# Patient Record
Sex: Male | Born: 1960
Health system: Southern US, Community
[De-identification: ages and names within clinical notes are randomized; demographics above are authoritative.]

## PROBLEM LIST (undated history)

## (undated) ENCOUNTER — Ambulatory Visit: Admission: EM | Source: Ambulatory Visit

## (undated) DIAGNOSIS — F419 Anxiety disorder, unspecified: Secondary | ICD-10-CM

## (undated) DIAGNOSIS — I10 Essential (primary) hypertension: Secondary | ICD-10-CM

## (undated) DIAGNOSIS — U071 COVID-19: Secondary | ICD-10-CM

## (undated) DIAGNOSIS — R51 Headache: Secondary | ICD-10-CM

## (undated) DIAGNOSIS — E669 Obesity, unspecified: Secondary | ICD-10-CM

## (undated) DIAGNOSIS — M1990 Unspecified osteoarthritis, unspecified site: Secondary | ICD-10-CM

## (undated) DIAGNOSIS — N189 Chronic kidney disease, unspecified: Secondary | ICD-10-CM

## (undated) DIAGNOSIS — K219 Gastro-esophageal reflux disease without esophagitis: Secondary | ICD-10-CM

## (undated) DIAGNOSIS — F5104 Psychophysiologic insomnia: Secondary | ICD-10-CM

## (undated) DIAGNOSIS — J45909 Unspecified asthma, uncomplicated: Secondary | ICD-10-CM

## (undated) DIAGNOSIS — E7800 Pure hypercholesterolemia, unspecified: Secondary | ICD-10-CM

## (undated) HISTORY — DX: Anxiety disorder, unspecified: F41.9

## (undated) HISTORY — DX: Unspecified asthma, uncomplicated: J45.909

## (undated) HISTORY — DX: Psychophysiologic insomnia: F51.04

## (undated) HISTORY — DX: Pure hypercholesterolemia, unspecified: E78.00

## (undated) HISTORY — DX: Chronic kidney disease, unspecified: N18.9

## (undated) HISTORY — DX: Headache: R51

## (undated) HISTORY — DX: Unspecified osteoarthritis, unspecified site: M19.90

## (undated) HISTORY — DX: Gastro-esophageal reflux disease without esophagitis: K21.9

## (undated) HISTORY — DX: Essential (primary) hypertension: I10

## (undated) HISTORY — DX: Obesity, unspecified: E66.9

## (undated) HISTORY — PX: SHOULDER SURGERY: SHX246

---

## 1998-06-15 ENCOUNTER — Ambulatory Visit (HOSPITAL_COMMUNITY): Admission: RE | Admit: 1998-06-15 | Discharge: 1998-06-15 | Payer: Self-pay | Admitting: Specialist

## 1998-07-03 ENCOUNTER — Ambulatory Visit (HOSPITAL_COMMUNITY): Admission: RE | Admit: 1998-07-03 | Discharge: 1998-07-03 | Payer: Self-pay | Admitting: Specialist

## 1998-07-03 ENCOUNTER — Encounter: Payer: Self-pay | Admitting: Specialist

## 2003-10-17 ENCOUNTER — Encounter: Admission: RE | Admit: 2003-10-17 | Discharge: 2003-10-18 | Payer: Self-pay | Admitting: Pulmonary Disease

## 2004-10-26 ENCOUNTER — Ambulatory Visit: Payer: Self-pay | Admitting: Pulmonary Disease

## 2004-11-19 ENCOUNTER — Ambulatory Visit: Payer: Self-pay | Admitting: Pulmonary Disease

## 2004-12-24 ENCOUNTER — Ambulatory Visit: Payer: Self-pay | Admitting: Pulmonary Disease

## 2005-12-18 ENCOUNTER — Ambulatory Visit: Payer: Self-pay | Admitting: Pulmonary Disease

## 2007-01-29 ENCOUNTER — Ambulatory Visit: Payer: Self-pay | Admitting: Pulmonary Disease

## 2007-01-29 LAB — CONVERTED CEMR LAB
ALT: 30 units/L (ref 0–53)
AST: 26 units/L (ref 0–37)
Albumin: 4.2 g/dL (ref 3.5–5.2)
Alkaline Phosphatase: 48 units/L (ref 39–117)
BUN: 12 mg/dL (ref 6–23)
Basophils Absolute: 0.1 10*3/uL (ref 0.0–0.1)
Basophils Relative: 0.8 % (ref 0.0–1.0)
Bilirubin Urine: NEGATIVE
Bilirubin, Direct: 0.1 mg/dL (ref 0.0–0.3)
CO2: 31 meq/L (ref 19–32)
Calcium: 9.4 mg/dL (ref 8.4–10.5)
Chloride: 104 meq/L (ref 96–112)
Cholesterol: 176 mg/dL (ref 0–200)
Creatinine, Ser: 1 mg/dL (ref 0.4–1.5)
Eosinophils Absolute: 0.1 10*3/uL (ref 0.0–0.6)
Eosinophils Relative: 1.7 % (ref 0.0–5.0)
GFR calc Af Amer: 104 mL/min
GFR calc non Af Amer: 86 mL/min
Glucose, Bld: 118 mg/dL — ABNORMAL HIGH (ref 70–99)
HCT: 49.3 % (ref 39.0–52.0)
HDL: 45 mg/dL (ref >39.0)
Hemoglobin, Urine: NEGATIVE
Hemoglobin: 16.9 g/dL (ref 13.0–17.0)
Ketones, ur: NEGATIVE mg/dL
LDL Cholesterol: 118 mg/dL — ABNORMAL HIGH (ref 0–99)
Leukocytes, UA: NEGATIVE
Lymphocytes Relative: 24.5 % (ref 12.0–46.0)
MCHC: 34.2 g/dL (ref 30.0–36.0)
MCV: 94.3 fL (ref 78.0–100.0)
Monocytes Absolute: 0.6 10*3/uL (ref 0.2–0.7)
Monocytes Relative: 7.3 % (ref 3.0–11.0)
Neutro Abs: 5.2 10*3/uL (ref 1.4–7.7)
Neutrophils Relative %: 65.7 % (ref 43.0–77.0)
Nitrite: NEGATIVE
Platelets: 210 10*3/uL (ref 150–400)
Potassium: 4.5 meq/L (ref 3.5–5.1)
RBC: 5.23 M/uL (ref 4.22–5.81)
RDW: 11.8 % (ref 11.5–14.6)
Sodium: 141 meq/L (ref 135–145)
Specific Gravity, Urine: 1.015 (ref 1.000–1.03)
TSH: 0.78 microintl units/mL (ref 0.35–5.50)
Total Bilirubin: 0.9 mg/dL (ref 0.3–1.2)
Total CHOL/HDL Ratio: 3.9
Total Protein, Urine: NEGATIVE mg/dL
Total Protein: 7.1 g/dL (ref 6.0–8.3)
Triglycerides: 67 mg/dL (ref 0–149)
Urine Glucose: NEGATIVE mg/dL
Urobilinogen, UA: 0.2 (ref 0.0–1.0)
VLDL: 13 mg/dL (ref 0–40)
WBC: 7.9 10*3/uL (ref 4.5–10.5)
pH: 6 (ref 5.0–8.0)

## 2007-01-31 DIAGNOSIS — E669 Obesity, unspecified: Secondary | ICD-10-CM | POA: Insufficient documentation

## 2007-01-31 DIAGNOSIS — E785 Hyperlipidemia, unspecified: Secondary | ICD-10-CM | POA: Insufficient documentation

## 2007-01-31 DIAGNOSIS — J309 Allergic rhinitis, unspecified: Secondary | ICD-10-CM | POA: Insufficient documentation

## 2007-01-31 DIAGNOSIS — K219 Gastro-esophageal reflux disease without esophagitis: Secondary | ICD-10-CM | POA: Insufficient documentation

## 2007-01-31 DIAGNOSIS — R51 Headache: Secondary | ICD-10-CM | POA: Insufficient documentation

## 2007-01-31 DIAGNOSIS — R519 Headache, unspecified: Secondary | ICD-10-CM | POA: Insufficient documentation

## 2007-01-31 DIAGNOSIS — I10 Essential (primary) hypertension: Secondary | ICD-10-CM | POA: Insufficient documentation

## 2008-04-18 ENCOUNTER — Telehealth: Payer: Self-pay | Admitting: Pulmonary Disease

## 2008-05-16 ENCOUNTER — Telehealth: Payer: Self-pay | Admitting: Pulmonary Disease

## 2008-05-18 ENCOUNTER — Telehealth: Payer: Self-pay | Admitting: Pulmonary Disease

## 2008-05-19 ENCOUNTER — Ambulatory Visit: Payer: Self-pay | Admitting: Pulmonary Disease

## 2008-05-27 ENCOUNTER — Ambulatory Visit: Payer: Self-pay | Admitting: Pulmonary Disease

## 2008-05-27 DIAGNOSIS — J209 Acute bronchitis, unspecified: Secondary | ICD-10-CM | POA: Insufficient documentation

## 2008-05-28 DIAGNOSIS — G4700 Insomnia, unspecified: Secondary | ICD-10-CM | POA: Insufficient documentation

## 2008-05-28 LAB — CONVERTED CEMR LAB
ALT: 30 units/L (ref 0–53)
AST: 17 units/L (ref 0–37)
Albumin: 3.6 g/dL (ref 3.5–5.2)
Alkaline Phosphatase: 53 units/L (ref 39–117)
BUN: 15 mg/dL (ref 6–23)
BUN: 13 mg/dL (ref 6–23)
Bacteria, UA: NEGATIVE
Basophils Absolute: 0 10*3/uL (ref 0.0–0.1)
Basophils Relative: 0 % (ref 0.0–3.0)
Bilirubin Urine: NEGATIVE
Bilirubin, Direct: 0.1 mg/dL (ref 0.0–0.3)
CO2: 34 meq/L — ABNORMAL HIGH (ref 19–32)
CO2: 33 meq/L — ABNORMAL HIGH (ref 19–32)
Calcium: 8.7 mg/dL (ref 8.4–10.5)
Calcium: 9.6 mg/dL (ref 8.4–10.5)
Chloride: 100 meq/L (ref 96–112)
Chloride: 101 meq/L (ref 96–112)
Cholesterol: 171 mg/dL (ref 0–200)
Creatinine, Ser: 1.1 mg/dL (ref 0.4–1.5)
Creatinine, Ser: 1.2 mg/dL (ref 0.4–1.5)
Crystals: NEGATIVE
Direct LDL: 97.5 mg/dL
Eosinophils Absolute: 0.2 10*3/uL (ref 0.0–0.7)
Eosinophils Relative: 1.6 % (ref 0.0–5.0)
GFR calc Af Amer: 93 mL/min
GFR calc Af Amer: 84 mL/min
GFR calc non Af Amer: 77 mL/min
GFR calc non Af Amer: 69 mL/min
Glucose, Bld: 225 mg/dL — ABNORMAL HIGH (ref 70–99)
Glucose, Bld: 122 mg/dL — ABNORMAL HIGH (ref 70–99)
HCT: 48 % (ref 39.0–52.0)
HDL: 47.8 mg/dL (ref >39.0)
Hemoglobin, Urine: NEGATIVE
Hemoglobin: 16.7 g/dL (ref 13.0–17.0)
Hgb A1c MFr Bld: 6.6 % — ABNORMAL HIGH (ref 4.6–6.0)
Ketones, ur: NEGATIVE mg/dL
Leukocytes, UA: NEGATIVE
Lymphocytes Relative: 27.9 % (ref 12.0–46.0)
MCHC: 34.8 g/dL (ref 30.0–36.0)
MCV: 94.7 fL (ref 78.0–100.0)
Monocytes Absolute: 0.7 10*3/uL (ref 0.1–1.0)
Monocytes Relative: 6 % (ref 3.0–12.0)
Neutro Abs: 8 10*3/uL — ABNORMAL HIGH (ref 1.4–7.7)
Neutrophils Relative %: 64.5 % (ref 43.0–77.0)
Nitrite: NEGATIVE
PSA: 0.51 ng/mL (ref 0.10–4.00)
Platelets: 207 10*3/uL (ref 150–400)
Potassium: 3.1 meq/L — ABNORMAL LOW (ref 3.5–5.1)
Potassium: 4.3 meq/L (ref 3.5–5.1)
RBC: 5.07 M/uL (ref 4.22–5.81)
RDW: 11.8 % (ref 11.5–14.6)
Sodium: 141 meq/L (ref 135–145)
Sodium: 141 meq/L (ref 135–145)
Specific Gravity, Urine: >=1.03 (ref 1.000–1.035)
Squamous Epithelial / HPF: NEGATIVE /lpf
TSH: 2.2 microintl units/mL (ref 0.35–5.50)
Total Bilirubin: 0.7 mg/dL (ref 0.3–1.2)
Total CHOL/HDL Ratio: 3.6
Total Protein, Urine: 30 mg/dL — AB
Total Protein: 6.3 g/dL (ref 6.0–8.3)
Triglycerides: 231 mg/dL (ref 0–149)
Urine Glucose: 250 mg/dL — AB
Urobilinogen, UA: 1 (ref 0.0–1.0)
VLDL: 46 mg/dL — ABNORMAL HIGH (ref 0–40)
WBC: 12.4 10*3/uL — ABNORMAL HIGH (ref 4.5–10.5)
pH: 6 (ref 5.0–8.0)

## 2008-06-21 ENCOUNTER — Ambulatory Visit: Payer: Self-pay | Admitting: Pulmonary Disease

## 2008-10-28 ENCOUNTER — Telehealth: Payer: Self-pay | Admitting: Pulmonary Disease

## 2009-05-10 ENCOUNTER — Telehealth (INDEPENDENT_AMBULATORY_CARE_PROVIDER_SITE_OTHER): Payer: Self-pay | Admitting: *Deleted

## 2009-05-12 ENCOUNTER — Ambulatory Visit: Payer: Self-pay | Admitting: Pulmonary Disease

## 2009-05-13 DIAGNOSIS — M1990 Unspecified osteoarthritis, unspecified site: Secondary | ICD-10-CM | POA: Insufficient documentation

## 2009-05-13 LAB — CONVERTED CEMR LAB
ALT: 29 units/L (ref 0–53)
AST: 27 units/L (ref 0–37)
Albumin: 4.1 g/dL (ref 3.5–5.2)
Alkaline Phosphatase: 50 units/L (ref 39–117)
BUN: 11 mg/dL (ref 6–23)
Basophils Absolute: 0 10*3/uL (ref 0.0–0.1)
Basophils Relative: 0.5 % (ref 0.0–3.0)
Bilirubin Urine: NEGATIVE
Bilirubin, Direct: 0.3 mg/dL (ref 0.0–0.3)
CO2: 29 meq/L (ref 19–32)
Calcium: 9.3 mg/dL (ref 8.4–10.5)
Chloride: 103 meq/L (ref 96–112)
Cholesterol: 156 mg/dL (ref 0–200)
Creatinine, Ser: 1.1 mg/dL (ref 0.4–1.5)
Eosinophils Absolute: 0.1 10*3/uL (ref 0.0–0.7)
Eosinophils Relative: 2 % (ref 0.0–5.0)
GFR calc non Af Amer: 76.2 mL/min (ref >60)
Glucose, Bld: 122 mg/dL — ABNORMAL HIGH (ref 70–99)
HCT: 46.4 % (ref 39.0–52.0)
HDL: 47.5 mg/dL (ref >39.00)
Hemoglobin, Urine: NEGATIVE
Hemoglobin: 16.1 g/dL (ref 13.0–17.0)
Hgb A1c MFr Bld: 6 % (ref 4.6–6.5)
Ketones, ur: NEGATIVE mg/dL
LDL Cholesterol: 100 mg/dL — ABNORMAL HIGH (ref 0–99)
Leukocytes, UA: NEGATIVE
Lymphocytes Relative: 31.5 % (ref 12.0–46.0)
Lymphs Abs: 1.7 10*3/uL (ref 0.7–4.0)
MCHC: 34.6 g/dL (ref 30.0–36.0)
MCV: 95.9 fL (ref 78.0–100.0)
Monocytes Absolute: 0.5 10*3/uL (ref 0.1–1.0)
Monocytes Relative: 9.5 % (ref 3.0–12.0)
Neutro Abs: 3.2 10*3/uL (ref 1.4–7.7)
Neutrophils Relative %: 56.5 % (ref 43.0–77.0)
Nitrite: NEGATIVE
PSA: 0.8 ng/mL (ref 0.10–4.00)
Platelets: 185 10*3/uL (ref 150.0–400.0)
Potassium: 4.3 meq/L (ref 3.5–5.1)
RBC: 4.84 M/uL (ref 4.22–5.81)
RDW: 11.9 % (ref 11.5–14.6)
Sodium: 140 meq/L (ref 135–145)
Specific Gravity, Urine: 1.025 (ref 1.000–1.030)
TSH: 0.74 microintl units/mL (ref 0.35–5.50)
Total Bilirubin: 0.9 mg/dL (ref 0.3–1.2)
Total CHOL/HDL Ratio: 3
Total Protein, Urine: NEGATIVE mg/dL
Total Protein: 7.1 g/dL (ref 6.0–8.3)
Triglycerides: 42 mg/dL (ref 0.0–149.0)
Urine Glucose: NEGATIVE mg/dL
Urobilinogen, UA: 1 (ref 0.0–1.0)
VLDL: 8.4 mg/dL (ref 0.0–40.0)
WBC: 5.5 10*3/uL (ref 4.5–10.5)
pH: 6 (ref 5.0–8.0)

## 2009-05-25 ENCOUNTER — Telehealth (INDEPENDENT_AMBULATORY_CARE_PROVIDER_SITE_OTHER): Payer: Self-pay | Admitting: *Deleted

## 2009-07-05 ENCOUNTER — Telehealth: Payer: Self-pay | Admitting: Pulmonary Disease

## 2009-07-07 ENCOUNTER — Telehealth: Payer: Self-pay | Admitting: Pulmonary Disease

## 2009-12-22 ENCOUNTER — Telehealth: Payer: Self-pay | Admitting: Pulmonary Disease

## 2009-12-27 ENCOUNTER — Ambulatory Visit: Payer: Self-pay | Admitting: Pulmonary Disease

## 2010-01-02 ENCOUNTER — Ambulatory Visit: Payer: Self-pay | Admitting: Pulmonary Disease

## 2010-01-02 LAB — CONVERTED CEMR LAB
ALT: 28 units/L (ref 0–53)
AST: 25 units/L (ref 0–37)
Albumin: 4.2 g/dL (ref 3.5–5.2)
Alkaline Phosphatase: 50 units/L (ref 39–117)
BUN: 19 mg/dL (ref 6–23)
Basophils Absolute: 0 10*3/uL (ref 0.0–0.1)
Basophils Relative: 0.6 % (ref 0.0–3.0)
Bilirubin, Direct: 0.1 mg/dL (ref 0.0–0.3)
CO2: 29 meq/L (ref 19–32)
Calcium: 9.3 mg/dL (ref 8.4–10.5)
Chloride: 106 meq/L (ref 96–112)
Cholesterol: 164 mg/dL (ref 0–200)
Creatinine, Ser: 1.2 mg/dL (ref 0.4–1.5)
Eosinophils Absolute: 0.1 10*3/uL (ref 0.0–0.7)
Eosinophils Relative: 1.4 % (ref 0.0–5.0)
GFR calc non Af Amer: 70.08 mL/min (ref >60)
Glucose, Bld: 97 mg/dL (ref 70–99)
HCT: 46.4 % (ref 39.0–52.0)
HDL: 40.9 mg/dL (ref >39.00)
Hemoglobin: 15.9 g/dL (ref 13.0–17.0)
LDL Cholesterol: 101 mg/dL — ABNORMAL HIGH (ref 0–99)
Lymphocytes Relative: 33.2 % (ref 12.0–46.0)
Lymphs Abs: 2.4 10*3/uL (ref 0.7–4.0)
MCHC: 34.4 g/dL (ref 30.0–36.0)
MCV: 95.6 fL (ref 78.0–100.0)
Monocytes Absolute: 0.7 10*3/uL (ref 0.1–1.0)
Monocytes Relative: 9.8 % (ref 3.0–12.0)
Neutro Abs: 3.9 10*3/uL (ref 1.4–7.7)
Neutrophils Relative %: 55 % (ref 43.0–77.0)
PSA: 0.63 ng/mL (ref 0.10–4.00)
Platelets: 204 10*3/uL (ref 150.0–400.0)
Potassium: 5.1 meq/L (ref 3.5–5.1)
RBC: 4.85 M/uL (ref 4.22–5.81)
RDW: 12.8 % (ref 11.5–14.6)
Sodium: 142 meq/L (ref 135–145)
TSH: 1.08 microintl units/mL (ref 0.35–5.50)
Total Bilirubin: 0.7 mg/dL (ref 0.3–1.2)
Total CHOL/HDL Ratio: 4
Total Protein: 6.8 g/dL (ref 6.0–8.3)
Triglycerides: 113 mg/dL (ref 0.0–149.0)
VLDL: 22.6 mg/dL (ref 0.0–40.0)
WBC: 7.1 10*3/uL (ref 4.5–10.5)

## 2010-04-24 NOTE — Progress Notes (Signed)
Summary: set up labs  Phone Note Call from Patient   Caller: Spouse-rene Tye Call For: nadel Summary of Call: wants labs set up so pt can have these done next week. [phone] Initial call taken by: Tivis Ringer, CNA,  December 22, 2009 10:46 AM  Follow-up for Phone Call        called and spoke with pt.  pt is scheduled to see SN for a f/u appt on 01/02/2010.  Pt would like to come in next Wed.  12/27/2009 to have bloodwork drawn.  Please advise if ok or not.  Pt stated it is ok to leave message on his voicemail.  Aundra Millet Reynolds LPN  December 22, 2009 10:59 AM   Additional Follow-up for Phone Call Additional follow up Details #1::        ok per SN---for pt to have labs---these are in the computer for pt for next wed   12-27-09.  pt is aware Randell Loop [facility]  December 22, 2009 12:14 PM

## 2010-04-24 NOTE — Assessment & Plan Note (Signed)
Summary: cpx/fasting/apc   CC:  Yearly CPX & f/u medical problems....  History of Present Illness: 50 y/o WM, husb of Devin Roberts, here for a follow up visit & CPX...    ~  BJY78:  seen w/ HBP and Lisinopril/Hct was increased to 20-12.5 for BP of 160/100, LDL was 118 on Zocor80/d, weight= 255#/ BMI= 38 & diet + exercise stressed to the pt...   ~  May 27, 2008:  he states that he's been treated by an [facility] for bronchitis x 81month w/ cough (dry- no sputum), coughed so hard he had streaky hemoptysis x1, denies congestion/ drainage/ URI symptoms, denies f/c/s, notes chest sore from coughing & incr SOB... given Omnicef & Pred but no better he says... he is still taking Lisinopril/Hct for his BP- we stopped the Lisinopril (ACE cough), stopped the Pred (BS was 225) & started Exforge 5-160 for the BP...  ~  June 21, 2008:  he reports much improved off the ACE & on the Exforge 5-160 daily... cough is gone and BP= 142/82... weight 253# is down 15# in 3 weeks!!! [name]!!!... fingerstick BS= 110, prev A1c= 6.6.Marland Kitchen.    ~  May 12, 2009:  Yearly CPX feeling well- no new complaints or concerns... brings note from wife- wants him to have ZPak & Tussionex on hand, wants CXR & EKG & lab work faxed to her at [phone]... his BP is borderline here but he thinks "white coat" & reports BP OK at home by wife... Chol & BS look good on current meds.    Current Problems:   ALLERGIC RHINITIS (ICD-477.9) - he uses ALLEGRA 180mg /d as needed...  ASTHMATIC BRONCHITIS, ACUTE (ICD-466.0) - he has had exercise-induced asthma in the past... no regular meds required... he denies cough, phlegm, dyspnea, CP, etc...  ~  2/10:  treated at [facility] for "asthmatic bronchits" w/ Omnicef & Pred... BS incr to 225, & cough did not resolve... Lisinopril was stopped w/ resolution of cough.  HYPERTENSION (ICD-401.9) - on AMLODIPINE 5mg /d, & DIOVAN 160mg /d... prev Lisinopril stopped in 2010 due to ACE cough & given Exforge w/ control of BP  but insurance wouldn't cover it- swatched to Diovan+Amlodipine... BP= 160/90 today but he thinks "white coat" w/ better BP checks at home by wife... tolerates meds well- denies HA, fatigue, visual changes, CP, palipit, dizziness, syncope, dyspnea, edema, etc...   ~  2/11: rec> continue current doses, monitor BP at home, call if BP >150/90 so we can adjust doses.  HYPERCHOLESTEROLEMIA (ICD-272.0) - on PRAVASTATIN 40mg /d since 2009 (prev on Simva80 but intol w/ leg cramps).  ~  FLP 8/06 on diet showed TChol 232, TG 117, HDL28, LDL 168... Simva80 started.  ~  FLP 9/07 on Simva80 showed TChol 149, TG 41, HDL 43, LDL 98  ~  FLP 11/08 on Simva80 showed TChol 176, TG 67, HDL 45, LDL 118  ~  FLP 2/10 on Prav40 showed TChol 171, TG 231, HDL 48, LDL 98... rec- same med, better low fat diet.  ~  FLP 2/11 on Prav40showed TChol 156, TG 42, HDL 48, LDL 100  DIABETES MELLITUS (ICD-250.00) - new dx 2/10 w/ labs showing FBS= 225 (on Pred from [facility])... recheck off Pred 3/10 showed BS= 122, HgA1c= 6.6.Marland KitchenMarland Kitchen rec- diet + exercise, get weight down!  ~  NOTE:  prev FBS's from 2000-2008 were 95 - 131...  ~  labs 2/11 showed BS= 122, A1c= 6.0.Marland KitchenMarland Kitchen needs weight reduction.  OBESITY (ICD-278.00) - he was referred to [facility]  in 2005 for diet counselling, but he never went...  ~  11/08 weight = 255#,  5\' 8"  tall,  BMI= 38...  ~  3/10 weight = 268# & lost to 253# in 3 weeks!  ~  2/11 weight = 253#... he needs to do better!  GERD (ICD-530.81) - hx reflux symptoms in the past... prev Rx w/ OTC antacids, H2 blockers, etc... he saw DrByers in 2002 for hoarseness- believed related to reflux... no previous work up and no active symptoms at present...  DEGENERATIVE JOINT DISEASE (ICD-715.90) - he's had pain in shoulders & takes IBUPROFEN 600mg  Tid...  HEADACHE (ICD-784.0) - he saw DrAdelman in 1990 for muscle contraction HA's...  INSOMNIA, CHRONIC (ICD-307.42) - he requires AMBIEN CR 12.5mg  for  rest...   Allergies: 1)  ! Zocor (Simvastatin) 2)  ! Lisinopril (Lisinopril)  Comments:  Nurse/Medical Assistant: The patient's medications and allergies were reviewed with the patient and were updated in the Medication and Allergy Lists.  Past History:  Past Medical History:  ALLERGIC RHINITIS (ICD-477.9) ASTHMATIC BRONCHITIS, ACUTE (ICD-466.0) HYPERTENSION (ICD-401.9) HYPERCHOLESTEROLEMIA (ICD-272.0) DIABETES MELLITUS (ICD-250.00) OBESITY (ICD-278.00) GERD (ICD-530.81) DEGENERATIVE JOINT DISEASE (ICD-715.90) HEADACHE (ICD-784.0) INSOMNIA, CHRONIC (ICD-307.42)  Family History: Reviewed history from 05/27/2008 and no changes required. Father died age 3 w/ lung cancer, hx COPD... Mother alive age 61 w/ hyperchol 1 Sibling- sister w/ allergies & HA's  Social History: Reviewed history from 05/27/2008 and no changes required. Married- wife Devin Roberts, 49yrs no children never smoked no alcohol works for [employer]  Review of Systems  The patient denies fever, chills, sweats, anorexia, fatigue, weakness, malaise, weight loss, sleep disorder, blurring, diplopia, eye irritation, eye discharge, vision loss, eye pain, photophobia, earache, ear discharge, tinnitus, decreased hearing, nasal congestion, nosebleeds, sore throat, hoarseness, chest pain, palpitations, syncope, dyspnea on exertion, orthopnea, PND, peripheral edema, cough, dyspnea at rest, excessive sputum, hemoptysis, wheezing, pleurisy, nausea, vomiting, diarrhea, constipation, change in bowel habits, abdominal pain, melena, hematochezia, jaundice, gas/bloating, indigestion/heartburn, dysphagia, odynophagia, dysuria, hematuria, urinary frequency, urinary hesitancy, nocturia, incontinence, back pain, joint pain, joint swelling, muscle cramps, muscle weakness, stiffness, arthritis, sciatica, restless legs, leg pain at night, leg pain with exertion, rash, itching, dryness, suspicious lesions, paralysis, paresthesias, seizures,  tremors, vertigo, transient blindness, frequent falls, frequent headaches, difficulty walking, depression, anxiety, memory loss, confusion, cold intolerance, heat intolerance, polydipsia, polyphagia, polyuria, unusual weight change, abnormal bruising, bleeding, enlarged lymph nodes, urticaria, allergic rash, hay fever, and recurrent infections.    Vital Signs:  Patient profile:   50 year old male Height:      68 inches Weight:      253 pounds BMI:     38.61 O2 Sat:      97 % on Room air Temp:     97.8 degrees F oral Pulse rate:   70 / minute BP sitting:   160 / 90  (left arm) Cuff size:   regular  Vitals Entered By: Randell Loop CMA (May 12, 2009 10:35 AM)  O2 Sat at Rest %:  97 O2 Flow:  Room air CC: Yearly CPX & f/u medical problems... Is Patient Diabetic? No Pain Assessment Patient in pain? no      Comments MEDS UPDATED TODAY   Physical Exam  Additional Exam:  WD, Obese, 50 y/o WM in NAD... GENERAL:  Alert & oriented; pleasant & cooperative... HEENT:  [place]/AT, EOM-wnl, PERRLA, EACs-clear, TMs-wnl, NOSE-clear, THROAT-clear & wnl. NECK:  Supple w/ full ROM; no JVD; normal carotid impulses w/o bruits; no thyromegaly or nodules palpated;  no lymphadenopathy. CHEST:  Clear to P & A; without wheezes/ rales/ or rhonchi. HEART:  Regular Rhythm; without murmurs/ rubs/ or gallops. ABDOMEN:  Soft & nontender; normal bowel sounds; no organomegaly or masses detected. EXT: without deformities or arthritic changes; no varicose veins/ venous insuffic/ or edema. NEURO:  CN's intact;  no focal neuro deficits... DERM:  No lesions noted; no rash etc...    CXR  Procedure date:  05/12/2009  Findings:      CHEST - 2 VIEW Comparison: 05/27/2008   Findings: The cardiac silhouette, mediastinal and hilar contours are within normal limits and stable. The lungs are clear.  No pleural effusions. The bony thorax is intact.   IMPRESSION: Normal chest x-ray.  No change since prior  study.   Read By:  Cyndie Chime,  M.D.   EKG  Procedure date:  05/12/2009  Findings:      Normal sinus rhythm with rate of:  64/min... Tracing is WNL, NAD...  SN   MISC. Report  Procedure date:  05/12/2009  Findings:      Lipid Panel (LIPID)   Cholesterol               156 mg/dL                   1-610   Triglycerides             42.0 mg/dL                  9.6-045.4   HDL                       09.81 mg/dL                 >19.14   LDL Cholesterol      [H]  782 mg/dL                   9-56  Hepatic/Liver Function Panel (HEPATIC)   Total Bilirubin           0.9 mg/dL                   2.1-3.0   Direct Bilirubin          0.3 mg/dL                   8.6-5.7   Alkaline Phosphatase      50 U/L                      39-117   AST                       27 U/L                      0-37   ALT                       29 U/L                      0-53   Total Protein             7.1 g/dL                    8.4-6.9   Albumin  4.1 g/dL                    1.6-1.0  BMP (METABOL)   Sodium                    140 mEq/L                   135-145   Potassium                 4.3 mEq/L                   3.5-5.1   Chloride                  103 mEq/L                   96-112   Carbon Dioxide            29 mEq/L                    19-32   Glucose              [H]  122 mg/dL                   96-04   BUN                       11 mg/dL                    5-40   Creatinine                1.1 mg/dL                   9.8-1.1   Calcium                   9.3 mg/dL                   9.1-47.8   GFR                       76.20 mL/min                >60  Tests: (3) Hemoglobin A1C (A1C)   Hemoglobin A1C            6.0 %                       4.6-6.5  Comments:      TSH (TSH)   FastTSH                   0.74 uIU/mL                 0.35-5.50  CBC Platelet w/Diff (CBCD)   White Cell Count          5.5 K/uL                    4.5-10.5   Red Cell Count            4.84 Mil/uL                  4.22-5.81   Hemoglobin                16.1 g/dL  13.0-17.0   Hematocrit                46.4 %                      39.0-52.0   MCV                       95.9 fl                     78.0-100.0   Platelet Count            185.0 K/uL                  150.0-400.0   Neutrophil %              56.5 %                      43.0-77.0   Lymphocyte %              31.5 %                      12.0-46.0   Monocyte %                9.5 %                       3.0-12.0   Eosinophils%              2.0 %                       0.0-5.0   Basophils %               0.5 %                       0.0-3.0  UDip Only (UDIP)   Color                     YELLOW   Clarity                   CLEAR                       Clear   Specific Gravity          1.025                       1.000 - 1.030   Urine Ph                  6.0                         5.0-8.0   Protein                   NEGATIVE                    Negative   Urine Glucose             NEGATIVE                    Negative   Ketones                   NEGATIVE  Negative   Urine Bilirubin           NEGATIVE                    Negative   Blood                     NEGATIVE                    Negative   Urobilinogen              1.0                         0.0 - 1.0   Leukocyte Esterace        NEGATIVE                    Negative   Nitrite                   NEGATIVE                    Negative      Impression & Recommendations:  Problem # 1:  PHYSICAL EXAMINATION (ICD-V70.0)  Orders: EKG w/ Interpretation (93000) T-2 View CXR (71020TC) Full labs done FASTING prior to OV...  Problem # 2:  ASTHMATIC BRONCHITIS, ACUTE (ICD-466.0) No prob-  breathing normally... wife wants ZPak & Tussionex just in case... His updated medication list for this problem includes:    Zithromax Z-pak 250 Mg Tabs (Azithromycin) .Marland Kitchen... Take as directed...    Tussionex Pennkinetic Er 8-10 Mg/47ml Lqcr (Chlorpheniramine-hydrocodone) .Marland Kitchen... 1 tsp every  12 h as needed for cough  Problem # 3:  HYPERTENSION (ICD-401.9) Borderline control-  her understands that he must lose weight... continue current Rx, monitor BP at home, call for incr doses if BP >150/90. His updated medication list for this problem includes:    Amlodipine Besylate 5 Mg Tabs (Amlodipine besylate) .Marland Kitchen... Take one tablet by mouth once daily    Diovan 160 Mg Tabs (Valsartan) .Marland Kitchen... Take 1 tablet by mouth once a day  Problem # 4:  HYPERCHOLESTEROLEMIA (ICD-272.0) Improved & satis on the Prav40 & tol well... His updated medication list for this problem includes:    Pravastatin Sodium 40 Mg Tabs (Pravastatin sodium) .Marland Kitchen... Take one tablet by mouth at bedtime  Problem # 5:  DIABETES MELLITUS (ICD-250.00) Improved & OK on diet alone... needs to get weight down!!! His updated medication list for this problem includes:    Diovan 160 Mg Tabs (Valsartan) .Marland Kitchen... Take 1 tablet by mouth once a day  Problem # 6:  OBESITY (ICD-278.00) Weoght reduction is key!!!  Problem # 7:  OTHER MEDICAL PROBLEMS AS NOTED>>> Meds refilled per request...  Complete Medication List: 1)  Allegra 180 Mg Tabs (Fexofenadine hcl) .... Take 1 tab by mouth once daily as needed for allergies.Marland KitchenMarland Kitchen 2)  Amlodipine Besylate 5 Mg Tabs (Amlodipine besylate) .... Take one tablet by mouth once daily 3)  Diovan 160 Mg Tabs (Valsartan) .... Take 1 tablet by mouth once a day 4)  Pravastatin Sodium 40 Mg Tabs (Pravastatin sodium) .... Take one tablet by mouth at bedtime 5)  Ibuprofen 600 Mg Tabs (Ibuprofen) .... Take 1 tab by mouth up to three times daily w/ food as needed for shoulder pain.Marland KitchenMarland Kitchen 6)  Ambien Cr 12.5 Mg Cr-tabs (Zolpidem tartrate) .... Take 1 tab by mouth at bedtime as needed for insomnia.Marland KitchenMarland Kitchen  7)  Zithromax Z-pak 250 Mg Tabs (Azithromycin) .... Take as directed... 8)  Tussionex Pennkinetic Er 8-10 Mg/71ml Lqcr (Chlorpheniramine-hydrocodone) .Marland Kitchen.. 1 tsp every 12 h as needed for cough  Other Orders: Prescription  Created Electronically [id number])  Patient Instructions: 1)  Today we updated your med list- see below.... 2)  We refilled your meds per request & included ZPak & Tussionex for URI & Cough... 3)  Today we did your follow up CXR, EKG, & fasting blood work... we will fax copies to you per your request... 4)  Call for any questions.Marland KitchenMarland Kitchen 5)  [place], you need to get on track w/ your diet & exercise program... the goal is to lose 15-20 lbs... 6)  Please schedule a follow-up appointment in 1 year, sooner as needed. Prescriptions: TUSSIONEX PENNKINETIC ER 8-10 MG/5ML LQCR (CHLORPHENIRAMINE-HYDROCODONE) 1 tsp every 12 H as needed for cough  #4 oz x 2   Entered and Authorized by:   Michele Mcalpine MD   Signed by:   Michele Mcalpine MD on 05/12/2009   Method used:   Print then Give to Patient   RxID:   [id number] ZITHROMAX Z-PAK 250 MG TABS (AZITHROMYCIN) take as directed...  #1 pack x 2   Entered and Authorized by:   Michele Mcalpine MD   Signed by:   Michele Mcalpine MD on 05/12/2009   Method used:   Print then Give to Patient   RxID:   [id number] AMLODIPINE BESYLATE 5 MG TABS (AMLODIPINE BESYLATE) take one tablet by mouth once daily  #30 x prn   Entered and Authorized by:   Michele Mcalpine MD   Signed by:   Michele Mcalpine MD on 05/12/2009   Method used:   Print then Give to Patient   RxID:   [id number] AMBIEN CR 12.5 MG CR-TABS (ZOLPIDEM TARTRATE) take 1 tab by mouth at bedtime as needed for insomnia...  #30 x prn   Entered and Authorized by:   Michele Mcalpine MD   Signed by:   Michele Mcalpine MD on 05/12/2009   Method used:   Print then Give to Patient   RxID:   [id number] IBUPROFEN 600 MG TABS (IBUPROFEN) take 1 tab by mouth up to three times daily w/ food as needed for shoulder pain...  #100 x prn   Entered and Authorized by:   Michele Mcalpine MD   Signed by:   Michele Mcalpine MD on 05/12/2009   Method used:   Print then Give to Patient   RxID:   [id number] PRAVASTATIN SODIUM 40 MG  TABS (PRAVASTATIN SODIUM) take one tablet by mouth at bedtime  #30 x prn   Entered and Authorized by:   Michele Mcalpine MD   Signed by:   Michele Mcalpine MD on 05/12/2009   Method used:   Print then Give to Patient   RxID:   [id number] DIOVAN 160 MG TABS (VALSARTAN) Take 1 tablet by mouth once a day  #30 x prn   Entered and Authorized by:   Michele Mcalpine MD   Signed by:   Michele Mcalpine MD on 05/12/2009   Method used:   Print then Give to Patient   RxID:   [id number] ALLEGRA 180 MG  TABS (FEXOFENADINE HCL) take 1 tab by mouth once daily as needed for allergies...  #30 x prn   Entered and Authorized by:   Michele Mcalpine MD   Signed by:   Lonzo Cloud  Kriste Basque MD on 05/12/2009   Method used:   Print then Give to Patient   RxID:   [id number]    CardioPerfect ECG  ID: [id number] Patient: KUPONO, MARLING DOB: 08/04/1960 Age: 50 Years Old Sex: Male Race: White Physician: scott nadel Technician: Randell Loop CMA Height: 68 Weight: 253 Status: Unconfirmed Past Medical History:  ALLERGIC RHINITIS (ICD-477.9) ASTHMATIC BRONCHITIS, ACUTE (ICD-466.0) HYPERTENSION (ICD-401.9) HYPERCHOLESTEROLEMIA (ICD-272.0) DIABETES MELLITUS (ICD-250.00) OBESITY (ICD-278.00) GERD (ICD-530.81) HEADACHE (ICD-784.0) INSOMNIA, CHRONIC (ICD-307.42)   Recorded: 05/12/2009 10:54 AM P/PR: 110 ms / 178 ms - Heart rate (maximum exercise) QRS: 79 QT/QTc/QTd: 385 ms / 390 ms / 30 ms - Heart rate (maximum exercise)  P/QRS/T axis: 30 deg / 45 deg / 56 deg - Heart rate (maximum exercise)  Heartrate: 63 bpm  Interpretation:  Normal sinus rhythm with rate of:  64/min... Tracing is WNL, NAD...  SN

## 2010-04-24 NOTE — Progress Notes (Signed)
Summary: set up labs  Phone Note Call from Patient Call back at Home Phone [phone]   Caller: Patient Call For: nadel Summary of Call: pt wants to make sure labs are set up so he can come in fri at 7:30.  Initial call taken by: Tivis Ringer, CNA,  May 10, 2009 8:48 AM  Follow-up for Phone Call        Pt want to get labs drawn on friday morning for upcoming CPX. Please advise what labs to place. Thanks. Carron Curie CMA  May 10, 2009 8:56 AM  labs placed in IDX for friday morning. Boone Master CNA  May 10, 2009 9:05 AM   Additional Follow-up for Phone Call Additional follow up Details #1::        [facility] letting patient know labs have been ordered for friday morning and to come fasting.Michel Bickers [facility]  May 10, 2009 9:19 AM

## 2010-04-24 NOTE — Progress Notes (Signed)
Summary: sick  Phone Note Call from Patient Call back at [facility] Phone [phone]   Caller: Patient Call For: nadel Reason for Call: Talk to Nurse Summary of Call: pt temp 100.2, diarrhea.  Has taken imodium and tylenol.Marland KitchenMarland KitchenDoes he need to take anything else?  Still has Diarrhea. Initial call taken by: Eugene Gavia,  July 05, 2009 8:20 AM  Follow-up for Phone Call        The pt c/o temp yesterday and diarrhea that has improved this morning with Immodium. Pt having 3-4 loose BM's about every 3 hours or so. No nausea vomiting or severe stomach cramping. Please advise.Michel Bickers [facility]  July 05, 2009 10:06 AM  Additional Follow-up for Phone Call Additional follow up Details #1::        per SN---rec use the align once daily and activa yogurt daily----called and spoke with pt and he is aware of SN recs.  will try this and if any other problems to call back. Randell Loop CMA  July 05, 2009 11:15 AM

## 2010-04-24 NOTE — Assessment & Plan Note (Signed)
Summary: f/u ///kp   CC:  8 month ROV & review of mult medical problems....  History of Present Illness: 50 y/o WM, husb of Krayton Wortley, here for a follow up visit...   ~  ZOX09:  seen w/ HBP and his Lisinopril/Hct was increased to 20-12.5 for BP of 160/100, LDL was 118 on Zocor80/d, weight= 255#/ BMI= 38 & diet + exercise stressed to the pt...   ~  Mar10:  he states that he's been treated by an [facility] for bronchitis x 34month w/ cough (dry- no sputum), coughed so hard he had streaky hemoptysis x1, denies congestion/ drainage/ URI symptoms, denies f/c/s, notes chest sore from coughing & incr SOB... given Omnicef & Pred but no better he says... he is still taking Lisinopril/Hct for his BP- we stopped the Lisinopril (ACE cough), stopped the Pred (BS was 225) & started Exforge 5-160 for the BP...  ~  Mar10:  he reports much improved off the ACE & on the Exforge 5-160 daily... cough is gone and BP= 142/82... weight 253# is down 15# in 3 weeks!!! [name]!!!... fingerstick BS= 110, prev A1c= 6.6.Marland Kitchen.    ~  May 12, 2009:  Yearly CPX feeling well- no new complaints or concerns... brings note from wife- wants him to have ZPak & Tussionex on hand, wants CXR & EKG & lab work faxed to her at [phone]... his BP is borderline here but he thinks "white coat" & reports BP OK at home by wife... Chol & BS look good on current meds.   ~  January 02, 2010:  59mo ROV &recent labs reviewed- looks good on diet, exercise, Prav40, + his BP meds (see below)... he requests copy of labs for wife, and Flu shot today...    Current Problems:   ALLERGIC RHINITIS (ICD-477.9) - he uses ALLEGRA 180mg /d as needed...  ASTHMATIC BRONCHITIS, ACUTE (ICD-466.0) - he has had exercise-induced asthma in the past... no regular meds required... he denies cough, phlegm, dyspnea, CP, etc...  ~  2/10:  treated at [facility] for "asthmatic bronchits" w/ Omnicef & Pred... BS incr to 225, & cough did not resolve... Lisinopril was stopped w/  resolution of cough.  HYPERTENSION (ICD-401.9) - on AMLODIPINE 5mg /d, & DIOVAN 160mg /d... prev Lisinopril stopped in 2010 due to ACE cough & given Exforge w/ control of BP but insurance wouldn't cover it- swatched to Diovan+Amlodipine... BP= 140/90 today but he thinks "white coat" w/ better BP checks at home in the 120-130/ 80 range... tolerates meds well- denies HA, fatigue, visual changes, CP, palipit, dizziness, syncope, dyspnea, edema, etc...   HYPERCHOLESTEROLEMIA (ICD-272.0) - on PRAVASTATIN 40mg /d since 2009 (prev on Simva80 but intol w/ leg cramps).  ~  FLP 8/06 on diet showed TChol 232, TG 117, HDL28, LDL 168... Simva80 started.  ~  FLP 9/07 on Simva80 showed TChol 149, TG 41, HDL 43, LDL 98  ~  FLP 11/08 on Simva80 showed TChol 176, TG 67, HDL 45, LDL 118  ~  FLP 2/10 on Prav40 showed TChol 171, TG 231, HDL 48, LDL 98... rec- same med, better low fat diet.  ~  FLP 2/11 on Prav40showed TChol 156, TG 42, HDL 48, LDL 100  ~  FLP 10/11 showed TChol 164, TG 113, HDL 41, LDL 101  DIABETES MELLITUS (ICD-250.00) - new dx 2/10 w/ labs showing FBS= 225 (on Pred from [facility])... recheck off Pred 3/10 showed BS= 122, HgA1c= 6.6.Marland KitchenMarland Kitchen rec- diet + exercise, get weight down!  ~  NOTE:  prev FBS's  from 2000-2008 were 95 - 131  ~  labs 2/11 showed BS= 122, A1c= 6.0.Marland KitchenMarland Kitchen needs weight reduction.  ~  labs 10/11 on diet alone showed BS= 97  OBESITY (ICD-278.00) - he was referred to [facility] in 2005 for diet counselling, but he never went... finally got serious about wt reduction in 2010 & steadily improving ever since.  ~  11/08 weight = 255#,  5\' 8"  tall,  BMI= 38...  ~  3/10 weight = 268# & lost to 253# in 3 weeks!  ~  2/11 weight = 253#... he needs to do better!  ~  10/11 weight = 243#  GERD (ICD-530.81) - hx reflux symptoms in the past... prev Rx w/ OTC antacids, H2 blockers, etc... he saw DrByers in 2002 for hoarseness- believed related to reflux... no previous work up and no active symptoms at  present...  DEGENERATIVE JOINT DISEASE (ICD-715.90) - he's had pain in shoulders & takes IBUPROFEN 600mg  Tid...  HEADACHE (ICD-784.0) - he saw DrAdelman in 1990 for muscle contraction HA's...  INSOMNIA, CHRONIC (ICD-307.42) - he requires AMBIEN 10mg  for rest...   Preventive Screening-Counseling & Management  Alcohol-Tobacco     Smoking Status: never  Allergies: 1)  ! Zocor (Simvastatin) 2)  ! Lisinopril (Lisinopril)  Comments:  Nurse/Medical Assistant: The patient's medications and allergies were reviewed with the patient and were updated in the Medication and Allergy Lists.  Past History:  Past Medical History: ALLERGIC RHINITIS (ICD-477.9) ASTHMATIC BRONCHITIS, ACUTE (ICD-466.0) HYPERTENSION (ICD-401.9) HYPERCHOLESTEROLEMIA (ICD-272.0) DIABETES MELLITUS (ICD-250.00) OBESITY (ICD-278.00) GERD (ICD-530.81) DEGENERATIVE JOINT DISEASE (ICD-715.90) HEADACHE (ICD-784.0) INSOMNIA, CHRONIC (ICD-307.42)  Family History: Reviewed history from 05/27/2008 and no changes required. Father died age 70 w/ lung cancer, hx COPD... Mother alive age 44 w/ hyperchol 1 Sibling- sister w/ allergies & HA's  Social History: Reviewed history from 05/27/2008 and no changes required. Married- wife Luster Landsberg, 63yrs no children never smoked no alcohol works for [employer]  Review of Systems      See HPI  The patient denies anorexia, fever, weight loss, weight gain, vision loss, decreased hearing, hoarseness, chest pain, syncope, dyspnea on exertion, peripheral edema, prolonged cough, headaches, hemoptysis, abdominal pain, melena, hematochezia, severe indigestion/heartburn, hematuria, incontinence, muscle weakness, suspicious skin lesions, transient blindness, difficulty walking, depression, unusual weight change, abnormal bleeding, enlarged lymph nodes, and angioedema.    Vital Signs:  Patient profile:   50 year old male Height:      68 inches Weight:      243 pounds BMI:      37.08 O2 Sat:      97 % on Room air Temp:     976 degrees F oral Pulse rate:   64 / minute BP sitting:   140 / 90  (right arm) Cuff size:   regular  Vitals Entered By: Randell Loop CMA (January 02, 2010 12:10 PM)  O2 Sat at Rest %:  97 O2 Flow:  Room air CC: 8 month ROV & review of mult medical problems... Is Patient Diabetic? No Pain Assessment Patient in pain? no      Comments meds updated today with pt   Physical Exam  Additional Exam:  WD, Obese, 50 y/o WM in NAD... GENERAL:  Alert & oriented; pleasant & cooperative... HEENT:  [place]/AT, EOM-wnl, PERRLA, EACs-clear, TMs-wnl, NOSE-clear, THROAT-clear & wnl. NECK:  Supple w/ full ROM; no JVD; normal carotid impulses w/o bruits; no thyromegaly or nodules palpated; no lymphadenopathy. CHEST:  Clear to P & A; without wheezes/ rales/ or  rhonchi. HEART:  Regular Rhythm; without murmurs/ rubs/ or gallops. ABDOMEN:  Soft & nontender; normal bowel sounds; no organomegaly or masses detected. EXT: without deformities or arthritic changes; no varicose veins/ venous insuffic/ or edema. NEURO:  CN's intact;  no focal neuro deficits... DERM:  No lesions noted; no rash etc...    MISC. Report  Procedure date:  12/27/2009  Findings:      BMP (METABOL)   Sodium                    142 mEq/L                   135-145   Potassium                 5.1 mEq/L                   3.5-5.1   Chloride                  106 mEq/L                   96-112   Carbon Dioxide            29 mEq/L                    19-32   Glucose                   97 mg/dL                    16-10   BUN                       19 mg/dL                    9-60   Creatinine                1.2 mg/dL                   4.5-4.0   Calcium                   9.3 mg/dL                   9.8-11.9   GFR                       70.08 mL/min                >60  Lipid Panel (LIPID)   Cholesterol               164 mg/dL                   1-478   Triglycerides             113.0 mg/dL                  2.9-562.1   HDL                       30.86 mg/dL                 >57.84   LDL Cholesterol      [H]  696 mg/dL                   2-95  CBC Platelet w/Diff (CBCD)   White Cell Count          7.1 K/uL                    4.5-10.5   Red Cell Count            4.85 Mil/uL                 4.22-5.81   Hemoglobin                15.9 g/dL                   03.4-74.2   Hematocrit                46.4 %                      39.0-52.0   MCV                       95.6 fl                     78.0-100.0  Platelet Count            204.0 K/uL                  150.0-400.0   Neutrophil %              55.0 %                      43.0-77.0   Lymphocyte %              33.2 %                      12.0-46.0   Monocyte %                9.8 %                       3.0-12.0   Eosinophils%              1.4 %                       0.0-5.0   Basophils %               0.6 %                       0.0-3.0  Comments:      Hepatic/Liver Function Panel (HEPATIC)   Total Bilirubin           0.7 mg/dL                   5.9-5.6   Direct Bilirubin          0.1 mg/dL                   3.8-7.5   Alkaline Phosphatase      50 U/L                      39-117   AST                       25 U/L  0-37   ALT                       28 U/L                      0-53   Total Protein             6.8 g/dL                    1.6-1.0   Albumin                   4.2 g/dL                    9.6-0.4  Tests: (5) TSH (TSH)   FastTSH                   1.08 uIU/mL                 0.35-5.50  Tests: (6) Prostate Specific Antigen (PSA)   PSA-Hyb                   0.63 ng/mL                  0.10-4.00   Impression & Recommendations:  Problem # 1:  ASTHMATIC BRONCHITIS, ACUTE (ICD-466.0) No recurrent problem>  stable. The following medications were removed from the medication list:    Zithromax Z-pak 250 Mg Tabs (Azithromycin) .Marland Kitchen... Take as directed...    Tussionex Pennkinetic Er 8-10 Mg/47ml Lqcr  (Chlorpheniramine-hydrocodone) .Marland Kitchen... 1 tsp every 12 h as needed for cough  Problem # 2:  HYPERTENSION (ICD-401.9) Controlled on meds>  improved control w/ weight reduction. His updated medication list for this problem includes:    Amlodipine Besylate 5 Mg Tabs (Amlodipine besylate) .Marland Kitchen... Take one tablet by mouth once daily    Diovan 160 Mg Tabs (Valsartan) .Marland Kitchen... Take 1 tablet by mouth once a day  Problem # 3:  HYPERCHOLESTEROLEMIA (ICD-272.0) FLP stable on the Prav40... His updated medication list for this problem includes:    Pravastatin Sodium 40 Mg Tabs (Pravastatin sodium) .Marland Kitchen... Take one tablet by mouth at bedtime  Problem # 4:  DIABETES MELLITUS (ICD-250.00) BS normal now w/ diet + exercise... His updated medication list for this problem includes:    Diovan 160 Mg Tabs (Valsartan) .Marland Kitchen... Take 1 tablet by mouth once a day  Problem # 5:  OBESITY (ICD-278.00) Nice job w/ weight reduction!  Problem # 6:  OTHER MEDICAL PROBLEMS AS NOTED>>> OK Flu shot... refill Ambien Rx.  Complete Medication List: 1)  Allegra 180 Mg Tabs (Fexofenadine hcl) .... Take 1 tab by mouth once daily as needed for allergies.Marland KitchenMarland Kitchen 2)  Amlodipine Besylate 5 Mg Tabs (Amlodipine besylate) .... Take one tablet by mouth once daily 3)  Diovan 160 Mg Tabs (Valsartan) .... Take 1 tablet by mouth once a day 4)  Pravastatin Sodium 40 Mg Tabs (Pravastatin sodium) .... Take one tablet by mouth at bedtime 5)  Ibuprofen 600 Mg Tabs (Ibuprofen) .... Take 1 tab by mouth up to three times daily w/ food as needed for shoulder pain.Marland KitchenMarland Kitchen 6)  Ambien Cr 12.5 Mg Cr-tabs (Zolpidem tartrate) .... Take 1 tab by mouth at bedtime as needed for insomnia.Marland KitchenMarland Kitchen 7)  Zolpidem Tartrate 10 Mg Tabs (Zolpidem tartrate) .Marland Kitchen.. 1 by mouth at bedtime  Other Orders: Admin 1st Vaccine (54098) Flu Vaccine 46yrs + (11914)  Patient  Instructions: 1)  Today we updated your med list- see below.... 2)  We refilled your Ambien today... 3)  We gave you a copy of  your recent labs...  4)  Keep up the great job w/ weight reduction!!! U DA MAN!!! 5)  Call for any problems... Prescriptions: ZOLPIDEM TARTRATE 10 MG TABS (ZOLPIDEM TARTRATE) 1 by mouth at bedtime  #30 x 6   Entered and Authorized by:   Michele Mcalpine MD   Signed by:   Michele Mcalpine MD on 01/02/2010   Method used:   Print then Give to Patient   RxID:   [id number]    Immunization History:  Influenza Immunization History:    Influenza:  historical (01/13/2008)  Flu Vaccine Consent Questions     Do you have a history of severe allergic reactions to this vaccine? no    Any prior history of allergic reactions to egg and/or gelatin? no    Do you have a sensitivity to the preservative Thimersol? no    Do you have a past history of Guillan-Barre Syndrome? no    Do you currently have an acute febrile illness? no    Have you ever had a severe reaction to latex? no    Vaccine information given and explained to patient? yes    Are you currently pregnant? no    Lot Number:[id number]   Exp Date:09/22/2010   Site Given  Left Deltoid IMlbflu   Randell Loop [facility]  January 02, 2010 12:44 PM

## 2010-04-24 NOTE — Progress Notes (Signed)
Summary: still sick  Phone Note Call from Patient Call back at [phone]   Caller: Myrtha Mantis Reason for Call: Talk to Nurse Summary of Call: not running temp now, still has diarrhea, stomach cramps gas pain.  Please advise Initial call taken by: Eugene Gavia,  July 07, 2009 9:21 AM  Follow-up for Phone Call        The pt did try Align per wife and did take Immodium yesterday for diarrhea. Last night the pt had severe cramping and excessive gas until 2am this morning. I called and also spoke with the pt this morning and he says he feels a lot better no but has not tried eating this morning. I told him to call if the diarrhea, cramping or excessive gas returns. I told him that I would forward this msg to SN to see if there were any additional recs. Follow-up by: Michel Bickers CMA,  July 07, 2009 9:36 AM  Additional Follow-up for Phone Call Additional follow up Details #1::        per SN---use the gas x or mylicon four times daily and phyazyme four times daily.  for the cramping rx for bentyl  20 mg   #50  1 by mouth four times daily as needed for abd cramping,  called and spoke with pts wife and she is aware of SN recs Randell Loop CMA  July 07, 2009 5:08 PM     New/Updated Medications: BENTYL 20 MG TABS (DICYCLOMINE HCL) take one tablet by mouth four times daily as needed for abd cramping Prescriptions: BENTYL 20 MG TABS (DICYCLOMINE HCL) take one tablet by mouth four times daily as needed for abd cramping  #50 x 1   Entered by:   Randell Loop CMA   Authorized by:   Michele Mcalpine MD   Signed by:   Randell Loop CMA on 07/07/2009   Method used:   Electronically to        [facility]  [address] [phone]* (retail)       [address]       [place], [place]  [address]       Ph: [phone] or [phone]       Fax: [phone]   RxID:   [id number]

## 2010-04-24 NOTE — Progress Notes (Signed)
Summary: Ambien  Phone Note Call from Patient Call back at [phone] or [phone] after 3   Caller: Renee - Spouse Call For: Kriste Basque Reason for Call: Talk to Nurse Summary of Call: Sleep meds rotating Ambien and Ambien CR - pt still wants this option - BP is elevated in the morning, before he take shis med.  Please advise Initial call taken by: Eugene Gavia,  May 25, 2009 10:04 AM  Follow-up for Phone Call        Spoke with pt's spouse Luster Landsberg.  She states that pt's BP is still high in the am before he takes his diovan and amlodipine.  She states that the diastolic is always in the lower to mid 90's.  After he takes meds BP is normal.  Also she states that pt needs both ambien cr 12.5 as well as the ambien 10 mg.  At last ov he was given the ambien cr 12.5 but still has some of the 10 mg and some nights he has to take the 10 mg to fall asleep, where some nights the 12.5 works better.  Pt's spouse aware SN out of the office until 05/29/09 and is fine with this. Follow-up by: Vernie Murders,  May 25, 2009 10:54 AM  Additional Follow-up for Phone Call Additional follow up Details #1::        per SN-----we can not give him both of these meds---SN can write one for 1 month then the other the next month but they will have to call each month for these---does he want a stronger sleep med?  thanks Randell Loop CMA  May 29, 2009 2:06 PM   LMTCB. Carron Curie CMA  May 29, 2009 2:21 PM     Additional Follow-up for Phone Call Additional follow up Details #2::    The patients spouse says he does not take take both Ambien and Ambien CR at the same time. The pt alternates these medications and is needing the Ambien 10mg  called to [facility]. She understands that she will have to call or have the pt call every month for refills if alternating the two medications. Follow-up by: Michel Bickers CMA,  May 29, 2009 3:45 PM  New/Updated Medications: ZOLPIDEM TARTRATE 10 MG TABS (ZOLPIDEM  TARTRATE) 1 by mouth at bedtime Prescriptions: ZOLPIDEM TARTRATE 10 MG TABS (ZOLPIDEM TARTRATE) 1 by mouth at bedtime  #30 x 0   Entered by:   Michel Bickers CMA   Authorized by:   Michele Mcalpine MD   Signed by:   Michel Bickers CMA on 05/29/2009   Method used:   Telephoned to ...       [facility]* (retail)       [address]       [place] , [place]  [id number]       Ph: [phone]       Fax: [phone]   RxID:   [id number]

## 2010-07-23 ENCOUNTER — Other Ambulatory Visit: Payer: Self-pay | Admitting: Pulmonary Disease

## 2010-08-06 ENCOUNTER — Other Ambulatory Visit: Payer: Self-pay | Admitting: Pulmonary Disease

## 2010-08-08 ENCOUNTER — Other Ambulatory Visit: Payer: Self-pay | Admitting: *Deleted

## 2010-08-08 MED ORDER — ZOLPIDEM TARTRATE ER 12.5 MG PO TBCR: 12.5000 mg | EXTENDED_RELEASE_TABLET | Freq: Every evening | ORAL | Status: DC | PRN

## 2010-12-07 ENCOUNTER — Ambulatory Visit: Payer: Self-pay | Admitting: Pulmonary Disease

## 2010-12-07 ENCOUNTER — Telehealth: Payer: Self-pay | Admitting: Pulmonary Disease

## 2010-12-07 DIAGNOSIS — Z0000 Encounter for general adult medical examination without abnormal findings: Secondary | ICD-10-CM | POA: Insufficient documentation

## 2010-12-07 NOTE — Telephone Encounter (Signed)
Labs are in the computer for the pt---called and spoke with pt and he is aware of labs in computer prior to ov.

## 2010-12-07 NOTE — Telephone Encounter (Signed)
Dr. Kriste Basque please advise what labs pt will need for physical. Thanks  Carver Fila, CMA

## 2010-12-14 ENCOUNTER — Other Ambulatory Visit: Payer: Self-pay | Admitting: Pulmonary Disease

## 2011-01-04 ENCOUNTER — Ambulatory Visit: Payer: Self-pay | Admitting: Pulmonary Disease

## 2011-01-09 ENCOUNTER — Other Ambulatory Visit (INDEPENDENT_AMBULATORY_CARE_PROVIDER_SITE_OTHER): Payer: Self-pay

## 2011-01-09 DIAGNOSIS — Z0000 Encounter for general adult medical examination without abnormal findings: Secondary | ICD-10-CM

## 2011-01-09 LAB — URINALYSIS
Bilirubin Urine: NEGATIVE
Hgb urine dipstick: NEGATIVE
Total Protein, Urine: NEGATIVE
Urine Glucose: NEGATIVE
pH: 5.5 (ref 5.0–8.0)

## 2011-01-09 LAB — HEPATIC FUNCTION PANEL
ALT: 34 U/L (ref 0–53)
AST: 26 U/L (ref 0–37)
Alkaline Phosphatase: 62 U/L (ref 39–117)
Bilirubin, Direct: 0.1 mg/dL (ref 0.0–0.3)
Total Protein: 7.1 g/dL (ref 6.0–8.3)

## 2011-01-09 LAB — LIPID PANEL
Total CHOL/HDL Ratio: 3
Triglycerides: 53 mg/dL (ref 0.0–149.0)

## 2011-01-09 LAB — BASIC METABOLIC PANEL
CO2: 27 mEq/L (ref 19–32)
Chloride: 105 mEq/L (ref 96–112)
Potassium: 4 mEq/L (ref 3.5–5.1)
Sodium: 139 mEq/L (ref 135–145)

## 2011-01-09 LAB — CBC WITH DIFFERENTIAL/PLATELET
Basophils Absolute: 0 10*3/uL (ref 0.0–0.1)
Basophils Relative: 0.3 % (ref 0.0–3.0)
Eosinophils Absolute: 0.1 10*3/uL (ref 0.0–0.7)
HCT: 46.8 % (ref 39.0–52.0)
Hemoglobin: 15.9 g/dL (ref 13.0–17.0)
Lymphs Abs: 2 10*3/uL (ref 0.7–4.0)
MCHC: 34 g/dL (ref 30.0–36.0)
Monocytes Relative: 8.6 % (ref 3.0–12.0)
Neutro Abs: 4.4 10*3/uL (ref 1.4–7.7)
RBC: 4.85 Mil/uL (ref 4.22–5.81)
RDW: 13 % (ref 11.5–14.6)

## 2011-01-14 ENCOUNTER — Encounter: Payer: Self-pay | Admitting: Pulmonary Disease

## 2011-01-15 ENCOUNTER — Ambulatory Visit (INDEPENDENT_AMBULATORY_CARE_PROVIDER_SITE_OTHER)
Admission: RE | Admit: 2011-01-15 | Discharge: 2011-01-15 | Disposition: A | Discharge status: Home / Self Care | Payer: BC Managed Care – PPO | Source: Ambulatory Visit | Attending: Pulmonary Disease | Admitting: Pulmonary Disease

## 2011-01-15 ENCOUNTER — Ambulatory Visit (INDEPENDENT_AMBULATORY_CARE_PROVIDER_SITE_OTHER): Payer: BC Managed Care – PPO | Admitting: Pulmonary Disease

## 2011-01-15 ENCOUNTER — Encounter: Payer: Self-pay | Admitting: Pulmonary Disease

## 2011-01-15 VITALS — BP 134/78 | HR 64 | Temp 97.5°F | Ht 68.0 in | Wt 240.2 lb

## 2011-01-15 DIAGNOSIS — Z0000 Encounter for general adult medical examination without abnormal findings: Secondary | ICD-10-CM

## 2011-01-15 DIAGNOSIS — M1990 Unspecified osteoarthritis, unspecified site: Secondary | ICD-10-CM

## 2011-01-15 DIAGNOSIS — E119 Type 2 diabetes mellitus without complications: Secondary | ICD-10-CM

## 2011-01-15 DIAGNOSIS — J309 Allergic rhinitis, unspecified: Secondary | ICD-10-CM

## 2011-01-15 DIAGNOSIS — I10 Essential (primary) hypertension: Secondary | ICD-10-CM

## 2011-01-15 DIAGNOSIS — G4700 Insomnia, unspecified: Secondary | ICD-10-CM

## 2011-01-15 DIAGNOSIS — E7800 Pure hypercholesterolemia, unspecified: Secondary | ICD-10-CM

## 2011-01-15 MED ORDER — IBUPROFEN 600 MG PO TABS: 600.0000 mg | ORAL_TABLET | Freq: Four times a day (QID) | ORAL | Status: DC | PRN

## 2011-01-15 MED ORDER — AZITHROMYCIN 250 MG PO TABS: ORAL_TABLET | ORAL | Status: AC

## 2011-01-15 MED ORDER — VALSARTAN 160 MG PO TABS: 160.0000 mg | ORAL_TABLET | Freq: Every day | ORAL | Status: DC

## 2011-01-15 MED ORDER — ZOLPIDEM TARTRATE ER 12.5 MG PO TBCR: 12.5000 mg | EXTENDED_RELEASE_TABLET | Freq: Every evening | ORAL | Status: DC | PRN

## 2011-01-15 MED ORDER — HYDROCOD POLST-CHLORPHEN POLST 10-8 MG/5ML PO LQCR: 5.0000 mL | Freq: Two times a day (BID) | ORAL | Status: DC

## 2011-01-15 MED ORDER — ZOLPIDEM TARTRATE 10 MG PO TABS: 10.0000 mg | ORAL_TABLET | Freq: Every evening | ORAL | Status: DC | PRN

## 2011-01-15 MED ORDER — PRAVASTATIN SODIUM 40 MG PO TABS: 40.0000 mg | ORAL_TABLET | Freq: Every day | ORAL | Status: DC

## 2011-01-15 MED ORDER — AMLODIPINE BESYLATE 5 MG PO TABS: 5.0000 mg | ORAL_TABLET | Freq: Every day | ORAL | Status: DC

## 2011-01-15 NOTE — Patient Instructions (Signed)
Today we updated your med list in EPIC...    We refilled your meds per request...  We reviewed your recent lab work & gave you a copy for your records...    Please collect the "stool cards" at your convenience & mail back to [place]...  Today we did your follow up CXR...    Please call the PHONE TREE in a few days for your results...    Dial [id number] & when prompted enter your patient number followed by the # symbol...    Your patient number is:  [id number]#  Let's get on track w/ our diet & exercise program...    The goal is to lose 15-20 lbs...  Call for any questions.Marland KitchenMarland Kitchen

## 2011-01-27 ENCOUNTER — Encounter: Payer: Self-pay | Admitting: Pulmonary Disease

## 2011-01-27 NOTE — Progress Notes (Signed)
Subjective:    Patient ID: Devin Roberts, male    DOB: 05/25/1960, 50 y.o.   MRN: [id number]  HPI 50 y/o WM, husb of Devin Roberts, here for a follow up visit & CPX...  ~  Mar10:  he states that he's been treated by an [facility] for bronchitis x 19month w/ cough (dry- no sputum), coughed so hard he had streaky hemoptysis x1, denies congestion/ drainage/ URI symptoms, denies f/c/s, notes chest sore from coughing & incr SOB... given Omnicef & Pred but no better he says... he is still taking Lisinopril/Hct for his BP- we stopped the Lisinopril (ACE cough), stopped the Pred (BS was 225) & started Exforge 5-160 for the BP... ~  Mar10:  he reports much improved off the ACE & on the Exforge 5-160 daily... cough is gone and BP= 142/82... weight 253# is down 15# in 3 weeks!!! [name]!!!... fingerstick BS= 110, prev A1c= 6.6.Marland Kitchen.   ~  May 12, 2009:  Yearly CPX feeling well- no new complaints or concerns... brings note from wife- wants him to have ZPak & Tussionex on hand, wants CXR & EKG & lab work faxed to her at [phone]... his BP is borderline here but he thinks "white coat" & reports BP OK at home by wife... Chol & BS look good on current meds.  ~  January 02, 2010:  58mo ROV &recent labs reviewed- looks good on diet, exercise, Prav40, + his BP meds (see below)... he requests copy of labs for wife, and Flu shot today...  ~  January 15, 2011:  Yearly ROV & CPX> Devin Roberts has had a good year, no new complaints or concerns, doing well overall on current regimen; unfortunately he has not lost any weight but he admits to not being on much of a diet;  He indicates that his wife wants copy of his lab work, Rx for [employer] & Tussionex to have on hand, and CXR/ PSA/ stool cards today; he had the Flu shot at work...    AR/ AB> on Allegra180 as needed for the pollen allergy; breathing has been good, no resp exac, no regular meds required, he uses Mucinex as needed...    HBP> on Diovan160 & Amlodipine5; BP= 134/78 & similar at  home he says; denies CP, palpit, syncope, SOB, edema, etc...    CHOL> on Pravastatin40 but not much of a diet; FLP looks good but needs to lose weight...    DM> on diet alone & BS=98, A1c not done; needs to restrict Carbs 7 get wt down thru combo of diet & exercise...    Overweight> weight = 240# down 3# over the last yr...    DJD> on OTC analgesics (& Motrin600) as needed; we again reviewed exercise program etc...    Insomnia> he uses BOTH Ambien10 & AmbienCR at diff times based on his sleep/wake cycle w/ shift workers sleep disorder (73yrs rotating swing shifts).          Problem List:   ALLERGIC RHINITIS (ICD-477.9) - he uses ALLEGRA 180mg /d as needed...  ASTHMATIC BRONCHITIS, ACUTE (ICD-466.0) - he has had exercise-induced asthma in the past> no regular meds required; he denies cough, phlegm, dyspnea, CP... ~  2/10:  treated at [facility] for "asthmatic bronchits" w/ Omnicef & Pred... BS incr to 225, & cough did not resolve... Lisinopril was stopped w/ resolution of cough. ~  Wife likes to have ZPak & Tussionex on hand for Prn use...  HYPERTENSION (ICD-401.9) - on AMLODIPINE 5mg /d, & DIOVAN 160mg /d... prev  Lisinopril stopped in 2010 due to ACE cough & given Exforge w/ control of BP but insurance wouldn't cover it- switched to Diovan+Amlodipine...  ~  BP well controlled, tolerates meds well- denies HA, fatigue, visual changes, CP, palipit, dizziness, syncope, dyspnea, edema, etc...   HYPERCHOLESTEROLEMIA (ICD-272.0) - on PRAVASTATIN 40mg /d since 2009 (prev on Simva80 but intol w/ leg cramps). ~  FLP 8/06 on diet showed TChol 232, TG 117, HDL28, LDL 168... Simva80 started. ~  FLP 9/07 on Simva80 showed TChol 149, TG 41, HDL 43, LDL 98 ~  FLP 11/08 on Simva80 showed TChol 176, TG 67, HDL 45, LDL 118 ~  FLP 2/10 on Prav40 showed TChol 171, TG 231, HDL 48, LDL 98... rec- same med, better low fat diet. ~  FLP 2/11 on Prav40 showed TChol 156, TG 42, HDL 48, LDL 100 ~  FLP 10/11 on Prav40 showed TChol  164, TG 113, HDL 41, LDL 101 ~  FLP 10/12 on Prav40 showed TChol 151, TG 53, HDL 45, LDL 95  DIABETES MELLITUS (ICD-250.00) - new dx 2/10 w/ labs showing FBS= 225 (on Pred from [facility])> recheck off Pred 3/10 showed BS= 122, HgA1c= 6.6.Marland KitchenMarland Kitchen rec- diet + exercise, get weight down! ~  NOTE:  prev FBS's from 2000-2008 were 95 - 131 ~  labs 2/11 showed BS= 122, A1c= 6.0.Marland KitchenMarland Kitchen needs weight reduction. ~  labs 10/11 on diet alone showed BS= 97 ~  Labs 10/12 on diet alone showed BS= 98  OBESITY (ICD-278.00) - he was referred to [facility] in 2005 for diet counselling, but he never went>  finally got serious about wt reduction in 2010 & steadily improved for a while, now off diet & weight stagnant> we reviewed need for diet/ exercise/ wt reduction... ~  11/08 weight = 255#,  5\' 8"  tall,  BMI= 38... ~  3/10 weight = 268# & lost to 253# in 3 weeks! ~  2/11 weight = 253#... he needs to do better! ~  10/11 weight = 243# ~  10/12 weight = 240#  GERD (ICD-530.81) - hx reflux symptoms in the past... prev Rx w/ OTC antacids, H2 blockers, etc... he saw Devin Roberts in 2002 for hoarseness- believed related to reflux... no previous work up and no active symptoms at present...  DEGENERATIVE JOINT DISEASE (ICD-715.90) - he's had pain in shoulders & takes IBUPROFEN 600mg  Tid...  HEADACHE (ICD-784.0) - he saw Devin Roberts in 1990 for muscle contraction HA's...  INSOMNIA, CHRONIC (ICD-307.42) - he requires AMBIEN for rest due to his Shift Workers Sleep Disorder (he wants BOTH AMBIEN 10mg  & AMBIEN CR 12.5mg  tabs...  HEALTH MAINTENANCE: ~  GI:  He will need GI eval & screening colon at 51; rectal= neg, stool heme neg; wife wants him to have stool cards as well... ~  GU:  DRE neg & PSA= 0.77... ~  Immuniz:  He gets the seasonal Flu vaccines at work each yr;  ?last Tetanus shot...   No past surgical history on file.   Outpatient Encounter Prescriptions as of 01/15/2011  Medication Sig Dispense Refill  . amLODipine  (NORVASC) 5 MG tablet Take 1 tablet (5 mg total) by mouth daily.  30 tablet  11  . fexofenadine (ALLEGRA) 180 MG tablet Take 180 mg by mouth daily as needed.        Marland Kitchen ibuprofen (ADVIL,MOTRIN) 600 MG tablet Take 1 tablet (600 mg total) by mouth every 6 (six) hours as needed.  30 tablet  11  . pravastatin (PRAVACHOL) 40 MG  tablet Take 1 tablet (40 mg total) by mouth daily.  30 tablet  11  . valsartan (DIOVAN) 160 MG tablet Take 1 tablet (160 mg total) by mouth daily.  30 tablet  11  . zolpidem (AMBIEN CR) 12.5 MG CR tablet Take 1 tablet (12.5 mg total) by mouth at bedtime as needed for sleep. As needed for insomnia  30 tablet  5  . zolpidem (AMBIEN) 10 MG tablet Take 1 tablet (10 mg total) by mouth at bedtime as needed for sleep.  30 tablet  5  . azithromycin (ZITHROMAX) 250 MG tablet Take 2 tablets (500 mg) on  Day 1,  followed by 1 tablet (250 mg) once daily on Days 2 through 5.  6 each  1  . chlorpheniramine-HYDROcodone (TUSSIONEX PENNKINETIC ER) 10-8 MG/5ML LQCR Take 5 mLs by mouth every 12 (twelve) hours.  120 mL  5    Allergies  Allergen Reactions  . Lisinopril     REACTION: Allergic to ACE inhibitors w/ cough  . Simvastatin     REACTION: pt states ZOCOR caused leg cramps    Current Medications, Allergies, Past Medical History, Past Surgical History, Family History, and Social History were reviewed in [employer] record.    Review of Systems         See HPI - all other systems neg except as noted... The patient denies anorexia, fever, weight loss, weight gain, vision loss, decreased hearing, hoarseness, chest pain, syncope, dyspnea on exertion, peripheral edema, prolonged cough, headaches, hemoptysis, abdominal pain, melena, hematochezia, severe indigestion/heartburn, hematuria, incontinence, muscle weakness, suspicious skin lesions, transient blindness, difficulty walking, depression, unusual weight change, abnormal bleeding, enlarged lymph nodes, and angioedema.      Objective:   Physical Exam     WD, Obese, 50 y/o WM in NAD... GENERAL:  Alert & oriented; pleasant & cooperative... HEENT:  [place]/AT, EOM-wnl, PERRLA, EACs-clear, TMs-wnl, NOSE-clear, THROAT-clear & wnl. NECK:  Supple w/ full ROM; no JVD; normal carotid impulses w/o bruits; no thyromegaly or nodules palpated; no lymphadenopathy. CHEST:  Clear to P & A; without wheezes/ rales/ or rhonchi. HEART:  Regular Rhythm; without murmurs/ rubs/ or gallops. ABDOMEN:  Soft & nontender; normal bowel sounds; no organomegaly or masses detected. EXT: without deformities or arthritic changes; no varicose veins/ venous insuffic/ or edema. NEURO:  CN's intact;  no focal neuro deficits... DERM:  No lesions noted; no rash etc...   Assessment & Plan:   AR/ AB> on Allegra180 as needed for the pollen allergy; breathing has been good, no resp exac, no regular meds required, he uses Mucinex as needed...     HBP> on Diovan160 & Amlodipine5; BP is well controlled, continue same Rx...     CHOL> on Pravastatin40 but not much of a diet; FLP looks good but needs to lose weight...     DM> on diet alone & BS=98, A1c not done; needs to restrict Carbs & get wt down thru combo of diet & exercise...     Overweight> weight = 240# down 3# over the last yr...     DJD> on OTC analgesics (& Motrin600) as needed; we again reviewed exercise program etc...     Insomnia> he uses BOTH Ambien10 & AmbienCR at diff times based on his sleep/wake cycle w/ shift workers sleep disorder (60yrs rotating swing shifts).

## 2011-01-29 ENCOUNTER — Telehealth: Payer: Self-pay | Admitting: Pulmonary Disease

## 2011-01-29 NOTE — Telephone Encounter (Signed)
Spoke with pt's spouse and notified that the results of the cxr are recorded on the phone tree per result notes. She verbalized understanding and states nothing further needed.

## 2011-07-30 ENCOUNTER — Other Ambulatory Visit: Payer: Self-pay | Admitting: Pulmonary Disease

## 2011-11-27 ENCOUNTER — Other Ambulatory Visit: Payer: Self-pay | Admitting: *Deleted

## 2011-11-27 MED ORDER — ZOLPIDEM TARTRATE ER 12.5 MG PO TBCR: 12.5000 mg | EXTENDED_RELEASE_TABLET | Freq: Every evening | ORAL | Status: DC | PRN

## 2011-11-27 NOTE — Telephone Encounter (Signed)
Received refill request for Ambien Tart ER 12.5 MG. Take 1 tablet PO at bedtime PRN for sleep. Last refilled 01/17/11 #30 x 5 refills. Last OV 01/15/11 and pending f/u 01/30/12. Please advise SN thanks   [facility]

## 2012-01-16 ENCOUNTER — Other Ambulatory Visit: Payer: Self-pay | Admitting: Pulmonary Disease

## 2012-01-16 ENCOUNTER — Telehealth: Payer: Self-pay | Admitting: Pulmonary Disease

## 2012-01-16 DIAGNOSIS — Z0000 Encounter for general adult medical examination without abnormal findings: Secondary | ICD-10-CM

## 2012-01-16 NOTE — Telephone Encounter (Signed)
Lab orders have been placed for the pt and i called and lmom to make the pt aware.

## 2012-01-16 NOTE — Telephone Encounter (Signed)
Error.  Duplicate message.  Devin Roberts °- °

## 2012-01-22 ENCOUNTER — Telehealth: Payer: Self-pay | Admitting: Pulmonary Disease

## 2012-01-22 NOTE — Telephone Encounter (Signed)
PA form received and given to [facility] for SN to review and sign.

## 2012-01-22 NOTE — Telephone Encounter (Addendum)
Member # [id number].  Awaiting faxed form for Diovan 160 mg. PA could not be done via phone call.  Will forward to [facility] for follow-up.

## 2012-01-28 NOTE — Telephone Encounter (Signed)
Pt's spouse Luster Landsberg) called to follow up on the prior auth for Diovan.  Call her @ [phone]. Leanora Ivanoff

## 2012-01-28 NOTE — Telephone Encounter (Signed)
diovan PA has been approved through the insurance company--i have called and spoke with pts wife and she will have the pharmacy run this through again and will call back if any problems.  Form has been scanned into the pts chart.

## 2012-01-29 ENCOUNTER — Telehealth: Payer: Self-pay | Admitting: Pulmonary Disease

## 2012-01-29 NOTE — Telephone Encounter (Signed)
Will route this message to Leigh (per Leigh) to handle. Marliss Czar has the forms that deal with this matter and will contact the patient.

## 2012-01-29 NOTE — Telephone Encounter (Signed)
Form received from PA stated that the diovan has not been approved.  SN will review the pts chart and see why he was intolerant to lisinopril---this is one of the medications that the pt must try and fail.  Will call the insurance PA back this afternoon about this medication.  i called and spoke with pts wife and she is aware.

## 2012-01-30 ENCOUNTER — Ambulatory Visit: Payer: BC Managed Care – PPO | Admitting: Pulmonary Disease

## 2012-01-30 NOTE — Telephone Encounter (Signed)
Per SN---per the pts insurance he must fail lisinopril in the last 90 days---we will need to change to losartan 100 mg  1 daily  #90 with 3 refills. i called and lmomtcb to make them aware.

## 2012-01-31 MED ORDER — LOSARTAN POTASSIUM 100 MG PO TABS: 100.0000 mg | ORAL_TABLET | Freq: Every day | ORAL | Status: DC

## 2012-01-31 NOTE — Telephone Encounter (Signed)
Luster Landsberg (spouse) called back.  Call her on her work # [phone].  If you call before 9:00 press 8 to get through.  Thanks!  Devin Roberts

## 2012-01-31 NOTE — Telephone Encounter (Signed)
i called and spoke with renee and she is aware of the change from the diovan to the losartan and she is aware that we will send in new rx for the new meds.  Nothing further is needed.

## 2012-01-31 NOTE — Telephone Encounter (Signed)
lmomtcb x1 

## 2012-02-12 ENCOUNTER — Other Ambulatory Visit: Payer: Self-pay | Admitting: *Deleted

## 2012-02-12 MED ORDER — AMLODIPINE BESYLATE 5 MG PO TABS: 5.0000 mg | ORAL_TABLET | Freq: Every day | ORAL | Status: DC

## 2012-02-13 ENCOUNTER — Other Ambulatory Visit: Payer: Self-pay | Admitting: *Deleted

## 2012-02-13 MED ORDER — ZOLPIDEM TARTRATE 10 MG PO TABS: 10.0000 mg | ORAL_TABLET | Freq: Every evening | ORAL | Status: DC | PRN

## 2012-02-21 ENCOUNTER — Telehealth: Payer: Self-pay | Admitting: Pulmonary Disease

## 2012-02-21 MED ORDER — AZITHROMYCIN 250 MG PO TABS: ORAL_TABLET | ORAL | Status: DC

## 2012-02-21 NOTE — Telephone Encounter (Signed)
Per SN---ok to call in zpak  #1  Take as directed.  With no refills.  This has been sent to the pharmacy and pt is aware.

## 2012-02-21 NOTE — Telephone Encounter (Signed)
Per pt's spouse, pt has had sob, prod cough, sore throat, fever of 99.2 and runny nose x 2-3 days. RX requested for Zpak. Pls advise. Allergies  Allergen Reactions  . Lisinopril     REACTION: Allergic to ACE inhibitors w/ cough  . Simvastatin     REACTION: pt states ZOCOR caused leg cramps

## 2012-03-02 ENCOUNTER — Other Ambulatory Visit: Payer: Self-pay | Admitting: Pulmonary Disease

## 2012-03-03 ENCOUNTER — Other Ambulatory Visit (INDEPENDENT_AMBULATORY_CARE_PROVIDER_SITE_OTHER): Payer: BC Managed Care – PPO

## 2012-03-03 DIAGNOSIS — Z0000 Encounter for general adult medical examination without abnormal findings: Secondary | ICD-10-CM

## 2012-03-03 LAB — HEPATIC FUNCTION PANEL
ALT: 36 U/L (ref 0–53)
Albumin: 3.9 g/dL (ref 3.5–5.2)
Alkaline Phosphatase: 47 U/L (ref 39–117)
Total Protein: 6.8 g/dL (ref 6.0–8.3)

## 2012-03-03 LAB — LIPID PANEL
HDL: 33.9 mg/dL — ABNORMAL LOW (ref >39.00)
Triglycerides: 106 mg/dL (ref 0.0–149.0)

## 2012-03-03 LAB — URINALYSIS
Hgb urine dipstick: NEGATIVE
Ketones, ur: NEGATIVE
Leukocytes, UA: NEGATIVE
Specific Gravity, Urine: 1.02 (ref 1.000–1.030)
Urine Glucose: NEGATIVE
Urobilinogen, UA: 1 (ref 0.0–1.0)

## 2012-03-03 LAB — CBC WITH DIFFERENTIAL/PLATELET
Basophils Absolute: 0.1 10*3/uL (ref 0.0–0.1)
Eosinophils Absolute: 0.1 10*3/uL (ref 0.0–0.7)
Hemoglobin: 15.6 g/dL (ref 13.0–17.0)
Lymphocytes Relative: 26.8 % (ref 12.0–46.0)
MCHC: 34.3 g/dL (ref 30.0–36.0)
Monocytes Relative: 8.3 % (ref 3.0–12.0)
Neutro Abs: 4.6 10*3/uL (ref 1.4–7.7)
Neutrophils Relative %: 63.4 % (ref 43.0–77.0)
RDW: 12.2 % (ref 11.5–14.6)

## 2012-03-03 LAB — TSH: TSH: 0.69 u[IU]/mL (ref 0.35–5.50)

## 2012-03-03 LAB — BASIC METABOLIC PANEL
CO2: 29 mEq/L (ref 19–32)
Calcium: 9 mg/dL (ref 8.4–10.5)
Chloride: 104 mEq/L (ref 96–112)
Creatinine, Ser: 1.2 mg/dL (ref 0.4–1.5)
Glucose, Bld: 115 mg/dL — ABNORMAL HIGH (ref 70–99)
Sodium: 138 mEq/L (ref 135–145)

## 2012-03-03 LAB — PSA: PSA: 0.63 ng/mL (ref 0.10–4.00)

## 2012-03-09 ENCOUNTER — Encounter: Payer: Self-pay | Admitting: *Deleted

## 2012-03-10 ENCOUNTER — Ambulatory Visit (INDEPENDENT_AMBULATORY_CARE_PROVIDER_SITE_OTHER)
Admission: RE | Admit: 2012-03-10 | Discharge: 2012-03-10 | Disposition: A | Discharge status: Home / Self Care | Payer: BC Managed Care – PPO | Source: Ambulatory Visit | Attending: Pulmonary Disease | Admitting: Pulmonary Disease

## 2012-03-10 ENCOUNTER — Encounter: Payer: Self-pay | Admitting: Pulmonary Disease

## 2012-03-10 ENCOUNTER — Ambulatory Visit (INDEPENDENT_AMBULATORY_CARE_PROVIDER_SITE_OTHER): Payer: BC Managed Care – PPO | Admitting: Pulmonary Disease

## 2012-03-10 VITALS — BP 140/90 | HR 74 | Temp 98.2°F | Ht 68.0 in | Wt 250.4 lb

## 2012-03-10 DIAGNOSIS — E119 Type 2 diabetes mellitus without complications: Secondary | ICD-10-CM

## 2012-03-10 DIAGNOSIS — Z0000 Encounter for general adult medical examination without abnormal findings: Secondary | ICD-10-CM

## 2012-03-10 DIAGNOSIS — E7800 Pure hypercholesterolemia, unspecified: Secondary | ICD-10-CM

## 2012-03-10 DIAGNOSIS — K219 Gastro-esophageal reflux disease without esophagitis: Secondary | ICD-10-CM

## 2012-03-10 DIAGNOSIS — E118 Type 2 diabetes mellitus with unspecified complications: Secondary | ICD-10-CM | POA: Insufficient documentation

## 2012-03-10 DIAGNOSIS — I10 Essential (primary) hypertension: Secondary | ICD-10-CM

## 2012-03-10 DIAGNOSIS — J209 Acute bronchitis, unspecified: Secondary | ICD-10-CM

## 2012-03-10 DIAGNOSIS — M1990 Unspecified osteoarthritis, unspecified site: Secondary | ICD-10-CM

## 2012-03-10 DIAGNOSIS — E1165 Type 2 diabetes mellitus with hyperglycemia: Secondary | ICD-10-CM | POA: Insufficient documentation

## 2012-03-10 DIAGNOSIS — J309 Allergic rhinitis, unspecified: Secondary | ICD-10-CM

## 2012-03-10 DIAGNOSIS — G4700 Insomnia, unspecified: Secondary | ICD-10-CM

## 2012-03-10 DIAGNOSIS — E669 Obesity, unspecified: Secondary | ICD-10-CM

## 2012-03-10 MED ORDER — LOSARTAN POTASSIUM 100 MG PO TABS: 100.0000 mg | ORAL_TABLET | Freq: Every day | ORAL | Status: DC

## 2012-03-10 MED ORDER — ZOLPIDEM TARTRATE 10 MG PO TABS: 10.0000 mg | ORAL_TABLET | Freq: Every evening | ORAL | Status: DC | PRN

## 2012-03-10 MED ORDER — ZOLPIDEM TARTRATE ER 12.5 MG PO TBCR: 12.5000 mg | EXTENDED_RELEASE_TABLET | Freq: Every evening | ORAL | Status: DC | PRN

## 2012-03-10 MED ORDER — PRAVASTATIN SODIUM 40 MG PO TABS: 40.0000 mg | ORAL_TABLET | Freq: Every day | ORAL | Status: DC

## 2012-03-10 MED ORDER — AMLODIPINE BESYLATE 5 MG PO TABS: 5.0000 mg | ORAL_TABLET | Freq: Every day | ORAL | Status: DC

## 2012-03-10 NOTE — Patient Instructions (Addendum)
Today we updated your med list in our EPIC system...    Continue your current medications the same...    We refilled the meds you requested...  Let's get on track w/ our low carb diet 7 exercise program...    The goal is to lose 15-20 lbs...  Exercise is a great stress reliever!!!  Call for any questions.Marland KitchenMarland Kitchen

## 2012-03-13 ENCOUNTER — Telehealth: Payer: Self-pay | Admitting: Pulmonary Disease

## 2012-03-13 MED ORDER — AZITHROMYCIN 250 MG PO TABS: ORAL_TABLET | ORAL | Status: DC

## 2012-03-13 MED ORDER — HYDROCOD POLST-CHLORPHEN POLST 10-8 MG/5ML PO LQCR: 5.0000 mL | Freq: Two times a day (BID) | ORAL | Status: DC

## 2012-03-13 NOTE — Progress Notes (Signed)
Quick Note:  Spoke with patients wife, informed her of results as listed below per Dr. Kriste Basque. Nothing further needed at this time ______

## 2012-03-13 NOTE — Telephone Encounter (Signed)
Returned call.  Holly D Pryor ° °

## 2012-03-13 NOTE — Telephone Encounter (Signed)
lmomtcb x1 

## 2012-03-13 NOTE — Telephone Encounter (Signed)
Notes Recorded by Michele Mcalpine, MD on 03/12/2012 at 8:21 AM Please notify patient>  CXR is clear & heart size normal- NAD.Marland KitchenMarland Kitchen ----- LMOMTCB X1

## 2012-03-13 NOTE — Telephone Encounter (Signed)
Spoke with patients wife made her of recs as lsited below per Dr. Kriste Basque.  Verbalized understanding and req rx for zpack tussionex on hand that was supposed to be sent in at last ov. Rx sent in and nothing further needed at this time   Notes Recorded by Michele Mcalpine, MD on 03/12/2012 at 8:21 AM Please notify patient>  CXR is clear & heart size normal- NAD.Marland KitchenMarland Kitchen

## 2012-05-09 NOTE — Progress Notes (Signed)
Subjective:    Patient ID: Devin Roberts, male    DOB: 01-May-1960, 52 y.o.   MRN: [id number]  HPI 52 y/o WM, husb of Devin Roberts, here for a follow up visit & CPX...  ~  Mar10:  he states that he's been treated by an [facility] for bronchitis x 37month w/ cough (dry- no sputum), coughed so hard he had streaky hemoptysis x1, denies congestion/ drainage/ URI symptoms, denies f/c/s, notes chest sore from coughing & incr SOB... given Omnicef & Pred but no better he says... he is still taking Lisinopril/Hct for his BP- we stopped the Lisinopril (ACE cough), stopped the Pred (BS was 225) & started Exforge 5-160 for the BP... ~  Mar10:  he reports much improved off the ACE & on the Exforge 5-160 daily... cough is gone and BP= 142/82... weight 253# is down 15# in 3 weeks!!! [name]!!!... fingerstick BS= 110, prev A1c= 6.6.Marland Kitchen.   ~  May 12, 2009:  Yearly CPX feeling well- no new complaints or concerns... brings note from wife- wants him to have ZPak & Tussionex on hand, wants CXR & EKG & lab work faxed to her at [phone]... his BP is borderline here but he thinks "white coat" & reports BP OK at home by wife... Chol & BS look good on current meds.  ~  January 02, 2010:  50mo ROV &recent labs reviewed- looks good on diet, exercise, Prav40, + his BP meds (see below)... he requests copy of labs for wife, and Flu shot today...  ~  January 15, 2011:  Yearly ROV & CPX> Devin Roberts has had a good year, no new complaints or concerns, doing well overall on current regimen; unfortunately he has not lost any weight but he admits to not being on much of a diet;  He indicates that his wife wants copy of his lab work, Rx for [employer] & Tussionex to have on hand, and CXR/ PSA/ stool cards today; he had the Flu shot at work...    AR/ AB> on Allegra180 as needed for the pollen allergy; breathing has been good, no resp exac, no regular meds required, he uses Mucinex as needed...    HBP> on Diovan160 & Amlodipine5; BP= 134/78 & similar at  home he says; denies CP, palpit, syncope, SOB, edema, etc...    CHOL> on Pravastatin40 but not much of a diet; FLP looks good but needs to lose weight...    DM> on diet alone & BS=98, A1c not done; needs to restrict Carbs 7 get wt down thru combo of diet & exercise...    Overweight> weight = 240# down 3# over the last yr...    DJD> on OTC analgesics (& Motrin600) as needed; we again reviewed exercise program etc...    Insomnia> he uses BOTH Ambien10 & AmbienCR at diff times based on his sleep/wake cycle w/ shift workers sleep disorder (67yrs rotating swing shifts).  ~  March 10, 2012:  48mo ROV & CPX... Devin Roberts indicates that this has been a big life changing yr- his job moved to [employer] he has enrolled at [employer] to [employer]...    AR/ AB> on Allegra180, Tussionex prn & wants a ZPak to keep on hand; no recent resp exac & denies cough, sput, hemoptysis, SOB, CP, etc...    HBP> on Amlod5, Losar100; BP= 144/98 w/ reg cuff but he's gained 10# & not restricting sodium; we discussed all diet issues, must get wt down; he says BP is better at  home...    Chol> on Prav40; FLP shows TChol 137, TG 106, HDL 34, LDL 82    DM> on diet alone; Labs show BS=115 & we reviewed low carb wt reducing diet...    Obesity> wt is up 10# to 250#; we discussed diet, exercise, wt reduction strategies...    GI- GERD, need for screening colon> he knows he can take Prilosec20 vs Zantac OTC as needed...    DJD> on Ibuprofen prn; he admits to some shoulder pain & OTC meds help...    Chr persistent insomnia> he insists on BOTH Ambien10 & AmbienCR12.5; prev had shift workers sleep disorder but now going to [employer] for computer classes & career change... We reviewed prob list, meds, xrays and labs> see below for updates >> he had the Flu vaccine 10/13... CXR 12/13 showed normal heart size, clear lungs, NAD.Marland KitchenMarland Kitchen EKG 12/13 showed NSR, rate67, wnl, NAD... LABS 12/13:  FLP- at goals on Prav40 x HDL=34, Rec incr  exerc;  Chems- wnl x BS=115; CBC- wnl;  TSH=0.69;  PSA=0.63;  UA- clear...          Problem List:   ALLERGIC RHINITIS (ICD-477.9) - he uses ALLEGRA 180mg /d as needed...  ASTHMATIC BRONCHITIS, ACUTE (ICD-466.0) - he has had exercise-induced asthma in the past> no regular meds required; he denies cough, phlegm, dyspnea, CP... ~  2/10:  treated at [facility] for "asthmatic bronchits" w/ Omnicef & Pred... BS incr to 225, & cough did not resolve... Lisinopril was stopped w/ resolution of cough. ~  Wife likes to have ZPak & Tussionex on hand for Prn use...  HYPERTENSION (ICD-401.9) - on AMLODIPINE 5mg /d, & DIOVAN 160mg /d... prev Lisinopril stopped in 2010 due to ACE cough & given Exforge w/ control of BP but insurance wouldn't cover it- switched to Diovan+Amlodipine...  ~  BP well controlled, tolerates meds well- denies HA, fatigue, visual changes, CP, palipit, dizziness, syncope, dyspnea, edema, etc...  ~  12/13: on Amlod5, Losar100; BP= 144/98 w/ reg cuff but he's gained 10# & not restricting sodium; we discussed all diet issues, must get wt down; he says BP is better at home.   HYPERCHOLESTEROLEMIA (ICD-272.0) - on PRAVASTATIN 40mg /d since 2009 (prev on Simva80 but intol w/ leg cramps). ~  FLP 8/06 on diet showed TChol 232, TG 117, HDL28, LDL 168... Simva80 started. ~  FLP 9/07 on Simva80 showed TChol 149, TG 41, HDL 43, LDL 98 ~  FLP 11/08 on Simva80 showed TChol 176, TG 67, HDL 45, LDL 118 ~  FLP 2/10 on Prav40 showed TChol 171, TG 231, HDL 48, LDL 98... rec- same med, better low fat diet. ~  FLP 2/11 on Prav40 showed TChol 156, TG 42, HDL 48, LDL 100 ~  FLP 10/11 on Prav40 showed TChol 164, TG 113, HDL 41, LDL 101 ~  FLP 10/12 on Prav40 showed TChol 151, TG 53, HDL 45, LDL 95 ~  FLP 12/13 on Prav40 showed TChol 137, TG 106, HDL 34, LDL 82   DIABETES MELLITUS (ICD-250.00) - new dx 2/10 w/ labs showing FBS= 225 (on Pred from [facility])> recheck off Pred 3/10 showed BS= 122, HgA1c= 6.6.Marland KitchenMarland Kitchen rec- diet +  exercise, get weight down! ~  NOTE:  prev FBS's from 2000-2008 were 95 - 131 ~  labs 2/11 showed BS= 122, A1c= 6.0.Marland KitchenMarland Kitchen needs weight reduction. ~  labs 10/11 on diet alone showed BS= 97 ~  Labs 10/12 on diet alone showed BS= 98 ~  Labs 12/13 on diet alone showed BS= 115  OBESITY (ICD-278.00) - he was referred to [facility] in 2005 for diet counselling, but he never went>  finally got serious about wt reduction in 2010 & steadily improved for a while, now off diet & weight stagnant> we reviewed need for diet/ exercise/ wt reduction... ~  11/08 weight = 255#,  5\' 8"  tall,  BMI= 38... ~  3/10 weight = 268# & lost to 253# in 3 weeks! ~  2/11 weight = 253#... he needs to do better! ~  10/11 weight = 243# ~  10/12 weight = 240# ~  12/13 weight = 250#  GERD (ICD-530.81) - hx reflux symptoms in the past... prev Rx w/ OTC antacids, H2 blockers, etc... he saw DrByers in 2002 for hoarseness- believed related to reflux... no previous work up and no active symptoms at present... ~  12/13: he is now 52 y/o & in need of screening colonoscopy- wife says she will sched this for him...  DEGENERATIVE JOINT DISEASE (ICD-715.90) - he's had pain in shoulders & takes IBUPROFEN 600mg  Tid...  HEADACHE (ICD-784.0) - he saw DrAdelman in 1990 for muscle contraction HA's...  INSOMNIA, CHRONIC (ICD-307.42) - he requires AMBIEN for rest due to his Shift Workers Sleep Disorder (he wants BOTH AMBIEN 10mg  & AMBIEN CR 12.5mg  tabs...  HEALTH MAINTENANCE: ~  GI:  He will need GI eval & screening colon at 51; rectal= neg, stool heme neg; wife wants him to have stool cards as well... ~  GU:  DRE neg & PSA= 0.63... ~  Immuniz:  He gets the seasonal Flu vaccines at work each yr;  ?last Tetanus shot...   No past surgical history on file.   Outpatient Encounter Prescriptions as of 03/10/2012  Medication Sig Dispense Refill  . amLODipine (NORVASC) 5 MG tablet Take 1 tablet (5 mg total) by mouth daily.  90 tablet  3   . fexofenadine (ALLEGRA) 180 MG tablet Take 180 mg by mouth daily as needed.        Marland Kitchen ibuprofen (ADVIL,MOTRIN) 600 MG tablet Take 1 tablet (600 mg total) by mouth every 6 (six) hours as needed.  30 tablet  11  . losartan (COZAAR) 100 MG tablet Take 1 tablet (100 mg total) by mouth daily.  90 tablet  3  . pravastatin (PRAVACHOL) 40 MG tablet Take 1 tablet (40 mg total) by mouth daily.  90 tablet  3  . zolpidem (AMBIEN CR) 12.5 MG CR tablet Take 1 tablet (12.5 mg total) by mouth at bedtime as needed for sleep. As needed for insomnia  30 tablet  5  . zolpidem (AMBIEN) 10 MG tablet Take 1 tablet (10 mg total) by mouth at bedtime as needed for sleep.  30 tablet  5  . [DISCONTINUED] amLODipine (NORVASC) 5 MG tablet Take 1 tablet (5 mg total) by mouth daily.  30 tablet  0  . [DISCONTINUED] chlorpheniramine-HYDROcodone (TUSSIONEX PENNKINETIC ER) 10-8 MG/5ML LQCR Take 5 mLs by mouth every 12 (twelve) hours.  120 mL  5  . [DISCONTINUED] losartan (COZAAR) 100 MG tablet Take 1 tablet (100 mg total) by mouth daily.  90 tablet  3  . [DISCONTINUED] pravastatin (PRAVACHOL) 40 MG tablet TAKE ONE TABLET AT BEDTIME.  30 tablet  0  . [DISCONTINUED] zolpidem (AMBIEN CR) 12.5 MG CR tablet Take 1 tablet (12.5 mg total) by mouth at bedtime as needed for sleep. As needed for insomnia  30 tablet  2  . [DISCONTINUED] zolpidem (AMBIEN) 10 MG tablet Take 1 tablet (10 mg  total) by mouth at bedtime as needed for sleep.  30 tablet  3  . [DISCONTINUED] azithromycin (ZITHROMAX) 250 MG tablet Take as directed  6 each  0  . [DISCONTINUED] zolpidem (AMBIEN CR) 12.5 MG CR tablet Take 1 tablet (12.5 mg total) by mouth at bedtime as needed for sleep. Patient alternates with Ambien 10 mg.  30 tablet  5   No facility-administered encounter medications on file as of 03/10/2012.    Allergies  Allergen Reactions  . Lisinopril     REACTION: Allergic to ACE inhibitors w/ cough  . Simvastatin     REACTION: pt states ZOCOR caused leg  cramps    Current Medications, Allergies, Past Medical History, Past Surgical History, Family History, and Social History were reviewed in [employer] record.    Review of Systems         See HPI - all other systems neg except as noted... The patient denies anorexia, fever, weight loss, weight gain, vision loss, decreased hearing, hoarseness, chest pain, syncope, dyspnea on exertion, peripheral edema, prolonged cough, headaches, hemoptysis, abdominal pain, melena, hematochezia, severe indigestion/heartburn, hematuria, incontinence, muscle weakness, suspicious skin lesions, transient blindness, difficulty walking, depression, unusual weight change, abnormal bleeding, enlarged lymph nodes, and angioedema.     Objective:   Physical Exam     WD, Obese, 52 y/o WM in NAD... GENERAL:  Alert & oriented; pleasant & cooperative... HEENT:  /AT, EOM-wnl, PERRLA, EACs-clear, TMs-wnl, NOSE-clear, THROAT-clear & wnl. NECK:  Supple w/ full ROM; no JVD; normal carotid impulses w/o bruits; no thyromegaly or nodules palpated; no lymphadenopathy. CHEST:  Clear to P & A; without wheezes/ rales/ or rhonchi. HEART:  Regular Rhythm; without murmurs/ rubs/ or gallops. ABDOMEN:  Soft & nontender; normal bowel sounds; no organomegaly or masses detected. EXT: without deformities or arthritic changes; no varicose veins/ venous insuffic/ or edema. NEURO:  CN's intact;  no focal neuro deficits... DERM:  No lesions noted; no rash etc...  RADIOLOGY DATA:  Reviewed in the EPIC EMR & discussed w/ the patient...  LABORATORY DATA:  Reviewed in the EPIC EMR & discussed w/ the patient...   Assessment & Plan:    AR/ AB> on Allegra180 as needed for the pollen allergy; breathing has been good, no resp exac, no regular meds required, he uses Mucinex as needed...     HBP> on Diovan160 & Amlodipine5; BP is well controlled, continue same Rx...     CHOL> on Pravastatin40 but not much of a diet; FLP  looks good but needs to lose weight...     DM> on diet alone & BS=98, A1c not done; needs to restrict Carbs & get wt down thru combo of diet & exercise...     Overweight> weight = 240# down 3# over the last yr...      DJD> on OTC analgesics (& Motrin600) as needed; we again reviewed exercise program etc...     Insomnia> he uses BOTH Ambien10 & AmbienCR at diff times based on his sleep/wake cycle w/ shift workers sleep disorder (70yrs rotating swing shifts).   Patient's Medications  New Prescriptions   No medications on file  Previous Medications   FEXOFENADINE (ALLEGRA) 180 MG TABLET    Take 180 mg by mouth daily as needed.     IBUPROFEN (ADVIL,MOTRIN) 600 MG TABLET    Take 1 tablet (600 mg total) by mouth every 6 (six) hours as needed.  Modified Medications   Modified Medication Previous Medication   AMLODIPINE (NORVASC) 5  MG TABLET amLODipine (NORVASC) 5 MG tablet      Take 1 tablet (5 mg total) by mouth daily.    Take 1 tablet (5 mg total) by mouth daily.   AZITHROMYCIN (ZITHROMAX) 250 MG TABLET azithromycin (ZITHROMAX) 250 MG tablet      Take as directed    Take as directed   CHLORPHENIRAMINE-HYDROCODONE (TUSSIONEX PENNKINETIC ER) 10-8 MG/5ML LQCR chlorpheniramine-HYDROcodone (TUSSIONEX PENNKINETIC ER) 10-8 MG/5ML LQCR      Take 5 mLs by mouth every 12 (twelve) hours.    Take 5 mLs by mouth every 12 (twelve) hours.   LOSARTAN (COZAAR) 100 MG TABLET losartan (COZAAR) 100 MG tablet      Take 1 tablet (100 mg total) by mouth daily.    Take 1 tablet (100 mg total) by mouth daily.   PRAVASTATIN (PRAVACHOL) 40 MG TABLET pravastatin (PRAVACHOL) 40 MG tablet      Take 1 tablet (40 mg total) by mouth daily.    TAKE ONE TABLET AT BEDTIME.   ZOLPIDEM (AMBIEN CR) 12.5 MG CR TABLET zolpidem (AMBIEN CR) 12.5 MG CR tablet      Take 1 tablet (12.5 mg total) by mouth at bedtime as needed for sleep. As needed for insomnia    Take 1 tablet (12.5 mg total) by mouth at bedtime as needed for sleep. As  needed for insomnia   ZOLPIDEM (AMBIEN) 10 MG TABLET zolpidem (AMBIEN) 10 MG tablet      Take 1 tablet (10 mg total) by mouth at bedtime as needed for sleep.    Take 1 tablet (10 mg total) by mouth at bedtime as needed for sleep.  Discontinued Medications   ZOLPIDEM (AMBIEN CR) 12.5 MG CR TABLET    Take 1 tablet (12.5 mg total) by mouth at bedtime as needed for sleep. Patient alternates with Ambien 10 mg.

## 2012-06-05 ENCOUNTER — Telehealth: Payer: Self-pay | Admitting: Pulmonary Disease

## 2012-06-05 MED ORDER — METHYLPREDNISOLONE 4 MG PO KIT: PACK | ORAL | Status: DC

## 2012-06-05 NOTE — Telephone Encounter (Signed)
Called and spoke with pt and he is aware that per SN---medrol dosepak and this has [facility] [place] [place] per pts request.  Nothing further is needed.

## 2012-06-05 NOTE — Telephone Encounter (Signed)
Called and spoke with pt and he stated that this all started Tuesday--  Runny nose Eyes watering Sneezing Using allegra mucinex Nasal congestion is clear Denies any fever  Any further recs from SN.  Pt stated that allergies have not bothered him before.  SN please advise. Thanks  Allergies  Allergen Reactions  . Lisinopril     REACTION: Allergic to ACE inhibitors w/ cough  . Simvastatin     REACTION: pt states ZOCOR caused leg cramps

## 2012-08-15 IMAGING — CR DG CHEST 2V
2 series · 2 of 2 positions shown · non-contrast
Comparison: 05/12/2009

CLINICAL DATA: Physical exam

CHEST - 2 VIEW

[view not recorded (1 of 2)]
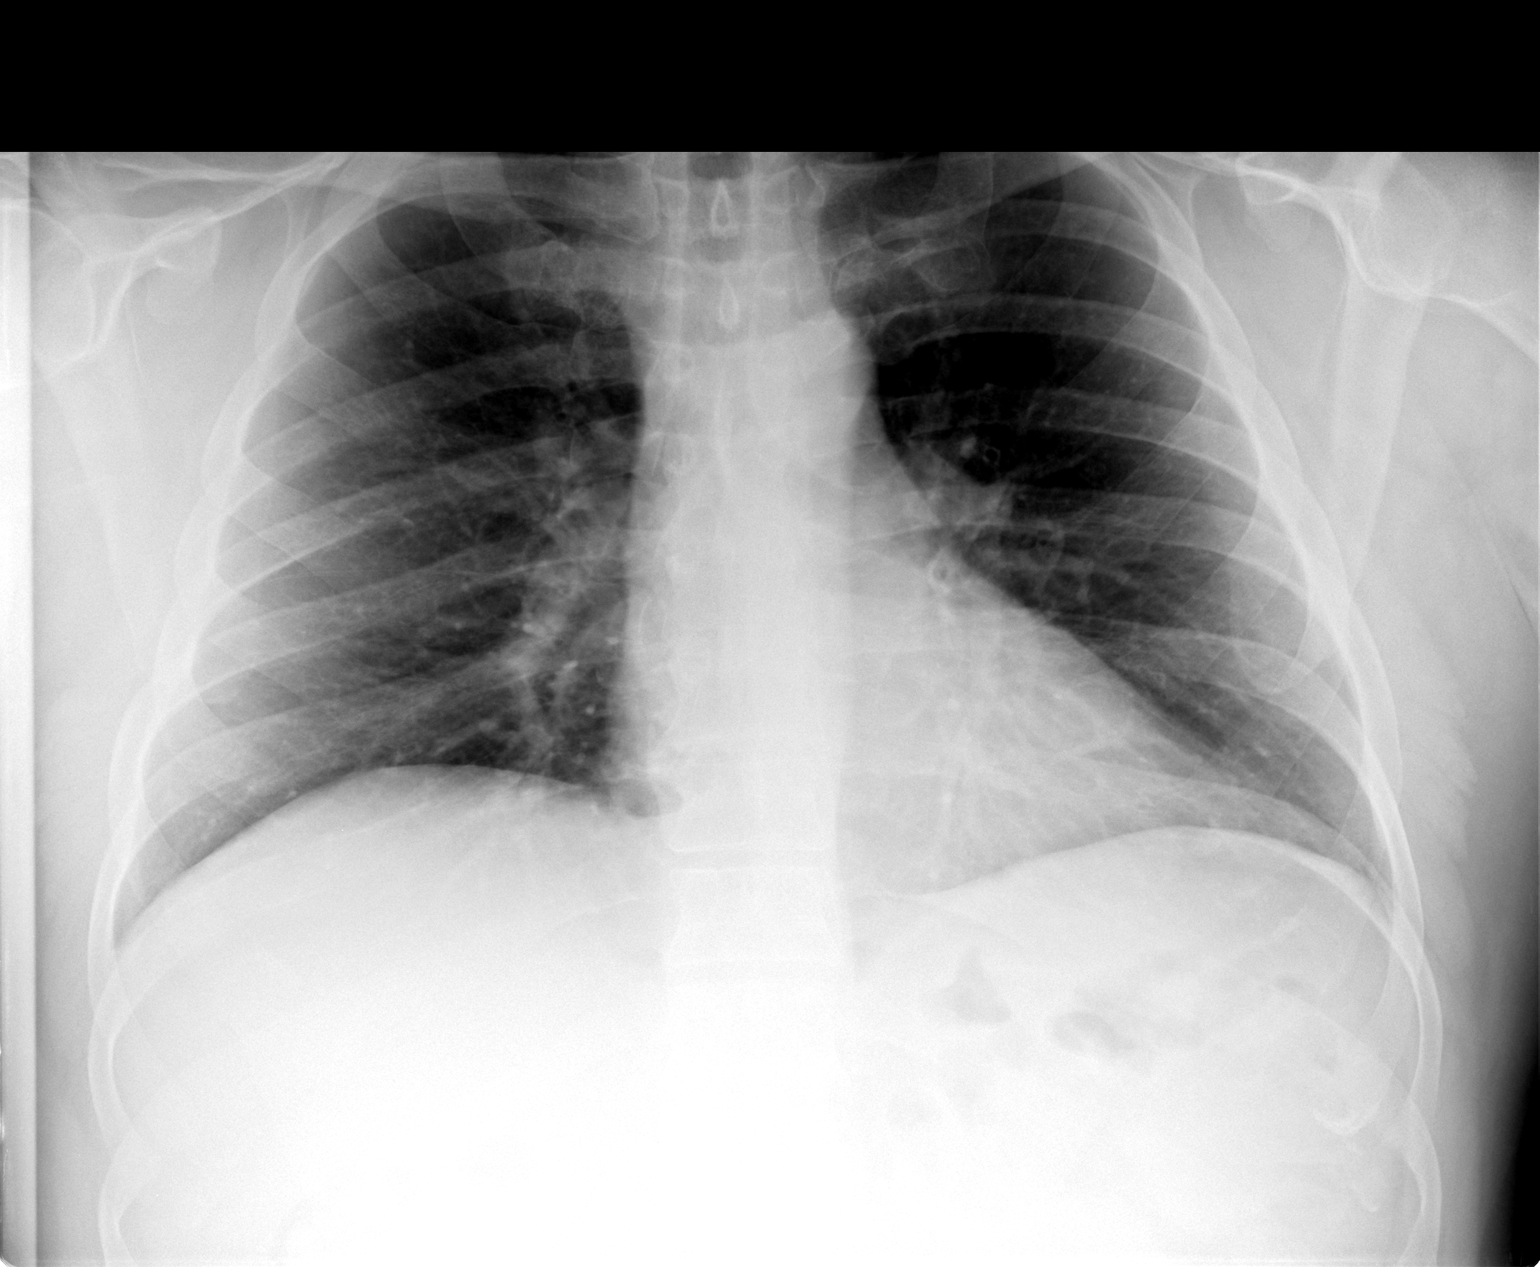

[view not recorded (2 of 2)]
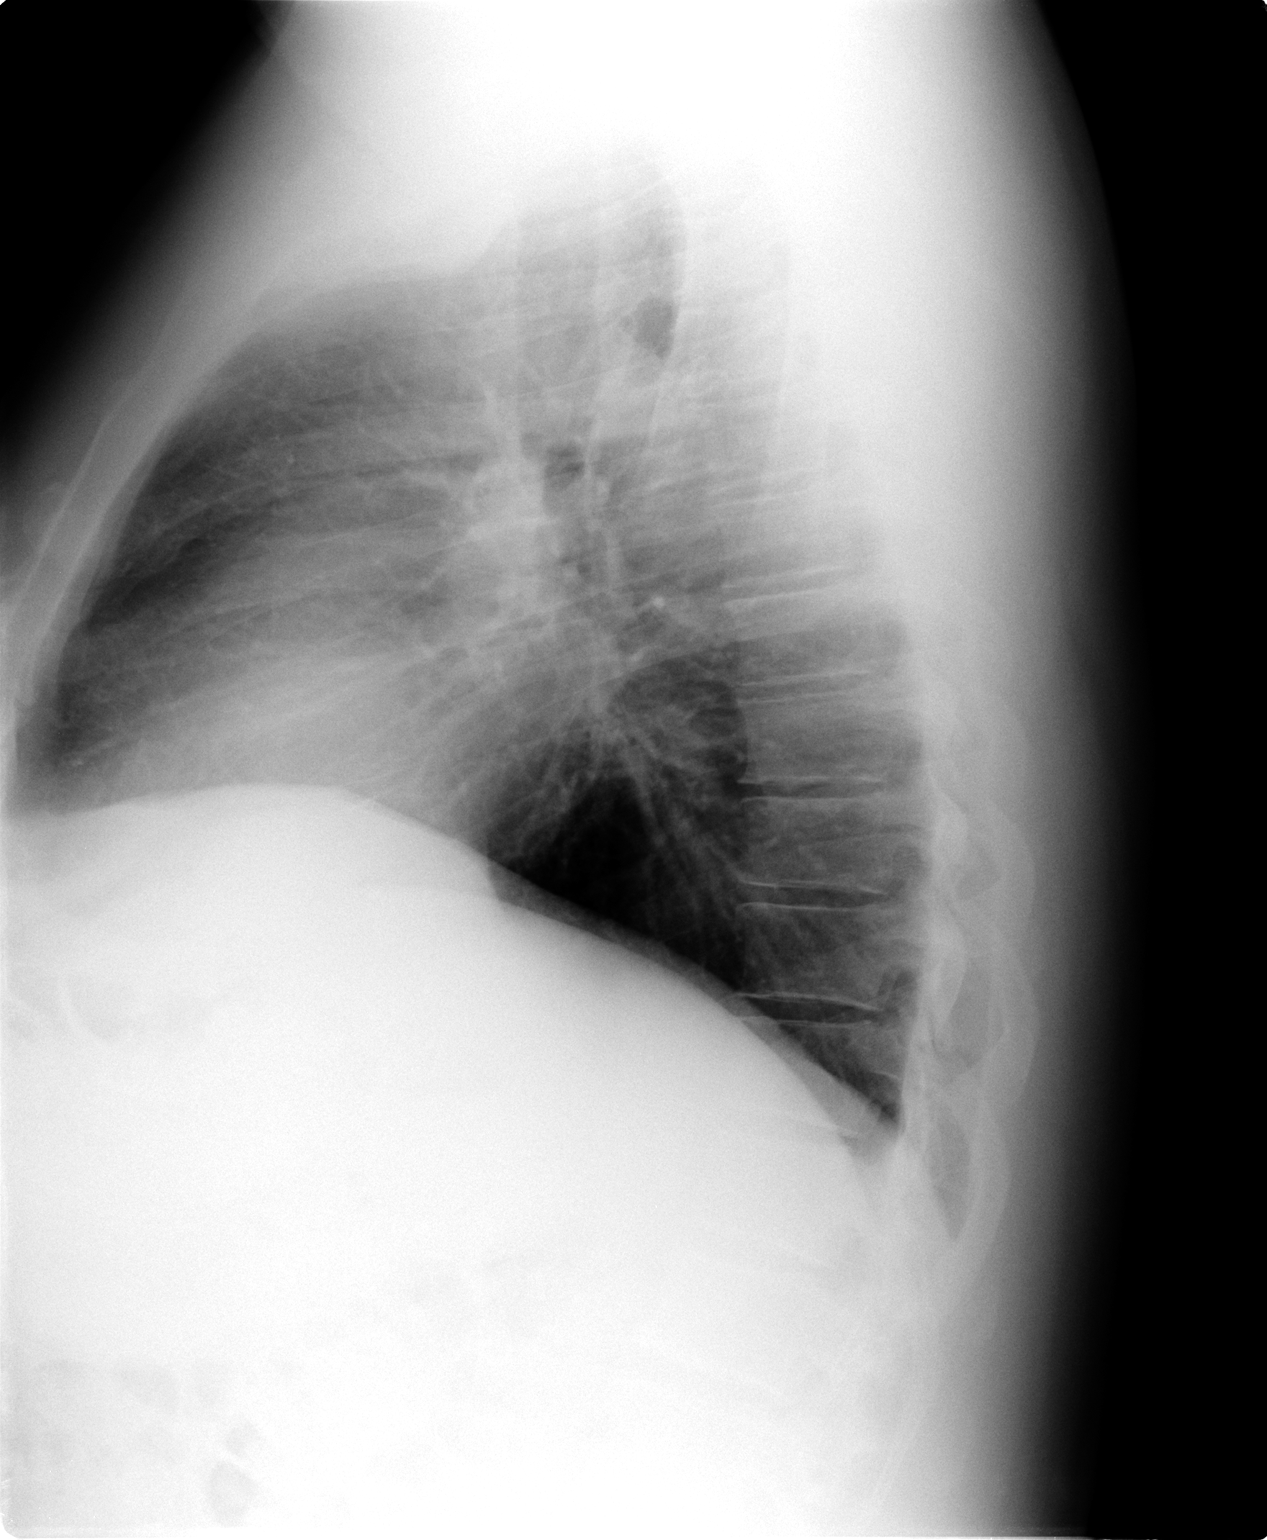

[2 of 2 positions shown; findings below may reference images not displayed]

FINDINGS: Heart size is normal.  Vascularity is normal.  Lungs are
clear without infiltrate or effusion.  Negative for mass lesion.
IMPRESSION: No active cardiopulmonary disease and no interval change.

## 2012-10-14 ENCOUNTER — Other Ambulatory Visit: Payer: Self-pay | Admitting: Pulmonary Disease

## 2012-10-14 MED ORDER — ZOLPIDEM TARTRATE 10 MG PO TABS: 10.0000 mg | ORAL_TABLET | Freq: Every evening | ORAL | Status: DC | PRN

## 2012-10-14 MED ORDER — ZOLPIDEM TARTRATE ER 12.5 MG PO TBCR: 12.5000 mg | EXTENDED_RELEASE_TABLET | Freq: Every evening | ORAL | Status: DC | PRN

## 2012-10-14 NOTE — Telephone Encounter (Signed)
Received faxed refill request from [place] for Ambien 10mg  and Ambien 12.5mg   Both meds last refilled 12.17.13 for #30 w/ 5 refills on each Last ov 12.17.13 w/ SN  Per Leigh, okay to refill both meds w/ 5 additional refills [facility], spoke with pharmacist Selena Batten and gave verbal authorization for meds Med list updated

## 2012-12-02 ENCOUNTER — Other Ambulatory Visit: Payer: Self-pay | Admitting: Pulmonary Disease

## 2012-12-02 MED ORDER — HYDROCOD POLST-CHLORPHEN POLST 10-8 MG/5ML PO LQCR: 5.0000 mL | Freq: Two times a day (BID) | ORAL | Status: DC

## 2013-01-06 ENCOUNTER — Telehealth: Payer: Self-pay | Admitting: Pulmonary Disease

## 2013-01-06 DIAGNOSIS — Z0000 Encounter for general adult medical examination without abnormal findings: Secondary | ICD-10-CM

## 2013-01-06 NOTE — Telephone Encounter (Signed)
Leigh, please have SN advise when patient should come in for labs and what labs to have drawn. Thanks.

## 2013-01-07 LAB — HM COLONOSCOPY

## 2013-01-07 NOTE — Telephone Encounter (Signed)
Labs are in the computer for the pt to come in 1 WEEK prior to his appt.  Labs have been placed in the computer for the pt.  thanks

## 2013-01-07 NOTE — Telephone Encounter (Signed)
Pt advised. Xitlally Mooneyham, CMA  

## 2013-02-17 LAB — HM DIABETES EYE EXAM

## 2013-03-15 ENCOUNTER — Other Ambulatory Visit: Payer: Self-pay | Admitting: Pulmonary Disease

## 2013-03-15 ENCOUNTER — Other Ambulatory Visit (INDEPENDENT_AMBULATORY_CARE_PROVIDER_SITE_OTHER): Payer: BC Managed Care – PPO

## 2013-03-15 DIAGNOSIS — Z0000 Encounter for general adult medical examination without abnormal findings: Secondary | ICD-10-CM

## 2013-03-15 LAB — CBC WITH DIFFERENTIAL/PLATELET
Basophils Absolute: 0 10*3/uL (ref 0.0–0.1)
Basophils Relative: 0.5 % (ref 0.0–3.0)
Eosinophils Relative: 1.9 % (ref 0.0–5.0)
HCT: 47.9 % (ref 39.0–52.0)
Hemoglobin: 16.3 g/dL (ref 13.0–17.0)
Lymphocytes Relative: 28.6 % (ref 12.0–46.0)
Lymphs Abs: 2.5 10*3/uL (ref 0.7–4.0)
MCHC: 34 g/dL (ref 30.0–36.0)
Neutro Abs: 5.1 10*3/uL (ref 1.4–7.7)
Neutrophils Relative %: 58.2 % (ref 43.0–77.0)
Platelets: 212 10*3/uL (ref 150.0–400.0)
RBC: 5.06 Mil/uL (ref 4.22–5.81)
RDW: 13.1 % (ref 11.5–14.6)
WBC: 8.7 10*3/uL (ref 4.5–10.5)

## 2013-03-15 LAB — LIPID PANEL
Cholesterol: 192 mg/dL (ref 0–200)
HDL: 43.2 mg/dL (ref >39.00)
LDL Cholesterol: 116 mg/dL — ABNORMAL HIGH (ref 0–99)
Total CHOL/HDL Ratio: 4
Triglycerides: 162 mg/dL — ABNORMAL HIGH (ref 0.0–149.0)

## 2013-03-15 LAB — BASIC METABOLIC PANEL
BUN: 10 mg/dL (ref 6–23)
Calcium: 9.1 mg/dL (ref 8.4–10.5)
Chloride: 103 mEq/L (ref 96–112)
Creatinine, Ser: 0.9 mg/dL (ref 0.4–1.5)
Sodium: 141 mEq/L (ref 135–145)

## 2013-03-15 LAB — PSA: PSA: 0.76 ng/mL (ref 0.10–4.00)

## 2013-03-15 LAB — HEPATIC FUNCTION PANEL
ALT: 37 U/L (ref 0–53)
AST: 24 U/L (ref 0–37)
Alkaline Phosphatase: 49 U/L (ref 39–117)
Bilirubin, Direct: 0.1 mg/dL (ref 0.0–0.3)
Total Bilirubin: 0.6 mg/dL (ref 0.3–1.2)
Total Protein: 6.9 g/dL (ref 6.0–8.3)

## 2013-03-15 LAB — URINALYSIS
Hgb urine dipstick: NEGATIVE
Ketones, ur: NEGATIVE
Specific Gravity, Urine: 1.025 (ref 1.000–1.030)
Urine Glucose: NEGATIVE
Urobilinogen, UA: 0.2 (ref 0.0–1.0)

## 2013-03-15 LAB — HEMOGLOBIN A1C: Hgb A1c MFr Bld: 6.7 % — ABNORMAL HIGH (ref 4.6–6.5)

## 2013-03-22 ENCOUNTER — Telehealth: Payer: Self-pay | Admitting: Pulmonary Disease

## 2013-03-22 NOTE — Telephone Encounter (Signed)
Pt requesting labs from 03/15/13. Please advise SN thanks

## 2013-03-22 NOTE — Telephone Encounter (Signed)
Per SN----  We usually go over labs at the OV and give pt a copy.  Does he want results now and cancel the appt?

## 2013-03-22 NOTE — Telephone Encounter (Signed)
I called and spoke with pt. He will await his appt. Nothing further needed

## 2013-03-24 ENCOUNTER — Encounter: Payer: Self-pay | Admitting: Pulmonary Disease

## 2013-03-24 ENCOUNTER — Ambulatory Visit (INDEPENDENT_AMBULATORY_CARE_PROVIDER_SITE_OTHER): Payer: BC Managed Care – PPO | Admitting: Pulmonary Disease

## 2013-03-24 ENCOUNTER — Ambulatory Visit (INDEPENDENT_AMBULATORY_CARE_PROVIDER_SITE_OTHER)
Admission: RE | Admit: 2013-03-24 | Discharge: 2013-03-24 | Disposition: A | Discharge status: Home / Self Care | Payer: BC Managed Care – PPO | Source: Ambulatory Visit | Attending: Pulmonary Disease | Admitting: Pulmonary Disease

## 2013-03-24 VITALS — BP 142/98 | HR 72 | Temp 98.2°F | Ht 68.0 in | Wt 262.6 lb

## 2013-03-24 DIAGNOSIS — E119 Type 2 diabetes mellitus without complications: Secondary | ICD-10-CM

## 2013-03-24 DIAGNOSIS — J309 Allergic rhinitis, unspecified: Secondary | ICD-10-CM

## 2013-03-24 DIAGNOSIS — M1990 Unspecified osteoarthritis, unspecified site: Secondary | ICD-10-CM

## 2013-03-24 DIAGNOSIS — I10 Essential (primary) hypertension: Secondary | ICD-10-CM

## 2013-03-24 DIAGNOSIS — Z0000 Encounter for general adult medical examination without abnormal findings: Secondary | ICD-10-CM

## 2013-03-24 DIAGNOSIS — K219 Gastro-esophageal reflux disease without esophagitis: Secondary | ICD-10-CM

## 2013-03-24 DIAGNOSIS — G4700 Insomnia, unspecified: Secondary | ICD-10-CM

## 2013-03-24 DIAGNOSIS — E7800 Pure hypercholesterolemia, unspecified: Secondary | ICD-10-CM

## 2013-03-24 DIAGNOSIS — E669 Obesity, unspecified: Secondary | ICD-10-CM

## 2013-03-24 MED ORDER — AMLODIPINE BESYLATE 5 MG PO TABS: ORAL_TABLET | ORAL | Status: DC

## 2013-03-24 MED ORDER — LOSARTAN POTASSIUM 100 MG PO TABS: 100.0000 mg | ORAL_TABLET | Freq: Every day | ORAL | Status: DC

## 2013-03-24 MED ORDER — ZOLPIDEM TARTRATE ER 12.5 MG PO TBCR: 12.5000 mg | EXTENDED_RELEASE_TABLET | Freq: Every evening | ORAL | Status: DC | PRN

## 2013-03-24 MED ORDER — ZOLPIDEM TARTRATE 10 MG PO TABS: 10.0000 mg | ORAL_TABLET | Freq: Every evening | ORAL | Status: DC | PRN

## 2013-03-24 MED ORDER — AZITHROMYCIN 250 MG PO TABS: ORAL_TABLET | ORAL | Status: DC

## 2013-03-24 MED ORDER — PRAVASTATIN SODIUM 40 MG PO TABS: 40.0000 mg | ORAL_TABLET | Freq: Every day | ORAL | Status: DC

## 2013-03-24 NOTE — Patient Instructions (Signed)
Today we updated your med list in our EPIC system...    Continue your current medications the same...    We refilled your med per request...  Today we rechecked your CXR & EKG...    We will contact you w/ the results when available...   We reviewed your fasting labs and gave you a copy...    You need to get on track w/ your low carb, low fat diet & get the weight down to avoid DM meds in the future...  We wrote a prescription for the Shingles vaccine...  Call for any questions...  Let's plan a follow up visit in 24mo to recheck your weight, BP, Chol, & DM labs.Marland KitchenMarland Kitchen

## 2013-03-24 NOTE — Progress Notes (Signed)
Subjective:    Patient ID: Devin Roberts, male    DOB: [birth year], 52 y.o.   MRN: [id number]  HPI 52 y/o WM, husb of Devin Roberts, here for a follow up visit & CPX...  ~  January 15, 2011:  Yearly ROV & CPX> Devin Roberts has had a good year, no new complaints or concerns, doing well overall on current regimen; unfortunately he has not lost any weight but he admits to not being on much of a diet;  He indicates that his wife wants copy of his lab work, Rx for [employer] & Tussionex to have on hand, and CXR/ PSA/ stool cards today; he had the Flu shot at work...    AR/ AB> on Allegra180 as needed for the pollen allergy; breathing has been good, no resp exac, no regular meds required, he uses Mucinex as needed...    HBP> on Diovan160 & Amlodipine5; BP= 134/78 & similar at home he says; denies CP, palpit, syncope, SOB, edema, etc...    CHOL> on Pravastatin40 but not much of a diet; FLP looks good but needs to lose weight...    DM> on diet alone & BS=98, A1c not done; needs to restrict Carbs 7 get wt down thru combo of diet & exercise...    Overweight> weight = 240# down 3# over the last yr...    DJD> on OTC analgesics (& Motrin600) as needed; we again reviewed exercise program etc...    Insomnia> he uses BOTH Ambien10 & AmbienCR at diff times based on his sleep/wake cycle w/ shift workers sleep disorder (63yrs rotating swing shifts).  ~  March 10, 2012:  47mo ROV & CPX... Devin Roberts indicates that this has been a big life changing yr- his job moved to [employer] he has enrolled at [employer] to [employer]...    AR/ AB> on Allegra180, Tussionex prn & wants a ZPak to keep on hand; no recent resp exac & denies cough, sput, hemoptysis, SOB, CP, etc...    HBP> on Amlod5, Losar100; BP= 144/98 w/ reg cuff but he's gained 10# & not restricting sodium; we discussed all diet issues, must get wt down; he says BP is better at home...    Chol> on Prav40; FLP shows TChol 137, TG 106, HDL 34, LDL 82    DM> on  diet alone; Labs show BS=115 & we reviewed low carb wt reducing diet...    Obesity> wt is up 10# to 250#; we discussed diet, exercise, wt reduction strategies...    GI- GERD, need for screening colon> he knows he can take Prilosec20 vs Zantac OTC as needed...    DJD> on Ibuprofen prn; he admits to some shoulder pain & OTC meds help...    Chr persistent insomnia> he insists on BOTH Ambien10 & AmbienCR12.5; prev had shift workers sleep disorder but now going to [employer] for computer classes & career change... We reviewed prob list, meds, xrays and labs> see below for updates >> he had the Flu vaccine 10/13... CXR 12/13 showed normal heart size, clear lungs, NAD.Marland KitchenMarland Kitchen EKG 12/13 showed NSR, rate67, wnl, NAD... LABS 12/13:  FLP- at goals on Prav40 x HDL=34, Rec incr exerc;  Chems- wnl x BS=115; CBC- wnl;  TSH=0.69;  PSA=0.63;  UA- clear...  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 He brings a note from his wife Devin  Roberts- she wants him to have CXR, EKG, & she wants copy of his blood work, plus a Rx for shingles vaccine- done... We reviewed the following medical problems during today's office visit >>     AR/ AB> on Allegra180, Tussionex prn & wants a ZPak to keep on hand; no recent resp exac & denies cough, sput, hemoptysis, SOB, CP, etc...    HBP> on Amlod5, Losar100; BP= 142/98 w/ reg cuff but he's gained 13# more & not restricting sodium; we discussed all diet issues, must get wt down; he says BP is better at home...    Chol> on Prav40; FLP 12/14 showed  TChol 192, TG 162, HDL 43, LDL 116... Rec- must diet, exercise, get wt down!    DM> on diet alone; Labs show BS=122 & A1c=6.7; we reviewed low carb wt reducing diet...    Obesity> wt is up 13# to 263# w/ BMI~40; we discussed diet, exercise, wt reduction strategies...    GI- GERD, divertics, colon polyp> he knows he can  take Prilosec20 vs Zantac OTC as needed; he had screening colonoscopy 5/14 by [facility]- 2 polyps removed, divertics; ?path- pt told to f/u 88yrs...    DJD> on Ibuprofen prn; he admits to some shoulder pain & OTC meds help...    Chr persistent insomnia> he insists on BOTH Ambien10 & AmbienCR12.5; prev had shift workers sleep disorder but now going to [employer] for computer classes & career change... We reviewed prob list, meds, xrays and labs> see below for updates >> he had the 2014 Flu vaccine in Nov... CXR 12/14 showed normal heart size, clear lungs, arthritis in Tspine, NAD.Marland KitchenMarland Kitchen EKG 12/14 showed NSR, rate71, wnl, NAD... LABS 12/14:  FLP- ok on Prav40 w/ LDL=116;  chems- ok x BS=122, A1c=6.7;  CBC- wnl;  TSH=1.20;  PSA=0.76...           Problem List:   ALLERGIC RHINITIS (ICD-477.9) - he uses ALLEGRA 180mg /d as needed...  ASTHMATIC BRONCHITIS, ACUTE (ICD-466.0) - he has had exercise-induced asthma in the past> no regular meds required; he denies cough, phlegm, dyspnea, CP... ~  2/10:  treated at [facility] for "asthmatic bronchits" w/ Omnicef & Pred... BS incr to 225, & cough did not resolve... Lisinopril was stopped w/ resolution of cough. ~  CXR 12/14 showed normal heart size, clear lungs, arthritis in Tspine, NAD... ~  Wife likes to have ZPak & Tussionex on hand for Prn use...  HYPERTENSION (ICD-401.9) - on AMLODIPINE 5mg /d, & DIOVAN 160mg /d... prev Lisinopril stopped in 2010 due to ACE cough & given Exforge w/ control of BP but insurance wouldn't cover it- switched to Diovan+Amlodipine...  ~  BP well controlled, tolerates meds well- denies HA, fatigue, visual changes, CP, palipit, dizziness, syncope, dyspnea, edema, etc...  ~  12/13: on Amlod5, Losar100; BP= 144/98 w/ reg cuff but he's gained 10# & not restricting sodium; we discussed all diet issues, must get wt down; he says BP is better at home.  ~  12/14: on Amlod5, Losar100; BP= 142/98 w/ reg cuff but he's gained 13# more & not restricting sodium;  we discussed all diet issues, must get wt down; he says BP is better at home.  HYPERCHOLESTEROLEMIA (ICD-272.0) - on PRAVASTATIN 40mg /d since 2009 (prev on Simva80 but intol w/ leg cramps). ~  FLP 8/06 on diet showed TChol 232, TG 117, HDL28, LDL 168... Simva80 started. ~  FLP 9/07 on Simva80 showed TChol 149, TG 41, HDL 43, LDL 98 ~  FLP 11/08 on Simva80 showed  TChol 176, TG 67, HDL 45, LDL 118 ~  FLP 2/10 on Prav40 showed TChol 171, TG 231, HDL 48, LDL 98... rec- same med, better low fat diet. ~  FLP 2/11 on Prav40 showed TChol 156, TG 42, HDL 48, LDL 100 ~  FLP 10/11 on Prav40 showed TChol 164, TG 113, HDL 41, LDL 101 ~  FLP 10/12 on Prav40 showed TChol 151, TG 53, HDL 45, LDL 95 ~  FLP 12/13 on Prav40 showed TChol 137, TG 106, HDL 34, LDL 82  ~  FLP 12/14 on Prav40 showed TChol 192, TG 162, HDL 43, LDL 116... Needs better diet & wt reduction...  DIABETES MELLITUS (ICD-250.00) - new dx 2/10 w/ labs showing FBS= 225 (on Pred from [facility])> recheck off Pred 3/10 showed BS= 122, HgA1c= 6.6.Marland KitchenMarland Kitchen rec- diet + exercise, get weight down! ~  NOTE:  prev FBS's from 2000-2008 were 95 - 131 ~  labs 2/11 showed BS= 122, A1c= 6.0.Marland KitchenMarland Kitchen needs weight reduction. ~  labs 10/11 on diet alone showed BS= 97 ~  Labs 10/12 on diet alone showed BS= 98 ~  Labs 12/13 on diet alone showed BS= 115 ~  Labs 12/14 on diet alone showed BS= 122, A1c= 6.7  OBESITY (ICD-278.00) - he was referred to [facility] in 2005 for diet counselling, but he never went>  finally got serious about wt reduction in 2010 & steadily improved for a while, now off diet & weight stagnant> we reviewed need for diet/ exercise/ wt reduction... ~  11/08 weight = 255#,  5\' 8"  tall,  BMI= 38... ~  3/10 weight = 268# & lost to 253# in 3 weeks! ~  2/11 weight = 253#... he needs to do better! ~  10/11 weight = 243# ~  10/12 weight = 240# ~  12/13 weight = 250# ~  12/14: weight = 263#  GERD (ICD-530.81) - hx reflux symptoms in the past... prev  Rx w/ OTC antacids, H2 blockers, etc... he saw DrByers in 2002 for hoarseness- believed related to reflux... no previous work up and no active symptoms at present... ~  12/13: he is now 52 y/o & in need of screening colonoscopy- wife says she will sched this for him...  DIVERTICULOSIS & COLON POLYPS >> followed by [facility] for GI... ~  he had screening colonoscopy 5/14 by [facility]- 2 polyps removed, divertics; ?path- pt told to f/u 54yrs  DEGENERATIVE JOINT DISEASE (ICD-715.90) - he's had pain in shoulders & takes IBUPROFEN 600mg  Tid...  HEADACHE (ICD-784.0) - he saw DrAdelman in 1990 for muscle contraction HA's...  INSOMNIA, CHRONIC (ICD-307.42) - he requires AMBIEN for rest due to his Shift Workers Sleep Disorder (he wants BOTH AMBIEN 10mg  & AMBIEN CR 12.5mg  tabs...  HEALTH MAINTENANCE: ~  GI:  He will need GI eval & screening colon at 51; rectal= neg, stool heme neg; wife wants him to have stool cards as well... ~  GU:  DRE neg & PSA= 0.63... ~  Immuniz:  He gets the seasonal Flu vaccines at work each yr;  ?last Tetanus shot...   History reviewed. No pertinent past surgical history.   Outpatient Encounter Prescriptions as of 03/24/2013  Medication Sig  . amLODipine (NORVASC) 5 MG tablet TAKE 1 TABLET EACH DAY.  Marland Kitchen azithromycin (ZITHROMAX) 250 MG tablet Take as directed  . chlorpheniramine-HYDROcodone (TUSSIONEX PENNKINETIC ER) 10-8 MG/5ML LQCR Take 5 mLs by mouth every 12 (twelve) hours.  . fexofenadine (ALLEGRA) 180 MG tablet Take 180 mg by mouth daily  as needed.    Marland Kitchen ibuprofen (ADVIL,MOTRIN) 600 MG tablet Take 1 tablet (600 mg total) by mouth every 6 (six) hours as needed.  Marland Kitchen losartan (COZAAR) 100 MG tablet Take 1 tablet (100 mg total) by mouth daily.  . pravastatin (PRAVACHOL) 40 MG tablet Take 1 tablet (40 mg total) by mouth daily.  Marland Kitchen zolpidem (AMBIEN CR) 12.5 MG CR tablet Take 1 tablet (12.5 mg total) by mouth at bedtime as needed for sleep. As needed for insomnia  . zolpidem  (AMBIEN) 10 MG tablet Take 1 tablet (10 mg total) by mouth at bedtime as needed for sleep.  . [DISCONTINUED] methylPREDNISolone (MEDROL, PAK,) 4 MG tablet follow package directions    Allergies  Allergen Reactions  . Lisinopril     REACTION: Allergic to ACE inhibitors w/ cough  . Simvastatin     REACTION: pt states ZOCOR caused leg cramps    Current Medications, Allergies, Past Medical History, Past Surgical History, Family History, and Social History were reviewed in [employer] record.    Review of Systems         See HPI - all other systems neg except as noted... The patient denies anorexia, fever, weight loss, weight gain, vision loss, decreased hearing, hoarseness, chest pain, syncope, dyspnea on exertion, peripheral edema, prolonged cough, headaches, hemoptysis, abdominal pain, melena, hematochezia, severe indigestion/heartburn, hematuria, incontinence, muscle weakness, suspicious skin lesions, transient blindness, difficulty walking, depression, unusual weight change, abnormal bleeding, enlarged lymph nodes, and angioedema.     Objective:   Physical Exam     WD, Obese, 52 y/o WM in NAD... GENERAL:  Alert & oriented; pleasant & cooperative... HEENT:  [place]/AT, EOM-wnl, PERRLA, EACs-clear, TMs-wnl, NOSE-clear, THROAT-clear & wnl. NECK:  Supple w/ full ROM; no JVD; normal carotid impulses w/o bruits; no thyromegaly or nodules palpated; no lymphadenopathy. CHEST:  Clear to P & A; without wheezes/ rales/ or rhonchi. HEART:  Regular Rhythm; without murmurs/ rubs/ or gallops. ABDOMEN:  Soft & nontender; normal bowel sounds; no organomegaly or masses detected. EXT: without deformities or arthritic changes; no varicose veins/ venous insuffic/ or edema. NEURO:  CN's intact;  no focal neuro deficits... DERM:  No lesions noted; no rash etc...  RADIOLOGY DATA:  Reviewed in the EPIC EMR & discussed w/ the patient...  LABORATORY DATA:  Reviewed in the EPIC EMR &  discussed w/ the patient...   Assessment & Plan:    AR/ AB> on Allegra180 as needed for the pollen allergy; breathing has been good, no resp exac, no regular meds required, he uses Mucinex as needed...     HBP> on Diovan160 & Amlodipine5; BP is fair controlled, continue same Rx, must lose the weight...     CHOL> on Pravastatin40 but not much of a diet; FLP looks good but needs to lose weight...     DM> on diet alone & BS=122, A1c=6.7; needs to restrict Carbs & get wt down thru combo of diet & exercise...     Overweight> weight = 240# down 3# over the last yr...      DJD> on OTC analgesics (& Motrin600) as needed; we again reviewed exercise program etc...     Insomnia> he uses BOTH Ambien10 & AmbienCR at diff times based on his sleep/wake cycle w/ shift workers sleep disorder (110yrs rotating swing shifts).   Patient's Medications  New Prescriptions   No medications on file  Previous Medications   CHLORPHENIRAMINE-HYDROCODONE (TUSSIONEX PENNKINETIC ER) 10-8 MG/5ML LQCR    Take 5 mLs  by mouth every 12 (twelve) hours.   FEXOFENADINE (ALLEGRA) 180 MG TABLET    Take 180 mg by mouth daily as needed.     IBUPROFEN (ADVIL,MOTRIN) 600 MG TABLET    Take 1 tablet (600 mg total) by mouth every 6 (six) hours as needed.  Modified Medications   Modified Medication Previous Medication   AMLODIPINE (NORVASC) 5 MG TABLET amLODipine (NORVASC) 5 MG tablet      TAKE 1 TABLET EACH DAY.    TAKE 1 TABLET EACH DAY.   AZITHROMYCIN (ZITHROMAX) 250 MG TABLET azithromycin (ZITHROMAX) 250 MG tablet      Take as directed    Take as directed   LOSARTAN (COZAAR) 100 MG TABLET losartan (COZAAR) 100 MG tablet      Take 1 tablet (100 mg total) by mouth daily.    Take 1 tablet (100 mg total) by mouth daily.   PRAVASTATIN (PRAVACHOL) 40 MG TABLET pravastatin (PRAVACHOL) 40 MG tablet      Take 1 tablet (40 mg total) by mouth daily.    Take 1 tablet (40 mg total) by mouth daily.   ZOLPIDEM (AMBIEN CR) 12.5 MG CR TABLET  zolpidem (AMBIEN CR) 12.5 MG CR tablet      Take 1 tablet (12.5 mg total) by mouth at bedtime as needed for sleep. As needed for insomnia    Take 1 tablet (12.5 mg total) by mouth at bedtime as needed for sleep. As needed for insomnia   ZOLPIDEM (AMBIEN) 10 MG TABLET zolpidem (AMBIEN) 10 MG tablet      Take 1 tablet (10 mg total) by mouth at bedtime as needed for sleep.    Take 1 tablet (10 mg total) by mouth at bedtime as needed for sleep.  Discontinued Medications   METHYLPREDNISOLONE (MEDROL, PAK,) 4 MG TABLET    follow package directions

## 2013-03-29 ENCOUNTER — Ambulatory Visit (INDEPENDENT_AMBULATORY_CARE_PROVIDER_SITE_OTHER): Payer: BC Managed Care – PPO | Admitting: Podiatry

## 2013-03-29 ENCOUNTER — Encounter: Payer: Self-pay | Admitting: Podiatry

## 2013-03-29 VITALS — BP 177/108 | HR 83 | Resp 16

## 2013-03-29 DIAGNOSIS — M7750 Other enthesopathy of unspecified foot: Secondary | ICD-10-CM

## 2013-03-29 NOTE — Patient Instructions (Signed)
Our office will notify you once your orthotics arrive. At that time an appointment will be needed to pick them up.  

## 2013-03-30 NOTE — Progress Notes (Signed)
Subjective:     Patient ID: Devin Roberts, male   DOB: 10/20/1960, 53 y.o.   MRN: [id number]  HPI patient states that my feet are okay but I need new orthotics   Review of Systems     Objective:   Physical Exam Neurovascular status intact with no health history changes noted and discomfort of a mild nature plantar aspect of both feet secondary to foot structure    Assessment:     Chronic tendinitis with inflammation of both feet controlled with orthotics    Plan:     Reviewed foot condition and discussed options. Today we scanned for custom orthotics to reduce stress against the and advised on recovering his second pair for the future

## 2013-04-20 ENCOUNTER — Telehealth: Payer: Self-pay | Admitting: *Deleted

## 2013-04-20 NOTE — Telephone Encounter (Signed)
Per Morrie SheldonAshley, pt or pts wife can come by to [place]; pt states its his 3rd pair.

## 2013-04-20 NOTE — Telephone Encounter (Signed)
Pt's wife asked if orthotics were in.  I informed her, orthotic were here and pt would need an appt.  I referred to schedulers.

## 2013-04-21 ENCOUNTER — Encounter: Payer: Self-pay | Admitting: Podiatry

## 2013-04-26 ENCOUNTER — Encounter: Payer: Self-pay | Admitting: Podiatry

## 2013-04-26 DIAGNOSIS — M722 Plantar fascial fibromatosis: Secondary | ICD-10-CM

## 2013-05-26 ENCOUNTER — Encounter: Payer: Self-pay | Admitting: Podiatry

## 2013-09-02 ENCOUNTER — Ambulatory Visit: Payer: BC Managed Care – PPO | Admitting: Internal Medicine

## 2013-10-21 ENCOUNTER — Other Ambulatory Visit: Payer: Self-pay | Admitting: Internal Medicine

## 2013-10-21 ENCOUNTER — Telehealth: Payer: Self-pay

## 2013-10-21 MED ORDER — ZOLPIDEM TARTRATE 10 MG PO TABS: 10.0000 mg | ORAL_TABLET | Freq: Every evening | ORAL | Status: DC | PRN

## 2013-10-21 NOTE — Telephone Encounter (Signed)
Pt is requesting refill for Ambien 10 mg qty 30.  Pt has not been seen since 03/24/2013 (dr. Kriste BasqueNadel).  Pt has an upcoming appt w/ you on 12/21/2013.  Please advise

## 2013-10-21 NOTE — Telephone Encounter (Signed)
done

## 2013-10-28 ENCOUNTER — Ambulatory Visit: Payer: BC Managed Care – PPO | Admitting: Internal Medicine

## 2013-12-21 ENCOUNTER — Ambulatory Visit: Payer: BC Managed Care – PPO | Admitting: Internal Medicine

## 2014-01-05 ENCOUNTER — Ambulatory Visit
Admission: RE | Admit: 2014-01-05 | Discharge: 2014-01-05 | Disposition: A | Discharge status: Home / Self Care | Payer: BC Managed Care – PPO | Source: Ambulatory Visit | Attending: Internal Medicine | Admitting: Internal Medicine

## 2014-01-05 ENCOUNTER — Ambulatory Visit (INDEPENDENT_AMBULATORY_CARE_PROVIDER_SITE_OTHER): Payer: BC Managed Care – PPO | Admitting: Internal Medicine

## 2014-01-05 ENCOUNTER — Encounter: Payer: Self-pay | Admitting: Internal Medicine

## 2014-01-05 ENCOUNTER — Other Ambulatory Visit (INDEPENDENT_AMBULATORY_CARE_PROVIDER_SITE_OTHER): Payer: BC Managed Care – PPO

## 2014-01-05 VITALS — BP 160/120 | HR 65 | Temp 98.1°F | Resp 16 | Ht 68.0 in | Wt 243.0 lb

## 2014-01-05 DIAGNOSIS — E785 Hyperlipidemia, unspecified: Secondary | ICD-10-CM

## 2014-01-05 DIAGNOSIS — J301 Allergic rhinitis due to pollen: Secondary | ICD-10-CM

## 2014-01-05 DIAGNOSIS — I10 Essential (primary) hypertension: Secondary | ICD-10-CM

## 2014-01-05 DIAGNOSIS — E119 Type 2 diabetes mellitus without complications: Secondary | ICD-10-CM

## 2014-01-05 DIAGNOSIS — R0683 Snoring: Secondary | ICD-10-CM

## 2014-01-05 DIAGNOSIS — Z23 Encounter for immunization: Secondary | ICD-10-CM

## 2014-01-05 DIAGNOSIS — K219 Gastro-esophageal reflux disease without esophagitis: Secondary | ICD-10-CM

## 2014-01-05 LAB — CBC WITH DIFFERENTIAL/PLATELET
Basophils Absolute: 0 10*3/uL (ref 0.0–0.1)
Basophils Relative: 0.4 % (ref 0.0–3.0)
EOS PCT: 0.8 % (ref 0.0–5.0)
Eosinophils Absolute: 0.1 10*3/uL (ref 0.0–0.7)
HCT: 50 % (ref 39.0–52.0)
HEMOGLOBIN: 16.7 g/dL (ref 13.0–17.0)
Lymphocytes Relative: 19.4 % (ref 12.0–46.0)
Lymphs Abs: 2.4 10*3/uL (ref 0.7–4.0)
MCHC: 33.3 g/dL (ref 30.0–36.0)
MCV: 94.9 fl (ref 78.0–100.0)
MONO ABS: 0.9 10*3/uL (ref 0.1–1.0)
MONOS PCT: 7.1 % (ref 3.0–12.0)
NEUTROS ABS: 8.8 10*3/uL — AB (ref 1.4–7.7)
Neutrophils Relative %: 72.3 % (ref 43.0–77.0)
PLATELETS: 217 10*3/uL (ref 150.0–400.0)
RBC: 5.27 Mil/uL (ref 4.22–5.81)
RDW: 12.6 % (ref 11.5–15.5)
WBC: 12.2 10*3/uL — AB (ref 4.0–10.5)

## 2014-01-05 LAB — URINALYSIS, ROUTINE W REFLEX MICROSCOPIC
Bilirubin Urine: NEGATIVE
Hgb urine dipstick: NEGATIVE
Ketones, ur: NEGATIVE
LEUKOCYTES UA: NEGATIVE
NITRITE: NEGATIVE
PH: 6 (ref 5.0–8.0)
SPECIFIC GRAVITY, URINE: 1.025 (ref 1.000–1.030)
Total Protein, Urine: NEGATIVE
UROBILINOGEN UA: 1 (ref 0.0–1.0)
Urine Glucose: NEGATIVE

## 2014-01-05 LAB — HM DIABETES FOOT EXAM

## 2014-01-05 LAB — HEMOGLOBIN A1C: Hgb A1c MFr Bld: 6.2 % (ref 4.6–6.5)

## 2014-01-05 MED ORDER — AZILSARTAN-CHLORTHALIDONE 40-12.5 MG PO TABS: 1.0000 | ORAL_TABLET | Freq: Every day | ORAL | Status: DC

## 2014-01-05 NOTE — Patient Instructions (Signed)

## 2014-01-05 NOTE — Progress Notes (Signed)
Subjective:    Patient ID: Devin Roberts, male    DOB: 07/20/1960, 53 y.o.   MRN: [id number]  Hypertension This is a chronic problem. The current episode started more than 1 year ago. The problem has been gradually worsening since onset. The problem is uncontrolled. Associated symptoms include anxiety. Pertinent negatives include no chest pain, headaches, malaise/fatigue, neck pain, orthopnea, palpitations, peripheral edema, PND, shortness of breath or sweats. Agents associated with hypertension include NSAIDs. Risk factors for coronary artery disease include obesity and male gender. Past treatments include angiotensin blockers. The current treatment provides mild improvement. Compliance problems include diet, exercise and psychosocial issues.       Review of Systems  Constitutional: Negative.  Negative for fever, chills, malaise/fatigue, diaphoresis, activity change, appetite change, fatigue and unexpected weight change.  HENT: Negative.   Eyes: Negative.   Respiratory: Positive for apnea (heavy snoring). Negative for cough, choking, chest tightness, shortness of breath, wheezing and stridor.   Cardiovascular: Negative.  Negative for chest pain, palpitations, orthopnea, leg swelling and PND.  Gastrointestinal: Negative.  Negative for nausea, vomiting, abdominal pain, diarrhea, constipation and blood in stool.  Endocrine: Negative.  Negative for polydipsia, polyphagia and polyuria.  Genitourinary: Negative.  Negative for urgency, hematuria, flank pain, decreased urine volume and difficulty urinating.  Musculoskeletal: Negative.  Negative for arthralgias, back pain, gait problem, joint swelling, myalgias, neck pain and neck stiffness.  Skin: Negative.  Negative for rash.  Allergic/Immunologic: Negative.   Neurological: Negative.  Negative for dizziness, seizures, speech difficulty, light-headedness and headaches.  Hematological: Negative.  Negative for adenopathy. Does not bruise/bleed  easily.  Psychiatric/Behavioral: Positive for sleep disturbance and dysphoric mood. Negative for suicidal ideas, hallucinations, behavioral problems, confusion, self-injury, decreased concentration and agitation. The patient is nervous/anxious. The patient is not hyperactive.        Objective:   Physical Exam  Vitals reviewed. Constitutional: He is oriented to person, place, and time. He appears well-developed and well-nourished. No distress.  HENT:  Head: Normocephalic and atraumatic.  Mouth/Throat: Oropharynx is clear and moist. No oropharyngeal exudate.  Eyes: Conjunctivae are normal. Right eye exhibits no discharge. Left eye exhibits no discharge. No scleral icterus.  Neck: Normal range of motion. Neck supple. No JVD present. No tracheal deviation present. No thyromegaly present.  Cardiovascular: Normal rate, regular rhythm, normal heart sounds and intact distal pulses.  Exam reveals no gallop and no friction rub.   No murmur heard. Pulmonary/Chest: Effort normal and breath sounds normal. No stridor. No respiratory distress. He has no wheezes. He has no rales. He exhibits no tenderness.  Abdominal: Soft. Bowel sounds are normal. He exhibits no distension and no mass. There is no tenderness. There is no rebound and no guarding.  Musculoskeletal: Normal range of motion. He exhibits no edema and no tenderness.  Lymphadenopathy:    He has no cervical adenopathy.  Neurological: He is oriented to person, place, and time.  Skin: Skin is warm and dry. No rash noted. He is not diaphoretic. No erythema. No pallor.     Lab Results  Component Value Date   WBC 8.7 03/15/2013   HGB 16.3 03/15/2013   HCT 47.9 03/15/2013   PLT 212.0 03/15/2013   GLUCOSE 122* 03/15/2013   CHOL 192 03/15/2013   TRIG 162.0* 03/15/2013   HDL 43.20 03/15/2013   LDLDIRECT 97.5 05/19/2008   LDLCALC 116* 03/15/2013   ALT 37 03/15/2013   AST 24 03/15/2013   NA 141 03/15/2013   K 4.0 03/15/2013  CL 103 03/15/2013    CREATININE 0.9 03/15/2013   BUN 10 03/15/2013   CO2 31 03/15/2013   TSH 1.20 03/15/2013   PSA 0.76 03/15/2013   HGBA1C 6.7* 03/15/2013       Assessment & Plan:

## 2014-01-05 NOTE — Progress Notes (Signed)
Pre visit review using our clinic review tool, if applicable. No additional management support is needed unless otherwise documented below in the visit note. 

## 2014-01-06 ENCOUNTER — Encounter: Payer: Self-pay | Admitting: Internal Medicine

## 2014-01-06 ENCOUNTER — Telehealth: Payer: Self-pay | Admitting: *Deleted

## 2014-01-06 ENCOUNTER — Telehealth: Payer: Self-pay | Admitting: Pulmonary Disease

## 2014-01-06 ENCOUNTER — Telehealth: Payer: Self-pay

## 2014-01-06 DIAGNOSIS — R0683 Snoring: Secondary | ICD-10-CM | POA: Insufficient documentation

## 2014-01-06 LAB — COMPREHENSIVE METABOLIC PANEL
ALK PHOS: 52 U/L (ref 39–117)
ALT: 27 U/L (ref 0–53)
AST: 25 U/L (ref 0–37)
Albumin: 4 g/dL (ref 3.5–5.2)
BUN: 9 mg/dL (ref 6–23)
CO2: 30 meq/L (ref 19–32)
CREATININE: 1.1 mg/dL (ref 0.4–1.5)
Calcium: 9.3 mg/dL (ref 8.4–10.5)
Chloride: 102 mEq/L (ref 96–112)
GFR: 78.02 mL/min (ref >60.00)
Glucose, Bld: 94 mg/dL (ref 70–99)
Potassium: 3.9 mEq/L (ref 3.5–5.1)
SODIUM: 139 meq/L (ref 135–145)
TOTAL PROTEIN: 7.7 g/dL (ref 6.0–8.3)
Total Bilirubin: 0.8 mg/dL (ref 0.2–1.2)

## 2014-01-06 LAB — LIPID PANEL
CHOLESTEROL: 172 mg/dL (ref 0–200)
HDL: 36.7 mg/dL — ABNORMAL LOW (ref >39.00)
LDL Cholesterol: 111 mg/dL — ABNORMAL HIGH (ref 0–99)
NONHDL: 135.3
Total CHOL/HDL Ratio: 5
Triglycerides: 122 mg/dL (ref 0.0–149.0)
VLDL: 24.4 mg/dL (ref 0.0–40.0)

## 2014-01-06 LAB — TSH: TSH: 0.85 u[IU]/mL (ref 0.35–4.50)

## 2014-01-06 MED ORDER — ONDANSETRON HCL 8 MG PO TABS: 8.0000 mg | ORAL_TABLET | ORAL | Status: DC | PRN

## 2014-01-06 MED ORDER — CITALOPRAM HYDROBROMIDE 10 MG PO TABS: 10.0000 mg | ORAL_TABLET | Freq: Two times a day (BID) | ORAL | Status: DC | PRN

## 2014-01-06 MED ORDER — ZOLPIDEM TARTRATE 10 MG PO TABS: 10.0000 mg | ORAL_TABLET | Freq: Every evening | ORAL | Status: DC | PRN

## 2014-01-06 NOTE — Telephone Encounter (Signed)
Pt notified, see mychart. 

## 2014-01-06 NOTE — Telephone Encounter (Signed)
Devin Roberts pt should be set up with another sleep doctor.  SN does not see sleep pts.   thanks

## 2014-01-06 NOTE — Telephone Encounter (Signed)
Add on sheet for PSA has been faxed down to lab and will contact patient once resulted. [facility] [place]. Letter stating the flu vaccine was administered is ready for pick up, we can also mail or fax if needed.

## 2014-01-06 NOTE — Assessment & Plan Note (Signed)
I am concerned that he may have OSA so I have asked him to see sleep medicine for further evaluation

## 2014-01-06 NOTE — Telephone Encounter (Signed)
One OR the other not both

## 2014-01-06 NOTE — Telephone Encounter (Signed)
Rx for ambien 10mg  called in to pharmacy, other meds sent electronic.

## 2014-01-06 NOTE — Telephone Encounter (Signed)
Pt wife called in regards to pt physical yesterday.  Pt requesting prescriptions be sent to pharmacy and that they need documentation of flu shot this year. Does pt need to have had a stool sample test like in previous years and was pt PSA level tested. Please call [phone]

## 2014-01-06 NOTE — Telephone Encounter (Signed)
Pt request refill for Ambien 10 mg and Ambien CR 12.5 mg. Please advise. Thanks

## 2014-01-06 NOTE — Assessment & Plan Note (Signed)
He has lowered his A1C with lifestyle modifications

## 2014-01-06 NOTE — Assessment & Plan Note (Signed)
His BP is not well controlled Will upgrade his BP regimen to General MotorsEdarbyclor Will check his lytes and renal function today

## 2014-01-07 NOTE — Telephone Encounter (Signed)
LMOMTCB x1 for pt 

## 2014-01-10 ENCOUNTER — Other Ambulatory Visit: Payer: Self-pay | Admitting: Internal Medicine

## 2014-01-10 ENCOUNTER — Other Ambulatory Visit (INDEPENDENT_AMBULATORY_CARE_PROVIDER_SITE_OTHER): Payer: BC Managed Care – PPO

## 2014-01-10 ENCOUNTER — Encounter: Payer: Self-pay | Admitting: Internal Medicine

## 2014-01-10 DIAGNOSIS — Z0000 Encounter for general adult medical examination without abnormal findings: Secondary | ICD-10-CM

## 2014-01-10 LAB — PSA: PSA: 0.92 ng/mL (ref 0.10–4.00)

## 2014-01-11 NOTE — Telephone Encounter (Signed)
Spoke with patient-- aware of rec's per Dr Yetta BarreJones. Pt states that at this time he does not want to make an appt. Will call back if anything changes.  Nothing further needed.

## 2014-02-09 ENCOUNTER — Telehealth: Payer: Self-pay | Admitting: *Deleted

## 2014-02-09 NOTE — Telephone Encounter (Signed)
Patient was given samples for Edarbyclor 40-25 and  has 40-12.5 in his med list. Patient's pharmacy faxed over request wanting a PA for 40-25 unless you want the patient to stay on 40-12.5. Please advise which dose you want patient to take?

## 2014-02-09 NOTE — Telephone Encounter (Signed)
Wife left msg on triage inquiring on PA for the Edarbyclor if md want him to continue will need PA and she is wanting samples...Raechel Chute/lmb

## 2014-02-09 NOTE — Telephone Encounter (Signed)
[place] [facility] [place] for him to get this there Also, does he have a co-pay card?  Leitha Schuller. Arnez Stoneking

## 2014-02-09 NOTE — Telephone Encounter (Signed)
Pt spouse came in office requesting samples of medication EDARBYCLOR. This request has already been reviewed but pt spouse in concerned about the dosage request being lowered. Please call her to explain why this change is suddenly taking place.

## 2014-02-09 NOTE — Telephone Encounter (Signed)
Take the dose we have listed in EPIC

## 2014-02-10 NOTE — Telephone Encounter (Signed)
PA started for 40-12.5.

## 2014-02-21 LAB — HM DIABETES EYE EXAM

## 2014-04-19 ENCOUNTER — Other Ambulatory Visit: Payer: Self-pay | Admitting: Pulmonary Disease

## 2014-04-20 ENCOUNTER — Other Ambulatory Visit: Payer: Self-pay | Admitting: Pulmonary Disease

## 2014-04-22 ENCOUNTER — Telehealth: Payer: Self-pay | Admitting: Internal Medicine

## 2014-04-22 MED ORDER — PRAVASTATIN SODIUM 40 MG PO TABS: 40.0000 mg | ORAL_TABLET | Freq: Every day | ORAL | Status: DC

## 2014-04-22 NOTE — Telephone Encounter (Signed)
Is requesting ref[facility] on [facility]ach[facility].  States pharmacy has faxed a request.

## 2014-06-30 ENCOUNTER — Ambulatory Visit (INDEPENDENT_AMBULATORY_CARE_PROVIDER_SITE_OTHER): Payer: BLUE CROSS/BLUE SHIELD | Admitting: Podiatry

## 2014-06-30 ENCOUNTER — Ambulatory Visit (INDEPENDENT_AMBULATORY_CARE_PROVIDER_SITE_OTHER): Payer: BLUE CROSS/BLUE SHIELD

## 2014-06-30 DIAGNOSIS — M79672 Pain in left foot: Secondary | ICD-10-CM

## 2014-06-30 DIAGNOSIS — M7662 Achilles tendinitis, left leg: Secondary | ICD-10-CM | POA: Diagnosis not present

## 2014-06-30 MED ORDER — TRIAMCINOLONE ACETONIDE 10 MG/ML IJ SUSP
10.0000 mg | Freq: Once | INTRAMUSCULAR | Status: AC
  Administered 2014-06-30: 10 mg

## 2014-06-30 MED ORDER — MELOXICAM 15 MG PO TABS: 15.0000 mg | ORAL_TABLET | Freq: Every day | ORAL | Status: DC

## 2014-06-30 NOTE — Patient Instructions (Signed)

## 2014-06-30 NOTE — Progress Notes (Signed)
   Subjective:    Patient ID: Devin Roberts, male    DOB: 06/23/1960, 54 y.o.   MRN: [id number]  HPI Pt presents with left foot pain at the achilles area, worsens after resting and prolonged walking, tried ace wrap,   Review of Systems  All other systems reviewed and are negative.      Objective:   Physical Exam        Assessment & Plan:

## 2014-07-03 NOTE — Progress Notes (Signed)
Subjective:     Patient ID: Devin Roberts, male   DOB: 01/31/1961, 54 y.o.   MRN: [id number]  HPI patient presents with pain in the medial side of the Achilles tendon left. States that it's been getting worse recently and he has trouble at times with wearing tight shoe gear or ambulating and he wants to be able to exercise but cannot   Review of Systems  All other systems reviewed and are negative.      Objective:   Physical Exam  Constitutional: He is oriented to person, place, and time.  Cardiovascular: Intact distal pulses.   Musculoskeletal: Normal range of motion.  Neurological: He is oriented to person, place, and time.  Skin: Skin is warm.  Nursing note and vitals reviewed.  neurovascular status was found to be intact with muscle strength adequate and range of motion subtalar midtarsal joint within normal limits. Patient has normal Achilles tendon strength with mild inflammation and pain around the left medial side of the Achilles with a central and lateral band being pain-free. It is quite sore when palpated at the insertion with no discomfort at the musculotendinous junction. Patient's digits are well-perfused and patient is well oriented 3     Assessment:     Inflammation of the Achilles tendon insertion left medial side with quite a bit of pain upon palpation    Plan:     Reviewed condition and x-ray. I discussed ice which she has tried are ready and stretch which she is tried versus injection and he is opted for injection. I did explain the risk of injection and specifically the risk of rupture associated with it and he is willing to proceed with procedure. I went ahead did a sterile prep and injected with 3 mg dexamethasone Kenalog 5 mg Xylocaine medial side keeping it away from the central and lateral side and advised on reduced activity for several days and ice therapy. I gave him instructions on stretches which he can begin in 1 week and he'll reappoint if any pathology  should occur or pain

## 2014-07-13 ENCOUNTER — Other Ambulatory Visit: Payer: Self-pay | Admitting: Pulmonary Disease

## 2014-07-13 NOTE — Telephone Encounter (Signed)
Received refill request from [facility] for refill on pt ambien 10mg  tablets. Per SN rx needs to be filled with new PCP, Dr. Sanda Lingerhomas Jones. Denial faxed back to [facility] at [phone]

## 2014-10-23 IMAGING — CR DG CHEST 2V
2 series · 2 of 2 positions shown · non-contrast
Comparison: 03/10/2012.

CLINICAL DATA: Hypertension.

EXAM:
CHEST  2 VIEW

[view not recorded (1 of 2)]
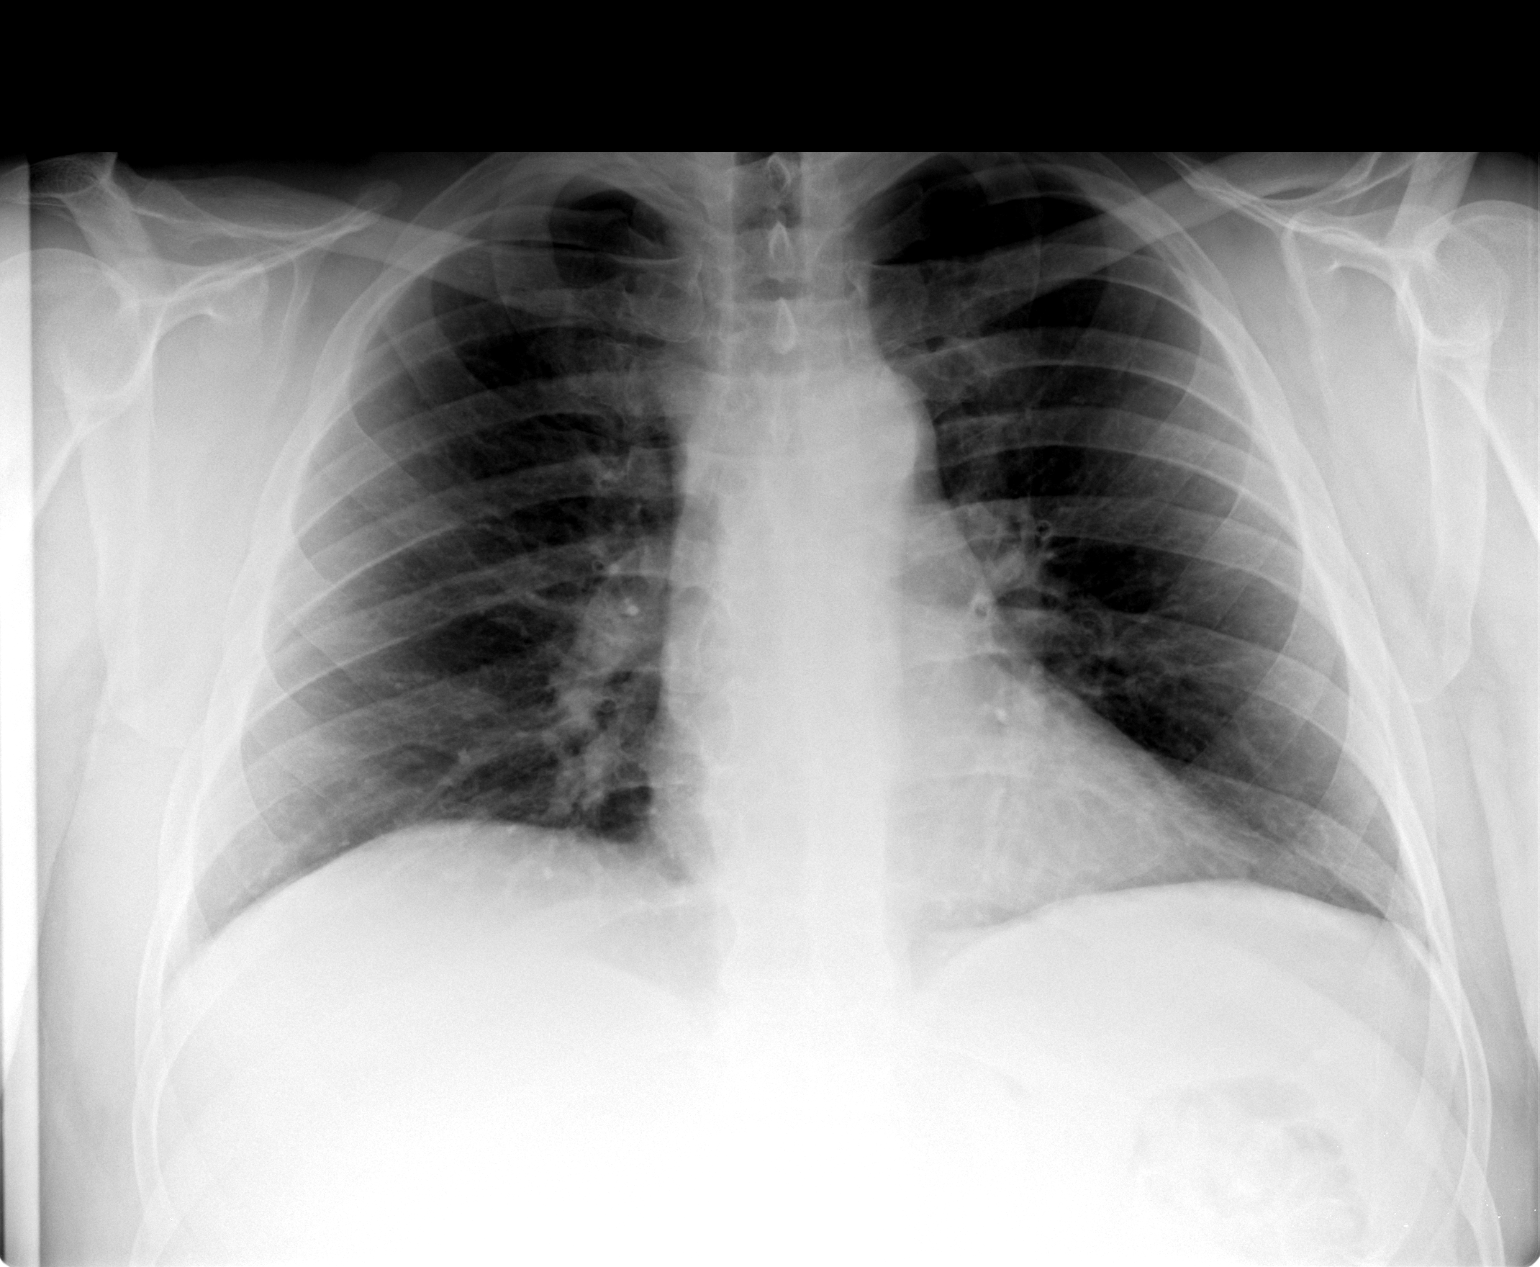

[view not recorded (2 of 2)]
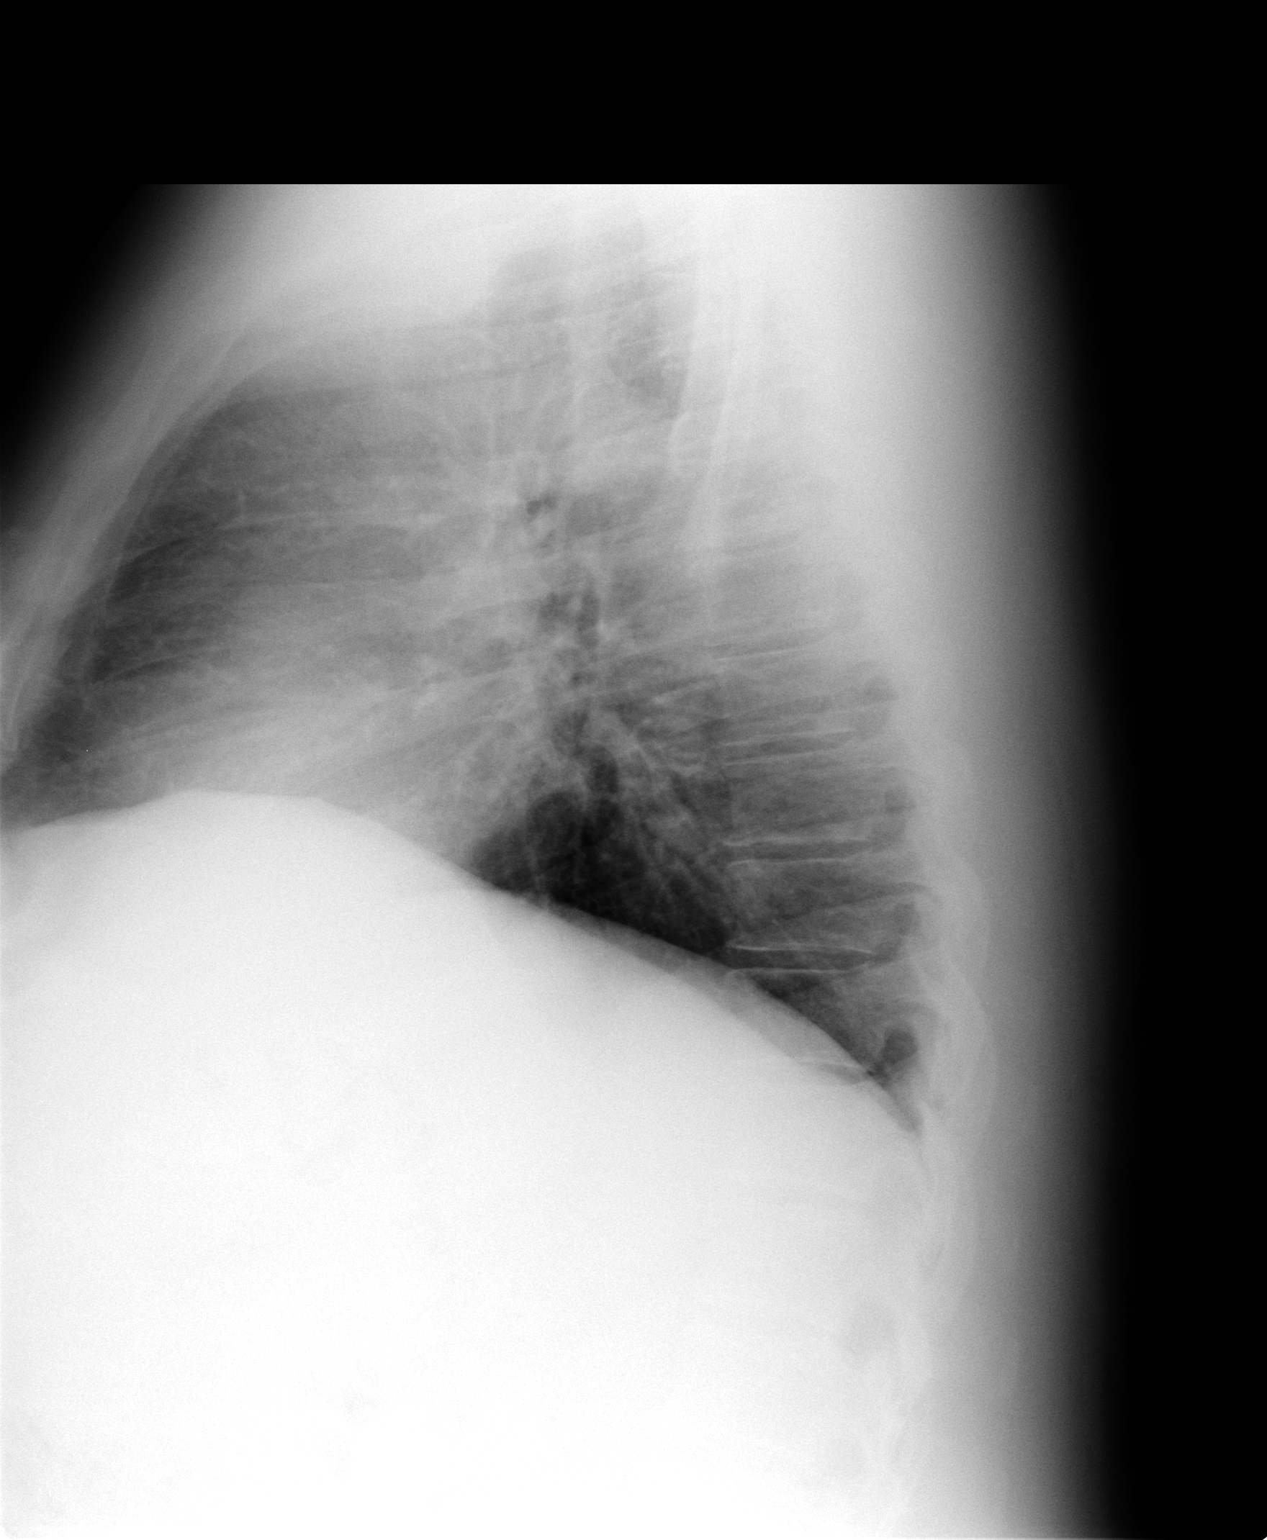

[2 of 2 positions shown; findings below may reference images not displayed]

FINDINGS: Poor inspiration. No acute cardiopulmonary disease noted. Heart size
normal. No pleural effusion or pneumothorax. Degenerative changes
thoracic spine.
IMPRESSION: No active cardiopulmonary disease.

## 2014-12-08 ENCOUNTER — Encounter: Payer: Self-pay | Admitting: Internal Medicine

## 2014-12-08 ENCOUNTER — Other Ambulatory Visit (INDEPENDENT_AMBULATORY_CARE_PROVIDER_SITE_OTHER): Payer: BLUE CROSS/BLUE SHIELD

## 2014-12-08 ENCOUNTER — Ambulatory Visit (INDEPENDENT_AMBULATORY_CARE_PROVIDER_SITE_OTHER): Payer: BLUE CROSS/BLUE SHIELD | Admitting: Internal Medicine

## 2014-12-08 VITALS — BP 130/88 | HR 73 | Temp 98.2°F | Resp 16 | Ht 68.0 in | Wt 251.0 lb

## 2014-12-08 DIAGNOSIS — E119 Type 2 diabetes mellitus without complications: Secondary | ICD-10-CM

## 2014-12-08 DIAGNOSIS — I10 Essential (primary) hypertension: Secondary | ICD-10-CM

## 2014-12-08 DIAGNOSIS — E785 Hyperlipidemia, unspecified: Secondary | ICD-10-CM | POA: Diagnosis not present

## 2014-12-08 DIAGNOSIS — Z23 Encounter for immunization: Secondary | ICD-10-CM | POA: Diagnosis not present

## 2014-12-08 DIAGNOSIS — G4700 Insomnia, unspecified: Secondary | ICD-10-CM

## 2014-12-08 LAB — CBC WITH DIFFERENTIAL/PLATELET
BASOS PCT: 0.6 % (ref 0.0–3.0)
Basophils Absolute: 0 10*3/uL (ref 0.0–0.1)
EOS ABS: 0.1 10*3/uL (ref 0.0–0.7)
Eosinophils Relative: 1.8 % (ref 0.0–5.0)
HCT: 48.2 % (ref 39.0–52.0)
HEMOGLOBIN: 16.2 g/dL (ref 13.0–17.0)
LYMPHS ABS: 2 10*3/uL (ref 0.7–4.0)
Lymphocytes Relative: 24.8 % (ref 12.0–46.0)
MCHC: 33.6 g/dL (ref 30.0–36.0)
MCV: 95.3 fl (ref 78.0–100.0)
MONO ABS: 0.8 10*3/uL (ref 0.1–1.0)
Monocytes Relative: 10.3 % (ref 3.0–12.0)
NEUTROS ABS: 5 10*3/uL (ref 1.4–7.7)
NEUTROS PCT: 62.5 % (ref 43.0–77.0)
PLATELETS: 216 10*3/uL (ref 150.0–400.0)
RBC: 5.05 Mil/uL (ref 4.22–5.81)
RDW: 12.6 % (ref 11.5–15.5)
WBC: 8 10*3/uL (ref 4.0–10.5)

## 2014-12-08 LAB — COMPREHENSIVE METABOLIC PANEL
ALBUMIN: 4.2 g/dL (ref 3.5–5.2)
ALT: 28 U/L (ref 0–53)
AST: 23 U/L (ref 0–37)
Alkaline Phosphatase: 38 U/L — ABNORMAL LOW (ref 39–117)
BUN: 19 mg/dL (ref 6–23)
CHLORIDE: 102 meq/L (ref 96–112)
CO2: 30 mEq/L (ref 19–32)
CREATININE: 1.09 mg/dL (ref 0.40–1.50)
Calcium: 9.7 mg/dL (ref 8.4–10.5)
GFR: 75.28 mL/min (ref >60.00)
GLUCOSE: 142 mg/dL — AB (ref 70–99)
Potassium: 3.9 mEq/L (ref 3.5–5.1)
SODIUM: 141 meq/L (ref 135–145)
Total Bilirubin: 0.5 mg/dL (ref 0.2–1.2)
Total Protein: 7.2 g/dL (ref 6.0–8.3)

## 2014-12-08 LAB — LIPID PANEL
CHOLESTEROL: 168 mg/dL (ref 0–200)
HDL: 42.5 mg/dL (ref >39.00)
LDL CALC: 94 mg/dL (ref 0–99)
NONHDL: 125.77
Total CHOL/HDL Ratio: 4
Triglycerides: 161 mg/dL — ABNORMAL HIGH (ref 0.0–149.0)
VLDL: 32.2 mg/dL (ref 0.0–40.0)

## 2014-12-08 LAB — URINALYSIS, ROUTINE W REFLEX MICROSCOPIC
BILIRUBIN URINE: NEGATIVE
Hgb urine dipstick: NEGATIVE
KETONES UR: NEGATIVE
Leukocytes, UA: NEGATIVE
NITRITE: NEGATIVE
RBC / HPF: NONE SEEN (ref 0–?)
Specific Gravity, Urine: 1.025 (ref 1.000–1.030)
Total Protein, Urine: NEGATIVE
UROBILINOGEN UA: 0.2 (ref 0.0–1.0)
Urine Glucose: NEGATIVE
WBC UA: NONE SEEN (ref 0–?)
pH: 6 (ref 5.0–8.0)

## 2014-12-08 LAB — MICROALBUMIN / CREATININE URINE RATIO
CREATININE, U: 195.7 mg/dL
MICROALB UR: 1.1 mg/dL (ref 0.0–1.9)
Microalb Creat Ratio: 0.6 mg/g (ref 0.0–30.0)

## 2014-12-08 LAB — TSH: TSH: 1.89 u[IU]/mL (ref 0.35–4.50)

## 2014-12-08 LAB — HEMOGLOBIN A1C: Hgb A1c MFr Bld: 7.1 % — ABNORMAL HIGH (ref 4.6–6.5)

## 2014-12-08 MED ORDER — ZOLPIDEM TARTRATE 10 MG PO TABS: 10.0000 mg | ORAL_TABLET | Freq: Every evening | ORAL | Status: DC | PRN

## 2014-12-08 NOTE — Progress Notes (Signed)
Pre visit review using our clinic review tool, if applicable. No additional management support is needed unless otherwise documented below in the visit note. 

## 2014-12-08 NOTE — Patient Instructions (Signed)

## 2014-12-08 NOTE — Progress Notes (Signed)
Subjective:  Patient ID: Devin Roberts, male    DOB: Jun 21, 1960  Age: 54 y.o. MRN: [id number]  CC: Hypertension; Hyperlipidemia; and Diabetes   HPI Michaeljoseph L Esbenshade presents for routine follow-up. He has gained some weight and complains of insomnia. He is working second shift now.  Outpatient Prescriptions Prior to Visit  Medication Sig Dispense Refill  . Azilsartan-Chlorthalidone (EDARBYCLOR) 40-12.5 MG TABS Take 1 tablet by mouth daily. 30 tablet 11  . fexofenadine (ALLEGRA) 180 MG tablet Take 180 mg by mouth daily as needed.      . meloxicam (MOBIC) 15 MG tablet Take 1 tablet (15 mg total) by mouth daily. 30 tablet 2  . pravastatin (PRAVACHOL) 40 MG tablet Take 1 tablet (40 mg total) by mouth daily. 90 tablet 3  . amLODipine (NORVASC) 5 MG tablet TAKE 1 TABLET EACH DAY. 90 tablet 3  . citalopram (CELEXA) 10 MG tablet Take 1 tablet (10 mg total) by mouth 2 (two) times daily as needed. 60 tablet 5  . ibuprofen (ADVIL,MOTRIN) 600 MG tablet Take 1 tablet (600 mg total) by mouth every 6 (six) hours as needed. 30 tablet 11  . ondansetron (ZOFRAN) 8 MG tablet Take 1 tablet (8 mg total) by mouth as needed for nausea or vomiting. 20 tablet 3  . zolpidem (AMBIEN) 10 MG tablet Take 1 tablet (10 mg total) by mouth at bedtime as needed for sleep. 30 tablet 5   No facility-administered medications prior to visit.    ROS Review of Systems  Constitutional: Positive for unexpected weight change (wt gain). Negative for fever, chills, diaphoresis, appetite change and fatigue.  HENT: Negative.   Eyes: Negative.   Respiratory: Negative.  Negative for cough, choking, chest tightness, shortness of breath and stridor.   Cardiovascular: Negative.  Negative for chest pain, palpitations and leg swelling.  Gastrointestinal: Negative.  Negative for abdominal pain, diarrhea, constipation and blood in stool.  Endocrine: Negative.   Genitourinary: Negative.   Musculoskeletal: Negative.  Negative for  myalgias, back pain, joint swelling and arthralgias.  Skin: Negative.   Allergic/Immunologic: Negative.   Neurological: Negative.   Hematological: Negative.  Negative for adenopathy. Does not bruise/bleed easily.  Psychiatric/Behavioral: Positive for sleep disturbance. Negative for suicidal ideas, dysphoric mood, decreased concentration and agitation. The patient is not nervous/anxious.     Objective:  BP 130/88 mmHg  Pulse 73  Temp(Src) 98.2 F (36.8 C) (Oral)  Resp 16  Ht 5\' 8"  (1.727 m)  Wt 251 lb (113.853 kg)  BMI 38.17 kg/m2  SpO2 92%  BP Readings from Last 3 Encounters:  12/08/14 130/88  01/05/14 160/120  03/29/13 177/108    Wt Readings from Last 3 Encounters:  12/08/14 251 lb (113.853 kg)  01/05/14 243 lb (110.224 kg)  03/24/13 262 lb 9.6 oz (119.115 kg)    Physical Exam  Constitutional: He is oriented to person, place, and time. No distress.  HENT:  Mouth/Throat: Oropharynx is clear and moist. No oropharyngeal exudate.  Eyes: Conjunctivae are normal. Right eye exhibits no discharge. Left eye exhibits no discharge. No scleral icterus.  Neck: Normal range of motion. Neck supple. No JVD present. No tracheal deviation present. No thyromegaly present.  Cardiovascular: Normal rate, regular rhythm, normal heart sounds and intact distal pulses.  Exam reveals no gallop and no friction rub.   No murmur heard. Pulmonary/Chest: Effort normal and breath sounds normal. No stridor. No respiratory distress. He has no wheezes. He has no rales. He exhibits no tenderness.  Abdominal: Soft.  Bowel sounds are normal. He exhibits no distension and no mass. There is no tenderness. There is no rebound and no guarding.  Musculoskeletal: Normal range of motion. He exhibits no edema or tenderness.  Lymphadenopathy:    He has no cervical adenopathy.  Neurological: He is oriented to person, place, and time.  Skin: Skin is warm and dry. No rash noted. He is not diaphoretic. No erythema. No  pallor.    Lab Results  Component Value Date   WBC 8.0 12/08/2014   HGB 16.2 12/08/2014   HCT 48.2 12/08/2014   PLT 216.0 12/08/2014   GLUCOSE 142* 12/08/2014   CHOL 168 12/08/2014   TRIG 161.0* 12/08/2014   HDL 42.50 12/08/2014   LDLDIRECT 97.5 05/19/2008   LDLCALC 94 12/08/2014   ALT 28 12/08/2014   AST 23 12/08/2014   NA 141 12/08/2014   K 3.9 12/08/2014   CL 102 12/08/2014   CREATININE 1.09 12/08/2014   BUN 19 12/08/2014   CO2 30 12/08/2014   TSH 1.89 12/08/2014   PSA 0.92 01/10/2014   HGBA1C 7.1* 12/08/2014   MICROALBUR 1.1 12/08/2014    No results found.  Assessment & Plan:   Terique was seen today for hypertension, hyperlipidemia and diabetes.  Diagnoses and all orders for this visit:  Diabetes mellitus type 2, diet-controlled- his blood sugars have gone up some but are still adequately well controlled, I don't think he needs to be on a medication at this time, he will work to improve his lifestyle modifications. -     Lipid panel; Future -     Hemoglobin A1c; Future -     Microalbumin / creatinine urine ratio; Future  Essential hypertension- his blood pressure is well-controlled, electrolytes and renal function are stable. -     Comprehensive metabolic panel; Future -     CBC with Differential/Platelet; Future -     Urinalysis, Routine w reflex microscopic (not at [facility]); Future  Hyperlipidemia with target LDL less than 100- he has achieved his LDL goal is doing well on the statin. -     Lipid panel; Future -     TSH; Future  Insomnia -     zolpidem (AMBIEN) 10 MG tablet; Take 1 tablet (10 mg total) by mouth at bedtime as needed for sleep.  Need for influenza vaccination -     Flu Vaccine QUAD 36+ mos IM   I have discontinued Mr. Faucett ibuprofen, amLODipine, citalopram, and ondansetron. I am also having him maintain his fexofenadine, Azilsartan-Chlorthalidone, pravastatin, meloxicam, and zolpidem.  Meds ordered this encounter  Medications  .  zolpidem (AMBIEN) 10 MG tablet    Sig: Take 1 tablet (10 mg total) by mouth at bedtime as needed for sleep.    Dispense:  30 tablet    Refill:  5     Follow-up: Return in about 4 months (around 04/09/2015).  Sanda Linger, MD

## 2015-02-03 ENCOUNTER — Other Ambulatory Visit: Payer: Self-pay | Admitting: Internal Medicine

## 2015-02-22 ENCOUNTER — Encounter: Payer: Self-pay | Admitting: Podiatry

## 2015-02-22 ENCOUNTER — Ambulatory Visit (INDEPENDENT_AMBULATORY_CARE_PROVIDER_SITE_OTHER): Payer: BLUE CROSS/BLUE SHIELD | Admitting: Podiatry

## 2015-02-22 DIAGNOSIS — M7662 Achilles tendinitis, left leg: Secondary | ICD-10-CM | POA: Diagnosis not present

## 2015-02-22 MED ORDER — TRIAMCINOLONE ACETONIDE 10 MG/ML IJ SUSP
10.0000 mg | Freq: Once | INTRAMUSCULAR | Status: AC
  Administered 2015-02-22: 10 mg

## 2015-02-22 NOTE — Progress Notes (Signed)
Subjective:     Patient ID: Devin Roberts, male   DOB: 11/12/1960, 54 y.o.   MRN: [id number]  HPI patient states that they've Achilles tendon has come back again that it was doing great for around 7 months and he started wearing hiking boots and it started to hurt   Review of Systems     Objective:   Physical Exam Neurovascular status intact muscle strength adequate with discomfort on the medial side of the left Achilles tendon with a central and lateral bands doing well    Assessment:     Acute Achilles tendinitis left    Plan:     Discussed treatment options and he wants injection. I explained the risk of this and he is willing to accept this and I did a careful injection of the medial side 3 mg dexamethasone Kenalog and 5 mg Xylocaine and advised on reduced activity

## 2015-03-29 LAB — HEPATIC FUNCTION PANEL
ALK PHOS: 67 U/L (ref 25–125)
ALT: 30 U/L (ref 10–40)
AST: 21 U/L (ref 14–40)
Bilirubin, Total: 0.8 mg/dL

## 2015-03-29 LAB — BASIC METABOLIC PANEL
BUN: 26 mg/dL — AB (ref 4–21)
CREATININE: 1.4 mg/dL — AB (ref 0.6–1.3)
Glucose: 562 mg/dL
POTASSIUM: 4 mmol/L (ref 3.4–5.3)
Sodium: 129 mmol/L — AB (ref 137–147)

## 2015-03-29 LAB — TSH: TSH: 2.46 u[IU]/mL (ref 0.41–5.90)

## 2015-03-29 LAB — HEMOGLOBIN A1C: Hemoglobin A1C: 13.9

## 2015-04-03 ENCOUNTER — Ambulatory Visit (INDEPENDENT_AMBULATORY_CARE_PROVIDER_SITE_OTHER): Payer: BLUE CROSS/BLUE SHIELD | Admitting: Internal Medicine

## 2015-04-03 ENCOUNTER — Encounter: Payer: Self-pay | Admitting: Internal Medicine

## 2015-04-03 VITALS — BP 122/80 | HR 82 | Temp 98.2°F | Resp 16 | Ht 68.0 in | Wt 248.0 lb

## 2015-04-03 DIAGNOSIS — Z794 Long term (current) use of insulin: Secondary | ICD-10-CM

## 2015-04-03 DIAGNOSIS — IMO0002 Reserved for concepts with insufficient information to code with codable children: Secondary | ICD-10-CM

## 2015-04-03 DIAGNOSIS — E1165 Type 2 diabetes mellitus with hyperglycemia: Secondary | ICD-10-CM

## 2015-04-03 DIAGNOSIS — E118 Type 2 diabetes mellitus with unspecified complications: Secondary | ICD-10-CM | POA: Diagnosis not present

## 2015-04-03 LAB — GLUCOSE, POCT (MANUAL RESULT ENTRY): POC Glucose: 372 mg/dl — AB (ref 70–99)

## 2015-04-03 MED ORDER — SITAGLIPTIN PHOSPHATE 100 MG PO TABS: 100.0000 mg | ORAL_TABLET | Freq: Every day | ORAL | Status: DC

## 2015-04-03 MED ORDER — ONETOUCH VERIO IQ SYSTEM W/DEVICE KIT: 1.0000 | PACK | Freq: Three times a day (TID) | Status: DC

## 2015-04-03 MED ORDER — GLUCOSE BLOOD VI STRP: ORAL_STRIP | Status: DC

## 2015-04-03 MED ORDER — METFORMIN HCL 1000 MG PO TABS: 1000.0000 mg | ORAL_TABLET | Freq: Two times a day (BID) | ORAL | Status: DC

## 2015-04-03 MED ORDER — INSULIN GLARGINE 300 UNIT/ML ~~LOC~~ SOPN: 40.0000 [IU] | PEN_INJECTOR | Freq: Every day | SUBCUTANEOUS | Status: DC

## 2015-04-03 NOTE — Progress Notes (Signed)
Subjective:  Patient ID: Devin Roberts, male    DOB: 1960/10/05  Age: 55 y.o. MRN: [id number]  CC: Diabetes   HPI DUAYNE BRIDEAU presents for follow-up on diabetes. Over the last few weeks he had developed a myriad of symptoms and about a week ago was seen at an urgent care center. He was found to have a blood sugar of over 500 and an A1c of 13.9%. He has since been placed on metformin and is starting to feel better.  Outpatient Prescriptions Prior to Visit  Medication Sig Dispense Refill  . EDARBYCLOR 40-12.5 MG TABS TAKE 1 TABLET ONCE DAILY. 30 tablet 11  . fexofenadine (ALLEGRA) 180 MG tablet Take 180 mg by mouth daily as needed.      . meloxicam (MOBIC) 15 MG tablet Take 1 tablet (15 mg total) by mouth daily. 30 tablet 2  . pravastatin (PRAVACHOL) 40 MG tablet Take 1 tablet (40 mg total) by mouth daily. 90 tablet 3  . zolpidem (AMBIEN) 10 MG tablet Take 1 tablet (10 mg total) by mouth at bedtime as needed for sleep. 30 tablet 5   No facility-administered medications prior to visit.    ROS Review of Systems  Constitutional: Negative.  Negative for fever, chills, diaphoresis, appetite change and fatigue.  HENT: Negative.  Negative for trouble swallowing and voice change.   Eyes: Positive for visual disturbance.  Respiratory: Negative.  Negative for cough, choking, chest tightness, shortness of breath and stridor.   Cardiovascular: Negative.  Negative for chest pain, palpitations and leg swelling.  Gastrointestinal: Negative.  Negative for nausea, vomiting, abdominal pain, diarrhea, constipation and blood in stool.  Endocrine: Positive for polydipsia, polyphagia and polyuria.  Genitourinary: Positive for frequency. Negative for dysuria, urgency, decreased urine volume and difficulty urinating.  Musculoskeletal: Negative.  Negative for myalgias, back pain, joint swelling and arthralgias.  Skin: Negative.  Negative for color change and rash.  Allergic/Immunologic: Negative.     Neurological: Negative.  Negative for dizziness, tremors, weakness, light-headedness, numbness and headaches.  Hematological: Negative.  Negative for adenopathy. Does not bruise/bleed easily.  Psychiatric/Behavioral: Negative.     Objective:  BP 122/80 mmHg  Pulse 82  Temp(Src) 98.2 F (36.8 C) (Oral)  Resp 16  Ht _0  (1.727 m)  Wt 248 lb (112.492 kg)  BMI 37.72 kg/m2  SpO2 96%  BP Readings from Last 3 Encounters:  04/03/15 122/80  12/08/14 130/88  01/05/14 160/120    Wt Readings from Last 3 Encounters:  04/03/15 248 lb (112.492 kg)  12/08/14 251 lb (113.853 kg)  01/05/14 243 lb (110.224 kg)    Physical Exam  Constitutional: He is oriented to person, place, and time. He appears well-developed and well-nourished. No distress.  HENT:  Mouth/Throat: Oropharynx is clear and moist. No oropharyngeal exudate.  Eyes: Conjunctivae are normal. Right eye exhibits no discharge. Left eye exhibits no discharge. No scleral icterus.  Neck: Normal range of motion. Neck supple. No JVD present. No tracheal deviation present. No thyromegaly present.  Cardiovascular: Normal rate, regular rhythm and intact distal pulses.  Exam reveals no gallop and no friction rub.   No murmur heard. Pulmonary/Chest: Effort normal and breath sounds normal. No stridor. No respiratory distress. He has no wheezes. He has no rales. He exhibits no tenderness.  Abdominal: Soft. Bowel sounds are normal. He exhibits no distension and no mass. There is no tenderness. There is no rebound and no guarding.  Musculoskeletal: Normal range of motion. He exhibits no edema or  tenderness.  Lymphadenopathy:    He has no cervical adenopathy.  Neurological: He is oriented to person, place, and time.  Skin: Skin is warm and dry. No rash noted. He is not diaphoretic. No erythema. No pallor.  Psychiatric: He has a normal mood and affect. His behavior is normal. Judgment and thought content normal.  Vitals reviewed.   Lab  Results  Component Value Date   WBC 8.0 12/08/2014   HGB 16.2 12/08/2014   HCT 48.2 12/08/2014   PLT 216.0 12/08/2014   GLUCOSE 142* 12/08/2014   CHOL 168 12/08/2014   TRIG 161.0* 12/08/2014   HDL 42.50 12/08/2014   LDLDIRECT 97.5 05/19/2008   LDLCALC 94 12/08/2014   ALT 30 03/29/2015   AST 21 03/29/2015   NA 129* 03/29/2015   K 4.0 03/29/2015   CL 102 12/08/2014   CREATININE 1.4* 03/29/2015   BUN 26* 03/29/2015   CO2 30 12/08/2014   TSH 2.46 03/29/2015   PSA 0.92 01/10/2014   HGBA1C 13.9 03/29/2015   MICROALBUR 1.1 12/08/2014    No results found.  Assessment & Plan:   Eilam was seen today for diabetes.  Diagnoses and all orders for this visit:  Uncontrolled type 2 diabetes mellitus with complication, with long-term current use of insulin ([place])- he has only been taking the metformin 1000 mg once a day so I asked him to increase that to twice a day. He has hyperglycemic symptoms so I have asked to start once a day basal insulin- I gave him a sample of the insulin in the office today and showed him how to use it. He will start Januvia 100 mg once a day as well. He was also given One Touch Verio blood glucose monitors. I've asked him to be seen for diabetic education as soon as possible. -     glucose blood (ONETOUCH VERIO) test strip; Use TID -     Blood Glucose Monitoring Suppl (ONETOUCH VERIO IQ SYSTEM) w/Device KIT; 1 Act by Does not apply route 3 (three) times daily. -     Insulin Glargine (TOUJEO SOLOSTAR) 300 UNIT/ML SOPN; Inject 40 Units into the skin daily. -     metFORMIN (GLUCOPHAGE) 1000 MG tablet; Take 1 tablet (1,000 mg total) by mouth 2 (two) times daily with a meal. -     sitaGLIPtin (JANUVIA) 100 MG tablet; Take 1 tablet (100 mg total) by mouth daily.   I have changed Mr. Caravello metFORMIN. I am also having him start on glucose blood, ONETOUCH VERIO IQ SYSTEM, Insulin Glargine, and sitaGLIPtin. Additionally, I am having him maintain his fexofenadine,  pravastatin, meloxicam, zolpidem, and EDARBYCLOR.  Meds ordered this encounter  Medications  . DISCONTD: metFORMIN (GLUCOPHAGE) 1000 MG tablet    Sig: TAKE 1 TABLET BY MOUTH AT BREAKFAST AND 1 TABLET BY MOUTH WITH EVENING MEAL    Refill:  0  . glucose blood (ONETOUCH VERIO) test strip    Sig: Use TID    Dispense:  100 each    Refill:  11  . Blood Glucose Monitoring Suppl (ONETOUCH VERIO IQ SYSTEM) w/Device KIT    Sig: 1 Act by Does not apply route 3 (three) times daily.    Dispense:  2 kit    Refill:  0  . Insulin Glargine (TOUJEO SOLOSTAR) 300 UNIT/ML SOPN    Sig: Inject 40 Units into the skin daily.    Dispense:  1.5 mL    Refill:  11  . metFORMIN (GLUCOPHAGE) 1000 MG tablet  Sig: Take 1 tablet (1,000 mg total) by mouth 2 (two) times daily with a meal.    Dispense:  180 tablet    Refill:  1  . sitaGLIPtin (JANUVIA) 100 MG tablet    Sig: Take 1 tablet (100 mg total) by mouth daily.    Dispense:  90 tablet    Refill:  1     Follow-up: Return in about 6 weeks (around 05/15/2015).  [name], MD

## 2015-04-03 NOTE — Patient Instructions (Signed)

## 2015-04-03 NOTE — Progress Notes (Signed)
Pre visit review using our clinic review tool, if applicable. No additional management support is needed unless otherwise documented below in the visit note. 

## 2015-04-05 ENCOUNTER — Telehealth: Payer: Self-pay | Admitting: Internal Medicine

## 2015-04-05 ENCOUNTER — Other Ambulatory Visit: Payer: Self-pay | Admitting: Internal Medicine

## 2015-04-05 DIAGNOSIS — IMO0002 Reserved for concepts with insufficient information to code with codable children: Secondary | ICD-10-CM

## 2015-04-05 DIAGNOSIS — E1165 Type 2 diabetes mellitus with hyperglycemia: Secondary | ICD-10-CM

## 2015-04-05 DIAGNOSIS — Z794 Long term (current) use of insulin: Principal | ICD-10-CM

## 2015-04-05 DIAGNOSIS — E118 Type 2 diabetes mellitus with unspecified complications: Principal | ICD-10-CM

## 2015-04-05 NOTE — Telephone Encounter (Signed)
Patient states that he was supposed to get a referral to a dietician based on last visit. i am unable to locate a referral or any notes in the visit summary that talk about this. Please advise.

## 2015-04-05 NOTE — Telephone Encounter (Signed)
ordered

## 2015-04-06 ENCOUNTER — Encounter: Payer: Self-pay | Admitting: Internal Medicine

## 2015-04-17 LAB — HM DIABETES EYE EXAM

## 2015-04-26 ENCOUNTER — Encounter: Payer: Self-pay | Admitting: *Deleted

## 2015-04-26 ENCOUNTER — Encounter: Payer: BLUE CROSS/BLUE SHIELD | Attending: Internal Medicine | Admitting: *Deleted

## 2015-04-26 VITALS — Ht 68.5 in | Wt 243.1 lb

## 2015-04-26 DIAGNOSIS — E118 Type 2 diabetes mellitus with unspecified complications: Secondary | ICD-10-CM | POA: Diagnosis present

## 2015-04-26 DIAGNOSIS — Z794 Long term (current) use of insulin: Secondary | ICD-10-CM | POA: Diagnosis not present

## 2015-04-26 DIAGNOSIS — E119 Type 2 diabetes mellitus without complications: Secondary | ICD-10-CM

## 2015-05-02 NOTE — Progress Notes (Signed)
Diabetes Self-Management Education  Visit Type: First/Initial  Appt. Start Time: 1630 Appt. End Time: 1800  05/02/2015  [name], identified by name and date of birth, is a 55 y.o. male with a diagnosis of Diabetes: Type 2. [name] presents for DSME accompanied by his Devin Devin Roberts. In November Devin Roberts was laid off of his job which impacted his level of activity and self-esteem. He has been hunting and fishing a lot. He feels that the decreased level of activity has contributed to the increase of his A1c. His A1c in September 2016 was 7.0%.   He recently started Toujeo Insulin and his FBS has been brought down: 04/04/15: /dl to 05/03/54 /dl.  ASSESSMENT  Height 5' 8.5" (1.74 m), weight 243 lb 1.6 oz (110.269 kg). Body mass index is 36.42 kg/(m^2).      Diabetes Self-Management Education - 04/26/15 1637    Visit Information   Visit Type First/Initial   Initial Visit   Diabetes Type Type 2   Are you currently following a meal plan? Yes  self educated   Are you taking your medications as prescribed? Yes   Date Diagnosed 04/03/2015   Health Coping   How would you rate your overall health? Good   Psychosocial Assessment   Patient Belief/Attitude about Diabetes Motivated to manage diabetes   Self-care barriers None   Self-management support Doctor's office;Family;CDE visits   Other persons present Patient;Spouse/SO  Devin Roberts   Patient Concerns Nutrition/Meal planning;Medication;Monitoring;Glycemic Control;Weight Control;Healthy Lifestyle   Special Needs None   Preferred Learning Style No preference indicated   Learning Readiness Change in progress   How often do you need to have someone help you when you read instructions, pamphlets, or other written materials from your doctor or pharmacy? 2 - Rarely   Complications   Last HgB A1C per patient/outside source 13 %   How often do you check your blood sugar? 1-2 times/day   Fasting Blood glucose range (mg/dL) 21-308    Postprandial Blood glucose range (mg/dL) --  evening: les than /dl   Have you had a dilated eye exam in the past 12 months? Yes   Have you had a dental exam in the past 12 months? Yes   Are you checking your feet? Yes   How many days per week are you checking your feet? 7   Dietary Intake   Breakfast eggs, bacon / breakfast bar  & fruit/    Snack (morning) none   Lunch salad (spinach, vegetables, cheese), cubed venison, celery   Snack (afternoon) none   Dinner grilled fish, grilled meat, (salad), asparagus vegetables   Snack (evening) peanuts   Beverage(s) water, unsweet tea, coffee cream no sugar   Exercise   Exercise Type Light (walking / raking leaves)  Treadmill   How many days per week to you exercise? 5   How many minutes per day do you exercise? 30   Total minutes per week of exercise 150   Patient Education   Previous Diabetes Education No   Disease state  Definition of diabetes, type 1 and 2, and the diagnosis of diabetes;Factors that contribute to the development of diabetes   Nutrition management  Role of diet in the treatment of diabetes and the relationship between the three main macronutrients and blood glucose level;Effects of alcohol on blood glucose and safety factors with consumption of alcohol.;Information on hints to eating out and maintain blood glucose control.   Physical activity and exercise  Role of exercise on  diabetes management, blood pressure control and cardiac health.   Medications Reviewed patients medication for diabetes, action, purpose, timing of dose and side effects.   Monitoring Purpose and frequency of SMBG.   Acute complications Taught treatment of hypoglycemia - the 15 rule.   Chronic complications Relationship between chronic complications and blood glucose control   Psychosocial adjustment Role of stress on diabetes;Worked with patient to identify barriers to care and solutions;Helped patient identify a support system for diabetes  management   Personal strategies to promote health Lifestyle issues that need to be addressed for better diabetes care  activity   Individualized Goals (developed by patient)   Nutrition General guidelines for healthy choices and portions discussed   Physical Activity Exercise 3-5 times per week;30 minutes per day  goal of 150 minutes   Medications take my medication as prescribed   Monitoring  test my blood glucose as discussed   Reducing Risk do foot checks daily   Outcomes   Expected Outcomes Demonstrated interest in learning. Expect positive outcomes   Future DMSE PRN   Program Status Completed      Individualized Plan for Diabetes Self-Management Training:   Learning Objective:  Patient will have a greater understanding of diabetes self-management. Patient education plan is to attend individual and/or group sessions per assessed needs and concerns.   Plan:   Patient Instructions  Plan:  Utilize the "My Plate" to balance your meals Fix your plate and don't go back for seconds. If you think you are still hungry wait 15 minutes and reevaluate.  If you are still hungry consider increasing your vegetable intake. Include protein in moderation with your meals and snacks Consider reading food labels for Total Carbohydrate and Fat Grams of foods Consider  increasing your activity level by walking for 30 minutes daily as tolerated Consider checking BG at alternate times per day as directed by MD  Continue taking medication as directed by MD  Expected Outcomes:  Demonstrated interest in learning. Expect positive outcomes  Education material provided: Living Well with Diabetes, A1C conversion sheet, Meal plan card, My Plate, Snack sheet and Support group flyer  If problems or questions, patient to contact team via:  Phone  Future DSME appointment: PRN

## 2015-05-02 NOTE — Patient Instructions (Signed)
Plan:  Utilize the "My Plate" to balance your meals Fix your plate and don't go back for seconds. If you think you are still hungry wait 15 minutes and reevaluate.  If you are still hungry consider increasing your vegetable intake. Include protein in moderation with your meals and snacks Consider reading food labels for Total Carbohydrate and Fat Grams of foods Consider  increasing your activity level by walking for 30 minutes daily as tolerated Consider checking BG at alternate times per day as directed by MD  Continue taking medication as directed by MD

## 2015-05-03 ENCOUNTER — Other Ambulatory Visit: Payer: Self-pay | Admitting: Internal Medicine

## 2015-05-15 ENCOUNTER — Other Ambulatory Visit (INDEPENDENT_AMBULATORY_CARE_PROVIDER_SITE_OTHER): Payer: BLUE CROSS/BLUE SHIELD

## 2015-05-15 ENCOUNTER — Encounter: Payer: Self-pay | Admitting: Internal Medicine

## 2015-05-15 ENCOUNTER — Ambulatory Visit (INDEPENDENT_AMBULATORY_CARE_PROVIDER_SITE_OTHER): Payer: BLUE CROSS/BLUE SHIELD | Admitting: Internal Medicine

## 2015-05-15 VITALS — BP 120/86 | HR 72 | Temp 98.2°F | Resp 16 | Ht 68.5 in | Wt 240.0 lb

## 2015-05-15 DIAGNOSIS — E1165 Type 2 diabetes mellitus with hyperglycemia: Secondary | ICD-10-CM

## 2015-05-15 DIAGNOSIS — Z794 Long term (current) use of insulin: Secondary | ICD-10-CM

## 2015-05-15 DIAGNOSIS — I10 Essential (primary) hypertension: Secondary | ICD-10-CM | POA: Diagnosis not present

## 2015-05-15 DIAGNOSIS — E118 Type 2 diabetes mellitus with unspecified complications: Secondary | ICD-10-CM

## 2015-05-15 DIAGNOSIS — IMO0002 Reserved for concepts with insufficient information to code with codable children: Secondary | ICD-10-CM

## 2015-05-15 LAB — BASIC METABOLIC PANEL
BUN: 23 mg/dL (ref 6–23)
CHLORIDE: 101 meq/L (ref 96–112)
CO2: 30 meq/L (ref 19–32)
CREATININE: 1.22 mg/dL (ref 0.40–1.50)
Calcium: 10.1 mg/dL (ref 8.4–10.5)
GFR: 65.99 mL/min (ref >60.00)
Glucose, Bld: 103 mg/dL — ABNORMAL HIGH (ref 70–99)
POTASSIUM: 4.3 meq/L (ref 3.5–5.1)
Sodium: 138 mEq/L (ref 135–145)

## 2015-05-15 LAB — HEMOGLOBIN A1C: HEMOGLOBIN A1C: 9 % — AB (ref 4.6–6.5)

## 2015-05-15 MED ORDER — ASPIRIN EC 81 MG PO TBEC: 81.0000 mg | DELAYED_RELEASE_TABLET | Freq: Every day | ORAL | Status: DC

## 2015-05-15 NOTE — Patient Instructions (Signed)

## 2015-05-15 NOTE — Progress Notes (Signed)
Subjective:  Patient ID: Devin Roberts, male    DOB: Feb 13, 1961  Age: 55 y.o. MRN: [id number]  CC: Hypertension and Diabetes   HPI [name] presents for follow-up on diabetes. Since I last saw him he has been working on his lifestyle modifications with some success. His blood sugars have consistently been less than 130 with a rare number going above that. He has no documented episodes of hypoglycemia on his blood sugar log today. He feels well today and offers no signs and symptoms suspicious of hyper or hypoglycemia.  Outpatient Prescriptions Prior to Visit  Medication Sig Dispense Refill  . Blood Glucose Monitoring Suppl (ONETOUCH VERIO IQ SYSTEM) w/Device KIT 1 Act by Does not apply route 3 (three) times daily. 2 kit 0  . EDARBYCLOR 40-12.5 MG TABS TAKE 1 TABLET ONCE DAILY. 30 tablet 11  . fexofenadine (ALLEGRA) 180 MG tablet Take 180 mg by mouth daily as needed.      Marland Kitchen glucose blood (ONETOUCH VERIO) test strip Use TID 100 each 11  . Insulin Glargine (TOUJEO SOLOSTAR) 300 UNIT/ML SOPN Inject 40 Units into the skin daily. 1.5 mL 11  . meloxicam (MOBIC) 15 MG tablet Take 1 tablet (15 mg total) by mouth daily. 30 tablet 2  . metFORMIN (GLUCOPHAGE) 1000 MG tablet Take 1 tablet (1,000 mg total) by mouth 2 (two) times daily with a meal. 180 tablet 1  . pravastatin (PRAVACHOL) 40 MG tablet TAKE ONE TABLET AT BEDTIME. 90 tablet 1  . sitaGLIPtin (JANUVIA) 100 MG tablet Take 1 tablet (100 mg total) by mouth daily. 90 tablet 1  . zolpidem (AMBIEN) 10 MG tablet Take 1 tablet (10 mg total) by mouth at bedtime as needed for sleep. 30 tablet 5   No facility-administered medications prior to visit.    ROS Review of Systems  Constitutional: Negative.  Negative for fever, chills, activity change and appetite change.  HENT: Negative.   Eyes: Negative.  Negative for visual disturbance.  Respiratory: Negative.  Negative for cough, choking, chest tightness, shortness of breath and stridor.     Cardiovascular: Negative.  Negative for chest pain, palpitations and leg swelling.  Gastrointestinal: Negative.  Negative for nausea, vomiting, abdominal pain, diarrhea, constipation and blood in stool.  Endocrine: Negative.  Negative for polydipsia, polyphagia and polyuria.  Genitourinary: Negative.  Negative for difficulty urinating.  Musculoskeletal: Negative.  Negative for myalgias, back pain, joint swelling and arthralgias.  Skin: Negative.  Negative for color change and rash.  Allergic/Immunologic: Negative.   Neurological: Negative.  Negative for dizziness, tremors, weakness, light-headedness and numbness.  Hematological: Negative.  Negative for adenopathy. Does not bruise/bleed easily.  Psychiatric/Behavioral: Negative.     Objective:  BP 120/86 mmHg  Pulse 72  Temp(Src) 98.2 F (36.8 C) (Oral)  Resp 16  Ht 5' 8.5" (1.74 m)  Wt 240 lb (108.863 kg)  BMI 35.96 kg/m2  SpO2 98%  BP Readings from Last 3 Encounters:  05/15/15 120/86  04/03/15 122/80  12/08/14 130/88    Wt Readings from Last 3 Encounters:  05/15/15 240 lb (108.863 kg)  04/26/15 243 lb 1.6 oz (110.269 kg)  04/03/15 248 lb (112.492 kg)    Physical Exam  Constitutional: He is oriented to person, place, and time. He appears well-developed and well-nourished. No distress.  HENT:  Head: Normocephalic and atraumatic.  Mouth/Throat: Oropharynx is clear and moist. No oropharyngeal exudate.  Eyes: Conjunctivae are normal. Right eye exhibits no discharge. Left eye exhibits no discharge. No scleral icterus.  Neck: Normal range of motion. Neck supple. No JVD present. No tracheal deviation present. No thyromegaly present.  Cardiovascular: Normal rate, regular rhythm, normal heart sounds and intact distal pulses.  Exam reveals no gallop and no friction rub.   No murmur heard. Pulmonary/Chest: Effort normal and breath sounds normal. No stridor. No respiratory distress. He has no wheezes. He has no rales. He exhibits no  tenderness.  Abdominal: Soft. Bowel sounds are normal. He exhibits no distension and no mass. There is no tenderness. There is no rebound and no guarding.  Musculoskeletal: Normal range of motion. He exhibits no edema or tenderness.  Lymphadenopathy:    He has no cervical adenopathy.  Neurological: He is oriented to person, place, and time.  Skin: Skin is warm and dry. No rash noted. He is not diaphoretic. No erythema. No pallor.  Vitals reviewed.   Lab Results  Component Value Date   WBC 8.0 12/08/2014   HGB 16.2 12/08/2014   HCT 48.2 12/08/2014   PLT 216.0 12/08/2014   GLUCOSE 103* 05/15/2015   CHOL 168 12/08/2014   TRIG 161.0* 12/08/2014   HDL 42.50 12/08/2014   LDLDIRECT 97.5 05/19/2008   LDLCALC 94 12/08/2014   ALT 30 03/29/2015   AST 21 03/29/2015   NA 138 05/15/2015   K 4.3 05/15/2015   CL 101 05/15/2015   CREATININE 1.22 05/15/2015   BUN 23 05/15/2015   CO2 30 05/15/2015   TSH 2.46 03/29/2015   PSA 0.92 01/10/2014   HGBA1C 9.0* 05/15/2015   MICROALBUR 1.1 12/08/2014    No results found.  Assessment & Plan:   Mable was seen today for hypertension and diabetes.  Diagnoses and all orders for this visit:  Essential hypertension - his blood pressure is well-controlled, electrolytes and renal function are stable. -     Basic metabolic panel; Future  Uncontrolled type 2 diabetes mellitus with complication, with long-term current use of insulin ([place])- his A1c is down to 9%, for now I will continue the current regimen and he will continue to work on his lifestyle modifications. -     aspirin EC 81 MG tablet; Take 1 tablet (81 mg total) by mouth daily. -     Basic metabolic panel; Future -     Hemoglobin A1c; Future   I am having [name] start on aspirin EC. I am also having him maintain his fexofenadine, meloxicam, zolpidem, EDARBYCLOR, glucose blood, ONETOUCH VERIO IQ SYSTEM, Insulin Glargine, metFORMIN, sitaGLIPtin, and pravastatin.  Meds ordered this  encounter  Medications  . aspirin EC 81 MG tablet    Sig: Take 1 tablet (81 mg total) by mouth daily.    Dispense:  90 tablet    Refill:  3     Follow-up: Return in about 6 months (around 11/12/2015).  [name], MD

## 2015-05-15 NOTE — Progress Notes (Signed)
Pre visit review using our clinic review tool, if applicable. No additional management support is needed unless otherwise documented below in the visit note. 

## 2015-05-30 ENCOUNTER — Encounter: Payer: Self-pay | Admitting: Internal Medicine

## 2015-05-31 ENCOUNTER — Other Ambulatory Visit: Payer: Self-pay | Admitting: Internal Medicine

## 2015-05-31 DIAGNOSIS — IMO0002 Reserved for concepts with insufficient information to code with codable children: Secondary | ICD-10-CM

## 2015-05-31 DIAGNOSIS — E118 Type 2 diabetes mellitus with unspecified complications: Principal | ICD-10-CM

## 2015-05-31 DIAGNOSIS — E1165 Type 2 diabetes mellitus with hyperglycemia: Secondary | ICD-10-CM

## 2015-05-31 DIAGNOSIS — Z794 Long term (current) use of insulin: Principal | ICD-10-CM

## 2015-05-31 MED ORDER — INSULIN GLARGINE 300 UNIT/ML ~~LOC~~ SOPN: 30.0000 [IU] | PEN_INJECTOR | Freq: Every day | SUBCUTANEOUS | Status: DC

## 2015-06-13 ENCOUNTER — Other Ambulatory Visit: Payer: Self-pay | Admitting: Internal Medicine

## 2015-06-13 NOTE — Telephone Encounter (Signed)
Rx faxed back to [place]...Raechel Chute/lmb

## 2015-07-14 DIAGNOSIS — H5203 Hypermetropia, bilateral: Secondary | ICD-10-CM | POA: Diagnosis not present

## 2015-07-14 DIAGNOSIS — E119 Type 2 diabetes mellitus without complications: Secondary | ICD-10-CM | POA: Diagnosis not present

## 2015-08-28 ENCOUNTER — Ambulatory Visit (INDEPENDENT_AMBULATORY_CARE_PROVIDER_SITE_OTHER): Payer: BLUE CROSS/BLUE SHIELD | Admitting: Podiatry

## 2015-08-28 ENCOUNTER — Encounter: Payer: Self-pay | Admitting: Podiatry

## 2015-08-28 VITALS — BP 134/88 | HR 76 | Resp 16

## 2015-08-28 DIAGNOSIS — M7662 Achilles tendinitis, left leg: Secondary | ICD-10-CM

## 2015-08-28 NOTE — Progress Notes (Signed)
Subjective:     Patient ID: Devin Roberts, male   DOB: 01/13/1961, 55 y.o.   MRN: [id number]  HPI patient presents stating that he needs new insoles and his heel has been hurting but not as bad   Review of Systems     Objective:   Physical Exam Neurovascular status intact muscle strength adequate with discomfort posterior left heel that's improved by about 70%    Assessment:     Improving left heel posterior that is still present with patient requiring Orthotics    Plan:     Discussed orthotics to lift up the arch and patient is scanned for new orthotic with quarter inch heel lifts bilateral to help with the chronic Achilles tendinitis. Continue exercises

## 2015-09-06 ENCOUNTER — Other Ambulatory Visit: Payer: Self-pay | Admitting: Internal Medicine

## 2015-09-22 DIAGNOSIS — D1801 Hemangioma of skin and subcutaneous tissue: Secondary | ICD-10-CM | POA: Diagnosis not present

## 2015-09-22 DIAGNOSIS — D225 Melanocytic nevi of trunk: Secondary | ICD-10-CM | POA: Diagnosis not present

## 2015-09-22 DIAGNOSIS — I788 Other diseases of capillaries: Secondary | ICD-10-CM | POA: Diagnosis not present

## 2015-09-22 DIAGNOSIS — L821 Other seborrheic keratosis: Secondary | ICD-10-CM | POA: Diagnosis not present

## 2015-10-13 ENCOUNTER — Other Ambulatory Visit: Payer: Self-pay | Admitting: Internal Medicine

## 2015-10-13 NOTE — Telephone Encounter (Signed)
Faxed script back to [place].../lmb 

## 2015-10-23 ENCOUNTER — Encounter: Payer: Self-pay | Admitting: Internal Medicine

## 2015-10-23 ENCOUNTER — Other Ambulatory Visit (INDEPENDENT_AMBULATORY_CARE_PROVIDER_SITE_OTHER): Payer: BLUE CROSS/BLUE SHIELD

## 2015-10-23 ENCOUNTER — Ambulatory Visit (INDEPENDENT_AMBULATORY_CARE_PROVIDER_SITE_OTHER): Payer: BLUE CROSS/BLUE SHIELD | Admitting: Internal Medicine

## 2015-10-23 VITALS — BP 138/82 | HR 74 | Temp 98.8°F | Resp 20 | Wt 230.0 lb

## 2015-10-23 DIAGNOSIS — I10 Essential (primary) hypertension: Secondary | ICD-10-CM | POA: Diagnosis not present

## 2015-10-23 DIAGNOSIS — E118 Type 2 diabetes mellitus with unspecified complications: Secondary | ICD-10-CM | POA: Diagnosis not present

## 2015-10-23 DIAGNOSIS — E1165 Type 2 diabetes mellitus with hyperglycemia: Secondary | ICD-10-CM | POA: Diagnosis not present

## 2015-10-23 DIAGNOSIS — Z794 Long term (current) use of insulin: Secondary | ICD-10-CM

## 2015-10-23 DIAGNOSIS — IMO0002 Reserved for concepts with insufficient information to code with codable children: Secondary | ICD-10-CM

## 2015-10-23 DIAGNOSIS — E785 Hyperlipidemia, unspecified: Secondary | ICD-10-CM

## 2015-10-23 LAB — BASIC METABOLIC PANEL
BUN: 17 mg/dL (ref 6–23)
CALCIUM: 10.2 mg/dL (ref 8.4–10.5)
CO2: 32 mEq/L (ref 19–32)
Chloride: 103 mEq/L (ref 96–112)
Creatinine, Ser: 1.17 mg/dL (ref 0.40–1.50)
GFR: 69.14 mL/min (ref >60.00)
Glucose, Bld: 70 mg/dL (ref 70–99)
Potassium: 4 mEq/L (ref 3.5–5.1)
SODIUM: 142 meq/L (ref 135–145)

## 2015-10-23 LAB — HEMOGLOBIN A1C: HEMOGLOBIN A1C: 5.6 % (ref 4.6–6.5)

## 2015-10-23 NOTE — Progress Notes (Signed)
Pre visit review using our clinic review tool, if applicable. No additional management support is needed unless otherwise documented below in the visit note. 

## 2015-10-23 NOTE — Progress Notes (Signed)
Subjective:  Patient ID: Devin Roberts, male    DOB: Dec 18, 1960  Age: 55 y.o. MRN: [id number]  CC: Hypertension and Diabetes   HPI [name] presents for follow-up on hypertension and diabetes. Since I last saw him he has made a major improvement in his lifestyle modification and has lost weight. He tells me his blood sugars have been well controlled and he has had no signs or symptoms of high or low blood sugars. He also tells me his blood pressure is well-controlled and his had no episodes of headache, blurred vision, chest pain, shortness of breath, palpitations, edema, or fatigue.  Outpatient Medications Prior to Visit  Medication Sig Dispense Refill  . aspirin EC 81 MG tablet Take 1 tablet (81 mg total) by mouth daily. 90 tablet 3  . Blood Glucose Monitoring Suppl (ONETOUCH VERIO IQ SYSTEM) w/Device KIT 1 Act by Does not apply route 3 (three) times daily. 2 kit 0  . EDARBYCLOR 40-12.5 MG TABS TAKE 1 TABLET ONCE DAILY. 30 tablet 11  . fexofenadine (ALLEGRA) 180 MG tablet Take 180 mg by mouth daily as needed.      Marland Kitchen glucose blood (ONETOUCH VERIO) test strip Use TID 100 each 11  . meloxicam (MOBIC) 15 MG tablet Take 1 tablet (15 mg total) by mouth daily. 30 tablet 2  . metFORMIN (GLUCOPHAGE) 1000 MG tablet Take 1 tablet (1,000 mg total) by mouth 2 (two) times daily with a meal. 180 tablet 1  . pravastatin (PRAVACHOL) 40 MG tablet TAKE ONE TABLET AT BEDTIME. 90 tablet 1  . sitaGLIPtin (JANUVIA) 100 MG tablet Take 1 tablet (100 mg total) by mouth daily. 90 tablet 1  . zolpidem (AMBIEN) 10 MG tablet TAKE 1 TABLET AT BEDTIME AS NEEDED FOR SLEEP. 30 tablet 3  . TOUJEO SOLOSTAR 300 UNIT/ML SOPN INJECT 40 UNITS INTO SKIN DAILY. 4.5 mL 3   No facility-administered medications prior to visit.     ROS Review of Systems  Constitutional: Negative.  Negative for activity change, appetite change, diaphoresis and fatigue.  HENT: Negative.  Negative for trouble swallowing.   Eyes: Negative.   Negative for visual disturbance.  Respiratory: Negative.  Negative for cough, choking, chest tightness, shortness of breath and stridor.   Cardiovascular: Negative.  Negative for chest pain, palpitations and leg swelling.  Gastrointestinal: Negative.  Negative for abdominal pain, diarrhea, nausea and vomiting.  Endocrine: Negative.  Negative for polydipsia, polyphagia and polyuria.  Genitourinary: Negative.   Musculoskeletal: Negative.  Negative for back pain and neck pain.  Skin: Negative.  Negative for color change and rash.  Allergic/Immunologic: Negative.   Neurological: Negative.  Negative for dizziness, weakness, numbness and headaches.  Hematological: Negative.  Negative for adenopathy. Does not bruise/bleed easily.  Psychiatric/Behavioral: Negative.     Objective:  BP 138/82   Pulse 74   Temp 98.8 F (37.1 C) (Oral)   Resp 20   Wt 230 lb (104.3 kg)   SpO2 96%   BMI 34.46 kg/m   BP Readings from Last 3 Encounters:  10/23/15 138/82  08/28/15 134/88  05/15/15 120/86    Wt Readings from Last 3 Encounters:  10/23/15 230 lb (104.3 kg)  05/15/15 240 lb (108.9 kg)  04/26/15 243 lb 1.6 oz (110.3 kg)    Physical Exam  Constitutional: He is oriented to person, place, and time. No distress.  HENT:  Right Ear: External ear normal.  Mouth/Throat: Oropharynx is clear and moist. No oropharyngeal exudate.  Eyes: Conjunctivae are normal.  Right eye exhibits no discharge. Left eye exhibits no discharge. No scleral icterus.  Neck: Normal range of motion. No JVD present. No tracheal deviation present. No thyromegaly present.  Cardiovascular: Normal rate, regular rhythm, normal heart sounds and intact distal pulses.  Exam reveals no gallop and no friction rub.   No murmur heard. Pulmonary/Chest: Effort normal and breath sounds normal. No stridor. No respiratory distress. He has no wheezes. He has no rales. He exhibits no tenderness.  Abdominal: Soft. Bowel sounds are normal. He  exhibits no distension and no mass. There is no tenderness. There is no rebound and no guarding.  Musculoskeletal: Normal range of motion. He exhibits no edema, tenderness or deformity.  Lymphadenopathy:    He has no cervical adenopathy.  Neurological: He is oriented to person, place, and time.  Skin: Skin is warm and dry. No rash noted. He is not diaphoretic. No erythema. No pallor.  Vitals reviewed.   Lab Results  Component Value Date   WBC 8.0 12/08/2014   HGB 16.2 12/08/2014   HCT 48.2 12/08/2014   PLT 216.0 12/08/2014   GLUCOSE 70 10/23/2015   CHOL 168 12/08/2014   TRIG 161.0 (H) 12/08/2014   HDL 42.50 12/08/2014   LDLDIRECT 97.5 05/19/2008   LDLCALC 94 12/08/2014   ALT 30 03/29/2015   AST 21 03/29/2015   NA 142 10/23/2015   K 4.0 10/23/2015   CL 103 10/23/2015   CREATININE 1.17 10/23/2015   BUN 17 10/23/2015   CO2 32 10/23/2015   TSH 2.46 03/29/2015   PSA 0.92 01/10/2014   HGBA1C 5.6 10/23/2015   MICROALBUR 1.1 12/08/2014    No results found.  Assessment & Plan:   Trevaris was seen today for hypertension and diabetes.  Diagnoses and all orders for this visit:  Uncontrolled type 2 diabetes mellitus with complication, with long-term current use of insulin ([place])- His A1c is down to 5.6% so I have asked him to stop using the insulin, for now I've asked him to continue taking Januvia and metformin. -     Basic metabolic panel; Future -     Hemoglobin A1c; Future  Hyperlipidemia with target LDL less than 100  Essential hypertension- his blood pressure is well-controlled, electrolytes and renal function are stable. -     Basic metabolic panel; Future   I have discontinued Mr. Jaydn Moscato [id number]. I am also having him maintain his fexofenadine, meloxicam, EDARBYCLOR, glucose blood, ONETOUCH VERIO IQ SYSTEM, metFORMIN, sitaGLIPtin, pravastatin, aspirin EC, and zolpidem.  No orders of the defined types were placed in this encounter.    Follow-up: Return in  about 4 months (around 02/22/2016).  [name], MD

## 2015-10-23 NOTE — Patient Instructions (Signed)

## 2015-11-15 ENCOUNTER — Other Ambulatory Visit: Payer: Self-pay | Admitting: Internal Medicine

## 2015-11-15 DIAGNOSIS — E1165 Type 2 diabetes mellitus with hyperglycemia: Secondary | ICD-10-CM

## 2015-11-15 DIAGNOSIS — E118 Type 2 diabetes mellitus with unspecified complications: Principal | ICD-10-CM

## 2015-11-15 DIAGNOSIS — IMO0002 Reserved for concepts with insufficient information to code with codable children: Secondary | ICD-10-CM

## 2015-11-15 DIAGNOSIS — Z794 Long term (current) use of insulin: Principal | ICD-10-CM

## 2015-12-12 ENCOUNTER — Other Ambulatory Visit: Payer: Self-pay | Admitting: Internal Medicine

## 2015-12-12 DIAGNOSIS — Z794 Long term (current) use of insulin: Principal | ICD-10-CM

## 2015-12-12 DIAGNOSIS — IMO0002 Reserved for concepts with insufficient information to code with codable children: Secondary | ICD-10-CM

## 2015-12-12 DIAGNOSIS — E118 Type 2 diabetes mellitus with unspecified complications: Principal | ICD-10-CM

## 2015-12-12 DIAGNOSIS — E1165 Type 2 diabetes mellitus with hyperglycemia: Secondary | ICD-10-CM

## 2016-02-12 ENCOUNTER — Other Ambulatory Visit: Payer: Self-pay | Admitting: Internal Medicine

## 2016-02-12 NOTE — Telephone Encounter (Signed)
rx faxed to [facility]  

## 2016-02-16 ENCOUNTER — Other Ambulatory Visit: Payer: Self-pay | Admitting: Internal Medicine

## 2016-03-07 ENCOUNTER — Ambulatory Visit (INDEPENDENT_AMBULATORY_CARE_PROVIDER_SITE_OTHER): Payer: BLUE CROSS/BLUE SHIELD | Admitting: Internal Medicine

## 2016-03-07 ENCOUNTER — Other Ambulatory Visit (INDEPENDENT_AMBULATORY_CARE_PROVIDER_SITE_OTHER): Payer: BLUE CROSS/BLUE SHIELD

## 2016-03-07 ENCOUNTER — Encounter: Payer: Self-pay | Admitting: Internal Medicine

## 2016-03-07 VITALS — BP 124/84 | HR 72 | Temp 97.9°F | Ht 68.5 in | Wt 229.2 lb

## 2016-03-07 DIAGNOSIS — E785 Hyperlipidemia, unspecified: Secondary | ICD-10-CM

## 2016-03-07 DIAGNOSIS — I10 Essential (primary) hypertension: Secondary | ICD-10-CM

## 2016-03-07 DIAGNOSIS — Z0000 Encounter for general adult medical examination without abnormal findings: Secondary | ICD-10-CM

## 2016-03-07 DIAGNOSIS — E1165 Type 2 diabetes mellitus with hyperglycemia: Secondary | ICD-10-CM

## 2016-03-07 DIAGNOSIS — IMO0002 Reserved for concepts with insufficient information to code with codable children: Secondary | ICD-10-CM

## 2016-03-07 DIAGNOSIS — E118 Type 2 diabetes mellitus with unspecified complications: Secondary | ICD-10-CM

## 2016-03-07 LAB — URINALYSIS, ROUTINE W REFLEX MICROSCOPIC
BILIRUBIN URINE: NEGATIVE
HGB URINE DIPSTICK: NEGATIVE
KETONES UR: NEGATIVE
Leukocytes, UA: NEGATIVE
NITRITE: NEGATIVE
PH: 6.5 (ref 5.0–8.0)
RBC / HPF: NONE SEEN (ref 0–?)
Specific Gravity, Urine: 1.015 (ref 1.000–1.030)
Total Protein, Urine: NEGATIVE
UROBILINOGEN UA: 0.2 (ref 0.0–1.0)
Urine Glucose: NEGATIVE
WBC UA: NONE SEEN (ref 0–?)

## 2016-03-07 LAB — HEMOGLOBIN A1C: HEMOGLOBIN A1C: 5.9 % (ref 4.6–6.5)

## 2016-03-07 LAB — BASIC METABOLIC PANEL
BUN: 19 mg/dL (ref 6–23)
CO2: 34 mEq/L — ABNORMAL HIGH (ref 19–32)
CREATININE: 1.12 mg/dL (ref 0.40–1.50)
Calcium: 9.8 mg/dL (ref 8.4–10.5)
Chloride: 103 mEq/L (ref 96–112)
GFR: 72.61 mL/min (ref >60.00)
Glucose, Bld: 114 mg/dL — ABNORMAL HIGH (ref 70–99)
POTASSIUM: 4.3 meq/L (ref 3.5–5.1)
Sodium: 141 mEq/L (ref 135–145)

## 2016-03-07 LAB — PSA: PSA: 1.17 ng/mL (ref 0.10–4.00)

## 2016-03-07 LAB — LIPID PANEL
Cholesterol: 160 mg/dL (ref 0–200)
HDL: 46.6 mg/dL (ref >39.00)
LDL CALC: 103 mg/dL — AB (ref 0–99)
NonHDL: 113.47
TRIGLYCERIDES: 53 mg/dL (ref 0.0–149.0)
Total CHOL/HDL Ratio: 3
VLDL: 10.6 mg/dL (ref 0.0–40.0)

## 2016-03-07 NOTE — Progress Notes (Signed)
Pre visit review using our clinic review tool, if applicable. No additional management support is needed unless otherwise documented below in the visit note. 

## 2016-03-07 NOTE — Progress Notes (Signed)
Subjective:  Patient ID: Devin Roberts, male    DOB: [birth year]  Age: 55 y.o. MRN: [id number]  CC: Hypertension; Diabetes; Hyperlipidemia; and Annual Exam   HPI [name] presents for a CPX.  He tells me his blood pressure and blood sugars have been well controlled. He denies any recent episodes of blurred vision, chest pain, shortness of breath, dyspnea on exertion, palpitations, edema, or fatigue.  He also tells me his blood sugars have been well controlled and he has had no polys.  Outpatient Medications Prior to Visit  Medication Sig Dispense Refill  . aspirin EC 81 MG tablet Take 1 tablet (81 mg total) by mouth daily. 90 tablet 3  . Azilsartan-Chlorthalidone (EDARBYCLOR) 40-12.5 MG TABS Take 1 tablet by mouth daily. 90 tablet 1  . Blood Glucose Monitoring Suppl (ONETOUCH VERIO IQ SYSTEM) w/Device KIT 1 Act by Does not apply route 3 (three) times daily. 2 kit 0  . fexofenadine (ALLEGRA) 180 MG tablet Take 180 mg by mouth daily as needed.      Marland Kitchen glucose blood (ONETOUCH VERIO) test strip Use TID 100 each 11  . pravastatin (PRAVACHOL) 40 MG tablet TAKE ONE TABLET AT BEDTIME. 90 tablet 3  . zolpidem (AMBIEN) 10 MG tablet TAKE 1 TABLET AT BEDTIME AS NEEDED FOR SLEEP. 30 tablet 3  . metFORMIN (GLUCOPHAGE) 1000 MG tablet TAKE 1 TABLET TWICE DAILY WITH FOOD. 180 tablet 1  . JANUVIA 100 MG tablet TAKE 1 TABLET ONCE DAILY. 90 tablet 3  . meloxicam (MOBIC) 15 MG tablet Take 1 tablet (15 mg total) by mouth daily. 30 tablet 2   No facility-administered medications prior to visit.     ROS Review of Systems  Constitutional: Negative for appetite change, diaphoresis, fatigue and unexpected weight change.  HENT: Negative.   Eyes: Negative for visual disturbance.  Respiratory: Negative.  Negative for cough, chest tightness, shortness of breath and wheezing.   Cardiovascular: Negative.  Negative for chest pain, palpitations and leg swelling.  Gastrointestinal: Negative.  Negative for  abdominal pain, constipation, diarrhea, nausea and vomiting.  Endocrine: Negative.   Genitourinary: Negative.  Negative for difficulty urinating, discharge, dysuria, penile swelling, scrotal swelling, testicular pain and urgency.  Musculoskeletal: Negative.  Negative for back pain, myalgias and neck pain.  Skin: Negative.   Allergic/Immunologic: Negative.   Neurological: Negative.  Negative for dizziness, weakness, numbness and headaches.  Hematological: Negative.  Negative for adenopathy. Does not bruise/bleed easily.  Psychiatric/Behavioral: Negative.     Objective:  BP 124/84 (BP Location: Left Arm, Patient Position: Sitting, Cuff Size: Large)   Pulse 72   Temp 97.9 F (36.6 C) (Oral)   Ht 5' 8.5" (1.74 m)   Wt 229 lb 4 oz (104 kg)   SpO2 95%   BMI 34.35 kg/m   BP Readings from Last 3 Encounters:  03/07/16 124/84  10/23/15 138/82  08/28/15 134/88    Wt Readings from Last 3 Encounters:  03/07/16 229 lb 4 oz (104 kg)  10/23/15 230 lb (104.3 kg)  05/15/15 240 lb (108.9 kg)    Physical Exam  Constitutional: He is oriented to person, place, and time. He appears well-developed and well-nourished. No distress.  HENT:  Mouth/Throat: Oropharynx is clear and moist. No oropharyngeal exudate.  Eyes: Conjunctivae are normal. Right eye exhibits no discharge. Left eye exhibits no discharge. No scleral icterus.  Neck: Normal range of motion. Neck supple. No JVD present. No tracheal deviation present. No thyromegaly present.  Cardiovascular: Normal rate, regular  rhythm, normal heart sounds and intact distal pulses.  Exam reveals no gallop and no friction rub.   No murmur heard. EKG ---  Sinus  Rhythm  -Anterolateral ST-elevation -repolarization variant.   PROBABLY NORMAL- no change from prior EKG  Pulmonary/Chest: Effort normal and breath sounds normal. No stridor. No respiratory distress. He has no wheezes. He has no rales. He exhibits no tenderness.  Abdominal: Soft. Bowel sounds  are normal. He exhibits no distension and no mass. There is no tenderness. There is no rebound and no guarding. Hernia confirmed negative in the right inguinal area and confirmed negative in the left inguinal area.  Genitourinary: Rectum normal, prostate normal, testes normal and penis normal. Rectal exam shows no external hemorrhoid, no internal hemorrhoid, no fissure, no mass, no tenderness, anal tone normal and guaiac negative stool. Prostate is not enlarged and not tender. Right testis shows no mass, no swelling and no tenderness. Right testis is descended. Left testis shows no mass, no swelling and no tenderness. Left testis is descended. Circumcised. No penile erythema or penile tenderness. No discharge found.  Musculoskeletal: Normal range of motion. He exhibits no edema or tenderness.  Lymphadenopathy:    He has no cervical adenopathy.       Right: No inguinal adenopathy present.       Left: No inguinal adenopathy present.  Neurological: He is oriented to person, place, and time.  Skin: Skin is warm and dry. No rash noted. He is not diaphoretic. No erythema. No pallor.  Psychiatric: He has a normal mood and affect. His behavior is normal. Judgment and thought content normal.  Vitals reviewed.   Lab Results  Component Value Date   WBC 7.8 03/07/2016   HGB 15.9 03/07/2016   HCT 45.9 03/07/2016   PLT 197.0 03/07/2016   GLUCOSE 114 (H) 03/07/2016   CHOL 160 03/07/2016   TRIG 53.0 03/07/2016   HDL 46.60 03/07/2016   LDLDIRECT 97.5 05/19/2008   LDLCALC 103 (H) 03/07/2016   ALT 30 03/29/2015   AST 21 03/29/2015   NA 141 03/07/2016   K 4.3 03/07/2016   CL 103 03/07/2016   CREATININE 1.12 03/07/2016   BUN 19 03/07/2016   CO2 34 (H) 03/07/2016   TSH 2.46 03/29/2015   PSA 1.17 03/07/2016   HGBA1C 5.9 03/07/2016   MICROALBUR 1.1 12/08/2014    No results found.  Assessment & Plan:   Estell was seen today for hypertension, diabetes, hyperlipidemia and annual exam.  Diagnoses  and all orders for this visit:  Essential hypertension- His blood pressure is well-controlled, his electrolytes remain normal, he has normal renal function, we'll continue to control his blood pressure with the ARB thiazide combination. -     Basic metabolic panel; Future -     CBC with Differential/Platelet; Future -     Urinalysis, Routine w reflex microscopic; Future -     EKG 12-Lead  Uncontrolled type 2 diabetes mellitus with complication, without long-term current use of insulin ([place])- his A1c is down to 5.9%, I've advised him that he should discontinue use of metformin. -     Basic metabolic panel; Future -     Hemoglobin A1c; Future  Hyperlipidemia with target LDL less than 100- he has achieved his LDL goal is doing well on the statin. -     Lipid panel; Future  Annual physical exam- exam completed, labs ordered and reviewed, vaccines reviewed and updated, his colonoscopy is up-to-date, patient education material was given. -  PSA; Future   I have discontinued Mr. Sondgeroth meloxicam, JANUVIA, and metFORMIN. I am also having him maintain his fexofenadine, glucose blood, ONETOUCH VERIO IQ SYSTEM, aspirin EC, pravastatin, zolpidem, and Azilsartan-Chlorthalidone.  No orders of the defined types were placed in this encounter.    Follow-up: Return in about 6 months (around 09/05/2016).  [name], MD

## 2016-03-07 NOTE — Patient Instructions (Signed)

## 2016-03-08 ENCOUNTER — Encounter: Payer: Self-pay | Admitting: Internal Medicine

## 2016-03-08 LAB — CBC WITH DIFFERENTIAL/PLATELET
BASOS PCT: 1 % (ref 0.0–3.0)
Basophils Absolute: 0.1 10*3/uL (ref 0.0–0.1)
EOS ABS: 0.2 10*3/uL (ref 0.0–0.7)
EOS PCT: 2.8 % (ref 0.0–5.0)
HEMATOCRIT: 45.9 % (ref 39.0–52.0)
HEMOGLOBIN: 15.9 g/dL (ref 13.0–17.0)
LYMPHS PCT: 21.1 % (ref 12.0–46.0)
Lymphs Abs: 1.6 10*3/uL (ref 0.7–4.0)
MCHC: 34.6 g/dL (ref 30.0–36.0)
MCV: 93.2 fl (ref 78.0–100.0)
Monocytes Absolute: 0.6 10*3/uL (ref 0.1–1.0)
Monocytes Relative: 8.2 % (ref 3.0–12.0)
Neutro Abs: 5.2 10*3/uL (ref 1.4–7.7)
Neutrophils Relative %: 66.9 % (ref 43.0–77.0)
Platelets: 197 10*3/uL (ref 150.0–400.0)
RBC: 4.93 Mil/uL (ref 4.22–5.81)
RDW: 13 % (ref 11.5–15.5)
WBC: 7.8 10*3/uL (ref 4.0–10.5)

## 2016-03-22 DIAGNOSIS — J0100 Acute maxillary sinusitis, unspecified: Secondary | ICD-10-CM | POA: Diagnosis not present

## 2016-04-01 ENCOUNTER — Encounter: Payer: Self-pay | Admitting: Internal Medicine

## 2016-04-02 ENCOUNTER — Other Ambulatory Visit: Payer: Self-pay | Admitting: Internal Medicine

## 2016-04-02 DIAGNOSIS — R0989 Other specified symptoms and signs involving the circulatory and respiratory systems: Secondary | ICD-10-CM | POA: Insufficient documentation

## 2016-04-05 DIAGNOSIS — J209 Acute bronchitis, unspecified: Secondary | ICD-10-CM | POA: Diagnosis not present

## 2016-04-16 ENCOUNTER — Ambulatory Visit (INDEPENDENT_AMBULATORY_CARE_PROVIDER_SITE_OTHER)
Admission: RE | Admit: 2016-04-16 | Discharge: 2016-04-16 | Disposition: A | Discharge status: Home / Self Care | Payer: BLUE CROSS/BLUE SHIELD | Source: Ambulatory Visit | Attending: Internal Medicine | Admitting: Internal Medicine

## 2016-04-16 ENCOUNTER — Ambulatory Visit (INDEPENDENT_AMBULATORY_CARE_PROVIDER_SITE_OTHER): Payer: BLUE CROSS/BLUE SHIELD | Admitting: Internal Medicine

## 2016-04-16 VITALS — BP 142/92 | HR 67 | Temp 98.5°F | Resp 16 | Ht 68.5 in | Wt 234.0 lb

## 2016-04-16 DIAGNOSIS — R059 Cough, unspecified: Secondary | ICD-10-CM

## 2016-04-16 DIAGNOSIS — R05 Cough: Secondary | ICD-10-CM

## 2016-04-16 DIAGNOSIS — B9789 Other viral agents as the cause of diseases classified elsewhere: Secondary | ICD-10-CM | POA: Diagnosis not present

## 2016-04-16 DIAGNOSIS — I10 Essential (primary) hypertension: Secondary | ICD-10-CM

## 2016-04-16 DIAGNOSIS — J069 Acute upper respiratory infection, unspecified: Secondary | ICD-10-CM | POA: Diagnosis not present

## 2016-04-16 MED ORDER — HYDROCODONE-HOMATROPINE 5-1.5 MG/5ML PO SYRP: 5.0000 mL | ORAL_SOLUTION | Freq: Three times a day (TID) | ORAL | 0 refills | Status: DC | PRN

## 2016-04-16 NOTE — Progress Notes (Signed)
Subjective:  Patient ID: Devin Roberts, male    DOB: April 09, 1960  Age: 56 y.o. MRN: [id number]  CC: Cough   HPI Devin Roberts presents for A 3 week history of cough. He has been seen elsewhere at urgent care centers and has been treated with amoxicillin, Kenalog Injection, Zithromax, Tussionex suspension, albuterol inhaler, and Claritin. He has also had a chest x-ray done that he says was unremarkable. He has not felt much better and continues to cough up phlegm that is mostly clear but occasionally yellow. He denies shortness of breath, night sweats, fever, chills, or hemoptysis. The cough is most severe when he lays down to go to bed and the cough keeps him awake at night.  Outpatient Medications Prior to Visit  Medication Sig Dispense Refill  . aspirin EC 81 MG tablet Take 1 tablet (81 mg total) by mouth daily. 90 tablet 3  . Azilsartan-Chlorthalidone (EDARBYCLOR) 40-12.5 MG TABS Take 1 tablet by mouth daily. 90 tablet 1  . Blood Glucose Monitoring Suppl (ONETOUCH VERIO IQ SYSTEM) w/Device KIT 1 Act by Does not apply route 3 (three) times daily. 2 kit 0  . fexofenadine (ALLEGRA) 180 MG tablet Take 180 mg by mouth daily as needed.      Marland Kitchen glucose blood (ONETOUCH VERIO) test strip Use TID 100 each 11  . pravastatin (PRAVACHOL) 40 MG tablet TAKE ONE TABLET AT BEDTIME. 90 tablet 3  . zolpidem (AMBIEN) 10 MG tablet TAKE 1 TABLET AT BEDTIME AS NEEDED FOR SLEEP. 30 tablet 3   No facility-administered medications prior to visit.     ROS Review of Systems  Constitutional: Negative for activity change, appetite change, chills, diaphoresis and fatigue.  HENT: Negative.  Negative for facial swelling, sore throat and trouble swallowing.   Eyes: Negative.   Respiratory: Positive for cough. Negative for choking, chest tightness, shortness of breath and wheezing.   Cardiovascular: Negative for chest pain, palpitations and leg swelling.  Gastrointestinal: Negative.  Negative for abdominal pain,  diarrhea, nausea and vomiting.  Endocrine: Negative.   Genitourinary: Negative.   Musculoskeletal: Negative.  Negative for back pain and neck pain.  Skin: Negative for color change and rash.  Neurological: Negative.  Negative for dizziness, weakness and numbness.  Hematological: Negative.  Negative for adenopathy. Does not bruise/bleed easily.  Psychiatric/Behavioral: Negative.     Objective:  BP (!) 142/92 (BP Location: Left Arm, Patient Position: Sitting, Cuff Size: Large)   Pulse 67   Temp 98.5 F (36.9 C) (Oral)   Resp 16   Ht 5' 8.5" (1.74 m)   Wt 234 lb (106.1 kg)   SpO2 97%   BMI 35.06 kg/m   BP Readings from Last 3 Encounters:  04/16/16 (!) 142/92  03/07/16 124/84  10/23/15 138/82    Wt Readings from Last 3 Encounters:  04/16/16 234 lb (106.1 kg)  03/07/16 229 lb 4 oz (104 kg)  10/23/15 230 lb (104.3 kg)    Physical Exam  Constitutional: He is oriented to person, place, and time. No distress.  HENT:  Mouth/Throat: Oropharynx is clear and moist. No oropharyngeal exudate.  Eyes: Conjunctivae are normal. Right eye exhibits no discharge. Left eye exhibits no discharge. No scleral icterus.  Neck: Normal range of motion. Neck supple. No JVD present. No tracheal deviation present. No thyromegaly present.  Cardiovascular: Normal rate, regular rhythm, normal heart sounds and intact distal pulses.  Exam reveals no gallop and no friction rub.   No murmur heard. Pulmonary/Chest: Effort normal and  breath sounds normal. No stridor. No respiratory distress. He has no wheezes. He has no rales. He exhibits no tenderness.  Abdominal: Soft. Bowel sounds are normal. He exhibits no distension and no mass. There is no tenderness. There is no rebound and no guarding.  Musculoskeletal: Normal range of motion. He exhibits no edema, tenderness or deformity.  Lymphadenopathy:    He has no cervical adenopathy.  Neurological: He is oriented to person, place, and time.  Skin: Skin is warm  and dry. No rash noted. He is not diaphoretic. No erythema. No pallor.  Vitals reviewed.   Lab Results  Component Value Date   WBC 7.8 03/07/2016   HGB 15.9 03/07/2016   HCT 45.9 03/07/2016   PLT 197.0 03/07/2016   GLUCOSE 114 (H) 03/07/2016   CHOL 160 03/07/2016   TRIG 53.0 03/07/2016   HDL 46.60 03/07/2016   LDLDIRECT 97.5 05/19/2008   LDLCALC 103 (H) 03/07/2016   ALT 30 03/29/2015   AST 21 03/29/2015   NA 141 03/07/2016   K 4.3 03/07/2016   CL 103 03/07/2016   CREATININE 1.12 03/07/2016   BUN 19 03/07/2016   CO2 34 (H) 03/07/2016   TSH 2.46 03/29/2015   PSA 1.17 03/07/2016   HGBA1C 5.9 03/07/2016   MICROALBUR 1.1 12/08/2014    No results found.  Assessment & Plan:   Devin Roberts was seen today for cough.  Diagnoses and all orders for this visit:  Cough- his chest x-ray is negative for infection or mass, will treat for viral URI -     Cancel: DG Chest 2 View; Future -     DG Chest 2 View; Future  Essential hypertension- his blood pressure is adequately well controlled  Viral URI with cough -     HYDROcodone-homatropine (HYCODAN) 5-1.5 MG/5ML syrup; Take 5 mLs by mouth every 8 (eight) hours as needed for cough.   I am having Devin Roberts start on HYDROcodone-homatropine. I am also having him maintain his fexofenadine, glucose blood, ONETOUCH VERIO IQ SYSTEM, aspirin EC, pravastatin, zolpidem, and Azilsartan-Chlorthalidone.  Meds ordered this encounter  Medications  . HYDROcodone-homatropine (HYCODAN) 5-1.5 MG/5ML syrup    Sig: Take 5 mLs by mouth every 8 (eight) hours as needed for cough.    Dispense:  120 mL    Refill:  0     Follow-up: Return if symptoms worsen or fail to improve.  [name], MD

## 2016-04-16 NOTE — Patient Instructions (Signed)
Cough, Adult Coughing is a reflex that clears your throat and your airways. Coughing helps to heal and protect your lungs. It is normal to cough occasionally, but a cough that happens with other symptoms or lasts a long time may be a sign of a condition that needs treatment. A cough may last only 2-3 weeks (acute), or it may last longer than 8 weeks (chronic). What are the causes? Coughing is commonly caused by:  Breathing in substances that irritate your lungs.  A viral or bacterial respiratory infection.  Allergies.  Asthma.  Postnasal drip.  Smoking.  Acid backing up from the stomach into the esophagus (gastroesophageal reflux).  Certain medicines.  Chronic lung problems, including COPD (or rarely, lung cancer).  Other medical conditions such as heart failure.  Follow these instructions at home: Pay attention to any changes in your symptoms. Take these actions to help with your discomfort:  Take medicines only as told by your health care provider. ? If you were prescribed an antibiotic medicine, take it as told by your health care provider. Do not stop taking the antibiotic even if you start to feel better. ? Talk with your health care provider before you take a cough suppressant medicine.  Drink enough fluid to keep your urine clear or pale yellow.  If the air is dry, use a cold steam vaporizer or humidifier in your bedroom or your home to help loosen secretions.  Avoid anything that causes you to cough at work or at home.  If your cough is worse at night, try sleeping in a semi-upright position.  Avoid cigarette smoke. If you smoke, quit smoking. If you need help quitting, ask your health care provider.  Avoid caffeine.  Avoid alcohol.  Rest as needed.  Contact a health care provider if:  You have new symptoms.  You cough up pus.  Your cough does not get better after 2-3 weeks, or your cough gets worse.  You cannot control your cough with suppressant  medicines and you are losing sleep.  You develop pain that is getting worse or pain that is not controlled with pain medicines.  You have a fever.  You have unexplained weight loss.  You have night sweats. Get help right away if:  You cough up blood.  You have difficulty breathing.  Your heartbeat is very fast. This information is not intended to replace advice given to you by your health care provider. Make sure you discuss any questions you have with your health care provider. Document Released: 09/07/2010 Document Revised: 08/17/2015 Document Reviewed: 05/18/2014 Elsevier Interactive Patient Education  2017 [employer]  

## 2016-04-16 NOTE — Progress Notes (Signed)
Pre visit review using our clinic review tool, if applicable. No additional management support is needed unless otherwise documented below in the visit note. 

## 2016-04-20 ENCOUNTER — Encounter: Payer: Self-pay | Admitting: Internal Medicine

## 2016-05-13 ENCOUNTER — Encounter: Payer: Self-pay | Admitting: Internal Medicine

## 2016-05-13 ENCOUNTER — Ambulatory Visit (HOSPITAL_COMMUNITY)
Admission: RE | Admit: 2016-05-13 | Discharge: 2016-05-13 | Disposition: A | Discharge status: Home / Self Care | Payer: BLUE CROSS/BLUE SHIELD | Source: Ambulatory Visit | Attending: Internal Medicine | Admitting: Internal Medicine

## 2016-05-13 DIAGNOSIS — R0989 Other specified symptoms and signs involving the circulatory and respiratory systems: Secondary | ICD-10-CM

## 2016-06-14 ENCOUNTER — Other Ambulatory Visit: Payer: Self-pay | Admitting: Internal Medicine

## 2016-06-14 NOTE — Telephone Encounter (Signed)
Done hardcopy to Shirron  

## 2016-06-14 NOTE — Telephone Encounter (Signed)
Per shirron she has faxed script back to [facility]...Devin Roberts/lmb

## 2016-06-14 NOTE — Telephone Encounter (Signed)
MD put of office pls advise on refill...Raechel Chute/lmb

## 2016-07-22 ENCOUNTER — Telehealth: Payer: Self-pay

## 2016-07-22 MED ORDER — ZOSTER VAC RECOMB ADJUVANTED 50 MCG/0.5ML IM SUSR: 0.5000 mL | Freq: Once | INTRAMUSCULAR | 1 refills | Status: AC

## 2016-07-22 NOTE — Telephone Encounter (Signed)
Request for shingrix vaccine. erx sent to [facility]

## 2016-08-12 ENCOUNTER — Ambulatory Visit: Payer: BLUE CROSS/BLUE SHIELD | Admitting: Podiatry

## 2016-08-14 ENCOUNTER — Other Ambulatory Visit: Payer: Self-pay | Admitting: Internal Medicine

## 2016-09-02 ENCOUNTER — Ambulatory Visit: Payer: BLUE CROSS/BLUE SHIELD | Admitting: Internal Medicine

## 2016-09-02 ENCOUNTER — Other Ambulatory Visit (INDEPENDENT_AMBULATORY_CARE_PROVIDER_SITE_OTHER): Payer: BLUE CROSS/BLUE SHIELD

## 2016-09-02 ENCOUNTER — Ambulatory Visit (INDEPENDENT_AMBULATORY_CARE_PROVIDER_SITE_OTHER): Payer: BLUE CROSS/BLUE SHIELD | Admitting: Internal Medicine

## 2016-09-02 ENCOUNTER — Encounter: Payer: Self-pay | Admitting: Internal Medicine

## 2016-09-02 VITALS — BP 150/90 | HR 72 | Temp 98.2°F | Resp 12 | Ht 68.5 in | Wt 231.0 lb

## 2016-09-02 DIAGNOSIS — E118 Type 2 diabetes mellitus with unspecified complications: Secondary | ICD-10-CM | POA: Diagnosis not present

## 2016-09-02 DIAGNOSIS — E1165 Type 2 diabetes mellitus with hyperglycemia: Secondary | ICD-10-CM

## 2016-09-02 DIAGNOSIS — I10 Essential (primary) hypertension: Secondary | ICD-10-CM

## 2016-09-02 DIAGNOSIS — IMO0002 Reserved for concepts with insufficient information to code with codable children: Secondary | ICD-10-CM

## 2016-09-02 LAB — BASIC METABOLIC PANEL
BUN: 20 mg/dL (ref 6–23)
CALCIUM: 9.9 mg/dL (ref 8.4–10.5)
CO2: 30 mEq/L (ref 19–32)
Chloride: 104 mEq/L (ref 96–112)
Creatinine, Ser: 1.16 mg/dL (ref 0.40–1.50)
GFR: 69.6 mL/min (ref >60.00)
Glucose, Bld: 116 mg/dL — ABNORMAL HIGH (ref 70–99)
Potassium: 4 mEq/L (ref 3.5–5.1)
SODIUM: 141 meq/L (ref 135–145)

## 2016-09-02 LAB — HEMOGLOBIN A1C: HEMOGLOBIN A1C: 6.3 % (ref 4.6–6.5)

## 2016-09-02 NOTE — Progress Notes (Signed)
Subjective:  Patient ID: Devin Roberts, male    DOB: 1960-05-06  Age: 56 y.o. MRN: [id number]  CC: Hypertension and Diabetes   HPI ADEDAMOLA SETO presents for f/up - He feels well and offers no complaints. He is very active. He is working on his lifestyle modifications and denies DOE, CP, SOB, palpitations, edema, or fatigue.  Outpatient Medications Prior to Visit  Medication Sig Dispense Refill  . aspirin EC 81 MG tablet Take 1 tablet (81 mg total) by mouth daily. 90 tablet 3  . Blood Glucose Monitoring Suppl (ONETOUCH VERIO IQ SYSTEM) w/Device KIT 1 Act by Does not apply route 3 (three) times daily. 2 kit 0  . EDARBYCLOR 40-12.5 MG TABS TAKE 1 TABLET ONCE DAILY. 90 tablet 1  . fexofenadine (ALLEGRA) 180 MG tablet Take 180 mg by mouth daily as needed.      Marland Kitchen glucose blood (ONETOUCH VERIO) test strip Use TID 100 each 11  . pravastatin (PRAVACHOL) 40 MG tablet TAKE ONE TABLET AT BEDTIME. 90 tablet 3  . zolpidem (AMBIEN) 10 MG tablet TAKE 1 TABLET AT BEDTIME AS NEEDED FOR SLEEP. 30 tablet 5  . HYDROcodone-homatropine (HYCODAN) 5-1.5 MG/5ML syrup Take 5 mLs by mouth every 8 (eight) hours as needed for cough. 120 mL 0   No facility-administered medications prior to visit.     ROS Review of Systems  Constitutional: Negative.  Negative for appetite change, chills, diaphoresis, fatigue and unexpected weight change.  HENT: Negative.  Negative for trouble swallowing.   Eyes: Negative for visual disturbance.  Respiratory: Negative for apnea, cough, chest tightness, shortness of breath and wheezing.   Cardiovascular: Negative.  Negative for chest pain, palpitations and leg swelling.  Gastrointestinal: Negative for abdominal pain, constipation, diarrhea, nausea and vomiting.  Endocrine: Negative.  Negative for polyphagia and polyuria.  Genitourinary: Negative.  Negative for difficulty urinating.  Musculoskeletal: Negative.  Negative for back pain, myalgias and neck pain.  Skin: Negative.   Negative for color change and rash.  Allergic/Immunologic: Negative.   Neurological: Negative.  Negative for dizziness, weakness and light-headedness.  Hematological: Negative.  Negative for adenopathy. Does not bruise/bleed easily.  Psychiatric/Behavioral: Negative.     Objective:  BP (!) 150/90 (BP Location: Left Arm, Patient Position: Sitting, Cuff Size: Normal)   Pulse 72   Temp 98.2 F (36.8 C) (Oral)   Resp 12   Ht 5' 8.5" (1.74 m)   Wt 231 lb (104.8 kg)   SpO2 98%   BMI 34.61 kg/m   BP Readings from Last 3 Encounters:  09/02/16 (!) 150/90  04/16/16 (!) 142/92  03/07/16 124/84    Wt Readings from Last 3 Encounters:  09/02/16 231 lb (104.8 kg)  04/16/16 234 lb (106.1 kg)  03/07/16 229 lb 4 oz (104 kg)    Physical Exam  Constitutional: He is oriented to person, place, and time. No distress.  HENT:  Mouth/Throat: Oropharynx is clear and moist. No oropharyngeal exudate.  Eyes: Conjunctivae are normal. Right eye exhibits no discharge. Left eye exhibits no discharge. No scleral icterus.  Neck: Normal range of motion. Neck supple. No JVD present. No thyromegaly present.  Cardiovascular: Normal rate and regular rhythm.  Exam reveals no gallop.   No murmur heard. Pulmonary/Chest: Effort normal and breath sounds normal. No respiratory distress. He has no wheezes. He has no rales. He exhibits no tenderness.  Abdominal: Soft. Bowel sounds are normal. He exhibits no distension and no mass. There is no tenderness. There is no rebound  and no guarding.  Musculoskeletal: Normal range of motion. He exhibits no edema, tenderness or deformity.  Lymphadenopathy:    He has no cervical adenopathy.  Neurological: He is alert and oriented to person, place, and time.  Skin: Skin is warm and dry. No rash noted. He is not diaphoretic. No erythema. No pallor.  Vitals reviewed.   Lab Results  Component Value Date   WBC 7.8 03/07/2016   HGB 15.9 03/07/2016   HCT 45.9 03/07/2016   PLT  197.0 03/07/2016   GLUCOSE 116 (H) 09/02/2016   CHOL 160 03/07/2016   TRIG 53.0 03/07/2016   HDL 46.60 03/07/2016   LDLDIRECT 97.5 05/19/2008   LDLCALC 103 (H) 03/07/2016   ALT 30 03/29/2015   AST 21 03/29/2015   NA 141 09/02/2016   K 4.0 09/02/2016   CL 104 09/02/2016   CREATININE 1.16 09/02/2016   BUN 20 09/02/2016   CO2 30 09/02/2016   TSH 2.46 03/29/2015   PSA 1.17 03/07/2016   HGBA1C 6.3 09/02/2016   MICROALBUR 1.1 12/08/2014    No results found.  Assessment & Plan:   Zedrick was seen today for hypertension and diabetes.  Diagnoses and all orders for this visit:  Uncontrolled type 2 diabetes mellitus with complication, without long-term current use of insulin (HCC)- His A1c is up to 6.3%, he is prediabetic, he does not need a medication to treat this but he does agree to improve his lifestyle modifications. -     Basic metabolic panel; Future -     Hemoglobin A1c; Future  Essential hypertension- his blood pressure is not adequately well controlled but rather than add another medication he chooses to improve his lifestyle modifications, for now will continue the combination of an ARB and thiazide diuretic, his electrolytes and renal function are normal today. -     Basic metabolic panel; Future   I have discontinued Mr. Ciampi's HYDROcodone-homatropine. I am also having him maintain his fexofenadine, glucose blood, ONETOUCH VERIO IQ SYSTEM, aspirin EC, pravastatin, zolpidem, and EDARBYCLOR.  No orders of the defined types were placed in this encounter.    Follow-up: Return in about 6 months (around 03/04/2017).   , MD 

## 2016-09-02 NOTE — Patient Instructions (Signed)

## 2016-09-05 DIAGNOSIS — L0390 Cellulitis, unspecified: Secondary | ICD-10-CM | POA: Diagnosis not present

## 2016-09-23 ENCOUNTER — Encounter: Payer: Self-pay | Admitting: Podiatry

## 2016-09-23 ENCOUNTER — Ambulatory Visit (INDEPENDENT_AMBULATORY_CARE_PROVIDER_SITE_OTHER): Payer: BLUE CROSS/BLUE SHIELD | Admitting: Podiatry

## 2016-09-23 DIAGNOSIS — M7662 Achilles tendinitis, left leg: Secondary | ICD-10-CM

## 2016-09-23 NOTE — Progress Notes (Signed)
Subjective:    Patient ID: Devin Roberts, male   DOB: 56 y.o.   MRN: [id number]   HPI patient states that he needs new inserts and that his Achilles so far seen pretty good    ROS      Objective:  Physical Exam neurovascular status intact negative Homans sign was noted with diminished discomfort around the posterior heel region left with mild discomfort but only upon deep palpation     Assessment:    Doing well post Achilles tendinitis     Plan:    Patient is at this time scanned for new customized orthotics with heel lifts bilateral and also I've recommended he get a second pair recovered by Ria Clockick Puckett when he sees him to dispense in 3 weeks

## 2016-10-09 ENCOUNTER — Telehealth: Payer: Self-pay | Admitting: Podiatry

## 2016-10-09 NOTE — Telephone Encounter (Signed)
Left voicemail for patient to call back and schedule appointment to see Raiford NobleRick to pick up his orthotics.

## 2016-10-21 ENCOUNTER — Encounter: Payer: BLUE CROSS/BLUE SHIELD | Admitting: Orthotics

## 2016-11-07 DIAGNOSIS — H5203 Hypermetropia, bilateral: Secondary | ICD-10-CM | POA: Diagnosis not present

## 2016-11-07 DIAGNOSIS — E119 Type 2 diabetes mellitus without complications: Secondary | ICD-10-CM | POA: Diagnosis not present

## 2016-11-15 ENCOUNTER — Other Ambulatory Visit: Payer: Self-pay | Admitting: Internal Medicine

## 2016-11-26 LAB — HM DIABETES EYE EXAM

## 2016-12-17 ENCOUNTER — Other Ambulatory Visit: Payer: Self-pay | Admitting: Internal Medicine

## 2016-12-18 NOTE — Telephone Encounter (Signed)
Rx was faxed to pharmacy by MD assistant...Raechel Chute

## 2017-01-25 ENCOUNTER — Emergency Department (HOSPITAL_COMMUNITY)
Admission: EM | Admit: 2017-01-25 | Discharge: 2017-01-25 | Disposition: A | Discharge status: Home / Self Care | Payer: BLUE CROSS/BLUE SHIELD | Attending: Emergency Medicine | Admitting: Emergency Medicine

## 2017-01-25 ENCOUNTER — Encounter (HOSPITAL_COMMUNITY): Payer: Self-pay | Admitting: Emergency Medicine

## 2017-01-25 ENCOUNTER — Emergency Department (HOSPITAL_COMMUNITY): Payer: BLUE CROSS/BLUE SHIELD

## 2017-01-25 DIAGNOSIS — I10 Essential (primary) hypertension: Secondary | ICD-10-CM | POA: Diagnosis not present

## 2017-01-25 DIAGNOSIS — N201 Calculus of ureter: Secondary | ICD-10-CM

## 2017-01-25 DIAGNOSIS — R109 Unspecified abdominal pain: Secondary | ICD-10-CM

## 2017-01-25 DIAGNOSIS — K5730 Diverticulosis of large intestine without perforation or abscess without bleeding: Secondary | ICD-10-CM | POA: Diagnosis not present

## 2017-01-25 DIAGNOSIS — E119 Type 2 diabetes mellitus without complications: Secondary | ICD-10-CM | POA: Diagnosis not present

## 2017-01-25 DIAGNOSIS — E7800 Pure hypercholesterolemia, unspecified: Secondary | ICD-10-CM | POA: Insufficient documentation

## 2017-01-25 DIAGNOSIS — N132 Hydronephrosis with renal and ureteral calculous obstruction: Secondary | ICD-10-CM | POA: Diagnosis not present

## 2017-01-25 DIAGNOSIS — R1084 Generalized abdominal pain: Secondary | ICD-10-CM | POA: Diagnosis not present

## 2017-01-25 DIAGNOSIS — Z79899 Other long term (current) drug therapy: Secondary | ICD-10-CM | POA: Insufficient documentation

## 2017-01-25 DIAGNOSIS — M545 Low back pain: Secondary | ICD-10-CM | POA: Diagnosis not present

## 2017-01-25 LAB — CBC
HEMATOCRIT: 43.2 % (ref 39.0–52.0)
HEMOGLOBIN: 15 g/dL (ref 13.0–17.0)
MCH: 32.3 pg (ref 26.0–34.0)
MCHC: 34.7 g/dL (ref 30.0–36.0)
MCV: 93.1 fL (ref 78.0–100.0)
Platelets: 197 10*3/uL (ref 150–400)
RBC: 4.64 MIL/uL (ref 4.22–5.81)
RDW: 12.4 % (ref 11.5–15.5)
WBC: 14.2 10*3/uL — ABNORMAL HIGH (ref 4.0–10.5)

## 2017-01-25 LAB — BASIC METABOLIC PANEL
ANION GAP: 7 (ref 5–15)
BUN: 24 mg/dL — ABNORMAL HIGH (ref 6–20)
CHLORIDE: 95 mmol/L — AB (ref 101–111)
CO2: 26 mmol/L (ref 22–32)
Calcium: 8.9 mg/dL (ref 8.9–10.3)
Creatinine, Ser: 1.64 mg/dL — ABNORMAL HIGH (ref 0.61–1.24)
GFR calc non Af Amer: 46 mL/min — ABNORMAL LOW (ref >60)
GFR, EST AFRICAN AMERICAN: 53 mL/min — AB (ref >60)
GLUCOSE: 197 mg/dL — AB (ref 65–99)
POTASSIUM: 4 mmol/L (ref 3.5–5.1)
Sodium: 128 mmol/L — ABNORMAL LOW (ref 135–145)

## 2017-01-25 LAB — URINALYSIS, ROUTINE W REFLEX MICROSCOPIC
Bacteria, UA: NONE SEEN
Bilirubin Urine: NEGATIVE
GLUCOSE, UA: 150 mg/dL — AB
Ketones, ur: NEGATIVE mg/dL
Leukocytes, UA: NEGATIVE
Nitrite: NEGATIVE
PH: 6 (ref 5.0–8.0)
Protein, ur: 30 mg/dL — AB
SPECIFIC GRAVITY, URINE: 1.031 — AB (ref 1.005–1.030)
Squamous Epithelial / LPF: NONE SEEN

## 2017-01-25 MED ORDER — ONDANSETRON 4 MG PO TBDP
4.0000 mg | ORAL_TABLET | Freq: Once | ORAL | Status: AC | PRN
  Administered 2017-01-25: 4 mg via ORAL

## 2017-01-25 MED ORDER — ONDANSETRON 4 MG PO TBDP
ORAL_TABLET | ORAL | Status: AC
  Administered 2017-01-25: 4 mg via ORAL
  Filled 2017-01-25: qty 1

## 2017-01-25 MED ORDER — HYDROCODONE-ACETAMINOPHEN 5-325 MG PO TABS
1.0000 | ORAL_TABLET | Freq: Once | ORAL | Status: AC
  Administered 2017-01-25: 1 via ORAL
  Filled 2017-01-25: qty 1

## 2017-01-25 MED ORDER — TAMSULOSIN HCL 0.4 MG PO CAPS: 0.4000 mg | ORAL_CAPSULE | Freq: Every day | ORAL | 0 refills | Status: DC

## 2017-01-25 MED ORDER — FENTANYL CITRATE (PF) 100 MCG/2ML IJ SOLN
INTRAMUSCULAR | Status: AC
  Administered 2017-01-25: 50 ug via NASAL
  Filled 2017-01-25: qty 2

## 2017-01-25 MED ORDER — FENTANYL CITRATE (PF) 100 MCG/2ML IJ SOLN
50.0000 ug | INTRAMUSCULAR | Status: DC | PRN
  Administered 2017-01-25: 50 ug via NASAL

## 2017-01-25 MED ORDER — HYDROCODONE-ACETAMINOPHEN 5-325 MG PO TABS: 1.0000 | ORAL_TABLET | Freq: Four times a day (QID) | ORAL | 0 refills | Status: DC | PRN

## 2017-01-25 MED ORDER — ONDANSETRON 4 MG PO TBDP: 4.0000 mg | ORAL_TABLET | Freq: Three times a day (TID) | ORAL | 0 refills | Status: DC | PRN

## 2017-01-25 MED ORDER — CEPHALEXIN 500 MG PO CAPS: 500.0000 mg | ORAL_CAPSULE | Freq: Three times a day (TID) | ORAL | 0 refills | Status: AC

## 2017-01-25 NOTE — ED Provider Notes (Signed)
[facility] EMERGENCY DEPARTMENT Provider Note   CSN: [id number] Arrival date & time: 01/25/17  1541     History   Chief Complaint Chief Complaint  Patient presents with  . Flank Pain    HPI Devin Roberts is a 56 y.o. male who presents today for evaluation of left-sided flank pain from [facility] urgent care to rule out kidney stones.  He has a history of hypertension, obesity, diabetes.  He does not have a history of kidney stones.  He was given Phenergan and Toradol by urgent care.  Here he was given zofran and fentanyl for his pain prior to my evaluation.    He currently reports that his pain is "less than an ache" and denies any nausea.  He reports that his pain is waxing and waning, and that when it was bad he became sweaty and vomited.  No nausea.  Says that when he is not having pain he does not feel sweaty.  No dysuria, increased frequency, or urgency.  He denies any obvious blood in his urine.  HPI  Past Medical History:  Diagnosis Date  . Acute asthmatic bronchitis   . Allergic rhinitis   . Anxiety   . Chronic insomnia   . Diabetes mellitus   . DJD (degenerative joint disease)   . GERD (gastroesophageal reflux disease)   . Headache(784.0)   . Hypercholesterolemia   . Hypertension   . Obesity     Patient Active Problem List   Diagnosis Date Noted  . Diverticulosis of large intestine without hemorrhage 01/25/2017  . Snoring 01/06/2014  . Type II diabetes mellitus with complication, uncontrolled (HCC) 03/10/2012  . Insomnia 03/10/2012  . Annual physical exam 12/07/2010  . Hyperlipidemia with target LDL less than 100 01/31/2007  . Obesity 01/31/2007  . Essential hypertension 01/31/2007  . Allergic rhinitis 01/31/2007  . GERD 01/31/2007    Past Surgical History:  Procedure Laterality Date  . SHOULDER SURGERY         Home Medications    Prior to Admission medications   Medication Sig Start Date End Date Taking? Authorizing  Provider  aspirin EC 81 MG tablet Take 1 tablet (81 mg total) by mouth daily. 05/15/15  Yes Etta Grandchild, MD  EDARBYCLOR 40-12.5 MG TABS TAKE 1 TABLET ONCE DAILY. 08/14/16  Yes Etta Grandchild, MD  fexofenadine (ALLEGRA) 180 MG tablet Take 180 mg by mouth daily as needed.     Yes [provider]  pravastatin (PRAVACHOL) 40 MG tablet TAKE ONE TABLET AT BEDTIME. Patient taking differently: TAKE 40 mg TABLET AT BEDTIME. 11/15/16  Yes Etta Grandchild, MD  Pseudoephedrine-Guaifenesin [facility] D PO) Take 1 tablet by mouth as needed. 1200 mg   Yes [provider]  zolpidem (AMBIEN) 10 MG tablet TAKE 1 TABLET AT BEDTIME AS NEEDED FOR SLEEP. Patient taking differently: TAKE 10 mg TABLET AT BEDTIME AS NEEDED FOR SLEEP. 12/17/16  Yes Etta Grandchild, MD  cephALEXin (KEFLEX) 500 MG capsule Take 1 capsule (500 mg total) by mouth 3 (three) times daily. 01/25/17 02/04/17  Cristina Gong, PA-C  HYDROcodone-acetaminophen (NORCO/VICODIN) 5-325 MG tablet Take 1-2 tablets by mouth every 6 (six) hours as needed for severe pain. 01/25/17   Cristina Gong, PA-C  ondansetron (ZOFRAN ODT) 4 MG disintegrating tablet Take 1 tablet (4 mg total) by mouth every 8 (eight) hours as needed for nausea or vomiting. 01/25/17   Cristina Gong, PA-C  tamsulosin (FLOMAX) 0.4 MG  CAPS capsule Take 1 capsule (0.4 mg total) by mouth daily. 01/25/17   Cristina Gong, PA-C    Family History Family History  Problem Relation Age of Onset  . COPD Father   . Lung cancer Father   . Hyperlipidemia Mother   . Cancer Neg Hx   . Diabetes Neg Hx   . Heart disease Neg Hx   . Hypertension Neg Hx   . Kidney disease Neg Hx   . Stroke Neg Hx     Social History Social History   Tobacco Use  . Smoking status: Never Smoker  . Smokeless tobacco: Never Used  Substance Use Topics  . Alcohol use: No  . Drug use: No     Allergies   Lisinopril and Simvastatin   Review of Systems Review of Systems    Constitutional: Negative for chills and fever.  HENT: Negative for ear pain and sore throat.   Eyes: Negative for pain and visual disturbance.  Respiratory: Negative for cough and shortness of breath.   Cardiovascular: Negative for chest pain and palpitations.  Gastrointestinal: Positive for nausea and vomiting. Negative for abdominal pain, constipation and diarrhea.  Genitourinary: Positive for flank pain. Negative for difficulty urinating, dysuria and hematuria.  Musculoskeletal: Negative for arthralgias and back pain.  Skin: Negative for color change and rash.  Neurological: Negative for seizures, syncope, light-headedness and headaches.  All other systems reviewed and are negative.    Physical Exam Updated Vital Signs BP 133/78   Pulse 61   Temp 98.7 F (37.1 C) (Oral)   Resp 18   SpO2 97%   Physical Exam  Constitutional: He appears well-developed and well-nourished.  HENT:  Head: Normocephalic and atraumatic.  Eyes: Conjunctivae are normal.  Neck: Normal range of motion. Neck supple.  Cardiovascular: Normal rate and regular rhythm.   No murmur heard. Pulmonary/Chest: Effort normal and breath sounds normal. No respiratory distress.  Abdominal: Soft. Normal appearance. There is no tenderness. There is CVA tenderness (Left sided very mild tnderness to percussion). There is no rigidity, no guarding, no tenderness at McBurney's point and negative Murphy's sign.  Musculoskeletal: He exhibits no edema.  Neurological: He is alert.  Skin: Skin is warm and dry. He is not diaphoretic.  Psychiatric: He has a normal mood and affect.  Nursing note and vitals reviewed.    ED Treatments / Results  Labs (all labs ordered are listed, but only abnormal results are displayed) Labs Reviewed  URINALYSIS, ROUTINE W REFLEX MICROSCOPIC - Abnormal; Notable for the following components:      Result Value   Color, Urine AMBER (*)    APPearance HAZY (*)    Specific Gravity, Urine 1.031 (*)     Glucose, UA 150 (*)    Hgb urine dipstick MODERATE (*)    Protein, ur 30 (*)    All other components within normal limits  BASIC METABOLIC PANEL - Abnormal; Notable for the following components:   Sodium 128 (*)    Chloride 95 (*)    Glucose, Bld 197 (*)    BUN 24 (*)    Creatinine, Ser 1.64 (*)    GFR calc non Af Amer 46 (*)    GFR calc Af Amer 53 (*)    All other components within normal limits  CBC - Abnormal; Notable for the following components:   WBC 14.2 (*)    All other components within normal limits    EKG  EKG Interpretation None  Radiology Ct Renal Stone Study  Result Date: 01/25/2017 CLINICAL DATA:  Left flank pain.  No history of stones. EXAM: CT ABDOMEN AND PELVIS WITHOUT CONTRAST TECHNIQUE: Multidetector CT imaging of the abdomen and pelvis was performed following the standard protocol without IV contrast. COMPARISON:  None. FINDINGS: Lower chest: No acute abnormality. Hepatobiliary: No focal liver abnormality is seen. No gallstones, gallbladder wall thickening, or biliary dilatation. Pancreas: Unremarkable. No pancreatic ductal dilatation or surrounding inflammatory changes. Spleen: Normal in size without focal abnormality. Adrenals/Urinary Tract: Adrenal glands are normal. There is mild hydronephrosis and very nephric stranding on the left. This is due to a proximal left ureteral stone measuring up to 4.7 mm on coronal image 60. The kidneys, ureters, and bladder are otherwise unremarkable. Stomach/Bowel: The stomach and small bowel are normal. Mild colonic diverticulosis is seen without diverticulitis. The colon is otherwise normal. The appendix is normal as well. Vascular/Lymphatic: No significant vascular findings are present. No enlarged abdominal or pelvic lymph nodes. Reproductive: Prostate is unremarkable. Other: No abdominal wall hernia or abnormality. No abdominopelvic ascites. Musculoskeletal: No acute or significant osseous findings. IMPRESSION: 1.  There is a 4.7 mm stone in the proximal to mid left ureter resulting in mild hydronephrosis and perinephric stranding, explaining the patient's symptoms. 2. Mild colonic diverticulosis without diverticulitis. 3. No other significant abnormalities. Electronically Signed   By: Gerome Samavid  Williams III M.D   On: 01/25/2017 17:34    Procedures Procedures (including critical care time)  Medications Ordered in ED Medications  ondansetron (ZOFRAN-ODT) disintegrating tablet 4 mg (4 mg Oral Given 01/25/17 1657)  HYDROcodone-acetaminophen (NORCO/VICODIN) 5-325 MG per tablet 1 tablet (1 tablet Oral Given 01/25/17 1821)     Initial Impression / Assessment and Plan / ED Course  I have reviewed the triage vital signs and the nursing notes.  Pertinent labs & imaging results that were available during my care of the patient were reviewed by me and considered in my medical decision making (see chart for details).    Pt has been diagnosed with a Kidney Stone via CT. There is no evidence of significant hydronephrosis, vitals sign stable and the pt does not have irratractable vomiting. Pt will be dc home with pain medications, nausea medicines and flomax & has been advised to follow up with urologist.  As his urine showed 0-5 white blood cells, he will be covered with keflex given stone presence.  Patient given return precautions and states understanding.     Final Clinical Impressions(s) / ED Diagnoses   Final diagnoses:  Left flank pain  Diverticulosis of large intestine without hemorrhage  Ureterolithiasis    New Prescriptions Allergies as of 01/25/2017      Reactions   Lisinopril    REACTION: Allergic to ACE inhibitors w/ cough   Simvastatin    REACTION: pt states ZOCOR caused leg cramps      Medication List    TAKE these medications   cephALEXin 500 MG capsule Commonly known as:  KEFLEX Take 1 capsule (500 mg total) by mouth 3 (three) times daily.   HYDROcodone-acetaminophen 5-325 MG  tablet Commonly known as:  NORCO/VICODIN Take 1-2 tablets by mouth every 6 (six) hours as needed for severe pain.   ondansetron 4 MG disintegrating tablet Commonly known as:  ZOFRAN ODT Take 1 tablet (4 mg total) by mouth every 8 (eight) hours as needed for nausea or vomiting.   tamsulosin 0.4 MG Caps capsule Commonly known as:  FLOMAX Take 1 capsule (0.4 mg total) by mouth  daily.     ASK your doctor about these medications   aspirin EC 81 MG tablet Take 1 tablet (81 mg total) by mouth daily.   EDARBYCLOR 40-12.5 MG Tabs Generic drug:  Azilsartan-Chlorthalidone TAKE 1 TABLET ONCE DAILY.   fexofenadine 180 MG tablet Commonly known as:  ALLEGRA Take 180 mg by mouth daily as needed.   MUCINEX D PO Take 1 tablet by mouth as needed. 1200 mg   pravastatin 40 MG tablet Commonly known as:  PRAVACHOL TAKE ONE TABLET AT BEDTIME.   zolpidem 10 MG tablet Commonly known as:  AMBIEN TAKE 1 TABLET AT BEDTIME AS NEEDED FOR SLEEP.         Cristina Gong, PA-C 01/27/17 6213    Margarita Grizzle, MD 01/28/17 [phone]

## 2017-01-25 NOTE — Discharge Instructions (Signed)
Today you received medications that may make you sleepy or impair your ability to make decisions.  For the next 24 hours please do not drive, operate heavy machinery, care for a small child with out another adult present, or perform any activities that may cause harm to you or someone else if you were to fall asleep or be impaired.  ° °You are being prescribed a medication which may make you sleepy. Please follow up of listed precautions for at least 24 hours after taking one dose. ° °You may have diarrhea from the antibiotics.  It is very important that you continue to take the antibiotics even if you get diarrhea unless a medical professional tells you that you may stop taking them.  If you stop too early the bacteria you are being treated for will become stronger and you may need different, more powerful antibiotics that have more side effects and worsening diarrhea.  Please stay well hydrated and consider probiotics as they may decrease the severity of your diarrhea.   ° ° °

## 2017-01-25 NOTE — ED Triage Notes (Addendum)
Pt sent from [facility] for rule out of kidney stones. Pt has left flank pain, comes with urinalysis results from urgent care. Pt was given phenergan and Toradol by UC, pt had improvement to pain 0/10. No hx of kidney stones

## 2017-01-26 NOTE — Progress Notes (Signed)
Epic Follow up Referral order sent re: Mr Devin Roberts. Chart reviewed. Follow up requested at [facility] in 2 weeks.

## 2017-01-28 DIAGNOSIS — N132 Hydronephrosis with renal and ureteral calculous obstruction: Secondary | ICD-10-CM | POA: Diagnosis not present

## 2017-01-31 DIAGNOSIS — N200 Calculus of kidney: Secondary | ICD-10-CM | POA: Diagnosis not present

## 2017-02-03 DIAGNOSIS — N201 Calculus of ureter: Secondary | ICD-10-CM | POA: Diagnosis not present

## 2017-02-05 DIAGNOSIS — N200 Calculus of kidney: Secondary | ICD-10-CM | POA: Diagnosis not present

## 2017-02-18 ENCOUNTER — Other Ambulatory Visit: Payer: Self-pay | Admitting: Internal Medicine

## 2017-02-27 ENCOUNTER — Telehealth: Payer: Self-pay | Admitting: Internal Medicine

## 2017-02-27 MED ORDER — AZILSARTAN-CHLORTHALIDONE 40-12.5 MG PO TABS: 1.0000 | ORAL_TABLET | Freq: Every day | ORAL | 0 refills | Status: DC

## 2017-02-27 NOTE — Telephone Encounter (Signed)
See attached

## 2017-02-27 NOTE — Telephone Encounter (Signed)
Sent 30 day script until appt 04/03/17.Marland Kitchen.Raechel Chute/lmb

## 2017-02-27 NOTE — Telephone Encounter (Signed)
Copied from CRM [id number]. Topic: General - Other >> Feb 27, 2017  8:09 AM Cecelia ByarsGreen, Kidus Delman L, RMA wrote: Reason for CRM: Medication refill request for edarbyclor 40-12.5 mg to be sent to [facility]

## 2017-04-01 MED ORDER — AZILSARTAN-CHLORTHALIDONE 40-12.5 MG PO TABS: 1.0000 | ORAL_TABLET | Freq: Every day | ORAL | 0 refills | Status: DC

## 2017-04-01 NOTE — Addendum Note (Signed)
Addended by: Radford PaxAIRRIKIER DAVIDSON, Gaynor Ferreras M on: 04/01/2017 01:44 PM   Modules accepted: Orders

## 2017-04-01 NOTE — Telephone Encounter (Signed)
Last refill was not enough to last until next appt.   #30 sent

## 2017-04-03 ENCOUNTER — Ambulatory Visit: Payer: BLUE CROSS/BLUE SHIELD | Admitting: Internal Medicine

## 2017-04-03 ENCOUNTER — Encounter: Payer: Self-pay | Admitting: Internal Medicine

## 2017-04-03 ENCOUNTER — Other Ambulatory Visit (INDEPENDENT_AMBULATORY_CARE_PROVIDER_SITE_OTHER): Payer: BLUE CROSS/BLUE SHIELD

## 2017-04-03 VITALS — BP 138/90 | HR 68 | Temp 98.3°F | Resp 16 | Ht 68.5 in | Wt 238.0 lb

## 2017-04-03 DIAGNOSIS — IMO0002 Reserved for concepts with insufficient information to code with codable children: Secondary | ICD-10-CM

## 2017-04-03 DIAGNOSIS — Z0000 Encounter for general adult medical examination without abnormal findings: Secondary | ICD-10-CM | POA: Diagnosis not present

## 2017-04-03 DIAGNOSIS — M545 Low back pain, unspecified: Secondary | ICD-10-CM

## 2017-04-03 DIAGNOSIS — E118 Type 2 diabetes mellitus with unspecified complications: Secondary | ICD-10-CM

## 2017-04-03 DIAGNOSIS — E1165 Type 2 diabetes mellitus with hyperglycemia: Secondary | ICD-10-CM | POA: Diagnosis not present

## 2017-04-03 DIAGNOSIS — E785 Hyperlipidemia, unspecified: Secondary | ICD-10-CM

## 2017-04-03 DIAGNOSIS — I10 Essential (primary) hypertension: Secondary | ICD-10-CM | POA: Diagnosis not present

## 2017-04-03 LAB — URINALYSIS, ROUTINE W REFLEX MICROSCOPIC
Bilirubin Urine: NEGATIVE
Hgb urine dipstick: NEGATIVE
KETONES UR: NEGATIVE
Leukocytes, UA: NEGATIVE
Nitrite: NEGATIVE
PH: 6.5 (ref 5.0–8.0)
RBC / HPF: NONE SEEN (ref 0–?)
SPECIFIC GRAVITY, URINE: 1.02 (ref 1.000–1.030)
Total Protein, Urine: NEGATIVE
Urine Glucose: NEGATIVE
Urobilinogen, UA: 0.2 (ref 0.0–1.0)
WBC UA: NONE SEEN (ref 0–?)

## 2017-04-03 LAB — MICROALBUMIN / CREATININE URINE RATIO
CREATININE, U: 176.1 mg/dL
MICROALB/CREAT RATIO: 0.7 mg/g (ref 0.0–30.0)
Microalb, Ur: 1.2 mg/dL (ref 0.0–1.9)

## 2017-04-03 LAB — PSA: PSA: 1.25 ng/mL (ref 0.10–4.00)

## 2017-04-03 LAB — CBC WITH DIFFERENTIAL/PLATELET
Basophils Absolute: 0.1 10*3/uL (ref 0.0–0.1)
Basophils Relative: 1.1 % (ref 0.0–3.0)
EOS ABS: 0.1 10*3/uL (ref 0.0–0.7)
Eosinophils Relative: 1.2 % (ref 0.0–5.0)
HCT: 46.2 % (ref 39.0–52.0)
Hemoglobin: 15.9 g/dL (ref 13.0–17.0)
LYMPHS ABS: 1.9 10*3/uL (ref 0.7–4.0)
LYMPHS PCT: 28.8 % (ref 12.0–46.0)
MCHC: 34.5 g/dL (ref 30.0–36.0)
MCV: 93.7 fl (ref 78.0–100.0)
MONO ABS: 0.6 10*3/uL (ref 0.1–1.0)
Monocytes Relative: 9.6 % (ref 3.0–12.0)
NEUTROS ABS: 4 10*3/uL (ref 1.4–7.7)
NEUTROS PCT: 59.3 % (ref 43.0–77.0)
PLATELETS: 236 10*3/uL (ref 150.0–400.0)
RBC: 4.93 Mil/uL (ref 4.22–5.81)
RDW: 13.6 % (ref 11.5–15.5)
WBC: 6.7 10*3/uL (ref 4.0–10.5)

## 2017-04-03 LAB — LIPID PANEL
CHOL/HDL RATIO: 4
Cholesterol: 176 mg/dL (ref 0–200)
HDL: 48.7 mg/dL (ref >39.00)
LDL CALC: 107 mg/dL — AB (ref 0–99)
NonHDL: 126.92
TRIGLYCERIDES: 98 mg/dL (ref 0.0–149.0)
VLDL: 19.6 mg/dL (ref 0.0–40.0)

## 2017-04-03 LAB — COMPREHENSIVE METABOLIC PANEL
ALK PHOS: 39 U/L (ref 39–117)
ALT: 27 U/L (ref 0–53)
AST: 22 U/L (ref 0–37)
Albumin: 4.6 g/dL (ref 3.5–5.2)
BUN: 17 mg/dL (ref 6–23)
CHLORIDE: 101 meq/L (ref 96–112)
CO2: 30 meq/L (ref 19–32)
Calcium: 9.8 mg/dL (ref 8.4–10.5)
Creatinine, Ser: 1.13 mg/dL (ref 0.40–1.50)
GFR: 71.59 mL/min (ref >60.00)
GLUCOSE: 140 mg/dL — AB (ref 70–99)
POTASSIUM: 3.9 meq/L (ref 3.5–5.1)
SODIUM: 139 meq/L (ref 135–145)
TOTAL PROTEIN: 7.3 g/dL (ref 6.0–8.3)
Total Bilirubin: 0.9 mg/dL (ref 0.2–1.2)

## 2017-04-03 LAB — POCT GLYCOSYLATED HEMOGLOBIN (HGB A1C): HEMOGLOBIN A1C: 6.5

## 2017-04-03 LAB — TSH: TSH: 1.01 u[IU]/mL (ref 0.35–4.50)

## 2017-04-03 MED ORDER — MELOXICAM 15 MG PO TABS: 15.0000 mg | ORAL_TABLET | Freq: Every day | ORAL | 0 refills | Status: DC

## 2017-04-03 NOTE — Progress Notes (Signed)
Subjective:  Patient ID: Devin Roberts, male    DOB: 04/22/1960  Age: 57 y.o. MRN: [id number]  CC: Back Pain; Hypertension; Hyperlipidemia; and Diabetes   HPI Devin SavannahRickey L Thivierge presents for a CPX.  He complains of intermittent aching in his lower back.  He says that the work that he does exacerbates the pain.  He tells me he was recently seen at an urgent care center no plain film was normal.  The back pain does not radiate.  He wants a refill on meloxicam for pain relief.  He tells me his blood pressure has been well controlled and he has had no episodes of CP, DOE, palpitations, edema, or fatigue.  Outpatient Medications Prior to Visit  Medication Sig Dispense Refill  . aspirin EC 81 MG tablet Take 1 tablet (81 mg total) by mouth daily. 90 tablet 3  . Azilsartan-Chlorthalidone (EDARBYCLOR) 40-12.5 MG TABS Take 1 tablet by mouth daily. Must see MD for future refills 30 tablet 0  . pravastatin (PRAVACHOL) 40 MG tablet TAKE ONE TABLET AT BEDTIME. (Patient taking differently: TAKE 40 mg TABLET AT BEDTIME.) 90 tablet 1  . zolpidem (AMBIEN) 10 MG tablet TAKE 1 TABLET AT BEDTIME AS NEEDED FOR SLEEP. (Patient taking differently: TAKE 10 mg TABLET AT BEDTIME AS NEEDED FOR SLEEP.) 30 tablet 2  . penicillin v potassium (VEETID) 500 MG tablet TAKE 2 TABLETS BY MOUTH NOW, THEN TAKE 1 EVERY 6 HOURS UNTIL GONE  0  . fexofenadine (ALLEGRA) 180 MG tablet Take 180 mg by mouth daily as needed.      Marland Kitchen. HYDROcodone-acetaminophen (NORCO/VICODIN) 5-325 MG tablet Take 1-2 tablets by mouth every 6 (six) hours as needed for severe pain. 10 tablet 0  . ondansetron (ZOFRAN ODT) 4 MG disintegrating tablet Take 1 tablet (4 mg total) by mouth every 8 (eight) hours as needed for nausea or vomiting. 10 tablet 0  . Pseudoephedrine-Guaifenesin (MUCINEX D PO) Take 1 tablet by mouth as needed. 1200 mg    . tamsulosin (FLOMAX) 0.4 MG CAPS capsule Take 1 capsule (0.4 mg total) by mouth daily. 30 capsule 0   No  facility-administered medications prior to visit.     ROS Review of Systems  Constitutional: Positive for unexpected weight change (wt gain). Negative for diaphoresis and fatigue.  HENT: Negative.   Eyes: Negative.   Respiratory: Negative.  Negative for cough, chest tightness, shortness of breath and wheezing.   Cardiovascular: Negative for chest pain, palpitations and leg swelling.  Gastrointestinal: Negative for abdominal pain, constipation, diarrhea and vomiting.  Endocrine: Negative.   Genitourinary: Negative.  Negative for difficulty urinating, penile swelling, scrotal swelling and testicular pain.  Musculoskeletal: Positive for back pain. Negative for arthralgias and myalgias.  Skin: Negative.   Neurological: Negative.  Negative for dizziness, weakness, numbness and headaches.  Hematological: Negative.  Negative for adenopathy. Does not bruise/bleed easily.  Psychiatric/Behavioral: Negative.     Objective:  BP 138/90 (BP Location: Left Arm, Patient Position: Sitting, Cuff Size: Large)   Pulse 68   Temp 98.3 F (36.8 C) (Oral)   Resp 16   Ht 5' 8.5" (1.74 m)   Wt 238 lb (108 kg)   SpO2 97%   BMI 35.66 kg/m   BP Readings from Last 3 Encounters:  04/03/17 138/90  01/25/17 133/78  09/02/16 (!) 150/90    Wt Readings from Last 3 Encounters:  04/03/17 238 lb (108 kg)  09/02/16 231 lb (104.8 kg)  04/16/16 234 lb (106.1 kg)  Physical Exam  Constitutional: He is oriented to person, place, and time. No distress.  HENT:  Mouth/Throat: Oropharynx is clear and moist. No oropharyngeal exudate.  Eyes: Conjunctivae are normal. No scleral icterus.  Neck: Normal range of motion. No JVD present. No thyromegaly present.  Cardiovascular: Normal rate, regular rhythm and normal heart sounds. Exam reveals no gallop.  No murmur heard. Pulmonary/Chest: Effort normal and breath sounds normal. No respiratory distress. He has no wheezes. He has no rales.  Abdominal: Soft. Bowel sounds  are normal. He exhibits no mass. There is no tenderness. There is no rebound and no guarding. Hernia confirmed negative in the right inguinal area and confirmed negative in the left inguinal area.  Genitourinary: Testes normal and penis normal. Rectal exam shows no external hemorrhoid, no internal hemorrhoid, no fissure, no mass, no tenderness and guaiac negative stool. Prostate is not enlarged and not tender. Right testis shows no mass, no swelling and no tenderness. Left testis shows no mass, no swelling and no tenderness. Uncircumcised. No phimosis, paraphimosis, hypospadias, penile erythema or penile tenderness. No discharge found.  Musculoskeletal: Normal range of motion. He exhibits no edema, tenderness or deformity.  Lymphadenopathy:    He has no cervical adenopathy.       Right: No inguinal adenopathy present.       Left: No inguinal adenopathy present.  Neurological: He is alert and oriented to person, place, and time. He has normal reflexes. He displays normal reflexes. He exhibits normal muscle tone. Coordination normal.  Neg SLR in BLE  Skin: Skin is warm and dry. No rash noted. He is not diaphoretic. No erythema. No pallor.  Vitals reviewed.   Lab Results  Component Value Date   WBC 6.7 04/03/2017   HGB 15.9 04/03/2017   HCT 46.2 04/03/2017   PLT 236.0 04/03/2017   GLUCOSE 140 (H) 04/03/2017   CHOL 176 04/03/2017   TRIG 98.0 04/03/2017   HDL 48.70 04/03/2017   LDLDIRECT 97.5 05/19/2008   LDLCALC 107 (H) 04/03/2017   ALT 27 04/03/2017   AST 22 04/03/2017   NA 139 04/03/2017   K 3.9 04/03/2017   CL 101 04/03/2017   CREATININE 1.13 04/03/2017   BUN 17 04/03/2017   CO2 30 04/03/2017   TSH 1.01 04/03/2017   PSA 1.25 04/03/2017   HGBA1C 6.5 04/03/2017   MICROALBUR 1.2 04/03/2017    Ct Renal Stone Study  Result Date: 01/25/2017 CLINICAL DATA:  Left flank pain.  No history of stones. EXAM: CT ABDOMEN AND PELVIS WITHOUT CONTRAST TECHNIQUE: Multidetector CT imaging of the  abdomen and pelvis was performed following the standard protocol without IV contrast. COMPARISON:  None. FINDINGS: Lower chest: No acute abnormality. Hepatobiliary: No focal liver abnormality is seen. No gallstones, gallbladder wall thickening, or biliary dilatation. Pancreas: Unremarkable. No pancreatic ductal dilatation or surrounding inflammatory changes. Spleen: Normal in size without focal abnormality. Adrenals/Urinary Tract: Adrenal glands are normal. There is mild hydronephrosis and very nephric stranding on the left. This is due to a proximal left ureteral stone measuring up to 4.7 mm on coronal image 60. The kidneys, ureters, and bladder are otherwise unremarkable. Stomach/Bowel: The stomach and small bowel are normal. Mild colonic diverticulosis is seen without diverticulitis. The colon is otherwise normal. The appendix is normal as well. Vascular/Lymphatic: No significant vascular findings are present. No enlarged abdominal or pelvic lymph nodes. Reproductive: Prostate is unremarkable. Other: No abdominal wall hernia or abnormality. No abdominopelvic ascites. Musculoskeletal: No acute or significant osseous findings. IMPRESSION: 1.  There is a 4.7 mm stone in the proximal to mid left ureter resulting in mild hydronephrosis and perinephric stranding, explaining the patient's symptoms. 2. Mild colonic diverticulosis without diverticulitis. 3. No other significant abnormalities. Electronically Signed   By: Gerome Sam III M.D   On: 01/25/2017 17:34    Assessment & Plan:   Errik was seen today for back pain, hypertension, hyperlipidemia and diabetes.  Diagnoses and all orders for this visit:  Type II diabetes mellitus with complication, uncontrolled (HCC)- His A1c is at 6.5%.  Medical therapy is not indicated.  He was encouraged to improve on his lifestyle modifications. -     POCT glycosylated hemoglobin (Hb A1C) -     Comprehensive metabolic panel; Future -     Microalbumin / creatinine  urine ratio; Future  Essential hypertension- His blood pressure is adequately well controlled.  Electrolytes and renal function are normal. -     Comprehensive metabolic panel; Future -     CBC with Differential/Platelet; Future -     Urinalysis, Routine w reflex microscopic; Future  Hyperlipidemia with target LDL less than 100- He has achieved his LDL goal and is doing well on the statin. -     TSH; Future  Annual physical exam- Exam completed, labs reviewed, vaccines reviewed, screening for colon cancer is up-to-date, patient education material was given. -     Lipid panel; Future -     PSA; Future  Low back pain at multiple sites- He has low back pain but no alarming feature and is neurologically intact.  Will continue meloxicam as needed. -     meloxicam (MOBIC) 15 MG tablet; Take 1 tablet (15 mg total) by mouth daily.   I have discontinued Bern L. Vanderzanden's fexofenadine, Pseudoephedrine-Guaifenesin (MUCINEX D PO), ondansetron, HYDROcodone-acetaminophen, tamsulosin, and penicillin v potassium. I am also having him start on meloxicam. Additionally, I am having him maintain his aspirin EC, pravastatin, zolpidem, and Azilsartan-Chlorthalidone.  Meds ordered this encounter  Medications  . meloxicam (MOBIC) 15 MG tablet    Sig: Take 1 tablet (15 mg total) by mouth daily.    Dispense:  90 tablet    Refill:  0     Follow-up: Return in about 6 months (around 10/01/2017).  Sanda Linger, MD

## 2017-04-03 NOTE — Patient Instructions (Signed)

## 2017-04-28 ENCOUNTER — Other Ambulatory Visit: Payer: Self-pay | Admitting: Internal Medicine

## 2017-04-29 ENCOUNTER — Telehealth: Payer: Self-pay

## 2017-04-29 NOTE — Telephone Encounter (Signed)
PA started on CoverMyMeds KEY: [id number]   PA Approved Effective from 04/29/2017 through 03/24/2038  [facility] told them it just went through on our end so it may take a moment. They will recheck in an hour

## 2017-05-13 ENCOUNTER — Other Ambulatory Visit: Payer: Self-pay | Admitting: Internal Medicine

## 2017-06-26 ENCOUNTER — Other Ambulatory Visit: Payer: Self-pay | Admitting: Internal Medicine

## 2017-07-29 ENCOUNTER — Other Ambulatory Visit: Payer: Self-pay | Admitting: Internal Medicine

## 2017-09-02 DIAGNOSIS — Z86010 Personal history of colonic polyps: Secondary | ICD-10-CM | POA: Diagnosis not present

## 2017-09-02 DIAGNOSIS — Z1211 Encounter for screening for malignant neoplasm of colon: Secondary | ICD-10-CM | POA: Diagnosis not present

## 2017-09-02 DIAGNOSIS — D122 Benign neoplasm of ascending colon: Secondary | ICD-10-CM | POA: Diagnosis not present

## 2017-09-02 DIAGNOSIS — K5730 Diverticulosis of large intestine without perforation or abscess without bleeding: Secondary | ICD-10-CM | POA: Diagnosis not present

## 2017-09-02 DIAGNOSIS — K635 Polyp of colon: Secondary | ICD-10-CM | POA: Diagnosis not present

## 2017-09-02 DIAGNOSIS — D125 Benign neoplasm of sigmoid colon: Secondary | ICD-10-CM | POA: Diagnosis not present

## 2017-09-02 LAB — HM COLONOSCOPY

## 2017-09-03 ENCOUNTER — Encounter: Payer: Self-pay | Admitting: Internal Medicine

## 2017-10-27 ENCOUNTER — Other Ambulatory Visit: Payer: Self-pay | Admitting: Internal Medicine

## 2017-10-27 ENCOUNTER — Ambulatory Visit: Payer: BLUE CROSS/BLUE SHIELD | Admitting: Internal Medicine

## 2017-10-27 DIAGNOSIS — M545 Low back pain, unspecified: Secondary | ICD-10-CM

## 2017-10-27 NOTE — Telephone Encounter (Signed)
Pt has an appt scheduled for 11/13/2017

## 2017-10-28 ENCOUNTER — Other Ambulatory Visit: Payer: Self-pay | Admitting: Internal Medicine

## 2017-10-28 DIAGNOSIS — M545 Low back pain, unspecified: Secondary | ICD-10-CM

## 2017-10-28 NOTE — Telephone Encounter (Signed)
That is fine. Can you see if he is due for a physical.

## 2017-10-28 NOTE — Telephone Encounter (Signed)
Yes, perfect. Thank you.

## 2017-10-28 NOTE — Telephone Encounter (Signed)
Patient is due for an appt. Can you call and have him schedule an appt for refills of meloxicam and zolpidem.

## 2017-10-28 NOTE — Telephone Encounter (Signed)
Appears his last physical was on 04/03/2017. Is he okay to keep the 11/13/2017 appointment?

## 2017-10-28 NOTE — Telephone Encounter (Signed)
Where would you like him to schedule? First available at this time is 11/11/2017.

## 2017-10-29 ENCOUNTER — Other Ambulatory Visit: Payer: Self-pay | Admitting: Internal Medicine

## 2017-10-29 DIAGNOSIS — M545 Low back pain, unspecified: Secondary | ICD-10-CM

## 2017-11-13 ENCOUNTER — Ambulatory Visit: Payer: BLUE CROSS/BLUE SHIELD | Admitting: Internal Medicine

## 2017-11-13 ENCOUNTER — Encounter: Payer: Self-pay | Admitting: Internal Medicine

## 2017-11-13 ENCOUNTER — Other Ambulatory Visit (INDEPENDENT_AMBULATORY_CARE_PROVIDER_SITE_OTHER): Payer: BLUE CROSS/BLUE SHIELD

## 2017-11-13 VITALS — BP 140/100 | HR 68 | Temp 98.2°F | Resp 16 | Ht 68.5 in | Wt 233.5 lb

## 2017-11-13 DIAGNOSIS — E1165 Type 2 diabetes mellitus with hyperglycemia: Secondary | ICD-10-CM

## 2017-11-13 DIAGNOSIS — E118 Type 2 diabetes mellitus with unspecified complications: Secondary | ICD-10-CM

## 2017-11-13 DIAGNOSIS — E559 Vitamin D deficiency, unspecified: Secondary | ICD-10-CM | POA: Diagnosis not present

## 2017-11-13 DIAGNOSIS — IMO0002 Reserved for concepts with insufficient information to code with codable children: Secondary | ICD-10-CM

## 2017-11-13 DIAGNOSIS — I10 Essential (primary) hypertension: Secondary | ICD-10-CM

## 2017-11-13 LAB — POCT GLUCOSE (DEVICE FOR HOME USE): POC GLUCOSE: 141 mg/dL — AB (ref 70–99)

## 2017-11-13 LAB — BASIC METABOLIC PANEL
BUN: 18 mg/dL (ref 6–23)
CALCIUM: 10.1 mg/dL (ref 8.4–10.5)
CHLORIDE: 101 meq/L (ref 96–112)
CO2: 29 meq/L (ref 19–32)
CREATININE: 1.2 mg/dL (ref 0.40–1.50)
GFR: 66.64 mL/min (ref >60.00)
Glucose, Bld: 147 mg/dL — ABNORMAL HIGH (ref 70–99)
Potassium: 3.8 mEq/L (ref 3.5–5.1)
SODIUM: 138 meq/L (ref 135–145)

## 2017-11-13 LAB — POCT GLYCOSYLATED HEMOGLOBIN (HGB A1C): Hemoglobin A1C: 6.4 % — AB (ref 4.0–5.6)

## 2017-11-13 LAB — VITAMIN D 25 HYDROXY (VIT D DEFICIENCY, FRACTURES): VITD: 25.78 ng/mL — AB (ref 30.00–100.00)

## 2017-11-13 MED ORDER — AZILSARTAN-CHLORTHALIDONE 40-25 MG PO TABS: 1.0000 | ORAL_TABLET | Freq: Every day | ORAL | 1 refills | Status: DC

## 2017-11-13 MED ORDER — CHOLECALCIFEROL 50 MCG (2000 UT) PO TABS: 2.0000 | ORAL_TABLET | Freq: Every day | ORAL | 1 refills | Status: DC

## 2017-11-13 NOTE — Progress Notes (Signed)
Subjective:  Patient ID: Devin Roberts, male    DOB: [birth year]  Age: 57 y.o. MRN: [id number]  CC: Hypertension and Diabetes   HPI PAX REASONER presents for f/up - He has been able to lose 5 pounds with lifestyle modifications since I last saw him.  He denies any recent episodes of headache, blurred vision, chest pain, shortness of breath, DOE, palpitations, edema, or fatigue.  Outpatient Medications Prior to Visit  Medication Sig Dispense Refill  . aspirin EC 81 MG tablet Take 1 tablet (81 mg total) by mouth daily. 90 tablet 3  . meloxicam (MOBIC) 15 MG tablet TAKE 1 TABLET EACH DAY. 90 tablet 0  . pravastatin (PRAVACHOL) 40 MG tablet TAKE ONE TABLET AT BEDTIME. 90 tablet 1  . zolpidem (AMBIEN) 10 MG tablet TAKE 1 TABLET AT BEDTIME AS NEEDED FOR SLEEP. 30 tablet 0  . Azilsartan-Chlorthalidone (EDARBYCLOR) 40-12.5 MG TABS Take 1 tablet by mouth daily. 30 tablet 0   No facility-administered medications prior to visit.     ROS Review of Systems  Constitutional: Negative.  Negative for diaphoresis and fatigue.  HENT: Negative.   Eyes: Negative for visual disturbance.  Respiratory: Negative for cough, chest tightness, shortness of breath and wheezing.   Cardiovascular: Negative for palpitations and leg swelling.  Gastrointestinal: Negative for abdominal pain, constipation, diarrhea, nausea and vomiting.  Endocrine: Negative.  Negative for polyuria.  Genitourinary: Negative.  Negative for difficulty urinating.  Musculoskeletal: Negative.  Negative for arthralgias and myalgias.  Skin: Negative.   Neurological: Negative for dizziness, weakness, light-headedness and headaches.  Hematological: Negative for adenopathy. Does not bruise/bleed easily.  Psychiatric/Behavioral: Negative.     Objective:  BP (!) 140/100 (BP Location: Left Arm, Patient Position: Sitting, Cuff Size: Large)   Pulse 68   Temp 98.2 F (36.8 C) (Oral)   Resp 16   Ht 5' 8.5" (1.74 m)   Wt 233 lb 8 oz  (105.9 kg)   SpO2 96%   BMI 34.99 kg/m   BP Readings from Last 3 Encounters:  11/13/17 (!) 140/100  04/03/17 138/90  01/25/17 133/78    Wt Readings from Last 3 Encounters:  11/13/17 233 lb 8 oz (105.9 kg)  04/03/17 238 lb (108 kg)  09/02/16 231 lb (104.8 kg)    Physical Exam  Constitutional: He is oriented to person, place, and time. No distress.  HENT:  Mouth/Throat: Oropharynx is clear and moist. No oropharyngeal exudate.  Eyes: Conjunctivae are normal. No scleral icterus.  Neck: Normal range of motion. Neck supple. No JVD present. No thyromegaly present.  Cardiovascular: Normal rate, regular rhythm and normal heart sounds.  Pulmonary/Chest: Effort normal and breath sounds normal. He has no wheezes. He has no rales.  Abdominal: Soft. Bowel sounds are normal. He exhibits no mass. There is no hepatosplenomegaly. There is no tenderness.  Musculoskeletal: Normal range of motion. He exhibits no edema, tenderness or deformity.  Lymphadenopathy:    He has no cervical adenopathy.  Neurological: He is alert and oriented to person, place, and time.  Skin: Skin is warm and dry. No rash noted. He is not diaphoretic.  Vitals reviewed.   Lab Results  Component Value Date   WBC 6.7 04/03/2017   HGB 15.9 04/03/2017   HCT 46.2 04/03/2017   PLT 236.0 04/03/2017   GLUCOSE 147 (H) 11/13/2017   CHOL 176 04/03/2017   TRIG 98.0 04/03/2017   HDL 48.70 04/03/2017   LDLDIRECT 97.5 05/19/2008   LDLCALC 107 (H) 04/03/2017  ALT 27 04/03/2017   AST 22 04/03/2017   NA 138 11/13/2017   K 3.8 11/13/2017   CL 101 11/13/2017   CREATININE 1.20 11/13/2017   BUN 18 11/13/2017   CO2 29 11/13/2017   TSH 1.01 04/03/2017   PSA 1.25 04/03/2017   HGBA1C 6.4 (A) 11/13/2017   MICROALBUR 1.2 04/03/2017    Ct Renal Stone Study  Result Date: 01/25/2017 CLINICAL DATA:  Left flank pain.  No history of stones. EXAM: CT ABDOMEN AND PELVIS WITHOUT CONTRAST TECHNIQUE: Multidetector CT imaging of the  abdomen and pelvis was performed following the standard protocol without IV contrast. COMPARISON:  None. FINDINGS: Lower chest: No acute abnormality. Hepatobiliary: No focal liver abnormality is seen. No gallstones, gallbladder wall thickening, or biliary dilatation. Pancreas: Unremarkable. No pancreatic ductal dilatation or surrounding inflammatory changes. Spleen: Normal in size without focal abnormality. Adrenals/Urinary Tract: Adrenal glands are normal. There is mild hydronephrosis and very nephric stranding on the left. This is due to a proximal left ureteral stone measuring up to 4.7 mm on coronal image 60. The kidneys, ureters, and bladder are otherwise unremarkable. Stomach/Bowel: The stomach and small bowel are normal. Mild colonic diverticulosis is seen without diverticulitis. The colon is otherwise normal. The appendix is normal as well. Vascular/Lymphatic: No significant vascular findings are present. No enlarged abdominal or pelvic lymph nodes. Reproductive: Prostate is unremarkable. Other: No abdominal wall hernia or abnormality. No abdominopelvic ascites. Musculoskeletal: No acute or significant osseous findings. IMPRESSION: 1. There is a 4.7 mm stone in the proximal to mid left ureter resulting in mild hydronephrosis and perinephric stranding, explaining the patient's symptoms. 2. Mild colonic diverticulosis without diverticulitis. 3. No other significant abnormalities. Electronically Signed   By: Gerome Samavid  Williams III M.D   On: 01/25/2017 17:34    Assessment & Plan:   Luan PullingRickey was seen today for hypertension and diabetes.  Diagnoses and all orders for this visit:  Type II diabetes mellitus with complication, uncontrolled (HCC)- His A1c is at 6.4%.  His blood sugars are well controlled.  He was praised for his lifestyle modifications. -     Basic metabolic panel; Future -     Azilsartan-Chlorthalidone 40-25 MG TABS; Take 1 tablet by mouth daily. -     POCT glycosylated hemoglobin (Hb A1C) -      POCT Glucose (Device for Home Use)  Essential hypertension- His blood pressure is not adequately well controlled.  I will treat the vitamin D deficiency.  Will increase the dose of the thiazide diuretic and keep the ARB at the current dose. -     Basic metabolic panel; Future -     Azilsartan-Chlorthalidone 40-25 MG TABS; Take 1 tablet by mouth daily. -     VITAMIN D 25 Hydroxy (Vit-D Deficiency, Fractures); Future  Vitamin D deficiency -     Cholecalciferol 2000 units TABS; Take 2 tablets (4,000 Units total) by mouth daily.   I have discontinued Antonyo L. Schultes's Azilsartan-Chlorthalidone. I am also having him start on Azilsartan-Chlorthalidone and Cholecalciferol. Additionally, I am having him maintain his aspirin EC, pravastatin, zolpidem, and meloxicam.  Meds ordered this encounter  Medications  . Azilsartan-Chlorthalidone 40-25 MG TABS    Sig: Take 1 tablet by mouth daily.    Dispense:  90 tablet    Refill:  1  . Cholecalciferol 2000 units TABS    Sig: Take 2 tablets (4,000 Units total) by mouth daily.    Dispense:  180 tablet    Refill:  1  Follow-up: Return in about 6 months (around 05/16/2018).  Sanda Linger, MD

## 2017-11-13 NOTE — Patient Instructions (Signed)

## 2017-11-15 IMAGING — DX DG CHEST 2V
2 series · 2 of 2 positions shown · non-contrast
Comparison: Chest x-ray of January 05, 2014

CLINICAL DATA: Two weeks of cough.  History of asthma, diabetes.

EXAM:
CHEST  2 VIEW

[chest pa]
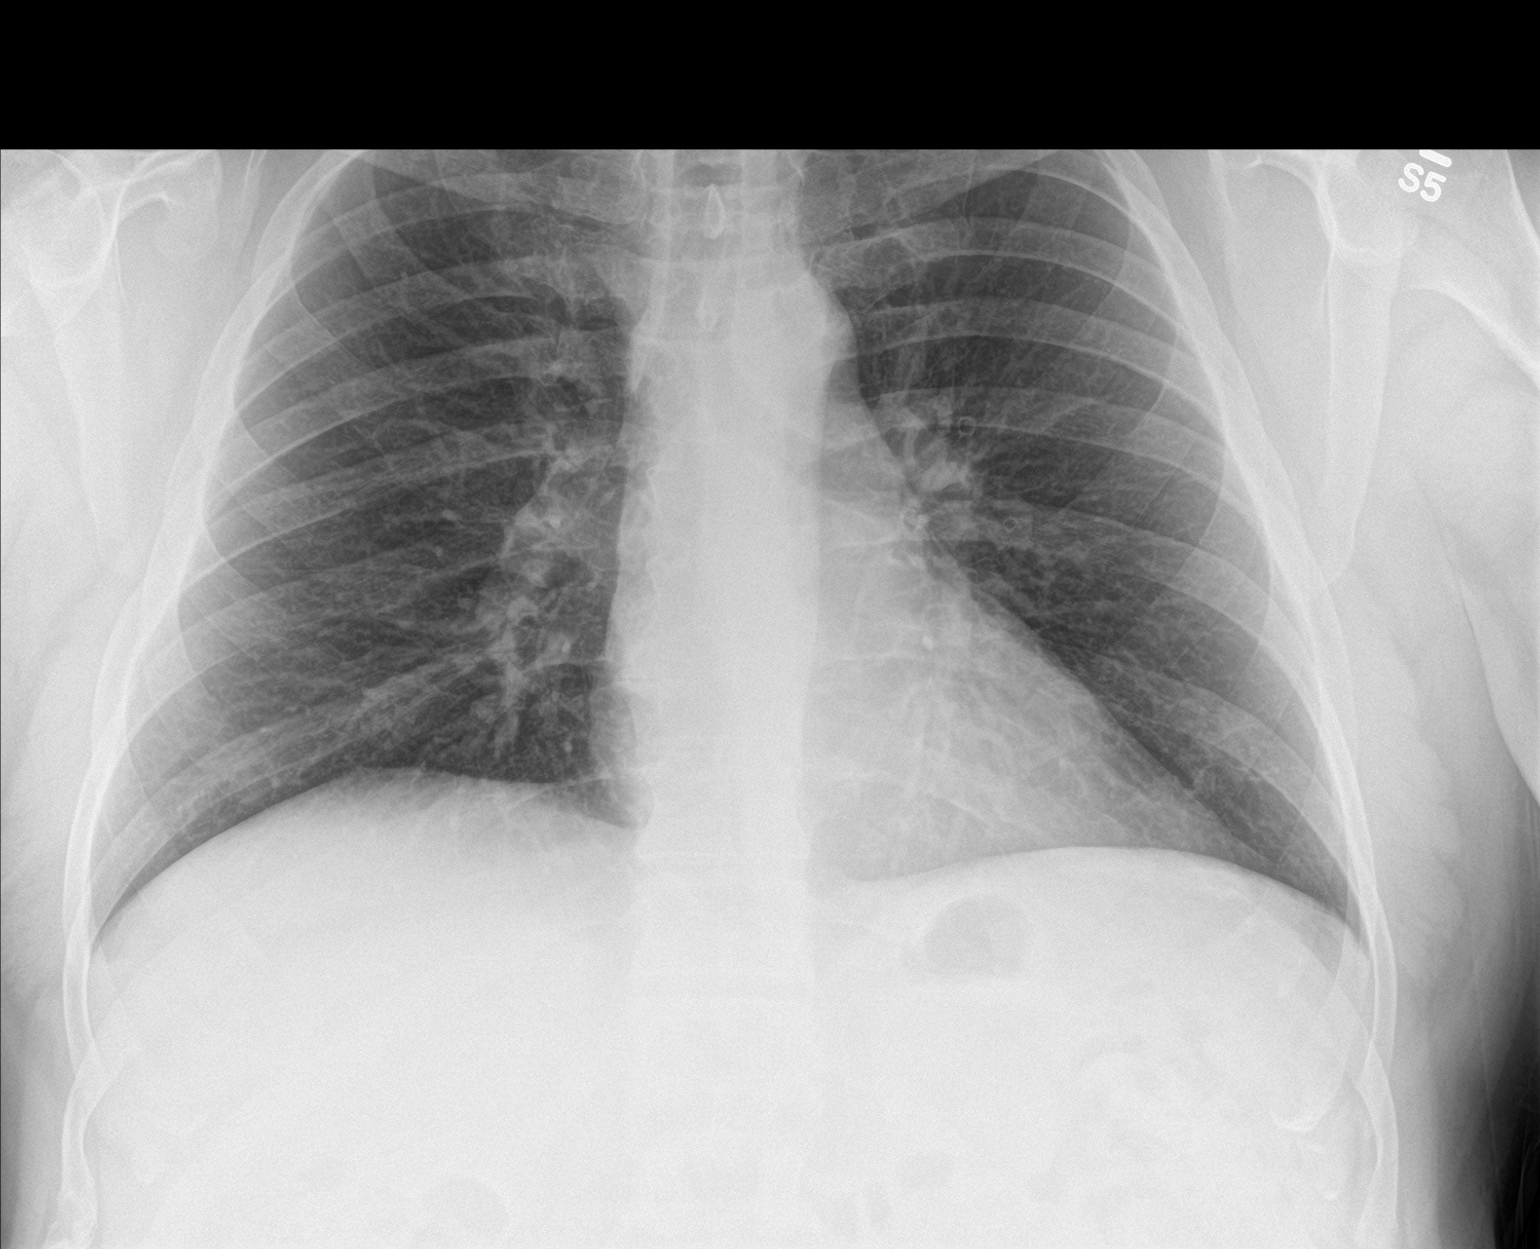

[chest lat]
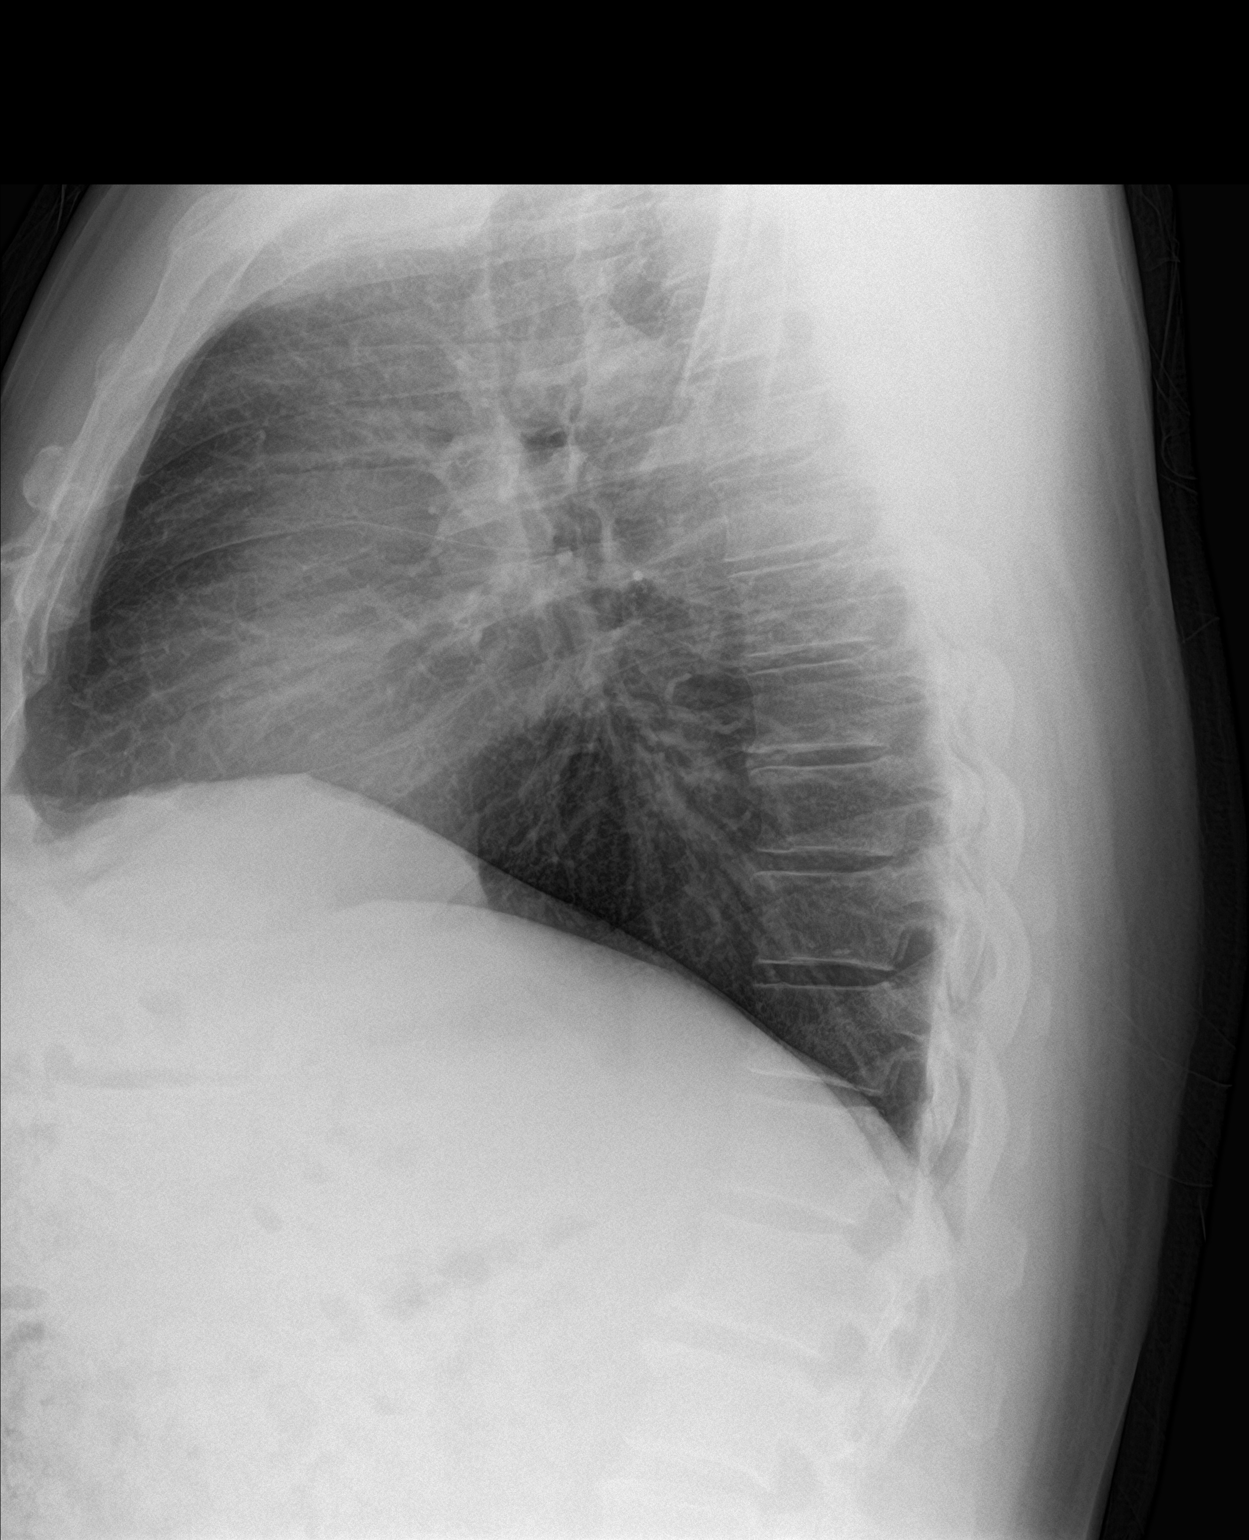

[2 of 2 positions shown; findings below may reference images not displayed]

FINDINGS: The lungs are adequately inflated. There is no focal infiltrate.
There is no pleural effusion. The heart and pulmonary vascularity
are normal. The mediastinum is normal in width. The bony thorax is
unremarkable.
IMPRESSION: There is no evidence of pneumonia nor other acute cardiopulmonary
abnormality.

## 2017-11-17 DIAGNOSIS — N528 Other male erectile dysfunction: Secondary | ICD-10-CM | POA: Diagnosis not present

## 2017-11-17 DIAGNOSIS — N200 Calculus of kidney: Secondary | ICD-10-CM | POA: Diagnosis not present

## 2017-11-17 DIAGNOSIS — E1165 Type 2 diabetes mellitus with hyperglycemia: Secondary | ICD-10-CM | POA: Diagnosis not present

## 2017-12-01 ENCOUNTER — Other Ambulatory Visit: Payer: Self-pay | Admitting: Internal Medicine

## 2018-01-08 DIAGNOSIS — E119 Type 2 diabetes mellitus without complications: Secondary | ICD-10-CM | POA: Diagnosis not present

## 2018-01-08 DIAGNOSIS — H524 Presbyopia: Secondary | ICD-10-CM | POA: Diagnosis not present

## 2018-01-08 LAB — HM DIABETES EYE EXAM

## 2018-01-14 ENCOUNTER — Encounter: Payer: Self-pay | Admitting: Internal Medicine

## 2018-01-15 ENCOUNTER — Other Ambulatory Visit: Payer: Self-pay | Admitting: Internal Medicine

## 2018-01-15 DIAGNOSIS — F418 Other specified anxiety disorders: Secondary | ICD-10-CM

## 2018-01-15 MED ORDER — SERTRALINE HCL 50 MG PO TABS: 50.0000 mg | ORAL_TABLET | Freq: Every day | ORAL | 0 refills | Status: DC

## 2018-02-23 ENCOUNTER — Other Ambulatory Visit: Payer: Self-pay | Admitting: Internal Medicine

## 2018-02-23 DIAGNOSIS — F418 Other specified anxiety disorders: Secondary | ICD-10-CM

## 2018-04-04 DIAGNOSIS — J111 Influenza due to unidentified influenza virus with other respiratory manifestations: Secondary | ICD-10-CM | POA: Diagnosis not present

## 2018-05-05 ENCOUNTER — Other Ambulatory Visit: Payer: Self-pay | Admitting: Internal Medicine

## 2018-05-05 DIAGNOSIS — I10 Essential (primary) hypertension: Secondary | ICD-10-CM

## 2018-05-05 DIAGNOSIS — IMO0002 Reserved for concepts with insufficient information to code with codable children: Secondary | ICD-10-CM

## 2018-05-05 DIAGNOSIS — E118 Type 2 diabetes mellitus with unspecified complications: Principal | ICD-10-CM

## 2018-05-05 DIAGNOSIS — E1165 Type 2 diabetes mellitus with hyperglycemia: Secondary | ICD-10-CM

## 2018-07-20 ENCOUNTER — Telehealth: Payer: Self-pay

## 2018-07-20 NOTE — Telephone Encounter (Signed)
Key: [id number]  PA has been approved.  Faxed same to [facility].

## 2018-07-29 ENCOUNTER — Other Ambulatory Visit: Payer: Self-pay | Admitting: Internal Medicine

## 2018-09-18 ENCOUNTER — Encounter: Payer: Self-pay | Admitting: Internal Medicine

## 2018-09-29 ENCOUNTER — Encounter: Payer: BLUE CROSS/BLUE SHIELD | Admitting: Internal Medicine

## 2018-10-19 ENCOUNTER — Other Ambulatory Visit: Payer: Self-pay | Admitting: Internal Medicine

## 2018-10-19 DIAGNOSIS — I10 Essential (primary) hypertension: Secondary | ICD-10-CM

## 2018-10-19 DIAGNOSIS — E1165 Type 2 diabetes mellitus with hyperglycemia: Secondary | ICD-10-CM

## 2018-10-19 DIAGNOSIS — IMO0002 Reserved for concepts with insufficient information to code with codable children: Secondary | ICD-10-CM

## 2018-10-19 MED ORDER — EDARBYCLOR 40-25 MG PO TABS: 1.0000 | ORAL_TABLET | Freq: Every day | ORAL | 0 refills | Status: DC

## 2018-10-22 ENCOUNTER — Other Ambulatory Visit: Payer: Self-pay | Admitting: Internal Medicine

## 2018-11-16 ENCOUNTER — Encounter: Payer: Self-pay | Admitting: Internal Medicine

## 2018-11-16 ENCOUNTER — Other Ambulatory Visit (INDEPENDENT_AMBULATORY_CARE_PROVIDER_SITE_OTHER): Payer: PRIVATE HEALTH INSURANCE

## 2018-11-16 ENCOUNTER — Ambulatory Visit (INDEPENDENT_AMBULATORY_CARE_PROVIDER_SITE_OTHER): Payer: PRIVATE HEALTH INSURANCE | Admitting: Internal Medicine

## 2018-11-16 ENCOUNTER — Other Ambulatory Visit: Payer: Self-pay

## 2018-11-16 VITALS — BP 160/94 | HR 76 | Temp 98.0°F | Resp 16 | Ht 68.5 in | Wt 243.0 lb

## 2018-11-16 DIAGNOSIS — E1165 Type 2 diabetes mellitus with hyperglycemia: Secondary | ICD-10-CM | POA: Diagnosis not present

## 2018-11-16 DIAGNOSIS — IMO0002 Reserved for concepts with insufficient information to code with codable children: Secondary | ICD-10-CM

## 2018-11-16 DIAGNOSIS — Z0001 Encounter for general adult medical examination with abnormal findings: Secondary | ICD-10-CM | POA: Diagnosis not present

## 2018-11-16 DIAGNOSIS — E785 Hyperlipidemia, unspecified: Secondary | ICD-10-CM

## 2018-11-16 DIAGNOSIS — I10 Essential (primary) hypertension: Secondary | ICD-10-CM | POA: Diagnosis not present

## 2018-11-16 DIAGNOSIS — E118 Type 2 diabetes mellitus with unspecified complications: Secondary | ICD-10-CM

## 2018-11-16 DIAGNOSIS — E559 Vitamin D deficiency, unspecified: Secondary | ICD-10-CM | POA: Diagnosis not present

## 2018-11-16 DIAGNOSIS — Z0000 Encounter for general adult medical examination without abnormal findings: Secondary | ICD-10-CM | POA: Diagnosis not present

## 2018-11-16 DIAGNOSIS — Z125 Encounter for screening for malignant neoplasm of prostate: Secondary | ICD-10-CM | POA: Diagnosis not present

## 2018-11-16 DIAGNOSIS — E781 Pure hyperglyceridemia: Secondary | ICD-10-CM

## 2018-11-16 LAB — URINALYSIS, ROUTINE W REFLEX MICROSCOPIC
Bilirubin Urine: NEGATIVE
Hgb urine dipstick: NEGATIVE
Ketones, ur: NEGATIVE
Leukocytes,Ua: NEGATIVE
Nitrite: NEGATIVE
RBC / HPF: NONE SEEN (ref 0–?)
Specific Gravity, Urine: 1.02 (ref 1.000–1.030)
Total Protein, Urine: NEGATIVE
Urine Glucose: NEGATIVE
Urobilinogen, UA: 0.2 (ref 0.0–1.0)
pH: 6 (ref 5.0–8.0)

## 2018-11-16 LAB — LIPID PANEL
Cholesterol: 192 mg/dL (ref 0–200)
HDL: 33.6 mg/dL — ABNORMAL LOW (ref >39.00)
Total CHOL/HDL Ratio: 6
Triglycerides: 498 mg/dL — ABNORMAL HIGH (ref 0.0–149.0)

## 2018-11-16 LAB — CBC WITH DIFFERENTIAL/PLATELET
Basophils Absolute: 0.1 10*3/uL (ref 0.0–0.1)
Basophils Relative: 0.9 % (ref 0.0–3.0)
Eosinophils Absolute: 0.1 10*3/uL (ref 0.0–0.7)
Eosinophils Relative: 1.2 % (ref 0.0–5.0)
HCT: 42.7 % (ref 39.0–52.0)
Hemoglobin: 15 g/dL (ref 13.0–17.0)
Lymphocytes Relative: 25.9 % (ref 12.0–46.0)
Lymphs Abs: 1.7 10*3/uL (ref 0.7–4.0)
MCHC: 35 g/dL (ref 30.0–36.0)
MCV: 93.6 fl (ref 78.0–100.0)
Monocytes Absolute: 0.6 10*3/uL (ref 0.1–1.0)
Monocytes Relative: 8.4 % (ref 3.0–12.0)
Neutro Abs: 4.3 10*3/uL (ref 1.4–7.7)
Neutrophils Relative %: 63.6 % (ref 43.0–77.0)
Platelets: 193 10*3/uL (ref 150.0–400.0)
RBC: 4.56 Mil/uL (ref 4.22–5.81)
RDW: 12.6 % (ref 11.5–15.5)
WBC: 6.7 10*3/uL (ref 4.0–10.5)

## 2018-11-16 LAB — BASIC METABOLIC PANEL
BUN: 21 mg/dL (ref 6–23)
CO2: 27 mEq/L (ref 19–32)
Calcium: 9.4 mg/dL (ref 8.4–10.5)
Chloride: 100 mEq/L (ref 96–112)
Creatinine, Ser: 1.27 mg/dL (ref 0.40–1.50)
GFR: 58.51 mL/min — ABNORMAL LOW (ref >60.00)
Glucose, Bld: 209 mg/dL — ABNORMAL HIGH (ref 70–99)
Potassium: 3.7 mEq/L (ref 3.5–5.1)
Sodium: 137 mEq/L (ref 135–145)

## 2018-11-16 LAB — HEPATIC FUNCTION PANEL
ALT: 28 U/L (ref 0–53)
AST: 22 U/L (ref 0–37)
Albumin: 4.5 g/dL (ref 3.5–5.2)
Alkaline Phosphatase: 44 U/L (ref 39–117)
Bilirubin, Direct: 0.1 mg/dL (ref 0.0–0.3)
Total Bilirubin: 0.3 mg/dL (ref 0.2–1.2)
Total Protein: 6.7 g/dL (ref 6.0–8.3)

## 2018-11-16 LAB — MICROALBUMIN / CREATININE URINE RATIO
Creatinine,U: 170.5 mg/dL
Microalb Creat Ratio: 0.5 mg/g (ref 0.0–30.0)
Microalb, Ur: 0.9 mg/dL (ref 0.0–1.9)

## 2018-11-16 LAB — HEMOGLOBIN A1C: Hgb A1c MFr Bld: 8 % — ABNORMAL HIGH (ref 4.6–6.5)

## 2018-11-16 LAB — LDL CHOLESTEROL, DIRECT: Direct LDL: 66 mg/dL

## 2018-11-16 MED ORDER — EDARBYCLOR 40-25 MG PO TABS: 1.0000 | ORAL_TABLET | Freq: Every day | ORAL | 0 refills | Status: DC

## 2018-11-16 NOTE — Patient Instructions (Signed)

## 2018-11-16 NOTE — Progress Notes (Signed)
Subjective:  Patient ID: Devin Roberts, male    DOB: [birth year]  Age: 58 y.o. MRN: [id number]  CC: Annual Exam, Hypertension, Hyperlipidemia, and Diabetes   HPI Devin Roberts presents for a CPX.  He complains of weight gain.  He tells me he is not been working on his lifestyle modifications.  He has also not been monitoring his blood pressure or his blood sugar.  He denies any recent episodes of CP, DOE, palpitations, edema, fatigue, or polys.  Outpatient Medications Prior to Visit  Medication Sig Dispense Refill   aspirin EC 81 MG tablet Take 1 tablet (81 mg total) by mouth daily. 90 tablet 3   Cholecalciferol 2000 units TABS Take 2 tablets (4,000 Units total) by mouth daily. 180 tablet 1   zolpidem (AMBIEN) 10 MG tablet TAKE 1 TABLET AT BEDTIME AS NEEDED FOR SLEEP. 90 tablet 1   Azilsartan-Chlorthalidone (EDARBYCLOR) 40-25 MG TABS Take 1 tablet by mouth daily. 30 tablet 0   meloxicam (MOBIC) 15 MG tablet TAKE 1 TABLET EACH DAY. 90 tablet 0   pravastatin (PRAVACHOL) 40 MG tablet TAKE ONE TABLET AT BEDTIME. 90 tablet 0   sertraline (ZOLOFT) 50 MG tablet TAKE 1 TABLET ONCE DAILY. 30 tablet 0   No facility-administered medications prior to visit.     ROS Review of Systems  Constitutional: Positive for unexpected weight change (wt gain). Negative for diaphoresis and fatigue.  HENT: Negative.   Eyes: Negative for visual disturbance.  Respiratory: Negative for cough, chest tightness, shortness of breath and wheezing.   Cardiovascular: Negative for chest pain, palpitations and leg swelling.  Gastrointestinal: Negative for abdominal pain, constipation, diarrhea, nausea and vomiting.  Endocrine: Negative.  Negative for polydipsia, polyphagia and polyuria.  Genitourinary: Negative.  Negative for difficulty urinating, dysuria, hematuria, penile swelling, scrotal swelling, testicular pain and urgency.  Musculoskeletal: Negative for arthralgias and myalgias.  Skin: Negative.     Neurological: Negative for dizziness, weakness, light-headedness and headaches.  Hematological: Negative for adenopathy. Does not bruise/bleed easily.  Psychiatric/Behavioral: Negative.     Objective:  BP (!) 160/94 (BP Location: Left Arm, Patient Position: Sitting, Cuff Size: Large)    Pulse 76    Temp 98 F (36.7 C) (Oral)    Resp 16    Ht 5' 8.5" (1.74 m)    Wt 243 lb (110.2 kg)    SpO2 95%    BMI 36.41 kg/m   BP Readings from Last 3 Encounters:  11/16/18 (!) 160/94  11/13/17 (!) 140/100  04/03/17 138/90    Wt Readings from Last 3 Encounters:  11/16/18 243 lb (110.2 kg)  11/13/17 233 lb 8 oz (105.9 kg)  04/03/17 238 lb (108 kg)    Physical Exam Vitals signs reviewed.  Constitutional:      General: He is not in acute distress.    Appearance: He is obese. He is not ill-appearing, toxic-appearing or diaphoretic.  HENT:     Nose: Nose normal.     Mouth/Throat:     Mouth: Mucous membranes are moist.  Eyes:     General: No scleral icterus.    Conjunctiva/sclera: Conjunctivae normal.  Neck:     Musculoskeletal: Normal range of motion. No neck rigidity or muscular tenderness.  Cardiovascular:     Rate and Rhythm: Normal rate and regular rhythm.     Heart sounds: No murmur.     Comments: EKG ----  Sinus  Rhythm  -Anterolateral ST-elevation -repolarization variant.   PROBABLY NORMAL- no change from the  prior EKG Pulmonary:     Effort: Pulmonary effort is normal. No respiratory distress.     Breath sounds: No stridor. No wheezing, rhonchi or rales.  Abdominal:     General: Abdomen is protuberant. Bowel sounds are normal.     Palpations: There is no hepatomegaly, splenomegaly or mass.     Tenderness: There is no abdominal tenderness. There is no guarding.  Musculoskeletal:     Right lower leg: No edema.     Left lower leg: No edema.  Lymphadenopathy:     Cervical: No cervical adenopathy.  Skin:    General: Skin is warm and dry.  Neurological:     General: No focal  deficit present.     Mental Status: He is alert and oriented to person, place, and time. Mental status is at baseline.  Psychiatric:        Mood and Affect: Mood normal.        Behavior: Behavior normal.     Lab Results  Component Value Date   WBC 6.7 11/16/2018   HGB 15.0 11/16/2018   HCT 42.7 11/16/2018   PLT 193.0 11/16/2018   GLUCOSE 209 (H) 11/16/2018   CHOL 192 11/16/2018   TRIG (H) 11/16/2018    498.0 Triglyceride is over 400; calculations on Lipids are invalid.   HDL 33.60 (L) 11/16/2018   LDLDIRECT 66.0 11/16/2018   LDLCALC 107 (H) 04/03/2017   ALT 28 11/16/2018   AST 22 11/16/2018   NA 137 11/16/2018   K 3.7 11/16/2018   CL 100 11/16/2018   CREATININE 1.27 11/16/2018   BUN 21 11/16/2018   CO2 27 11/16/2018   TSH 1.25 11/16/2018   PSA 1.12 11/16/2018   HGBA1C 8.0 (H) 11/16/2018   MICROALBUR 0.9 11/16/2018    Ct Renal Stone Study  Result Date: 01/25/2017 CLINICAL DATA:  Left flank pain.  No history of stones. EXAM: CT ABDOMEN AND PELVIS WITHOUT CONTRAST TECHNIQUE: Multidetector CT imaging of the abdomen and pelvis was performed following the standard protocol without IV contrast. COMPARISON:  None. FINDINGS: Lower chest: No acute abnormality. Hepatobiliary: No focal liver abnormality is seen. No gallstones, gallbladder wall thickening, or biliary dilatation. Pancreas: Unremarkable. No pancreatic ductal dilatation or surrounding inflammatory changes. Spleen: Normal in size without focal abnormality. Adrenals/Urinary Tract: Adrenal glands are normal. There is mild hydronephrosis and very nephric stranding on the left. This is due to a proximal left ureteral stone measuring up to 4.7 mm on coronal image 60. The kidneys, ureters, and bladder are otherwise unremarkable. Stomach/Bowel: The stomach and small bowel are normal. Mild colonic diverticulosis is seen without diverticulitis. The colon is otherwise normal. The appendix is normal as well. Vascular/Lymphatic: No  significant vascular findings are present. No enlarged abdominal or pelvic lymph nodes. Reproductive: Prostate is unremarkable. Other: No abdominal wall hernia or abnormality. No abdominopelvic ascites. Musculoskeletal: No acute or significant osseous findings. IMPRESSION: 1. There is a 4.7 mm stone in the proximal to mid left ureter resulting in mild hydronephrosis and perinephric stranding, explaining the patient's symptoms. 2. Mild colonic diverticulosis without diverticulitis. 3. No other significant abnormalities. Electronically Signed   By: Gerome Samavid  Williams III M.D   On: 01/25/2017 17:34    Assessment & Plan:   Luan PullingRickey was seen today for annual exam, hypertension, hyperlipidemia and diabetes.  Diagnoses and all orders for this visit:  Annual physical exam- Exam completed, labs reviewed, he refused a flu vaccine, colon cancer screening is up-to-date, patient education was given. -  Lipid panel; Future -     PSA; Future  Type II diabetes mellitus with complication, uncontrolled (HCC)- His A1c is up to 8.0%.  In addition of lifestyle modifications I have asked him to start taking metformin and an SGLT2 inhibitor. -     Basic metabolic panel; Future -     Urinalysis, Routine w reflex microscopic; Future -     Hemoglobin A1c; Future -     Microalbumin / creatinine urine ratio; Future -     HM Diabetes Foot Exam -     Azilsartan-Chlorthalidone (EDARBYCLOR) 40-25 MG TABS; Take 1 tablet by mouth daily. -     Dapagliflozin-metFORMIN HCl ER (XIGDUO XR) 12-998 MG TB24; Take 1 tablet by mouth daily.  Essential hypertension- His blood pressure is not adequately well controlled.  I have asked him to maintain compliance with the ARB and thiazide diuretic.  I have asked him to improve his lifestyle modifications.  He will start taking an SGLT2 inhibitor so I anticipate this will help him reach his blood pressure goal as well. -     Basic metabolic panel; Future -     CBC with Differential/Platelet;  Future -     TSH; Future -     Urinalysis, Routine w reflex microscopic; Future -     EKG 12-Lead -     Azilsartan-Chlorthalidone (EDARBYCLOR) 40-25 MG TABS; Take 1 tablet by mouth daily.  Vitamin D deficiency -     VITAMIN D 25 Hydroxy (Vit-D Deficiency, Fractures); Future  Hyperlipidemia with target LDL less than 100- He has achieved his LDL goal is doing well on the statin. -     TSH; Future -     Hepatic function panel; Future -     pravastatin (PRAVACHOL) 40 MG tablet; Take 1 tablet (40 mg total) by mouth at bedtime.  Pure hypertriglyceridemia- I have asked him to start taking icosapent ethyl to reduce the risk of complications like pancreatitis and for CV risk reduction. -     Icosapent Ethyl (VASCEPA) 1 g CAPS; Take 2 capsules (2 g total) by mouth 2 (two) times daily.   I have discontinued Saliou L. Ellwanger's meloxicam and sertraline. I have also changed his pravastatin. Additionally, I am having him start on Vascepa and Xigduo XR. Lastly, I am having him maintain his aspirin EC, Cholecalciferol, zolpidem, and Edarbyclor.  Meds ordered this encounter  Medications   Azilsartan-Chlorthalidone (EDARBYCLOR) 40-25 MG TABS    Sig: Take 1 tablet by mouth daily.    Dispense:  90 tablet    Refill:  0   Icosapent Ethyl (VASCEPA) 1 g CAPS    Sig: Take 2 capsules (2 g total) by mouth 2 (two) times daily.    Dispense:  360 capsule    Refill:  1   pravastatin (PRAVACHOL) 40 MG tablet    Sig: Take 1 tablet (40 mg total) by mouth at bedtime.    Dispense:  90 tablet    Refill:  1   Dapagliflozin-metFORMIN HCl ER (XIGDUO XR) 12-998 MG TB24    Sig: Take 1 tablet by mouth daily.    Dispense:  90 tablet    Refill:  0     Follow-up: Return in about 3 months (around 02/16/2019).  Sanda Lingerhomas Dianelys Scinto, MD

## 2018-11-17 ENCOUNTER — Other Ambulatory Visit: Payer: Self-pay | Admitting: Internal Medicine

## 2018-11-17 ENCOUNTER — Encounter: Payer: Self-pay | Admitting: Internal Medicine

## 2018-11-17 DIAGNOSIS — E1165 Type 2 diabetes mellitus with hyperglycemia: Secondary | ICD-10-CM

## 2018-11-17 DIAGNOSIS — IMO0002 Reserved for concepts with insufficient information to code with codable children: Secondary | ICD-10-CM

## 2018-11-17 DIAGNOSIS — E781 Pure hyperglyceridemia: Secondary | ICD-10-CM | POA: Insufficient documentation

## 2018-11-17 LAB — TSH: TSH: 1.25 u[IU]/mL (ref 0.35–4.50)

## 2018-11-17 LAB — PSA: PSA: 1.12 ng/mL (ref 0.10–4.00)

## 2018-11-17 LAB — VITAMIN D 25 HYDROXY (VIT D DEFICIENCY, FRACTURES): VITD: 54.81 ng/mL (ref 30.00–100.00)

## 2018-11-17 MED ORDER — PRAVASTATIN SODIUM 40 MG PO TABS: 40.0000 mg | ORAL_TABLET | Freq: Every day | ORAL | 1 refills | Status: DC

## 2018-11-17 MED ORDER — VASCEPA 1 G PO CAPS: 2.0000 | ORAL_CAPSULE | Freq: Two times a day (BID) | ORAL | 1 refills | Status: DC

## 2018-11-17 MED ORDER — XIGDUO XR 10-1000 MG PO TB24: 1.0000 | ORAL_TABLET | Freq: Every day | ORAL | 0 refills | Status: DC

## 2018-11-20 ENCOUNTER — Telehealth: Payer: Self-pay

## 2018-11-20 NOTE — Telephone Encounter (Signed)
PA for Vascepa = Key: [id number] PA for [facility] = Key: [id number]

## 2018-11-20 NOTE — Telephone Encounter (Signed)
Both PA's have been approved and pharmacy has been informed of same.

## 2018-11-20 NOTE — Telephone Encounter (Signed)
PA for [place].   Started PA on cover my meds. Message stated that pt was not found.   Called insurance. Agent requested to call me back at my direct office number while she generates a key.

## 2018-12-06 ENCOUNTER — Encounter: Payer: Self-pay | Admitting: Internal Medicine

## 2018-12-07 ENCOUNTER — Other Ambulatory Visit: Payer: Self-pay | Admitting: Internal Medicine

## 2018-12-07 DIAGNOSIS — K5904 Chronic idiopathic constipation: Secondary | ICD-10-CM

## 2018-12-07 MED ORDER — TRULANCE 3 MG PO TABS: 1.0000 | ORAL_TABLET | Freq: Every day | ORAL | 1 refills | Status: DC

## 2018-12-08 NOTE — Telephone Encounter (Signed)
Key: [id number]

## 2018-12-14 ENCOUNTER — Other Ambulatory Visit: Payer: Self-pay | Admitting: Internal Medicine

## 2018-12-18 ENCOUNTER — Other Ambulatory Visit: Payer: Self-pay | Admitting: Internal Medicine

## 2019-02-08 ENCOUNTER — Other Ambulatory Visit: Payer: Self-pay | Admitting: Internal Medicine

## 2019-02-08 DIAGNOSIS — E1165 Type 2 diabetes mellitus with hyperglycemia: Secondary | ICD-10-CM

## 2019-02-08 DIAGNOSIS — IMO0002 Reserved for concepts with insufficient information to code with codable children: Secondary | ICD-10-CM

## 2019-02-08 DIAGNOSIS — I10 Essential (primary) hypertension: Secondary | ICD-10-CM

## 2019-02-11 LAB — HM DIABETES EYE EXAM

## 2019-02-25 ENCOUNTER — Other Ambulatory Visit: Payer: Self-pay

## 2019-02-25 ENCOUNTER — Other Ambulatory Visit (INDEPENDENT_AMBULATORY_CARE_PROVIDER_SITE_OTHER): Payer: PRIVATE HEALTH INSURANCE

## 2019-02-25 ENCOUNTER — Encounter: Payer: Self-pay | Admitting: Internal Medicine

## 2019-02-25 ENCOUNTER — Ambulatory Visit (INDEPENDENT_AMBULATORY_CARE_PROVIDER_SITE_OTHER): Payer: PRIVATE HEALTH INSURANCE | Admitting: Internal Medicine

## 2019-02-25 VITALS — BP 120/84 | HR 63 | Temp 98.1°F | Ht 68.5 in | Wt 220.0 lb

## 2019-02-25 DIAGNOSIS — Z114 Encounter for screening for human immunodeficiency virus [HIV]: Secondary | ICD-10-CM

## 2019-02-25 DIAGNOSIS — IMO0002 Reserved for concepts with insufficient information to code with codable children: Secondary | ICD-10-CM

## 2019-02-25 DIAGNOSIS — I10 Essential (primary) hypertension: Secondary | ICD-10-CM

## 2019-02-25 DIAGNOSIS — E118 Type 2 diabetes mellitus with unspecified complications: Secondary | ICD-10-CM

## 2019-02-25 DIAGNOSIS — E781 Pure hyperglyceridemia: Secondary | ICD-10-CM

## 2019-02-25 DIAGNOSIS — E1165 Type 2 diabetes mellitus with hyperglycemia: Secondary | ICD-10-CM

## 2019-02-25 DIAGNOSIS — Z1159 Encounter for screening for other viral diseases: Secondary | ICD-10-CM

## 2019-02-25 LAB — BASIC METABOLIC PANEL
BUN: 21 mg/dL (ref 6–23)
CO2: 28 mEq/L (ref 19–32)
Calcium: 9.8 mg/dL (ref 8.4–10.5)
Chloride: 103 mEq/L (ref 96–112)
Creatinine, Ser: 1.2 mg/dL (ref 0.40–1.50)
GFR: 62.41 mL/min (ref >60.00)
Glucose, Bld: 100 mg/dL — ABNORMAL HIGH (ref 70–99)
Potassium: 4.1 mEq/L (ref 3.5–5.1)
Sodium: 140 mEq/L (ref 135–145)

## 2019-02-25 LAB — HEMOGLOBIN A1C: Hgb A1c MFr Bld: 5.9 % (ref 4.6–6.5)

## 2019-02-25 LAB — TRIGLYCERIDES: Triglycerides: 58 mg/dL (ref 0.0–149.0)

## 2019-02-25 MED ORDER — EDARBYCLOR 40-12.5 MG PO TABS: 1.0000 | ORAL_TABLET | Freq: Every day | ORAL | 1 refills | Status: DC

## 2019-02-25 NOTE — Progress Notes (Signed)
Faxing request for eye exam notes to [facility]

## 2019-02-25 NOTE — Progress Notes (Signed)
Subjective:  Patient ID: Devin Roberts, male    DOB: [birth year]  Age: 58 y.o. MRN: [id number]  CC: Hypertension, Hyperlipidemia, and Diabetes  This visit occurred during the SARS-CoV-2 public health emergency.  Safety protocols were in place, including screening questions prior to the visit, additional usage of staff PPE, and extensive cleaning of exam room while observing appropriate contact time as indicated for disinfecting solutions.    HPI Devin Roberts presents for f/up- He feels well today and offers no complaints.  He has intentionally lost 23 pounds over the last 3 months.  He denies dizziness, lightheadedness, polyuria, edema, or fatigue.  Outpatient Medications Prior to Visit  Medication Sig Dispense Refill  . aspirin EC 81 MG tablet Take 1 tablet (81 mg total) by mouth daily. 90 tablet 3  . Cholecalciferol 2000 units TABS Take 2 tablets (4,000 Units total) by mouth daily. 180 tablet 1  . Icosapent Ethyl (VASCEPA) 1 g CAPS Take 2 capsules (2 g total) by mouth 2 (two) times daily. 360 capsule 1  . meloxicam (MOBIC) 15 MG tablet TAKE 1 TABLET EACH DAY. 90 tablet 0  . Plecanatide (TRULANCE) 3 MG TABS Take 1 tablet by mouth daily. 90 tablet 1  . pravastatin (PRAVACHOL) 40 MG tablet Take 1 tablet (40 mg total) by mouth at bedtime. 90 tablet 1  . zolpidem (AMBIEN) 10 MG tablet TAKE 1 TABLET AT BEDTIME AS NEEDED FOR SLEEP. 30 tablet 3  . Dapagliflozin-metFORMIN HCl ER (XIGDUO XR) 12-998 MG TB24 Take 1 tablet by mouth daily. 90 tablet 0  . EDARBYCLOR 40-25 MG TABS TAKE 1 TABLET ONCE DAILY. 90 tablet 0   No facility-administered medications prior to visit.     ROS Review of Systems  Constitutional: Negative for diaphoresis, fatigue and unexpected weight change.  HENT: Negative.   Eyes: Negative for visual disturbance.  Respiratory: Negative for cough, chest tightness, shortness of breath and wheezing.   Cardiovascular: Negative for chest pain, palpitations and leg swelling.   Gastrointestinal: Negative for abdominal pain, diarrhea, nausea and vomiting.  Endocrine: Negative for cold intolerance and heat intolerance.  Genitourinary: Negative.  Negative for difficulty urinating and dysuria.  Musculoskeletal: Negative.  Negative for arthralgias and myalgias.  Skin: Negative.  Negative for color change.  Neurological: Negative for dizziness, weakness and headaches.  Hematological: Negative for adenopathy. Does not bruise/bleed easily.  Psychiatric/Behavioral: Negative.     Objective:  BP 120/84 (BP Location: Left Arm, Patient Position: Sitting, Cuff Size: Normal)   Pulse 63   Temp 98.1 F (36.7 C) (Oral)   Ht 5' 8.5" (1.74 m)   Wt 220 lb (99.8 kg)   SpO2 96%   BMI 32.96 kg/m   BP Readings from Last 3 Encounters:  02/25/19 120/84  11/16/18 (!) 160/94  11/13/17 (!) 140/100    Wt Readings from Last 3 Encounters:  02/25/19 220 lb (99.8 kg)  11/16/18 243 lb (110.2 kg)  11/13/17 233 lb 8 oz (105.9 kg)    Physical Exam Vitals signs reviewed.  Constitutional:      Appearance: He is obese. He is not ill-appearing.  HENT:     Nose: Nose normal.     Mouth/Throat:     Mouth: Mucous membranes are moist.  Eyes:     General: No scleral icterus.    Conjunctiva/sclera: Conjunctivae normal.  Neck:     Musculoskeletal: Neck supple.  Cardiovascular:     Rate and Rhythm: Normal rate and regular rhythm.     Heart  sounds: No murmur.  Pulmonary:     Effort: Pulmonary effort is normal.     Breath sounds: No stridor. No wheezing, rhonchi or rales.  Abdominal:     General: Abdomen is protuberant. Bowel sounds are normal. There is no distension.     Palpations: Abdomen is soft. There is no hepatomegaly or splenomegaly.     Tenderness: There is no abdominal tenderness. There is no guarding or rebound.  Musculoskeletal: Normal range of motion.     Right lower leg: No edema.     Left lower leg: No edema.  Lymphadenopathy:     Cervical: No cervical adenopathy.   Skin:    General: Skin is warm and dry.  Neurological:     General: No focal deficit present.     Mental Status: He is alert.  Psychiatric:        Mood and Affect: Mood normal.        Behavior: Behavior normal.     Lab Results  Component Value Date   WBC 6.7 11/16/2018   HGB 15.0 11/16/2018   HCT 42.7 11/16/2018   PLT 193.0 11/16/2018   GLUCOSE 100 (H) 02/25/2019   CHOL 192 11/16/2018   TRIG 58.0 02/25/2019   HDL 33.60 (L) 11/16/2018   LDLDIRECT 66.0 11/16/2018   LDLCALC 107 (H) 04/03/2017   ALT 28 11/16/2018   AST 22 11/16/2018   NA 140 02/25/2019   K 4.1 02/25/2019   CL 103 02/25/2019   CREATININE 1.20 02/25/2019   BUN 21 02/25/2019   CO2 28 02/25/2019   TSH 1.25 11/16/2018   PSA 1.12 11/16/2018   HGBA1C 5.9 02/25/2019   MICROALBUR 0.9 11/16/2018    Ct Renal Stone Study  Result Date: 01/25/2017 CLINICAL DATA:  Left flank pain.  No history of stones. EXAM: CT ABDOMEN AND PELVIS WITHOUT CONTRAST TECHNIQUE: Multidetector CT imaging of the abdomen and pelvis was performed following the standard protocol without IV contrast. COMPARISON:  None. FINDINGS: Lower chest: No acute abnormality. Hepatobiliary: No focal liver abnormality is seen. No gallstones, gallbladder wall thickening, or biliary dilatation. Pancreas: Unremarkable. No pancreatic ductal dilatation or surrounding inflammatory changes. Spleen: Normal in size without focal abnormality. Adrenals/Urinary Tract: Adrenal glands are normal. There is mild hydronephrosis and very nephric stranding on the left. This is due to a proximal left ureteral stone measuring up to 4.7 mm on coronal image 60. The kidneys, ureters, and bladder are otherwise unremarkable. Stomach/Bowel: The stomach and small bowel are normal. Mild colonic diverticulosis is seen without diverticulitis. The colon is otherwise normal. The appendix is normal as well. Vascular/Lymphatic: No significant vascular findings are present. No enlarged abdominal or  pelvic lymph nodes. Reproductive: Prostate is unremarkable. Other: No abdominal wall hernia or abnormality. No abdominopelvic ascites. Musculoskeletal: No acute or significant osseous findings. IMPRESSION: 1. There is a 4.7 mm stone in the proximal to mid left ureter resulting in mild hydronephrosis and perinephric stranding, explaining the patient's symptoms. 2. Mild colonic diverticulosis without diverticulitis. 3. No other significant abnormalities. Electronically Signed   By: Dorise Bullion III M.D   On: 01/25/2017 17:34    Assessment & Plan:   Bharat was seen today for hypertension, hyperlipidemia and diabetes.  Diagnoses and all orders for this visit:  Type II diabetes mellitus with complication, uncontrolled ([place])- His A1c is down to 5.9%.  I recommended that he stop taking Xigduo and to continue monitoring his blood sugars. -     Hemoglobin A1c; Future -  Basic metabolic panel; Future -     Azilsartan-Chlorthalidone (EDARBYCLOR) 40-12.5 MG TABS; Take 1 tablet by mouth daily.  Essential hypertension- His blood pressure is slightly over controlled.  I recommended that he decrease the dosage of chlorthalidone and stay on the current dose of azilsartan. -     Basic metabolic panel; Future -     Azilsartan-Chlorthalidone (EDARBYCLOR) 40-12.5 MG TABS; Take 1 tablet by mouth daily.  Pure hypertriglyceridemia- Marked improvement noted.  He was praised for his lifestyle modifications. -     Triglycerides; Future  Encounter for screening for HIV -     HIV antibody (Reflex); Future  Need for hepatitis C screening test -     Hepatitis C antibody; Future   I have discontinued Jacquan L. Cihlar's Xigduo XR and [employer]Ed[employer]or. I am also having him start on Edarbyclor. Additionally, I am having him maintain his aspirin EC, Cholecalciferol, Vascepa, pravastatin, Trulance, zolpidem, and meloxicam.  Meds ordered this encounter  Medications  . Azilsartan-Chlorthalidone (EDARBYCLOR) 40-12.5 MG TABS     Sig: Take 1 tablet by mouth daily.    Dispense:  90 tablet    Refill:  1     Follow-up: Return in about 4 months (around 06/26/2019).  Sanda Lingerhomas Tabbatha Bordelon, MD

## 2019-02-25 NOTE — Patient Instructions (Signed)

## 2019-02-26 LAB — HEPATITIS C ANTIBODY
Hepatitis C Ab: NONREACTIVE
SIGNAL TO CUT-OFF: 0.01 (ref <1.00)

## 2019-02-26 LAB — HIV ANTIBODY (ROUTINE TESTING W REFLEX): HIV 1&2 Ab, 4th Generation: NONREACTIVE

## 2019-04-07 ENCOUNTER — Encounter: Payer: Self-pay | Admitting: Internal Medicine

## 2019-04-15 ENCOUNTER — Other Ambulatory Visit: Payer: Self-pay | Admitting: Internal Medicine

## 2019-05-24 ENCOUNTER — Other Ambulatory Visit: Payer: Self-pay | Admitting: Internal Medicine

## 2019-06-22 ENCOUNTER — Telehealth: Payer: Self-pay

## 2019-06-22 NOTE — Telephone Encounter (Signed)
Key: [id number]

## 2019-07-13 ENCOUNTER — Other Ambulatory Visit: Payer: Self-pay | Admitting: Internal Medicine

## 2019-07-28 ENCOUNTER — Other Ambulatory Visit: Payer: Self-pay | Admitting: Internal Medicine

## 2019-07-28 DIAGNOSIS — E785 Hyperlipidemia, unspecified: Secondary | ICD-10-CM

## 2019-09-01 ENCOUNTER — Other Ambulatory Visit: Payer: Self-pay | Admitting: Internal Medicine

## 2019-09-01 DIAGNOSIS — E1165 Type 2 diabetes mellitus with hyperglycemia: Secondary | ICD-10-CM

## 2019-09-01 DIAGNOSIS — IMO0002 Reserved for concepts with insufficient information to code with codable children: Secondary | ICD-10-CM

## 2019-09-01 DIAGNOSIS — I10 Essential (primary) hypertension: Secondary | ICD-10-CM

## 2019-09-28 ENCOUNTER — Other Ambulatory Visit: Payer: Self-pay | Admitting: Internal Medicine

## 2019-09-28 DIAGNOSIS — E1165 Type 2 diabetes mellitus with hyperglycemia: Secondary | ICD-10-CM

## 2019-09-28 DIAGNOSIS — IMO0002 Reserved for concepts with insufficient information to code with codable children: Secondary | ICD-10-CM

## 2019-09-28 DIAGNOSIS — I10 Essential (primary) hypertension: Secondary | ICD-10-CM

## 2019-10-27 ENCOUNTER — Other Ambulatory Visit: Payer: Self-pay | Admitting: Internal Medicine

## 2019-10-27 DIAGNOSIS — E785 Hyperlipidemia, unspecified: Secondary | ICD-10-CM

## 2019-11-01 ENCOUNTER — Telehealth: Payer: Self-pay

## 2019-11-01 NOTE — Telephone Encounter (Signed)
Vascepa Key: [id number]  Devin Roberts KeyLynelle Doctor

## 2019-11-11 ENCOUNTER — Telehealth: Payer: Self-pay

## 2019-11-11 NOTE — Telephone Encounter (Signed)
Key: [id number]

## 2019-11-13 ENCOUNTER — Other Ambulatory Visit: Payer: Self-pay | Admitting: Internal Medicine

## 2019-12-27 ENCOUNTER — Other Ambulatory Visit: Payer: Self-pay | Admitting: Internal Medicine

## 2019-12-27 DIAGNOSIS — IMO0002 Reserved for concepts with insufficient information to code with codable children: Secondary | ICD-10-CM

## 2019-12-27 DIAGNOSIS — E1165 Type 2 diabetes mellitus with hyperglycemia: Secondary | ICD-10-CM

## 2019-12-27 DIAGNOSIS — I10 Essential (primary) hypertension: Secondary | ICD-10-CM

## 2020-01-18 ENCOUNTER — Encounter: Payer: Self-pay | Admitting: Internal Medicine

## 2020-01-18 ENCOUNTER — Ambulatory Visit (INDEPENDENT_AMBULATORY_CARE_PROVIDER_SITE_OTHER): Payer: PRIVATE HEALTH INSURANCE | Admitting: Internal Medicine

## 2020-01-18 ENCOUNTER — Other Ambulatory Visit: Payer: Self-pay

## 2020-01-18 VITALS — BP 162/100 | HR 70 | Temp 98.2°F | Resp 16 | Ht 68.5 in | Wt 223.0 lb

## 2020-01-18 DIAGNOSIS — E1165 Type 2 diabetes mellitus with hyperglycemia: Secondary | ICD-10-CM

## 2020-01-18 DIAGNOSIS — R0683 Snoring: Secondary | ICD-10-CM

## 2020-01-18 DIAGNOSIS — E559 Vitamin D deficiency, unspecified: Secondary | ICD-10-CM | POA: Diagnosis not present

## 2020-01-18 DIAGNOSIS — Z0000 Encounter for general adult medical examination without abnormal findings: Secondary | ICD-10-CM | POA: Diagnosis not present

## 2020-01-18 DIAGNOSIS — E785 Hyperlipidemia, unspecified: Secondary | ICD-10-CM

## 2020-01-18 DIAGNOSIS — Z23 Encounter for immunization: Secondary | ICD-10-CM

## 2020-01-18 DIAGNOSIS — E118 Type 2 diabetes mellitus with unspecified complications: Secondary | ICD-10-CM

## 2020-01-18 DIAGNOSIS — I10 Essential (primary) hypertension: Secondary | ICD-10-CM | POA: Diagnosis not present

## 2020-01-18 DIAGNOSIS — IMO0002 Reserved for concepts with insufficient information to code with codable children: Secondary | ICD-10-CM

## 2020-01-18 DIAGNOSIS — H6191 Disorder of right external ear, unspecified: Secondary | ICD-10-CM

## 2020-01-18 LAB — MICROALBUMIN / CREATININE URINE RATIO
Creatinine,U: 176.4 mg/dL
Microalb Creat Ratio: 0.8 mg/g (ref 0.0–30.0)
Microalb, Ur: 1.3 mg/dL (ref 0.0–1.9)

## 2020-01-18 LAB — BASIC METABOLIC PANEL
BUN: 14 mg/dL (ref 6–23)
CO2: 31 mEq/L (ref 19–32)
Calcium: 9.7 mg/dL (ref 8.4–10.5)
Chloride: 101 mEq/L (ref 96–112)
Creatinine, Ser: 1.13 mg/dL (ref 0.40–1.50)
GFR: 71.96 mL/min (ref >60.00)
Glucose, Bld: 124 mg/dL — ABNORMAL HIGH (ref 70–99)
Potassium: 3.9 mEq/L (ref 3.5–5.1)
Sodium: 140 mEq/L (ref 135–145)

## 2020-01-18 LAB — LIPID PANEL
Cholesterol: 165 mg/dL (ref 0–200)
HDL: 47.1 mg/dL (ref >39.00)
LDL Cholesterol: 102 mg/dL — ABNORMAL HIGH (ref 0–99)
NonHDL: 117.9
Total CHOL/HDL Ratio: 4
Triglycerides: 78 mg/dL (ref 0.0–149.0)
VLDL: 15.6 mg/dL (ref 0.0–40.0)

## 2020-01-18 LAB — PSA: PSA: 1.13 ng/mL (ref 0.10–4.00)

## 2020-01-18 LAB — VITAMIN D 25 HYDROXY (VIT D DEFICIENCY, FRACTURES): VITD: 27.96 ng/mL — ABNORMAL LOW (ref 30.00–100.00)

## 2020-01-18 LAB — HEPATIC FUNCTION PANEL
ALT: 23 U/L (ref 0–53)
AST: 24 U/L (ref 0–37)
Albumin: 4.6 g/dL (ref 3.5–5.2)
Alkaline Phosphatase: 42 U/L (ref 39–117)
Bilirubin, Direct: 0.1 mg/dL (ref 0.0–0.3)
Total Bilirubin: 0.7 mg/dL (ref 0.2–1.2)
Total Protein: 6.7 g/dL (ref 6.0–8.3)

## 2020-01-18 LAB — CBC WITH DIFFERENTIAL/PLATELET
Basophils Absolute: 0.1 10*3/uL (ref 0.0–0.1)
Basophils Relative: 1.3 % (ref 0.0–3.0)
Eosinophils Absolute: 0.1 10*3/uL (ref 0.0–0.7)
Eosinophils Relative: 1.3 % (ref 0.0–5.0)
HCT: 46.3 % (ref 39.0–52.0)
Hemoglobin: 16.1 g/dL (ref 13.0–17.0)
Lymphocytes Relative: 23.7 % (ref 12.0–46.0)
Lymphs Abs: 1.4 10*3/uL (ref 0.7–4.0)
MCHC: 34.8 g/dL (ref 30.0–36.0)
MCV: 93.7 fl (ref 78.0–100.0)
Monocytes Absolute: 0.5 10*3/uL (ref 0.1–1.0)
Monocytes Relative: 8.4 % (ref 3.0–12.0)
Neutro Abs: 3.8 10*3/uL (ref 1.4–7.7)
Neutrophils Relative %: 65.3 % (ref 43.0–77.0)
Platelets: 187 10*3/uL (ref 150.0–400.0)
RBC: 4.94 Mil/uL (ref 4.22–5.81)
RDW: 12.5 % (ref 11.5–15.5)
WBC: 5.9 10*3/uL (ref 4.0–10.5)

## 2020-01-18 LAB — URINALYSIS, ROUTINE W REFLEX MICROSCOPIC
Bilirubin Urine: NEGATIVE
Hgb urine dipstick: NEGATIVE
Ketones, ur: NEGATIVE
Leukocytes,Ua: NEGATIVE
Nitrite: NEGATIVE
RBC / HPF: NONE SEEN (ref 0–?)
Specific Gravity, Urine: 1.02 (ref 1.000–1.030)
Total Protein, Urine: NEGATIVE
Urine Glucose: NEGATIVE
Urobilinogen, UA: 0.2 (ref 0.0–1.0)
WBC, UA: NONE SEEN (ref 0–?)
pH: 6 (ref 5.0–8.0)

## 2020-01-18 LAB — TSH: TSH: 0.93 u[IU]/mL (ref 0.35–4.50)

## 2020-01-18 LAB — HEMOGLOBIN A1C: Hgb A1c MFr Bld: 6.2 % (ref 4.6–6.5)

## 2020-01-18 MED ORDER — PRAVASTATIN SODIUM 40 MG PO TABS: 40.0000 mg | ORAL_TABLET | Freq: Every day | ORAL | 1 refills | Status: DC

## 2020-01-18 MED ORDER — EDARBYCLOR 40-25 MG PO TABS: 1.0000 | ORAL_TABLET | Freq: Every day | ORAL | 0 refills | Status: DC

## 2020-01-18 MED ORDER — NEBIVOLOL HCL 5 MG PO TABS: 5.0000 mg | ORAL_TABLET | Freq: Every day | ORAL | 0 refills | Status: DC

## 2020-01-18 MED ORDER — CHOLECALCIFEROL 50 MCG (2000 UT) PO TABS: 2.0000 | ORAL_TABLET | Freq: Every day | ORAL | 1 refills | Status: DC

## 2020-01-18 NOTE — Patient Instructions (Signed)

## 2020-01-18 NOTE — Progress Notes (Signed)
Subjective:  Patient ID: Devin Roberts, male    DOB: 11/26/1960  Age: 59 y.o. MRN: [id number]  CC: Hypertension, Annual Exam, Hyperlipidemia, and Diabetes  This visit occurred during the SARS-CoV-2 public health emergency.  Safety protocols were in place, including screening questions prior to the visit, additional usage of staff PPE, and extensive cleaning of exam room while observing appropriate contact time as indicated for disinfecting solutions.    HPI Devin Roberts presents for a CPX.  1.  He tells me that he runs on a treadmill several times a week.  He does not experience CP, DOE, palpitations, edema, or fatigue.  He has not been monitoring his blood pressure.  He denies any recent episodes of headache.  2.  He tells me his blood sugars have been well controlled and he denies polys.  3 he complains of a crusty lesion on the outer part of his right ear for several months.  Outpatient Medications Prior to Visit  Medication Sig Dispense Refill  . aspirin EC 81 MG tablet Take 1 tablet (81 mg total) by mouth daily. 90 tablet 3  . zolpidem (AMBIEN) 10 MG tablet TAKE 1 TABLET AT BEDTIME AS NEEDED FOR SLEEP. 30 tablet 2  . Cholecalciferol 2000 units TABS Take 2 tablets (4,000 Units total) by mouth daily. 180 tablet 1  . EDARBYCLOR 40-12.5 MG TABS TAKE 1 TABLET ONCE DAILY. 90 tablet 0  . meloxicam (MOBIC) 15 MG tablet TAKE 1 TABLET EACH DAY. 90 tablet 0  . pravastatin (PRAVACHOL) 40 MG tablet TAKE ONE TABLET AT BEDTIME. 90 tablet 0  . Icosapent Ethyl (VASCEPA) 1 g CAPS Take 2 capsules (2 g total) by mouth 2 (two) times daily. 360 capsule 1  . Plecanatide (TRULANCE) 3 MG TABS Take 1 tablet by mouth daily. 90 tablet 1   No facility-administered medications prior to visit.    ROS Review of Systems  Constitutional: Positive for unexpected weight change (wt gain). Negative for appetite change, chills, diaphoresis and fatigue.  HENT: Negative.   Respiratory: Negative for apnea,  cough, shortness of breath and wheezing.        + snoring  Cardiovascular: Negative for chest pain, palpitations and leg swelling.  Gastrointestinal: Negative for abdominal pain, blood in stool, constipation, diarrhea, nausea and vomiting.  Endocrine: Negative.   Genitourinary: Negative.  Negative for difficulty urinating, discharge, dysuria, hematuria, scrotal swelling and testicular pain.  Musculoskeletal: Positive for arthralgias. Negative for myalgias.  Skin: Negative.  Negative for color change and pallor.  Neurological: Negative.  Negative for dizziness, weakness, light-headedness and headaches.  Hematological: Negative for adenopathy. Does not bruise/bleed easily.  Psychiatric/Behavioral: Positive for sleep disturbance.    Objective:  BP (!) 162/100   Pulse 70   Temp 98.2 F (36.8 C) (Oral)   Resp 16   Ht 5' 8.5" (1.74 m)   Wt 223 lb (101.2 kg)   SpO2 96%   BMI 33.41 kg/m   BP Readings from Last 3 Encounters:  01/18/20 (!) 162/100  02/25/19 120/84  11/16/18 (!) 160/94    Wt Readings from Last 3 Encounters:  01/18/20 223 lb (101.2 kg)  02/25/19 220 lb (99.8 kg)  11/16/18 243 lb (110.2 kg)    Physical Exam Vitals reviewed.  Constitutional:      Appearance: Normal appearance.  HENT:     Head:      Comments: Right outer helix there is a 2 mm crusty papulosquamous lesion.    Nose: Nose normal.  Mouth/Throat:     Mouth: Mucous membranes are moist.  Eyes:     General: No scleral icterus.    Conjunctiva/sclera: Conjunctivae normal.  Cardiovascular:     Rate and Rhythm: Regular rhythm. Bradycardia present.     Heart sounds: Normal heart sounds. No murmur heard.      Comments: EKG - Sinus bradycardia, 58 bpm No LVH Otherwise normal EKG Pulmonary:     Breath sounds: No stridor. No wheezing, rhonchi or rales.  Abdominal:     General: Abdomen is protuberant. Bowel sounds are normal. There is no distension.     Palpations: Abdomen is soft. There is no  hepatomegaly, splenomegaly or mass.     Tenderness: There is no abdominal tenderness.     Hernia: There is no hernia in the left inguinal area or right inguinal area.  Genitourinary:    Pubic Area: No rash.      Penis: Normal and uncircumcised. No lesions.      Testes:        Right: Mass, tenderness or swelling not present.        Left: Testicular hydrocele present. Mass, tenderness or swelling not present.     Epididymis:     Right: Normal. No mass or tenderness.     Left: Normal. No mass or tenderness.     Prostate: Normal. Not enlarged, not tender and no nodules present.     Rectum: Normal. Guaiac result negative. No mass, tenderness, anal fissure, external hemorrhoid or internal hemorrhoid. Normal anal tone.  Musculoskeletal:        General: Normal range of motion.     Cervical back: Neck supple.     Right lower leg: No edema.     Left lower leg: No edema.  Lymphadenopathy:     Cervical: No cervical adenopathy.     Lower Body: No right inguinal adenopathy. No left inguinal adenopathy.  Skin:    General: Skin is warm and dry.  Neurological:     General: No focal deficit present.     Mental Status: He is alert. Mental status is at baseline.  Psychiatric:        Mood and Affect: Mood normal.        Behavior: Behavior normal.     Lab Results  Component Value Date   WBC 5.9 01/18/2020   HGB 16.1 01/18/2020   HCT 46.3 01/18/2020   PLT 187.0 01/18/2020   GLUCOSE 124 (H) 01/18/2020   CHOL 165 01/18/2020   TRIG 78.0 01/18/2020   HDL 47.10 01/18/2020   LDLDIRECT 66.0 11/16/2018   LDLCALC 102 (H) 01/18/2020   ALT 23 01/18/2020   AST 24 01/18/2020   NA 140 01/18/2020   K 3.9 01/18/2020   CL 101 01/18/2020   CREATININE 1.13 01/18/2020   BUN 14 01/18/2020   CO2 31 01/18/2020   TSH 0.93 01/18/2020   PSA 1.13 01/18/2020   HGBA1C 6.2 01/18/2020   MICROALBUR 1.3 01/18/2020    CT Renal Stone Study  Result Date: 01/25/2017 CLINICAL DATA:  Left flank pain.  No history of  stones. EXAM: CT ABDOMEN AND PELVIS WITHOUT CONTRAST TECHNIQUE: Multidetector CT imaging of the abdomen and pelvis was performed following the standard protocol without IV contrast. COMPARISON:  None. FINDINGS: Lower chest: No acute abnormality. Hepatobiliary: No focal liver abnormality is seen. No gallstones, gallbladder wall thickening, or biliary dilatation. Pancreas: Unremarkable. No pancreatic ductal dilatation or surrounding inflammatory changes. Spleen: Normal in size without focal abnormality. Adrenals/Urinary Tract: Adrenal  glands are normal. There is mild hydronephrosis and very nephric stranding on the left. This is due to a proximal left ureteral stone measuring up to 4.7 mm on coronal image 60. The kidneys, ureters, and bladder are otherwise unremarkable. Stomach/Bowel: The stomach and small bowel are normal. Mild colonic diverticulosis is seen without diverticulitis. The colon is otherwise normal. The appendix is normal as well. Vascular/Lymphatic: No significant vascular findings are present. No enlarged abdominal or pelvic lymph nodes. Reproductive: Prostate is unremarkable. Other: No abdominal wall hernia or abnormality. No abdominopelvic ascites. Musculoskeletal: No acute or significant osseous findings. IMPRESSION: 1. There is a 4.7 mm stone in the proximal to mid left ureter resulting in mild hydronephrosis and perinephric stranding, explaining the patient's symptoms. 2. Mild colonic diverticulosis without diverticulitis. 3. No other significant abnormalities. Electronically Signed   By: Gerome Sam III M.D   On: 01/25/2017 17:34    Assessment & Plan:   Devin Roberts was seen today for hypertension, annual exam, hyperlipidemia and diabetes.  Diagnoses and all orders for this visit:  Essential hypertension- His blood pressure is not adequately well controlled.  His labs are negative for secondary causes or endorgan damage.  Will continue the same dose of the ARB.  I recommended that he  increase the dose of the thiazide diuretic and to add nebivolol.  His EKG is negative for LVH or ischemia. -     CBC with Differential/Platelet; Future -     Basic metabolic panel; Future -     TSH; Future -     Urinalysis, Routine w reflex microscopic; Future -     Azilsartan-Chlorthalidone (EDARBYCLOR) 40-25 MG TABS; Take 1 tablet by mouth daily. -     Discontinue: nebivolol (BYSTOLIC) 5 MG tablet; Take 1 tablet (5 mg total) by mouth daily. -     EKG 12-Lead -     Urinalysis, Routine w reflex microscopic -     TSH -     Basic metabolic panel -     CBC with Differential/Platelet -     nebivolol (BYSTOLIC) 5 MG tablet; Take 1 tablet (5 mg total) by mouth daily.  Annual physical exam- Exam completed, labs reviewed, vaccines reviewed and updated, cancer screenings are up-to-date, patient education material was given. -     Lipid panel; Future -     PSA; Future -     PSA -     Lipid panel  Type II diabetes mellitus with complication, uncontrolled (HCC)- His A1c is at 6.2%.  His blood sugars are adequately well controlled.  Medical therapy is not indicated. -     Microalbumin / creatinine urine ratio; Future -     Hemoglobin A1c; Future -     Urinalysis, Routine w reflex microscopic; Future -     Azilsartan-Chlorthalidone (EDARBYCLOR) 40-25 MG TABS; Take 1 tablet by mouth daily. -     Urinalysis, Routine w reflex microscopic -     Hemoglobin A1c -     Microalbumin / creatinine urine ratio -     HM Diabetes Foot Exam  Hyperlipidemia with target LDL less than 100- He has achieved his LDL goal and is doing well on the statin. -     Hepatic function panel; Future -     TSH; Future -     TSH -     Hepatic function panel -     pravastatin (PRAVACHOL) 40 MG tablet; Take 1 tablet (40 mg total) by mouth at bedtime.  Vitamin D  deficiency -     VITAMIN D 25 Hydroxy (Vit-D Deficiency, Fractures); Future -     VITAMIN D 25 Hydroxy (Vit-D Deficiency, Fractures) -     Cholecalciferol 50 MCG  (2000 UT) TABS; Take 2 tablets (4,000 Units total) by mouth daily.  Snoring- He has refractory hypertension and snoring.  I recommended that he be evaluated for sleep apnea. -     Ambulatory referral to Sleep Studies  Lesion of skin of right ear -     Ambulatory referral to Dermatology  Other orders -     Flu Vaccine QUAD 6+ mos PF IM (Fluarix Quad PF)   I have discontinued Zyaire L. Bernet's Vascepa, Trulance, meloxicam, and Edarbyclor. I have also changed his Cholecalciferol and pravastatin. Additionally, I am having him start on Edarbyclor. Lastly, I am having him maintain his aspirin EC, zolpidem, and nebivolol.  Meds ordered this encounter  Medications  . Azilsartan-Chlorthalidone (EDARBYCLOR) 40-25 MG TABS    Sig: Take 1 tablet by mouth daily.    Dispense:  90 tablet    Refill:  0  . DISCONTD: nebivolol (BYSTOLIC) 5 MG tablet    Sig: Take 1 tablet (5 mg total) by mouth daily.    Dispense:  90 tablet    Refill:  0  . Cholecalciferol 50 MCG (2000 UT) TABS    Sig: Take 2 tablets (4,000 Units total) by mouth daily.    Dispense:  180 tablet    Refill:  1  . pravastatin (PRAVACHOL) 40 MG tablet    Sig: Take 1 tablet (40 mg total) by mouth at bedtime.    Dispense:  90 tablet    Refill:  1  . nebivolol (BYSTOLIC) 5 MG tablet    Sig: Take 1 tablet (5 mg total) by mouth daily.    Dispense:  90 tablet    Refill:  0   In addition to time spent on CPE, I spent 50 minutes in preparing to see the patient by review of recent labs, imaging and procedures, obtaining and reviewing separately obtained history, communicating with the patient and family or caregiver, ordering medications, tests or procedures, and documenting clinical information in the EHR including the differential Dx, treatment, and any further evaluation and other management of 1. Essential hypertension 2. Type II diabetes mellitus with complication, uncontrolled (HCC) 3. Hyperlipidemia with target LDL less than 100 4.  Vitamin D deficiency 5. Snoring 6. Lesion of skin of right ear     Follow-up: Return in about 6 weeks (around 02/29/2020).  Sanda Linger, MD

## 2020-01-19 MED ORDER — NEBIVOLOL HCL 5 MG PO TABS: 5.0000 mg | ORAL_TABLET | Freq: Every day | ORAL | 0 refills | Status: DC

## 2020-01-24 ENCOUNTER — Other Ambulatory Visit: Payer: Self-pay | Admitting: Internal Medicine

## 2020-02-03 ENCOUNTER — Encounter: Payer: Self-pay | Admitting: Internal Medicine

## 2020-02-07 ENCOUNTER — Other Ambulatory Visit: Payer: Self-pay | Admitting: Internal Medicine

## 2020-02-10 ENCOUNTER — Other Ambulatory Visit: Payer: Self-pay | Admitting: Internal Medicine

## 2020-02-29 LAB — HM DIABETES EYE EXAM

## 2020-03-02 ENCOUNTER — Encounter: Payer: Self-pay | Admitting: Internal Medicine

## 2020-03-02 ENCOUNTER — Other Ambulatory Visit: Payer: Self-pay

## 2020-03-02 ENCOUNTER — Ambulatory Visit: Payer: PRIVATE HEALTH INSURANCE | Admitting: Internal Medicine

## 2020-03-02 VITALS — BP 118/78 | HR 55 | Temp 97.6°F | Resp 16 | Ht 68.5 in | Wt 222.0 lb

## 2020-03-02 DIAGNOSIS — I10 Essential (primary) hypertension: Secondary | ICD-10-CM | POA: Diagnosis not present

## 2020-03-02 NOTE — Progress Notes (Signed)
Subjective:  Patient ID: Devin Roberts, male    DOB: 1961/01/27  Age: 59 y.o. MRN: [id number]  CC: Hypertension  This visit occurred during the SARS-CoV-2 public health emergency.  Safety protocols were in place, including screening questions prior to the visit, additional usage of staff PPE, and extensive cleaning of exam room while observing appropriate contact time as indicated for disinfecting solutions.    HPI Devin Roberts presents for f/up - He tells me his blood pressure has been very well controlled but since he started taking nebivolol he has experienced fatigue.  He denies dizziness, lightheadedness, palpitations, headache, blurred vision, near syncope, or edema.  Outpatient Medications Prior to Visit  Medication Sig Dispense Refill  . aspirin EC 81 MG tablet Take 1 tablet (81 mg total) by mouth daily. 90 tablet 3  . Azilsartan-Chlorthalidone (EDARBYCLOR) 40-25 MG TABS Take 1 tablet by mouth daily. 90 tablet 0  . Cholecalciferol 50 MCG (2000 UT) TABS Take 2 tablets (4,000 Units total) by mouth daily. 180 tablet 1  . meloxicam (MOBIC) 15 MG tablet TAKE 1 TABLET EACH DAY. 90 tablet 0  . pravastatin (PRAVACHOL) 40 MG tablet Take 1 tablet (40 mg total) by mouth at bedtime. 90 tablet 1  . VASCEPA 1 g capsule TAKE (2) CAPSULES BY MOUTH TWICE DAILY. 360 capsule 1  . zolpidem (AMBIEN) 10 MG tablet TAKE 1 TABLET AT BEDTIME AS NEEDED FOR SLEEP. 90 tablet 0  . nebivolol (BYSTOLIC) 5 MG tablet Take 1 tablet (5 mg total) by mouth daily. 90 tablet 0   No facility-administered medications prior to visit.    ROS Review of Systems  Constitutional: Positive for fatigue. Negative for appetite change, chills, diaphoresis, fever and unexpected weight change.  HENT: Negative.   Eyes: Negative.   Respiratory: Negative for cough, chest tightness, shortness of breath and wheezing.   Cardiovascular: Negative for chest pain, palpitations and leg swelling.  Gastrointestinal: Negative for  abdominal pain, constipation, diarrhea, nausea and vomiting.  Endocrine: Negative.   Genitourinary: Negative.  Negative for difficulty urinating and dysuria.  Musculoskeletal: Negative for arthralgias and myalgias.  Skin: Negative.  Negative for color change.  Neurological: Negative.  Negative for dizziness, weakness and light-headedness.  Hematological: Negative for adenopathy. Does not bruise/bleed easily.  Psychiatric/Behavioral: Negative.     Objective:  BP 118/78   Pulse (!) 55   Temp 97.6 F (36.4 C) (Oral)   Resp 16   Ht 5' 8.5" (1.74 m)   Wt 222 lb (100.7 kg)   SpO2 97%   BMI 33.26 kg/m   BP Readings from Last 3 Encounters:  03/02/20 118/78  01/18/20 (!) 162/100  02/25/19 120/84    Wt Readings from Last 3 Encounters:  03/02/20 222 lb (100.7 kg)  01/18/20 223 lb (101.2 kg)  02/25/19 220 lb (99.8 kg)    Physical Exam Vitals reviewed.  HENT:     Nose: Nose normal.     Mouth/Throat:     Mouth: Mucous membranes are moist.  Eyes:     General: No scleral icterus.    Conjunctiva/sclera: Conjunctivae normal.  Cardiovascular:     Rate and Rhythm: Regular rhythm. Bradycardia present.     Heart sounds: No murmur heard.   Pulmonary:     Effort: Pulmonary effort is normal.     Breath sounds: No stridor. No wheezing, rhonchi or rales.  Abdominal:     General: Abdomen is protuberant. Bowel sounds are normal. There is no distension.  Palpations: Abdomen is soft. There is no hepatomegaly, splenomegaly or mass.  Musculoskeletal:        General: Normal range of motion.     Cervical back: Neck supple.     Right lower leg: No edema.     Left lower leg: No edema.  Lymphadenopathy:     Cervical: No cervical adenopathy.  Skin:    General: Skin is warm and dry.  Neurological:     General: No focal deficit present.     Lab Results  Component Value Date   WBC 5.9 01/18/2020   HGB 16.1 01/18/2020   HCT 46.3 01/18/2020   PLT 187.0 01/18/2020   GLUCOSE 124 (H)  01/18/2020   CHOL 165 01/18/2020   TRIG 78.0 01/18/2020   HDL 47.10 01/18/2020   LDLDIRECT 66.0 11/16/2018   LDLCALC 102 (H) 01/18/2020   ALT 23 01/18/2020   AST 24 01/18/2020   NA 140 01/18/2020   K 3.9 01/18/2020   CL 101 01/18/2020   CREATININE 1.13 01/18/2020   BUN 14 01/18/2020   CO2 31 01/18/2020   TSH 0.93 01/18/2020   PSA 1.13 01/18/2020   HGBA1C 6.2 01/18/2020   MICROALBUR 1.3 01/18/2020    CT Renal Stone Study  Result Date: 01/25/2017 CLINICAL DATA:  Left flank pain.  No history of stones. EXAM: CT ABDOMEN AND PELVIS WITHOUT CONTRAST TECHNIQUE: Multidetector CT imaging of the abdomen and pelvis was performed following the standard protocol without IV contrast. COMPARISON:  None. FINDINGS: Lower chest: No acute abnormality. Hepatobiliary: No focal liver abnormality is seen. No gallstones, gallbladder wall thickening, or biliary dilatation. Pancreas: Unremarkable. No pancreatic ductal dilatation or surrounding inflammatory changes. Spleen: Normal in size without focal abnormality. Adrenals/Urinary Tract: Adrenal glands are normal. There is mild hydronephrosis and very nephric stranding on the left. This is due to a proximal left ureteral stone measuring up to 4.7 mm on coronal image 60. The kidneys, ureters, and bladder are otherwise unremarkable. Stomach/Bowel: The stomach and small bowel are normal. Mild colonic diverticulosis is seen without diverticulitis. The colon is otherwise normal. The appendix is normal as well. Vascular/Lymphatic: No significant vascular findings are present. No enlarged abdominal or pelvic lymph nodes. Reproductive: Prostate is unremarkable. Other: No abdominal wall hernia or abnormality. No abdominopelvic ascites. Musculoskeletal: No acute or significant osseous findings. IMPRESSION: 1. There is a 4.7 mm stone in the proximal to mid left ureter resulting in mild hydronephrosis and perinephric stranding, explaining the patient's symptoms. 2. Mild colonic  diverticulosis without diverticulitis. 3. No other significant abnormalities. Electronically Signed   By: Gerome Sam III M.D   On: 01/25/2017 17:34    Assessment & Plan:   Devin Roberts was seen today for hypertension.  Diagnoses and all orders for this visit:  Essential hypertension- His blood pressure is somewhat overcontrolled and he is bradycardic and complains of fatigue.  Will discontinue nebivolol.  He will continue the ARB and thiazide diuretic.   I have discontinued Amer L. Jaye's nebivolol. I am also having him maintain his aspirin EC, Edarbyclor, Cholecalciferol, pravastatin, meloxicam, Vascepa, and zolpidem.  No orders of the defined types were placed in this encounter.    Follow-up: Return in about 6 months (around 08/31/2020).  Sanda Linger, MD

## 2020-03-02 NOTE — Patient Instructions (Signed)

## 2020-04-14 ENCOUNTER — Other Ambulatory Visit: Payer: Self-pay | Admitting: Internal Medicine

## 2020-04-14 DIAGNOSIS — I10 Essential (primary) hypertension: Secondary | ICD-10-CM

## 2020-04-14 DIAGNOSIS — E1165 Type 2 diabetes mellitus with hyperglycemia: Secondary | ICD-10-CM

## 2020-04-14 DIAGNOSIS — IMO0002 Reserved for concepts with insufficient information to code with codable children: Secondary | ICD-10-CM

## 2020-05-12 ENCOUNTER — Other Ambulatory Visit: Payer: Self-pay | Admitting: Internal Medicine

## 2020-05-24 ENCOUNTER — Telehealth: Payer: Self-pay

## 2020-05-24 NOTE — Telephone Encounter (Signed)
Key: [id number]

## 2020-05-24 NOTE — Telephone Encounter (Signed)
approved through 05/24/2021.

## 2020-07-14 ENCOUNTER — Other Ambulatory Visit: Payer: Self-pay | Admitting: Internal Medicine

## 2020-07-14 DIAGNOSIS — E785 Hyperlipidemia, unspecified: Secondary | ICD-10-CM

## 2020-09-11 ENCOUNTER — Other Ambulatory Visit: Payer: Self-pay | Admitting: Internal Medicine

## 2020-10-01 ENCOUNTER — Other Ambulatory Visit: Payer: Self-pay | Admitting: Internal Medicine

## 2020-10-01 DIAGNOSIS — I10 Essential (primary) hypertension: Secondary | ICD-10-CM

## 2020-10-01 DIAGNOSIS — IMO0002 Reserved for concepts with insufficient information to code with codable children: Secondary | ICD-10-CM

## 2020-10-01 DIAGNOSIS — E1165 Type 2 diabetes mellitus with hyperglycemia: Secondary | ICD-10-CM

## 2020-10-04 ENCOUNTER — Telehealth: Payer: Self-pay

## 2020-10-04 NOTE — Telephone Encounter (Signed)
Key: [id number]

## 2020-10-06 NOTE — Telephone Encounter (Signed)
approved through 10/04/2021

## 2020-10-10 ENCOUNTER — Ambulatory Visit: Payer: No Typology Code available for payment source | Admitting: Internal Medicine

## 2020-10-10 ENCOUNTER — Encounter: Payer: Self-pay | Admitting: Internal Medicine

## 2020-10-10 ENCOUNTER — Other Ambulatory Visit: Payer: Self-pay

## 2020-10-10 VITALS — BP 146/94 | HR 64 | Temp 98.2°F | Resp 16 | Ht 68.5 in | Wt 218.0 lb

## 2020-10-10 DIAGNOSIS — E118 Type 2 diabetes mellitus with unspecified complications: Secondary | ICD-10-CM

## 2020-10-10 DIAGNOSIS — M545 Low back pain, unspecified: Secondary | ICD-10-CM | POA: Diagnosis not present

## 2020-10-10 DIAGNOSIS — E1165 Type 2 diabetes mellitus with hyperglycemia: Secondary | ICD-10-CM

## 2020-10-10 DIAGNOSIS — E785 Hyperlipidemia, unspecified: Secondary | ICD-10-CM

## 2020-10-10 DIAGNOSIS — I10 Essential (primary) hypertension: Secondary | ICD-10-CM

## 2020-10-10 DIAGNOSIS — F5104 Psychophysiologic insomnia: Secondary | ICD-10-CM

## 2020-10-10 DIAGNOSIS — IMO0002 Reserved for concepts with insufficient information to code with codable children: Secondary | ICD-10-CM

## 2020-10-10 LAB — BASIC METABOLIC PANEL
BUN: 22 mg/dL (ref 6–23)
CO2: 30 mEq/L (ref 19–32)
Calcium: 9.4 mg/dL (ref 8.4–10.5)
Chloride: 102 mEq/L (ref 96–112)
Creatinine, Ser: 1.11 mg/dL (ref 0.40–1.50)
GFR: 73.15 mL/min (ref >60.00)
Glucose, Bld: 122 mg/dL — ABNORMAL HIGH (ref 70–99)
Potassium: 3.7 mEq/L (ref 3.5–5.1)
Sodium: 138 mEq/L (ref 135–145)

## 2020-10-10 LAB — HEMOGLOBIN A1C: Hgb A1c MFr Bld: 6.2 % (ref 4.6–6.5)

## 2020-10-10 MED ORDER — ZOLPIDEM TARTRATE 10 MG PO TABS: 10.0000 mg | ORAL_TABLET | Freq: Every evening | ORAL | 0 refills | Status: DC | PRN

## 2020-10-10 MED ORDER — PRAVASTATIN SODIUM 40 MG PO TABS: 40.0000 mg | ORAL_TABLET | Freq: Every day | ORAL | 1 refills | Status: DC

## 2020-10-10 MED ORDER — MELOXICAM 15 MG PO TABS: 15.0000 mg | ORAL_TABLET | Freq: Every day | ORAL | 0 refills | Status: DC

## 2020-10-10 MED ORDER — ICOSAPENT ETHYL 1 G PO CAPS
ORAL_CAPSULE | ORAL | 1 refills | Status: DC
Start: 2020-10-10 — End: 2021-11-23

## 2020-10-10 MED ORDER — EDARBYCLOR 40-25 MG PO TABS: 1.0000 | ORAL_TABLET | Freq: Every day | ORAL | 1 refills | Status: DC

## 2020-10-10 NOTE — Progress Notes (Signed)
Subjective:  Patient ID: Devin Roberts, male    DOB: 10/31/1960  Age: 60 y.o. MRN: [id number]  CC: Hypertension and Diabetes  This visit occurred during the SARS-CoV-2 public health emergency.  Safety protocols were in place, including screening questions prior to the visit, additional usage of staff PPE, and extensive cleaning of exam room while observing appropriate contact time as indicated for disinfecting solutions.    HPI Devin Roberts presents for f/up   He thinks his blood pressure has been well controlled.  He is active and denies any recent episodes of dizziness, lightheadedness, chest pain, shortness of breath, edema, or fatigue.  Outpatient Medications Prior to Visit  Medication Sig Dispense Refill   aspirin EC 81 MG tablet Take 1 tablet (81 mg total) by mouth daily. 90 tablet 3   Cholecalciferol 50 MCG (2000 UT) TABS Take 2 tablets (4,000 Units total) by mouth daily. 180 tablet 1   EDARBYCLOR 40-25 MG TABS TAKE 1 TABLET ONCE DAILY. 90 tablet 1   meloxicam (MOBIC) 15 MG tablet TAKE 1 TABLET EACH DAY. 90 tablet 0   pravastatin (PRAVACHOL) 40 MG tablet TAKE ONE TABLET AT BEDTIME. 90 tablet 1   VASCEPA 1 g capsule TAKE (2) CAPSULES BY MOUTH TWICE DAILY. 360 capsule 1   zolpidem (AMBIEN) 10 MG tablet TAKE 1 TABLET AT BEDTIME AS NEEDED FOR SLEEP. 30 tablet 3   No facility-administered medications prior to visit.    ROS Review of Systems  Constitutional:  Negative for diaphoresis, fatigue and unexpected weight change.  HENT: Negative.    Eyes: Negative.   Respiratory:  Negative for apnea, chest tightness and shortness of breath.   Cardiovascular:  Negative for chest pain and palpitations.  Gastrointestinal:  Negative for abdominal pain, diarrhea, nausea and vomiting.  Endocrine: Negative.   Genitourinary: Negative.  Negative for difficulty urinating.  Musculoskeletal:  Positive for back pain. Negative for arthralgias and myalgias.  Skin:  Negative for color change and  pallor.  Neurological:  Negative for dizziness, weakness and light-headedness.  Hematological:  Negative for adenopathy. Does not bruise/bleed easily.  Psychiatric/Behavioral:  Positive for sleep disturbance. Negative for dysphoric mood.    Objective:  BP (!) 146/94 (BP Location: Left Arm, Patient Position: Sitting, Cuff Size: Large)   Pulse 64   Temp 98.2 F (36.8 C) (Oral)   Resp 16   Ht 5' 8.5" (1.74 m)   Wt 218 lb (98.9 kg)   SpO2 98%   BMI 32.66 kg/m   BP Readings from Last 3 Encounters:  10/10/20 (!) 146/94  03/02/20 118/78  01/18/20 (!) 162/100    Wt Readings from Last 3 Encounters:  10/10/20 218 lb (98.9 kg)  03/02/20 222 lb (100.7 kg)  01/18/20 223 lb (101.2 kg)    Physical Exam Vitals reviewed.  HENT:     Nose: Nose normal.     Mouth/Throat:     Mouth: Mucous membranes are moist.  Eyes:     Conjunctiva/sclera: Conjunctivae normal.  Cardiovascular:     Rate and Rhythm: Normal rate and regular rhythm.     Heart sounds: No murmur heard. Pulmonary:     Effort: Pulmonary effort is normal.     Breath sounds: Normal breath sounds.  Abdominal:     General: Abdomen is flat. Bowel sounds are normal.     Palpations: Abdomen is soft. There is no mass.  Musculoskeletal:        General: Normal range of motion.     Cervical  back: Neck supple.     Right lower leg: No edema.     Left lower leg: No edema.  Lymphadenopathy:     Cervical: No cervical adenopathy.  Skin:    General: Skin is warm and dry.  Neurological:     General: No focal deficit present.     Mental Status: He is alert.  Psychiatric:        Mood and Affect: Mood normal.        Behavior: Behavior normal.    Lab Results  Component Value Date   WBC 5.9 01/18/2020   HGB 16.1 01/18/2020   HCT 46.3 01/18/2020   PLT 187.0 01/18/2020   GLUCOSE 122 (H) 10/10/2020   CHOL 165 01/18/2020   TRIG 78.0 01/18/2020   HDL 47.10 01/18/2020   LDLDIRECT 66.0 11/16/2018   LDLCALC 102 (H) 01/18/2020   ALT 23  01/18/2020   AST 24 01/18/2020   NA 138 10/10/2020   K 3.7 10/10/2020   CL 102 10/10/2020   CREATININE 1.11 10/10/2020   BUN 22 10/10/2020   CO2 30 10/10/2020   TSH 0.93 01/18/2020   PSA 1.13 01/18/2020   HGBA1C 6.2 10/10/2020   MICROALBUR 1.3 01/18/2020    CT Renal Stone Study  Result Date: 01/25/2017 CLINICAL DATA:  Left flank pain.  No history of stones. EXAM: CT ABDOMEN AND PELVIS WITHOUT CONTRAST TECHNIQUE: Multidetector CT imaging of the abdomen and pelvis was performed following the standard protocol without IV contrast. COMPARISON:  None. FINDINGS: Lower chest: No acute abnormality. Hepatobiliary: No focal liver abnormality is seen. No gallstones, gallbladder wall thickening, or biliary dilatation. Pancreas: Unremarkable. No pancreatic ductal dilatation or surrounding inflammatory changes. Spleen: Normal in size without focal abnormality. Adrenals/Urinary Tract: Adrenal glands are normal. There is mild hydronephrosis and very nephric stranding on the left. This is due to a proximal left ureteral stone measuring up to 4.7 mm on coronal image 60. The kidneys, ureters, and bladder are otherwise unremarkable. Stomach/Bowel: The stomach and small bowel are normal. Mild colonic diverticulosis is seen without diverticulitis. The colon is otherwise normal. The appendix is normal as well. Vascular/Lymphatic: No significant vascular findings are present. No enlarged abdominal or pelvic lymph nodes. Reproductive: Prostate is unremarkable. Other: No abdominal wall hernia or abnormality. No abdominopelvic ascites. Musculoskeletal: No acute or significant osseous findings. IMPRESSION: 1. There is a 4.7 mm stone in the proximal to mid left ureter resulting in mild hydronephrosis and perinephric stranding, explaining the patient's symptoms. 2. Mild colonic diverticulosis without diverticulitis. 3. No other significant abnormalities. Electronically Signed   By: Gerome Sam III M.D   On: 01/25/2017 17:34     Assessment & Plan:   Devin Roberts was seen today for hypertension and diabetes.  Diagnoses and all orders for this visit:  Low back pain at multiple sites- He is getting adequate symptom relief with meloxicam. -     meloxicam (MOBIC) 15 MG tablet; Take 1 tablet (15 mg total) by mouth daily.  Essential hypertension- He has not achieved his blood pressure goal of 130/80.  Will continue the current antihypertensives and I have encouraged him to improve his lifestyle modifications. -     Azilsartan-Chlorthalidone (EDARBYCLOR) 40-25 MG TABS; Take 1 tablet by mouth daily. -     Basic metabolic panel; Future -     Basic metabolic panel  Type II diabetes mellitus with complication, uncontrolled (HCC)- His blood sugar is adequately well controlled. -     Azilsartan-Chlorthalidone (EDARBYCLOR) 40-25 MG TABS; Take  1 tablet by mouth daily. -     Basic metabolic panel; Future -     Hemoglobin A1c; Future -     Hemoglobin A1c -     Basic metabolic panel  Hyperlipidemia with target LDL less than 100 -     pravastatin (PRAVACHOL) 40 MG tablet; Take 1 tablet (40 mg total) by mouth at bedtime.  Psychophysiological insomnia -     zolpidem (AMBIEN) 10 MG tablet; Take 1 tablet (10 mg total) by mouth at bedtime as needed. for sleep  Other orders -     icosapent Ethyl (VASCEPA) 1 g capsule; TAKE (2) CAPSULES BY MOUTH TWICE DAILY.  I have changed Witten L. Horger's Vascepa to icosapent Ethyl. I have also changed his Edarbyclor, meloxicam, pravastatin, and zolpidem. I am also having him maintain his aspirin EC and Cholecalciferol.  Meds ordered this encounter  Medications   Azilsartan-Chlorthalidone (EDARBYCLOR) 40-25 MG TABS    Sig: Take 1 tablet by mouth daily.    Dispense:  90 tablet    Refill:  1   meloxicam (MOBIC) 15 MG tablet    Sig: Take 1 tablet (15 mg total) by mouth daily.    Dispense:  90 tablet    Refill:  0   pravastatin (PRAVACHOL) 40 MG tablet    Sig: Take 1 tablet (40 mg total) by  mouth at bedtime.    Dispense:  90 tablet    Refill:  1    This prescription was filled on 04/25/2020. Any refills authorized will be placed on file.   icosapent Ethyl (VASCEPA) 1 g capsule    Sig: TAKE (2) CAPSULES BY MOUTH TWICE DAILY.    Dispense:  360 capsule    Refill:  1   zolpidem (AMBIEN) 10 MG tablet    Sig: Take 1 tablet (10 mg total) by mouth at bedtime as needed. for sleep    Dispense:  90 tablet    Refill:  0     Follow-up: Return in about 6 months (around 04/12/2021).  Sanda Linger, MD

## 2020-10-10 NOTE — Patient Instructions (Signed)

## 2021-03-06 LAB — HM DIABETES EYE EXAM

## 2021-04-04 ENCOUNTER — Other Ambulatory Visit: Payer: Self-pay | Admitting: Internal Medicine

## 2021-04-04 DIAGNOSIS — I10 Essential (primary) hypertension: Secondary | ICD-10-CM

## 2021-04-04 DIAGNOSIS — F5104 Psychophysiologic insomnia: Secondary | ICD-10-CM

## 2021-04-05 ENCOUNTER — Other Ambulatory Visit: Payer: Self-pay | Admitting: Internal Medicine

## 2021-04-05 DIAGNOSIS — I10 Essential (primary) hypertension: Secondary | ICD-10-CM

## 2021-04-05 DIAGNOSIS — F5104 Psychophysiologic insomnia: Secondary | ICD-10-CM

## 2021-05-03 ENCOUNTER — Other Ambulatory Visit: Payer: Self-pay | Admitting: Internal Medicine

## 2021-05-03 DIAGNOSIS — M545 Low back pain, unspecified: Secondary | ICD-10-CM

## 2021-05-25 ENCOUNTER — Inpatient Hospital Stay (HOSPITAL_COMMUNITY)
Admission: EM | Admit: 2021-05-25 | Discharge: 2021-05-30 | DRG: 493 | Disposition: A | Discharge status: Rehabilitation Facility | Payer: Worker's Compensation | Attending: Surgery | Admitting: Surgery

## 2021-05-25 ENCOUNTER — Emergency Department (HOSPITAL_COMMUNITY): Payer: Worker's Compensation

## 2021-05-25 ENCOUNTER — Encounter (HOSPITAL_COMMUNITY): Payer: Self-pay

## 2021-05-25 ENCOUNTER — Other Ambulatory Visit: Payer: Self-pay

## 2021-05-25 DIAGNOSIS — G4700 Insomnia, unspecified: Secondary | ICD-10-CM | POA: Diagnosis present

## 2021-05-25 DIAGNOSIS — S82899A Other fracture of unspecified lower leg, initial encounter for closed fracture: Secondary | ICD-10-CM

## 2021-05-25 DIAGNOSIS — D62 Acute posthemorrhagic anemia: Secondary | ICD-10-CM | POA: Diagnosis not present

## 2021-05-25 DIAGNOSIS — N179 Acute kidney failure, unspecified: Secondary | ICD-10-CM | POA: Diagnosis present

## 2021-05-25 DIAGNOSIS — Z791 Long term (current) use of non-steroidal anti-inflammatories (NSAID): Secondary | ICD-10-CM

## 2021-05-25 DIAGNOSIS — S32029A Unspecified fracture of second lumbar vertebra, initial encounter for closed fracture: Secondary | ICD-10-CM | POA: Diagnosis present

## 2021-05-25 DIAGNOSIS — S32010A Wedge compression fracture of first lumbar vertebra, initial encounter for closed fracture: Secondary | ICD-10-CM

## 2021-05-25 DIAGNOSIS — I10 Essential (primary) hypertension: Secondary | ICD-10-CM | POA: Diagnosis present

## 2021-05-25 DIAGNOSIS — Z888 Allergy status to other drugs, medicaments and biological substances status: Secondary | ICD-10-CM

## 2021-05-25 DIAGNOSIS — S42114A Nondisplaced fracture of body of scapula, right shoulder, initial encounter for closed fracture: Secondary | ICD-10-CM | POA: Diagnosis present

## 2021-05-25 DIAGNOSIS — Z6833 Body mass index (BMI) 33.0-33.9, adult: Secondary | ICD-10-CM

## 2021-05-25 DIAGNOSIS — S32020A Wedge compression fracture of second lumbar vertebra, initial encounter for closed fracture: Secondary | ICD-10-CM

## 2021-05-25 DIAGNOSIS — K219 Gastro-esophageal reflux disease without esophagitis: Secondary | ICD-10-CM | POA: Diagnosis present

## 2021-05-25 DIAGNOSIS — Z23 Encounter for immunization: Secondary | ICD-10-CM

## 2021-05-25 DIAGNOSIS — W3189XA Contact with other specified machinery, initial encounter: Secondary | ICD-10-CM | POA: Diagnosis not present

## 2021-05-25 DIAGNOSIS — S82842A Displaced bimalleolar fracture of left lower leg, initial encounter for closed fracture: Secondary | ICD-10-CM

## 2021-05-25 DIAGNOSIS — S32019A Unspecified fracture of first lumbar vertebra, initial encounter for closed fracture: Secondary | ICD-10-CM | POA: Diagnosis present

## 2021-05-25 DIAGNOSIS — Y990 Civilian activity done for income or pay: Secondary | ICD-10-CM | POA: Diagnosis not present

## 2021-05-25 DIAGNOSIS — J45909 Unspecified asthma, uncomplicated: Secondary | ICD-10-CM | POA: Diagnosis present

## 2021-05-25 DIAGNOSIS — S82862A Displaced Maisonneuve's fracture of left leg, initial encounter for closed fracture: Secondary | ICD-10-CM | POA: Diagnosis present

## 2021-05-25 DIAGNOSIS — S82102A Unspecified fracture of upper end of left tibia, initial encounter for closed fracture: Secondary | ICD-10-CM | POA: Diagnosis present

## 2021-05-25 DIAGNOSIS — Z419 Encounter for procedure for purposes other than remedying health state, unspecified: Secondary | ICD-10-CM

## 2021-05-25 DIAGNOSIS — E669 Obesity, unspecified: Secondary | ICD-10-CM | POA: Diagnosis present

## 2021-05-25 DIAGNOSIS — Z20822 Contact with and (suspected) exposure to covid-19: Secondary | ICD-10-CM | POA: Diagnosis present

## 2021-05-25 DIAGNOSIS — T1490XA Injury, unspecified, initial encounter: Secondary | ICD-10-CM

## 2021-05-25 DIAGNOSIS — T148XXA Other injury of unspecified body region, initial encounter: Secondary | ICD-10-CM

## 2021-05-25 DIAGNOSIS — E7800 Pure hypercholesterolemia, unspecified: Secondary | ICD-10-CM | POA: Diagnosis present

## 2021-05-25 DIAGNOSIS — E1165 Type 2 diabetes mellitus with hyperglycemia: Secondary | ICD-10-CM | POA: Diagnosis present

## 2021-05-25 DIAGNOSIS — S82832A Other fracture of upper and lower end of left fibula, initial encounter for closed fracture: Secondary | ICD-10-CM

## 2021-05-25 DIAGNOSIS — S82872A Displaced pilon fracture of left tibia, initial encounter for closed fracture: Principal | ICD-10-CM | POA: Diagnosis present

## 2021-05-25 DIAGNOSIS — T07XXXA Unspecified multiple injuries, initial encounter: Secondary | ICD-10-CM

## 2021-05-25 DIAGNOSIS — Z79899 Other long term (current) drug therapy: Secondary | ICD-10-CM | POA: Diagnosis not present

## 2021-05-25 DIAGNOSIS — K5900 Constipation, unspecified: Secondary | ICD-10-CM | POA: Diagnosis not present

## 2021-05-25 DIAGNOSIS — Z885 Allergy status to narcotic agent status: Secondary | ICD-10-CM | POA: Diagnosis not present

## 2021-05-25 DIAGNOSIS — S42101A Fracture of unspecified part of scapula, right shoulder, initial encounter for closed fracture: Secondary | ICD-10-CM

## 2021-05-25 LAB — CBC
HCT: 41.2 % (ref 39.0–52.0)
Hemoglobin: 14.5 g/dL (ref 13.0–17.0)
MCH: 33.3 pg (ref 26.0–34.0)
MCHC: 35.2 g/dL (ref 30.0–36.0)
MCV: 94.5 fL (ref 80.0–100.0)
Platelets: 239 10*3/uL (ref 150–400)
RBC: 4.36 MIL/uL (ref 4.22–5.81)
RDW: 12.1 % (ref 11.5–15.5)
WBC: 14.6 10*3/uL — ABNORMAL HIGH (ref 4.0–10.5)
nRBC: 0 % (ref 0.0–0.2)

## 2021-05-25 LAB — COMPREHENSIVE METABOLIC PANEL
ALT: 29 U/L (ref 0–44)
AST: 28 U/L (ref 15–41)
Albumin: 3.8 g/dL (ref 3.5–5.0)
Alkaline Phosphatase: 39 U/L (ref 38–126)
Anion gap: 7 (ref 5–15)
BUN: 32 mg/dL — ABNORMAL HIGH (ref 6–20)
CO2: 27 mmol/L (ref 22–32)
Calcium: 8.7 mg/dL — ABNORMAL LOW (ref 8.9–10.3)
Chloride: 102 mmol/L (ref 98–111)
Creatinine, Ser: 1.45 mg/dL — ABNORMAL HIGH (ref 0.61–1.24)
GFR, Estimated: 56 mL/min — ABNORMAL LOW (ref >60)
Glucose, Bld: 217 mg/dL — ABNORMAL HIGH (ref 70–99)
Potassium: 3.9 mmol/L (ref 3.5–5.1)
Sodium: 136 mmol/L (ref 135–145)
Total Bilirubin: 0.7 mg/dL (ref 0.3–1.2)
Total Protein: 6.5 g/dL (ref 6.5–8.1)

## 2021-05-25 LAB — URINALYSIS, ROUTINE W REFLEX MICROSCOPIC
Bilirubin Urine: NEGATIVE
Glucose, UA: NEGATIVE mg/dL
Hgb urine dipstick: NEGATIVE
Ketones, ur: NEGATIVE mg/dL
Leukocytes,Ua: NEGATIVE
Nitrite: NEGATIVE
Protein, ur: NEGATIVE mg/dL
Specific Gravity, Urine: 1.023 (ref 1.005–1.030)
pH: 6 (ref 5.0–8.0)

## 2021-05-25 LAB — I-STAT CHEM 8, ED
BUN: 36 mg/dL — ABNORMAL HIGH (ref 6–20)
Calcium, Ion: 1.16 mmol/L (ref 1.15–1.40)
Chloride: 100 mmol/L (ref 98–111)
Creatinine, Ser: 1.5 mg/dL — ABNORMAL HIGH (ref 0.61–1.24)
Glucose, Bld: 214 mg/dL — ABNORMAL HIGH (ref 70–99)
HCT: 42 % (ref 39.0–52.0)
Hemoglobin: 14.3 g/dL (ref 13.0–17.0)
Potassium: 4 mmol/L (ref 3.5–5.1)
Sodium: 137 mmol/L (ref 135–145)
TCO2: 28 mmol/L (ref 22–32)

## 2021-05-25 LAB — RESP PANEL BY RT-PCR (FLU A&B, COVID) ARPGX2
Influenza A by PCR: NEGATIVE
Influenza B by PCR: NEGATIVE
SARS Coronavirus 2 by RT PCR: NEGATIVE

## 2021-05-25 LAB — SAMPLE TO BLOOD BANK

## 2021-05-25 LAB — ETHANOL: Alcohol, Ethyl (B): <10 mg/dL (ref <10)

## 2021-05-25 LAB — LACTIC ACID, PLASMA: Lactic Acid, Venous: 1.9 mmol/L (ref 0.5–1.9)

## 2021-05-25 MED ORDER — PRAVASTATIN SODIUM 40 MG PO TABS
40.0000 mg | ORAL_TABLET | Freq: Every day | ORAL | Status: DC
Start: 2021-05-25 — End: 2021-05-29
  Administered 2021-05-25 – 2021-05-28 (×4): 40 mg via ORAL
  Filled 2021-05-25 (×4): qty 1

## 2021-05-25 MED ORDER — AZILSARTAN-CHLORTHALIDONE 40-25 MG PO TABS: 1.0000 | ORAL_TABLET | Freq: Every day | ORAL | Status: DC

## 2021-05-25 MED ORDER — CHLORTHALIDONE 25 MG PO TABS
25.0000 mg | ORAL_TABLET | Freq: Every day | ORAL | Status: DC
  Administered 2021-05-26 – 2021-05-30 (×4): 25 mg via ORAL
  Filled 2021-05-25 (×5): qty 1

## 2021-05-25 MED ORDER — IRBESARTAN 300 MG PO TABS
300.0000 mg | ORAL_TABLET | Freq: Every day | ORAL | Status: DC
  Administered 2021-05-26 – 2021-05-30 (×4): 300 mg via ORAL
  Filled 2021-05-25 (×4): qty 1

## 2021-05-25 MED ORDER — SODIUM CHLORIDE 0.9 % IV BOLUS
500.0000 mL | Freq: Once | INTRAVENOUS | Status: AC
  Administered 2021-05-25: 500 mL via INTRAVENOUS

## 2021-05-25 MED ORDER — OXYCODONE HCL 5 MG PO TABS
10.0000 mg | ORAL_TABLET | ORAL | Status: DC | PRN
  Administered 2021-05-26 – 2021-05-30 (×16): 10 mg via ORAL
  Filled 2021-05-25 (×17): qty 2

## 2021-05-25 MED ORDER — OXYCODONE HCL 5 MG PO TABS
5.0000 mg | ORAL_TABLET | ORAL | Status: DC | PRN
  Administered 2021-05-25 – 2021-05-28 (×9): 5 mg via ORAL
  Filled 2021-05-25 (×10): qty 1

## 2021-05-25 MED ORDER — FENTANYL CITRATE PF 50 MCG/ML IJ SOSY
100.0000 ug | PREFILLED_SYRINGE | Freq: Once | INTRAMUSCULAR | Status: AC
  Administered 2021-05-25: 100 ug via INTRAVENOUS
  Filled 2021-05-25: qty 2

## 2021-05-25 MED ORDER — FENTANYL CITRATE PF 50 MCG/ML IJ SOSY
50.0000 ug | PREFILLED_SYRINGE | INTRAMUSCULAR | Status: DC | PRN
  Administered 2021-05-25: 50 ug via INTRAVENOUS
  Filled 2021-05-25 (×2): qty 1

## 2021-05-25 MED ORDER — ACETAMINOPHEN 500 MG PO TABS
1000.0000 mg | ORAL_TABLET | Freq: Three times a day (TID) | ORAL | Status: DC
  Administered 2021-05-25 – 2021-05-29 (×11): 1000 mg via ORAL
  Filled 2021-05-25 (×11): qty 2

## 2021-05-25 MED ORDER — POTASSIUM CHLORIDE IN NACL 20-0.9 MEQ/L-% IV SOLN
INTRAVENOUS | Status: DC
  Filled 2021-05-25 (×3): qty 1000

## 2021-05-25 MED ORDER — POLYETHYLENE GLYCOL 3350 17 G PO PACK: 17.0000 g | PACK | Freq: Every day | ORAL | Status: DC | PRN

## 2021-05-25 MED ORDER — ONDANSETRON 4 MG PO TBDP
4.0000 mg | ORAL_TABLET | Freq: Four times a day (QID) | ORAL | Status: DC | PRN
Start: 2021-05-25 — End: 2021-05-30

## 2021-05-25 MED ORDER — METHOCARBAMOL 750 MG PO TABS: 750.0000 mg | ORAL_TABLET | Freq: Three times a day (TID) | ORAL | Status: DC | PRN

## 2021-05-25 MED ORDER — ZOLPIDEM TARTRATE 5 MG PO TABS
5.0000 mg | ORAL_TABLET | Freq: Every evening | ORAL | Status: DC | PRN
  Administered 2021-05-25 – 2021-05-29 (×5): 5 mg via ORAL
  Filled 2021-05-25 (×5): qty 1

## 2021-05-25 MED ORDER — FENTANYL CITRATE PF 50 MCG/ML IJ SOSY: 25.0000 ug | PREFILLED_SYRINGE | INTRAMUSCULAR | Status: DC | PRN

## 2021-05-25 MED ORDER — DM-GUAIFENESIN ER 30-600 MG PO TB12
1.0000 | ORAL_TABLET | Freq: Two times a day (BID) | ORAL | Status: DC | PRN
  Administered 2021-05-25 – 2021-05-28 (×4): 1 via ORAL
  Filled 2021-05-25 (×4): qty 1

## 2021-05-25 MED ORDER — INSULIN ASPART 100 UNIT/ML IJ SOLN
0.0000 [IU] | Freq: Three times a day (TID) | INTRAMUSCULAR | Status: DC
  Administered 2021-05-26: 2 [IU] via SUBCUTANEOUS
  Administered 2021-05-26 (×2): 3 [IU] via SUBCUTANEOUS
  Administered 2021-05-27: 18:00:00 2 [IU] via SUBCUTANEOUS
  Administered 2021-05-27: 5 [IU] via SUBCUTANEOUS
  Administered 2021-05-28: 3 [IU] via SUBCUTANEOUS
  Administered 2021-05-29: 2 [IU] via SUBCUTANEOUS
  Administered 2021-05-29: 3 [IU] via SUBCUTANEOUS
  Administered 2021-05-29: 2 [IU] via SUBCUTANEOUS

## 2021-05-25 MED ORDER — TETANUS-DIPHTH-ACELL PERTUSSIS 5-2.5-18.5 LF-MCG/0.5 IM SUSY
0.5000 mL | PREFILLED_SYRINGE | Freq: Once | INTRAMUSCULAR | Status: AC
  Administered 2021-05-25: 0.5 mL via INTRAMUSCULAR
  Filled 2021-05-25: qty 0.5

## 2021-05-25 MED ORDER — DOCUSATE SODIUM 100 MG PO CAPS
100.0000 mg | ORAL_CAPSULE | Freq: Two times a day (BID) | ORAL | Status: DC
  Administered 2021-05-25 – 2021-05-30 (×9): 100 mg via ORAL
  Filled 2021-05-25 (×9): qty 1

## 2021-05-25 MED ORDER — ENOXAPARIN SODIUM 30 MG/0.3ML IJ SOSY
30.0000 mg | PREFILLED_SYRINGE | Freq: Two times a day (BID) | INTRAMUSCULAR | Status: DC
  Administered 2021-05-26 – 2021-05-29 (×5): 30 mg via SUBCUTANEOUS
  Filled 2021-05-25 (×5): qty 0.3

## 2021-05-25 MED ORDER — ONDANSETRON HCL 4 MG/2ML IJ SOLN: 4.0000 mg | Freq: Four times a day (QID) | INTRAMUSCULAR | Status: DC | PRN

## 2021-05-25 MED ORDER — IOHEXOL 300 MG/ML  SOLN
100.0000 mL | Freq: Once | INTRAMUSCULAR | Status: AC | PRN
  Administered 2021-05-25: 100 mL via INTRAVENOUS

## 2021-05-25 NOTE — ED Triage Notes (Signed)
Pt bib [employer] from work where a 125lb chain fell onto pt right shoulder then pt slipped and twisted his left ankle. Pt denies LOC or head trauma. Pt arrives with splint on left ankle with left ankle deformity and right shoulder deformity. PMS intact. Pt AOx4, VSS. ?EMS vitals: initial 90/48 and after NS 134/78, 80HR, 98%RA,  ? fentanyl given en route.  ?

## 2021-05-25 NOTE — Consult Note (Addendum)
ORTHOPAEDIC CONSULTATION ? ?REQUESTING PHYSICIAN: Trifan, Carola Rhine, MD ? ?Chief Complaint: trauma, left ankle, right shoulder injury ? ?HPI: ?Devin Roberts is a 61 y.o. male who was hit earlier today by a piece of heavy machinery causing him to fall and get his left leg stuck between two pallets. He had significant pain and deformity of the left leg. He also had pain in the and soreness in the R shoulder and back. He denies pain in there joints or extremities. ? ?Past Medical History:  ?Diagnosis Date  ? Acute asthmatic bronchitis   ? Allergic rhinitis   ? Anxiety   ? Chronic insomnia   ? Diabetes mellitus   ? DJD (degenerative joint disease)   ? GERD (gastroesophageal reflux disease)   ? Headache(784.0)   ? Hypercholesterolemia   ? Hypertension   ? Obesity   ? ?Past Surgical History:  ?Procedure Laterality Date  ? SHOULDER SURGERY    ? ?Social History  ? ?Socioeconomic History  ? Marital status: Married  ?  Spouse name: Joseph Art  ? Number of children: 0  ? Years of education: Not on file  ? Highest education level: Not on file  ?Occupational History  ? Not on file  ?Tobacco Use  ? Smoking status: Never  ? Smokeless tobacco: Never  ?Substance and Sexual Activity  ? Alcohol use: No  ? Drug use: No  ? Sexual activity: Not on file  ?Other Topics Concern  ? Not on file  ?Social History Narrative  ? Not on file  ? ?Social Determinants of Health  ? ?Financial Resource Strain: Not on file  ?Food Insecurity: Not on file  ?Transportation Needs: Not on file  ?Physical Activity: Not on file  ?Stress: Not on file  ?Social Connections: Not on file  ? ?Family History  ?Problem Relation Age of Onset  ? COPD Father   ? Lung cancer Father   ? Hyperlipidemia Mother   ? Cancer Neg Hx   ? Diabetes Neg Hx   ? Heart disease Neg Hx   ? Hypertension Neg Hx   ? Kidney disease Neg Hx   ? Stroke Neg Hx   ? ?Allergies  ?Allergen Reactions  ? Codeine Itching  ? Lisinopril   ?  REACTION: Allergic to ACE inhibitors w/ cough  ? Simvastatin   ?   REACTION: pt states ZOCOR caused leg cramps  ? ? ? ?Positive ROS: All other systems have been reviewed and were otherwise negative with the exception of those mentioned in the HPI and as above. ? ?Physical Exam: ?General: Alert, no acute distress ?Cardiovascular: No pedal edema ?Respiratory: No cyanosis, no use of accessory musculature ?Skin: No lesions in the area of chief complaint ?Neurologic: Sensation intact distally ?Psychiatric: Patient is competent for consent with normal mood and affect ? ?MUSCULOSKELETAL:  ?LLE Splint c/d/I.  ? Nontender about the knee and thigh ? Calf compartments around splint and soft and compressible ? No groin pain with log roll ? Sens DPN, SPN, TN intact ? Motor EHL, FHL 5/5 ? DP 2+, No significant edema ? ?RLE No traumatic wounds, ecchymosis, or rash ? Nontender ? No groin pain with log roll ? No knee or ankle effusion ? Knee stable to varus/ valgus stress ? Sens DPN, SPN, TN intact ? Motor EHL, ext, flex 5/5 ? DP 2+, PT 2+, No significant edema ? ?RUE No traumatic wounds, ecchymosis, or rash ? Nontender about elbow, forearm, and wrist ?No elbow or wrist effusion ? Sens  median, radial, ulnar intact ? Motor AIN, PIN, IO intact ? Radial pulse 2+, No significant edema ? ?LUE No traumatic wounds, ecchymosis, or rash ? Nontender ?No elbow or wrist effusion ? Sens median, radial, ulnar intact ? Motor AIN, PIN, IO intact ? Radial pulse 2+, No significant edema ? ? ?IMAGING:  ?Xrays and CT scan left tibia and ankle demonstrate displace distal tibial shaft fracture, small displaced posterior mal piece. Displaced proximal fibular shaft fracture. ?CT scan right shoulder shows nondisplaced scapular body fracture. ? ?Assessment: ?Left displaced distal tibia fracture with posterior mal extension, proximal fibular shaft fracture ?Right nondisplaced scapular body fracture ? ?Plan: ?Reviewed injuries and treatment plan with patient and family at bedside. ? ?R scapular body fx- anticipate nonop  treatment. NWB RUE in sling. ? ?L tibia/fibular shaft fx with posterior mal fx- Discussed complex nature of injury involving distal tibia, posterior mal of the ankle, syndesmosis, and proximal fibular shaft. Surgical fixation is recommended. Given proximity to the ankle joint, fixation with plate and screws construct recommended for the distal tibia, with possible need for repair of the posterior mal, sydnesmosis, and/or proximal fibula depending on intraop assessment of stability after the tibia is fixed. Given complex nature of injury, radiographs were reviewed with Dr. Marcelino Scot who will be available to fix the patient's injury on Monday. Patient to be NPO after midnight on Sunday night. Patient will be NWB in splint until surgery. ? ? ? ?Willaim Sheng, MD ? ?Contact information:   ?Weekdays 7am-5pm epic message Dr. Zachery Dakins, or call office for patient follow up: [phone] ?After hours and holidays please check [email or web address] for group call information for [facility]  ?

## 2021-05-25 NOTE — Progress Notes (Signed)
Orthopedic Tech Progress Note ?Patient Details:  ?Devin Roberts ?10-Feb-1961 ?[id number] ? ?Ortho Devices ?Type of Ortho Device: Post (long leg) splint, Stirrup splint ?Ortho Device/Splint Location: lle ?Ortho Device/Splint Interventions: Ordered, Application, Adjustment ? Assisted ED dr with splint. ?Post Interventions ?Patient Tolerated: Well ? ?Al Decant ?05/25/2021, 5:29 PM ? ?

## 2021-05-25 NOTE — H&P (Signed)
CC: I fell between pallets after being struck a work  Requesting provider: Dr Renaye Rakers  HPI: CHASIN DONSBACH is an 61 y.o. male who is here for evaluation after a workplace injury.  They were in the process of moving pallets when he was struck from above on his right shoulder by a piece of the crane and he fell between 2 wooden pallets.  He had obvious injury to his left leg.  He had no loss of consciousness.  He was evaluated by the emergency department and found to have a scapula fracture, L2 vertebral body fracture, and multiple fractures in his left lower extremity and trauma was called to admit the patient.  He denies any head pain or neck pain.  He denies any left upper extremity pain.  He denies any abdominal pain.  He denies any right lower extremity pain.  He does have some minor discomfort in his back.  He denies any numbness or tingling in his legs.  He had a dislocation around his left ankle which has been reduced by orthopedic surgery.  He takes medicine for hypertension and hypercholesterolemia.  He reports that he thought he was developing a head cold over the weekend so he went to urgent care and was given an injection and put on a Z-Pak.  He states that he has been feeling relatively fine the past few days.  He reports normal oral and liquid intake.  Past Medical History:  Diagnosis Date   Acute asthmatic bronchitis    Allergic rhinitis    Anxiety    Chronic insomnia    Diabetes mellitus    DJD (degenerative joint disease)    GERD (gastroesophageal reflux disease)    Headache(784.0)    Hypercholesterolemia    Hypertension    Obesity     Past Surgical History:  Procedure Laterality Date   SHOULDER SURGERY      Family History  Problem Relation Age of Onset   COPD Father    Lung cancer Father    Hyperlipidemia Mother    Cancer Neg Hx    Diabetes Neg Hx    Heart disease Neg Hx    Hypertension Neg Hx    Kidney disease Neg Hx    Stroke Neg Hx     Social:   reports that he has never smoked. He has never used smokeless tobacco. He reports that he does not drink alcohol and does not use drugs.  Allergies:  Allergies  Allergen Reactions   Codeine Itching   Lisinopril     REACTION: Allergic to ACE inhibitors w/ cough   Simvastatin     REACTION: pt states ZOCOR caused leg cramps    Medications: I have reviewed the patient's current medications.   ROS - all of the below systems have been reviewed with the patient and positives are indicated with bold text General: chills, fever or night sweats Eyes: blurry vision or double vision ENT: epistaxis or sore throat; thought he was getting a head cold over the weekend, see hpi Allergy/Immunology: itchy/watery eyes or nasal congestion Hematologic/Lymphatic: bleeding problems, blood clots or swollen lymph nodes Endocrine: temperature intolerance or unexpected weight changes Breast: new or changing breast lumps or nipple discharge Resp: cough, shortness of breath, or wheezing CV: chest pain or dyspnea on exertion GI: as per HPI GU: dysuria, trouble voiding, or hematuria MSK: joint pain or joint stiffness Neuro: TIA or stroke symptoms Derm: pruritus and skin lesion changes Psych: anxiety and depression  PE Blood pressure  124/81, pulse 69, temperature 97.9 F (36.6 C), temperature source Oral, resp. rate 16, height 5\' 8"  (1.727 m), weight 101.2 kg, SpO2 97 %. Constitutional: NAD; conversant; resting comfortably Eyes: Moist conjunctiva; no lid lag; anicteric; PERRL Neck: Trachea midline; no thyromegaly Lungs: Normal respiratory effort; no tactile fremitus CV: RRR; no palpable thrills; no pitting edema GI: Abd soft, nt, nd; no palpable hepatosplenomegaly MSK: RUE - sling, NVI, good pulse, able to move hand/fingers; LLE - splinted, good cap refill, gross sensation in toes intact, calf feels soft;  no clubbing/cyanosis Psychiatric: Appropriate affect; alert and oriented x3 Lymphatic: No palpable  cervical or axillary lymphadenopathy Skin:no rash/lesions  Results for orders placed or performed during the hospital encounter of 05/25/21 (from the past 48 hour(s))  Resp Panel by RT-PCR (Flu A&B, Covid) Nasopharyngeal Swab     Status: None   Collection Time: 05/25/21  2:53 PM   Specimen: Nasopharyngeal Swab; Nasopharyngeal(NP) swabs in vial transport medium  Result Value Ref Range   SARS Coronavirus 2 by RT PCR NEGATIVE NEGATIVE    Comment: (NOTE) SARS-CoV-2 target nucleic acids are NOT DETECTED.  The SARS-CoV-2 RNA is generally detectable in upper respiratory specimens during the acute phase of infection. The lowest concentration of SARS-CoV-2 viral copies this assay can detect is 138 copies/mL. A negative result does not preclude SARS-Cov-2 infection and should not be used as the sole basis for treatment or other patient management decisions. A negative result may occur with  improper specimen collection/handling, submission of specimen other than nasopharyngeal swab, presence of viral mutation(s) within the areas targeted by this assay, and inadequate number of viral copies(<138 copies/mL). A negative result must be combined with clinical observations, patient history, and epidemiological information. The expected result is Negative.  Fact Sheet for Patients:  BloggerCourse.comhttps://www.fda.gov/media/152166/download  Fact Sheet for Healthcare Providers:  SeriousBroker.ithttps://www.fda.gov/media/152162/download  This test is no t yet approved or cleared by the [place] FDA and  has been authorized for detection and/or diagnosis of SARS-CoV-2 by FDA under an Emergency Use Authorization (EUA). This EUA will remain  in effect (meaning this test can be used) for the duration of the COVID-19 declaration under Section 564(b)(1) of the Act, 21 U.S.C.section 360bbb-3(b)(1), unless the authorization is terminated  or revoked sooner.       Influenza A by PCR NEGATIVE NEGATIVE   Influenza B by PCR  NEGATIVE NEGATIVE    Comment: (NOTE) The Xpert Xpress SARS-CoV-2/FLU/RSV plus assay is intended as an aid in the diagnosis of influenza from Nasopharyngeal swab specimens and should not be used as a sole basis for treatment. Nasal washings and aspirates are unacceptable for Xpert Xpress SARS-CoV-2/FLU/RSV testing.  Fact Sheet for Patients: BloggerCourse.comhttps://www.fda.gov/media/152166/download  Fact Sheet for Healthcare Providers: SeriousBroker.ithttps://www.fda.gov/media/152162/download  This test is not yet approved or cleared by the [place] FDA and has been authorized for detection and/or diagnosis of SARS-CoV-2 by FDA under an Emergency Use Authorization (EUA). This EUA will remain in effect (meaning this test can be used) for the duration of the COVID-19 declaration under Section 564(b)(1) of the Act, 21 U.S.C. section 360bbb-3(b)(1), unless the authorization is terminated or revoked.  Performed at [facility], [address] [place], [place] [address]   Comprehensive metabolic panel     Status: Abnormal   Collection Time: 05/25/21  2:53 PM  Result Value Ref Range   Sodium 136 135 - 145 mmol/L   Potassium 3.9 3.5 - 5.1 mmol/L   Chloride 102 98 - 111 mmol/L  CO2 27 22 - 32 mmol/L   Glucose, Bld 217 (H) 70 - 99 mg/dL    Comment: Glucose reference range applies only to samples taken after fasting for at least 8 hours.   BUN 32 (H) 6 - 20 mg/dL   Creatinine, Ser 1.611.45 (H) 0.61 - 1.24 mg/dL   Calcium 8.7 (L) 8.9 - 10.3 mg/dL   Total Protein 6.5 6.5 - 8.1 g/dL   Albumin 3.8 3.5 - 5.0 g/dL   AST 28 15 - 41 U/L   ALT 29 0 - 44 U/L   Alkaline Phosphatase 39 38 - 126 U/L   Total Bilirubin 0.7 0.3 - 1.2 mg/dL   GFR, Estimated 56 (L) >60 mL/min    Comment: (NOTE) Calculated using the CKD-EPI Creatinine Equation (2021)    Anion gap 7 5 - 15    Comment: Performed at [facility], [address] [place], [place] [address]  CBC     Status: Abnormal   Collection Time: 05/25/21  2:53 PM   Result Value Ref Range   WBC 14.6 (H) 4.0 - 10.5 K/uL   RBC 4.36 4.22 - 5.81 MIL/uL   Hemoglobin 14.5 13.0 - 17.0 g/dL   HCT 54.041.2 98.139.0 - 19.152.0 %   MCV 94.5 80.0 - 100.0 fL   MCH 33.3 26.0 - 34.0 pg   MCHC 35.2 30.0 - 36.0 g/dL   RDW 47.812.1 29.511.5 - 62.115.5 %   Platelets 239 150 - 400 K/uL   nRBC 0.0 0.0 - 0.2 %    Comment: Performed at [facility], [address] [place], [place] [address]  Ethanol     Status: None   Collection Time: 05/25/21  2:53 PM  Result Value Ref Range   Alcohol, Ethyl (B) <10 <10 mg/dL    Comment: (NOTE) Lowest detectable limit for serum alcohol is 10 mg/dL.  For medical purposes only. Performed at [facility], [address] [place], [place] [address]   Sample to Blood Bank     Status: None   Collection Time: 05/25/21  2:54 PM  Result Value Ref Range   Blood Bank Specimen SAMPLE AVAILABLE FOR TESTING    Sample Expiration      05/26/2021,2359 Performed at [facility], [address] [place], [place] [address]   I-Stat Chem 8, ED     Status: Abnormal   Collection Time: 05/25/21  3:05 PM  Result Value Ref Range   Sodium 137 135 - 145 mmol/L   Potassium 4.0 3.5 - 5.1 mmol/L   Chloride 100 98 - 111 mmol/L   BUN 36 (H) 6 - 20 mg/dL   Creatinine, Ser 8.411.50 (H) 0.61 - 1.24 mg/dL   Glucose, Bld 324214 (H) 70 - 99 mg/dL    Comment: Glucose reference range applies only to samples taken after fasting for at least 8 hours.   Calcium, Ion 1.16 1.15 - 1.40 mmol/L   TCO2 28 22 - 32 mmol/L   Hemoglobin 14.3 13.0 - 17.0 g/dL   HCT 40.142.0 02.739.0 - 25.352.0 %  Lactic acid, plasma     Status: None   Collection Time: 05/25/21  3:16 PM  Result Value Ref Range   Lactic Acid, Venous 1.9 0.5 - 1.9 mmol/L    Comment: Performed at [facility], [address] [place], [place] [address]  Urinalysis, Routine w reflex microscopic     Status: None   Collection Time: 05/25/21  5:16 PM  Result Value Ref Range   Color, Urine YELLOW YELLOW   APPearance CLEAR  CLEAR   Specific Gravity, Urine 1.023 1.005 - 1.030   pH 6.0 5.0 - 8.0   Glucose, UA NEGATIVE NEGATIVE mg/dL   Hgb urine dipstick NEGATIVE NEGATIVE   Bilirubin Urine NEGATIVE NEGATIVE   Ketones, ur NEGATIVE NEGATIVE mg/dL   Protein, ur NEGATIVE NEGATIVE mg/dL   Nitrite NEGATIVE NEGATIVE   Leukocytes,Ua NEGATIVE NEGATIVE    Comment: Performed at [facility], [address] [place], [place] [address]    DG Chest 1 View  Result Date: 05/25/2021 CLINICAL DATA:  Status post fall, loss of consciousness, head trauma EXAM: CHEST  1 VIEW COMPARISON:  04/16/2016 FINDINGS: The heart size and mediastinal contours are within normal limits. Both lungs are clear. The visualized skeletal structures are unremarkable. IMPRESSION: No active disease. Electronically Signed   By: Elige Ko M.D.   On: 05/25/2021 15:55   DG Shoulder Right  Result Date: 05/25/2021 CLINICAL DATA:  125lb chain fell onto pt right shoulder then pt slipped and twisted his left ankle. Pt denies LOC or head trauma. Pt arrives with splint on left ankle with left ankle deformity and right shoulder deformity. Pt c/o left foot, right shoulder, left ankle, and left lower leg pain. No hx of prior injuries or surgeries. EXAM: RIGHT SHOULDER - 2+ VIEW COMPARISON:  None. FINDINGS: Nondisplaced fracture of the scapula, extending obliquely from the lateral border 1-2 cm below the glenoid, to the superior scapula near the scapular notch. No other fractures.  No bone lesions. Glenohumeral and AC joints are normally spaced and aligned. IMPRESSION: 1. Nondisplaced fracture of the right scapula. 2. No other fractures.  No dislocation. Electronically Signed   By: Amie Portland M.D.   On: 05/25/2021 15:57   DG Tibia/Fibula Left  Result Date: 05/25/2021 CLINICAL DATA:  Injury, heavy chain fell onto patient, patient fell and twisted ankle EXAM: LEFT ANKLE - 2 VIEW; LEFT TIBIA AND FIBULA - 2 VIEW COMPARISON:  None. FINDINGS: Displaced oblique fracture of  the proximal left fibular diaphysis. No fracture or dislocation of the proximal tibia. Grossly displaced oblique fracture of the distal left tibial metadiaphysis, with an additional displaced fracture of the posterior malleolus. No fracture of the distal fibula. IMPRESSION: 1. Displaced oblique fracture of the proximal left fibular diaphysis. 2. Grossly displaced oblique fracture of the distal left tibial metadiaphysis, with an additional displaced fracture of the posterior malleolus. 3. No fracture of the distal fibula. Electronically Signed   By: Jearld Lesch M.D.   On: 05/25/2021 15:58   DG Ankle 2 Views Left  Result Date: 05/25/2021 CLINICAL DATA:  Fracture reduction EXAM: LEFT ANKLE - 2 VIEW COMPARISON:  05/25/2021 FINDINGS: Frontal, oblique, lateral views of the left ankle are obtained. Cast material obscures underlying bony detail. The oblique distal tibial metaphyseal fracture is again identified, with 1.5 cm of lateral displacement and 0.7 cm of dorsal displacement of the distal fracture fragment remaining. Near anatomic alignment of the posterior malleolar fracture seen previously. The ankle mortise is intact. There is diffuse soft tissue swelling. IMPRESSION: 1. Improved alignment of the distal tibial fracture, with residual dorsal and lateral displacement of the distal fracture fragment as above. 2. Near anatomic alignment of a posterior malleolar fracture. 3. Casting material obscures underlying bony detail. Electronically Signed   By: Sharlet Salina M.D.   On: 05/25/2021 17:51   DG Ankle 2 Views Left  Result Date: 05/25/2021 CLINICAL DATA:  Injury, heavy chain fell onto patient, patient fell and twisted ankle EXAM: LEFT ANKLE - 2 VIEW; LEFT TIBIA AND FIBULA - 2 VIEW COMPARISON:  None. FINDINGS: Displaced oblique fracture of the proximal left fibular diaphysis. No fracture or dislocation of the proximal tibia. Grossly displaced oblique fracture of the distal left tibial metadiaphysis, with an  additional displaced fracture of the posterior malleolus. No fracture of the distal fibula. IMPRESSION: 1. Displaced oblique fracture of the proximal left fibular diaphysis. 2. Grossly displaced oblique fracture of the distal left tibial metadiaphysis, with an additional displaced fracture of the posterior malleolus. 3. No fracture of the distal fibula. Electronically Signed   By: Jearld Lesch M.D.   On: 05/25/2021 15:58   CT CERVICAL SPINE WO CONTRAST  Result Date: 05/25/2021 CLINICAL DATA:  Injury.  Facial trauma. EXAM: CT CERVICAL SPINE WITHOUT CONTRAST TECHNIQUE: Multidetector CT imaging of the cervical spine was performed without intravenous contrast. Multiplanar CT image reconstructions were also generated. RADIATION DOSE REDUCTION: This exam was performed according to the departmental dose-optimization program which includes automated exposure control, adjustment of the mA and/or kV according to patient size and/or use of iterative reconstruction technique. COMPARISON:  None. FINDINGS: Alignment: Normal except for minimal anterolisthesis of C3 on C4. Skull base and vertebrae: No acute fracture. No primary bone lesion or focal pathologic process. Soft tissues and spinal canal: No prevertebral fluid or swelling. No visible canal hematoma. Disc levels: Mild left foraminal narrowing at C3-C4 due to uncovertebral spurring. Severe facet arthropathy on the left side of C3-C4. Mild disc space narrowing and endplate changes C6-C7. Upper chest: Not imaged Other: None IMPRESSION: 1. No acute bone abnormality in the cervical spine. 2. Mild degenerative changes particularly along the left side of C3-C4. Electronically Signed   By: Richarda Overlie M.D.   On: 05/25/2021 16:38   CT CHEST ABDOMEN PELVIS W CONTRAST  Result Date: 05/25/2021 CLINICAL DATA:  Work injury. 125 pound chain fell on his right shoulder. EXAM: CT CHEST, ABDOMEN, AND PELVIS WITH CONTRAST TECHNIQUE: Multidetector CT imaging of the chest, abdomen and  pelvis was performed following the standard protocol during bolus administration of intravenous contrast. RADIATION DOSE REDUCTION: This exam was performed according to the departmental dose-optimization program which includes automated exposure control, adjustment of the mA and/or kV according to patient size and/or use of iterative reconstruction technique. CONTRAST:  OMNIPAQUE IOHEXOL 300 MG/ML  SOLN COMPARISON:  CT abdomen pelvis dated January 25, 2017. FINDINGS: CT CHEST FINDINGS Cardiovascular: No significant vascular findings. Normal heart size. No pericardial effusion. Mediastinum/Nodes: No enlarged mediastinal, hilar, or axillary lymph nodes. Thyroid gland, trachea, and esophagus demonstrate no significant findings. Lungs/Pleura: Minimal subsegmental atelectasis in both posterior lower lobes. No focal consolidation, pleural effusion, or pneumothorax. Musculoskeletal: Acute comminuted minimally displaced fracture of the right scapular body and spine. No extension to the glenoid. No additional fracture. CT ABDOMEN PELVIS FINDINGS Hepatobiliary: No focal liver abnormality is seen. No gallstones, gallbladder wall thickening, or biliary dilatation. Pancreas: Unremarkable. No pancreatic ductal dilatation or surrounding inflammatory changes. Spleen: Normal in size without focal abnormality. Adrenals/Urinary Tract: Adrenal glands are unremarkable. Kidneys are normal, without renal calculi, focal lesion, or hydronephrosis. Bladder is unremarkable. Stomach/Bowel: Stomach is within normal limits. Appendix appears normal. No evidence of bowel wall thickening, distention, or inflammatory changes. Vascular/Lymphatic: No significant vascular findings are present. No enlarged abdominal or pelvic lymph nodes. Reproductive: Prostate is unremarkable. Other: No abdominal wall hernia or abnormality. No abdominopelvic ascites. No pneumoperitoneum. Musculoskeletal: Acute comminuted mildly  depressed fracture of the L2  vertebral body without posterior involvement. No retropulsion. Acute nondisplaced longitudinal fracture through the right posterolateral L1 inferior endplate (series 6, image 127). IMPRESSION: 1. Acute comminuted minimally displaced fracture of the right scapular body and spine. 2. Acute comminuted mildly depressed anterior column fracture of the L2 vertebral body. 3. Acute nondisplaced longitudinal fracture through the right posterolateral L1 inferior endplate. 4.  No acute intrathoracic or intra-abdominal traumatic injury. Electronically Signed   By: Obie Dredge M.D.   On: 05/25/2021 16:42   DG Foot 2 Views Left  Result Date: 05/25/2021 CLINICAL DATA:  Twisted ankle EXAM: LEFT FOOT - 2 VIEW COMPARISON:  Left foot radiographs 06/30/2014 FINDINGS: The distal tibial fracture is better assessed on the dedicated ankle radiographs. No additional fracture of the foot is seen. Alignment appears normal. There is no erosive change. The soft tissues of the foot are unremarkable. IMPRESSION: No acute fracture or dislocation of the foot. Please also see separately dictated ankle radiographs. Electronically Signed   By: Lesia Hausen M.D.   On: 05/25/2021 16:03    Imaging: Personally reviewed  A/P: ORLONDO HOLYCROSS is an 61 y.o. male  With workplace fall/injury Right scapula fx L2 vertebral body fx L1 inferior endplate fx Left fibular fx Left tibia fx Left malleolus fx Acute kidney injury  Hyperglycemia Htn HPL  Admit to inpatient status to trauma service Orthopedics has seen the patient-Dr. Gwendel Hanson the dislocation.  Given the complexity of the orthopedic injury he feels the patient would be best served by the orthopedic trauma service.  the first availability to operate will be Monday.  EDP has also consulted neurosurgery -to discuss management of the vertebral body fracture.  In the interim patient will be bedrest.  Likely will be treated with a TLSO brace but will await their  recommendation.  Continue home medications-we will need to monitor his renal function since he is on ARB  SCD and chemical VTE prophylaxis, will again need to monitor his creatinine given his AKI and Lovenox  IV fluids Pain control Trend blood sugars since he had some hyperglycemia on admission.  This required a moderate level of medical decision making.  I reviewed his imaging, his labs, his other historical labs, his most recent PCP office note, discussed the case with the EDP as well as with the orthopedic surgeon  Mary Sella. Andrey Campanile, MD, FACS General, Bariatric, & Minimally Invasive Surgery [facility], [place]

## 2021-05-25 NOTE — ED Provider Notes (Signed)
?[facility] EMERGENCY DEPARTMENT ?Provider Note ? ? ?CSN: [id number] ?Arrival date & time: 05/25/21  1442 ? ?  ? ?History ? ?Chief Complaint  ?Patient presents with  ?? Fall  ? ? ?Devin Roberts is a 61 y.o. male. ? ? ?Fall ? ?Patient is a 61 year old male, he has a history of hypertension and hypercholesterolemia.  He [place] the hospital [place] he had a very heavy piece of machinery about 150 pounds fall onto his shoulder.  This caused him to go to the ground but twisted awkwardly which caused an acute injury to his left ankle with deformity and loss of anatomic structure in his left ankle.  Paramedics were called and found the patient to be nonambulatory with a severely deformed ankle.  They put him in an immobilizing splint and gave him fentanyl prehospital which helped.  There was no head injury, he has no numbness or weakness.  Vascular status was intact prehospital ?  ? ?Home Medications ?Prior to Admission medications   ?Medication Sig Start Date End Date Taking? Authorizing Provider  ?aspirin EC 81 MG tablet Take 1 tablet (81 mg total) by mouth daily. 05/15/15   Etta Grandchild, MD  ?Cholecalciferol 50 MCG (2000 UT) TABS Take 2 tablets (4,000 Units total) by mouth daily. 01/18/20   Etta Grandchild, MD  ?EDARBYCLOR 40-25 MG TABS Take 1 tablet by mouth daily. 04/05/21   Etta Grandchild, MD  ?icosapent Ethyl (VASCEPA) 1 g capsule TAKE (2) CAPSULES BY MOUTH TWICE DAILY. 10/10/20   Etta Grandchild, MD  ?meloxicam (MOBIC) 15 MG tablet TAKE ONE TABLET BY MOUTH DAILY 05/03/21   Etta Grandchild, MD  ?pravastatin (PRAVACHOL) 40 MG tablet Take 1 tablet (40 mg total) by mouth at bedtime. 10/10/20   Etta Grandchild, MD  ?zolpidem (AMBIEN) 10 MG tablet TAKE ONE TABLET BY MOUTH AT BEDTIME AS NEEDED FOR SLEEP 04/05/21   Etta Grandchild, MD  ?   ? ?Allergies    ?Codeine, Lisinopril, and Simvastatin   ? ?Review of Systems   ?Review of Systems  ?All other systems  reviewed and are negative. ? ?Physical Exam ?Updated Vital Signs ?Ht 1.727 m (5\' 8" )   Wt 101.2 kg   BMI 33.91 kg/m?  ?Physical Exam ?Vitals and nursing note reviewed.  ?Constitutional:   ?   General: He is in acute distress.  ?   Appearance: He is well-developed.  ?HENT:  ?   Head: Normocephalic and atraumatic.  ?   Mouth/Throat:  ?   Pharynx: No oropharyngeal exudate.  ?Eyes:  ?   General: No scleral icterus.    ?   Right eye: No discharge.     ?   Left eye: No discharge.  ?   Conjunctiva/sclera: Conjunctivae normal.  ?   Pupils: Pupils are equal, round, and reactive to light.  ?Neck:  ?   Thyroid: No thyromegaly.  ?   Vascular: No JVD.  ?Cardiovascular:  ?   Rate and Rhythm: Normal rate and regular rhythm.  ?   Heart sounds: Normal heart sounds. No murmur heard. ?  No friction rub. No gallop.  ?Pulmonary:  ?   Effort: Pulmonary effort is normal. No respiratory distress.  ?   Breath sounds: Normal breath sounds. No wheezing or rales.  ?Abdominal:  ?   General: Bowel sounds are normal. There is no distension.  ?   Palpations: Abdomen is soft. There is no  mass.  ?   Tenderness: There is no abdominal tenderness.  ?Musculoskeletal:     ?   General: Swelling, tenderness, deformity and signs of injury present.  ?   Cervical back: Normal range of motion and neck supple.  ?   Comments: There is some decreased range of motion of the right shoulder secondary to tenderness, there is what appears to be a contusion on the top of the shoulder.  There is no injury to the right upper extremity below the shoulder or the left upper extremity in its entirety.  Left lower extremity has an obvious deformity at the ankle with loss of structural integrity.  There is normal pulses at the foot.  Right lower extremity without any deformity or tenderness.  ?Lymphadenopathy:  ?   Cervical: No cervical adenopathy.  ?Skin: ?   General: Skin is warm and dry.  ?   Findings: No erythema or rash.  ?Neurological:  ?   Mental Status: He is alert.   ?   Coordination: Coordination normal.  ?   Comments: Totally normal mental status, using all 4 extremities per normal strength and sensation and coordination  ?Psychiatric:     ?   Behavior: Behavior normal.  ? ? ?ED Results / Procedures / Treatments   ?Labs ?(all labs ordered are listed, but only abnormal results are displayed) ?Labs Reviewed  ?RESP PANEL BY RT-PCR (FLU A&B, COVID) ARPGX2  ?COMPREHENSIVE METABOLIC PANEL  ?CBC  ?ETHANOL  ?URINALYSIS, ROUTINE W REFLEX MICROSCOPIC  ?LACTIC ACID, PLASMA  ?PROTIME-INR  ?I-STAT CHEM 8, ED  ?SAMPLE TO BLOOD BANK  ? ? ?EKG ?None ? ?Radiology ?No results found. ? ?Procedures ?Procedures  ? ? ?Medications Ordered in ED ?Medications  ?sodium chloride 0.9 % bolus 500 mL (has no administration in time range)  ?fentaNYL (SUBLIMAZE) injection 50 mcg (has no administration in time range)  ?Tdap (BOOSTRIX) injection 0.5 mL (has no administration in time range)  ? ? ?ED Course/ Medical Decision Making/ A&P ?Clinical Course as of 05/25/21 1506  ?Fri May 25, 2021  ?[age] yo male presenting with workplace injury, reporting he was struck in the right shoulder by  [MT]  ?  ?Clinical Course User Index ?[MT] Terald Sleeper, MD  ? ?                        ?Medical Decision Making ?Amount and/or Complexity of Data Reviewed ?Labs: ordered. ?Radiology: ordered. ? ?Risk ?Prescription drug management. ? ? ?This patient presents to the ED for concern of trauma including significant injury to the left ankle., this involves an extensive number of treatment options, and is a complaint that carries with it a high risk of complications and morbidity.  The differential diagnosis includes ankle fracture, suspect bimalleolar trimalar fracture.  This could be distal tibial, this will likely need to be set and orthopedic consultation obtained.  Additionally the patient has multiple injuries of the shoulder and thorax and will need CT scans of the chest abdomen and pelvis as well as the cervical  spine. ? ? ?Co morbidities that complicate the patient evaluation ? ?Trauma, acute injury, distracting injury ? ? ?Additional history obtained: ? ?Additional history obtained from EMS and paramedics ?External records from outside source obtained and reviewed including paramedic logs as well as pain medicines given prehospital ? ? ?Lab Tests: ? ?I Ordered, and personally interpreted labs.  The pertinent results include: Trauma labs ? ? ?Imaging Studies ordered: ? ?I  ordered imaging studies including CT scan of the cervical spine as well as the chest abdomen and pelvis and plain films of the shoulder ankle and foot and tib-fib ? ? ?Cardiac Monitoring: ? ?The patient was maintained on a cardiac monitor.  I personally viewed and interpreted the cardiac monitored which showed an underlying rhythm of: Normal sinus rhythm ? ? ?Medicines ordered and prescription drug management: ? ?I ordered medication including fentanyl for pain as needed ?Reevaluation of the patient after these medicines showed that the patient improved ?I have reviewed the patients home medicines and have made adjustments as needed ? ? ?Test Considered: ? ?CT scan of the head however the patient has absolutely no head injury or loss of consciousness or altered mental status ? ? ?Critical Interventions: ? ?Pain control, evaluation for traumatic injuries ? ? ?Consultations Obtained: ? ?Change of shift, care will be signed out to oncoming physician to follow-up results and disposition accordingly, appreciate Dr. Renaye Rakers ? ? ?Problem List / ED Course: ? ?Traunma - see change of shift notes ? ? ? ?Social Determinants of Health: ? ?None ? ? ? ? ? ?  ? ? ? ? ?Final Clinical Impression(s) / ED Diagnoses ?Final diagnoses:  ?Trauma  ? ? ?Rx / DC Orders ?ED Discharge Orders   ? ? None  ? ?  ? ? ?  ?Eber Hong, MD ?05/25/21 1506 ? ?

## 2021-05-25 NOTE — ED Provider Notes (Signed)
Clinical Course as of 05/25/21 1604  ?Fri May 25, 2021  ?[age] yo male presenting with workplace injury, reporting he was struck in the right shoulder by a "hoyer" bar estimated 100-125 lbs, which spun him around and twisted his left ankle.  Deformity to left ankle.  Placed in brace by EMS.  Neurovascularly intact on arrival.  He has ttp of right humeral head and limited ROM to to shoulder pain.  No head injury or signs of head trauma.  Not on A/C.  Pending imaging, reassessment.  Pain currently controlled with IV fentanyl.  Received as signout from Dr Hyacinth Meeker EDP [MT]  ?  ?Clinical Course User Index ?[MT] Terald Sleeper, MD  ? ?Xrays reviewed and interpreted, agree with radiologist ?Left proximal fibula fx, distal tibial fx, posterior malleolus ?Right scapula fx, nondisplaced ? ?Labs personally reviewed, showing some mild elevation of BUN/Cr, likely related to poor hydration, pt receiving IV fluids already on arrival.  WBC elevated, likely reactive leukocytosis - doubt infection/sepsis. ? ?No compartment syndrome symptoms or signs on exam ? ?Distal pulses intact on exam.  Neurological function intact. ? ?405 pm- ortho consult placed ? ?Dr Blanchie Dessert recommended an attempted bedside reduction for tibia, which was performed with fentanyl 100 mcg, attempted vigorous traction but no significant change in dislocation on repeat imaging.  Pt remains neurovascualrly intact, now in long posterior leg splint to stabilize leg fractures. ? ?CT imaging reviewed and notable for L1-L2 compression fx, unlikely surgical emergency.  Pt may need TLSO brace while in hospital, but remains bed-bound and supine now, and bracing may be difficult with shoulder injury.   ? ?Patient admitted to Dr Janee Morn trauma service - NPO at midnight for possible OR with orthopedics tomorrow. ?  ?Terald Sleeper, MD ?05/25/21 1747 ? ?

## 2021-05-25 NOTE — Progress Notes (Signed)
Called in regards to this patients Ct scan which shows a vertical fracture through the L1 vertebral body and a mild compression fracture of L2. TLSO brace for now. Formal consult to follow.  ?

## 2021-05-25 NOTE — Progress Notes (Signed)
Trauma Event Note ? ? ? ?Reason for Call :   ?Pt being admitted to trauma services ? ?Initial Focused Assessment: ?Pt is alert/oriented x 4, is having ankle reduced by Dr. Renaye Rakers. Primary RN at bedside.  ? ?Interventions: ?Fentanyl given, Ortho techs at bedside, Dr. Renaye Rakers reduced ankle without difficulty. Splint placed per ortho techs ? ?Plan of Care: ?Admit to trauma, ortho consult.  ? ? ?Last imported Vital Signs ?BP (!) 170/91   Pulse 71   Temp 97.9 ?F (36.6 ?C) (Oral)   Resp 17   Ht 5\' 8"  (1.727 m)   Wt 223 lb (101.2 kg)   SpO2 100%   BMI 33.91 kg/m?  ? ?Trending CBC ?Recent Labs  ?  05/25/21 ?1453 05/25/21 ?1505  ?WBC 14.6*  --   ?HGB 14.5 14.3  ?HCT 41.2 42.0  ?PLT 239  --   ? ? ?Trending Coag's ?No results for input(s): APTT, INR in the last 72 hours. ? ?Trending BMET ?Recent Labs  ?  05/25/21 ?1453 05/25/21 ?1505  ?NA 136 137  ?K 3.9 4.0  ?CL 102 100  ?CO2 27  --   ?BUN 32* 36*  ?CREATININE 1.45* 1.50*  ?GLUCOSE 217* 214*  ? ? ? ? ?07/25/21 Naara Kelty  ?Trauma Response RN ? ?Please call TRN at [phone] for further assistance. ? ? ?  ?

## 2021-05-26 ENCOUNTER — Encounter (HOSPITAL_COMMUNITY): Payer: Self-pay

## 2021-05-26 LAB — BASIC METABOLIC PANEL
Anion gap: 11 (ref 5–15)
BUN: 29 mg/dL — ABNORMAL HIGH (ref 6–20)
CO2: 20 mmol/L — ABNORMAL LOW (ref 22–32)
Calcium: 8.4 mg/dL — ABNORMAL LOW (ref 8.9–10.3)
Chloride: 104 mmol/L (ref 98–111)
Creatinine, Ser: 1.1 mg/dL (ref 0.61–1.24)
GFR, Estimated: >60 mL/min (ref >60)
Glucose, Bld: 151 mg/dL — ABNORMAL HIGH (ref 70–99)
Potassium: 3.7 mmol/L (ref 3.5–5.1)
Sodium: 135 mmol/L (ref 135–145)

## 2021-05-26 LAB — HEMOGLOBIN A1C
Hgb A1c MFr Bld: 6.5 % — ABNORMAL HIGH (ref 4.8–5.6)
Mean Plasma Glucose: 139.85 mg/dL

## 2021-05-26 LAB — CBC
HCT: 38.4 % — ABNORMAL LOW (ref 39.0–52.0)
Hemoglobin: 13.7 g/dL (ref 13.0–17.0)
MCH: 32.9 pg (ref 26.0–34.0)
MCHC: 35.7 g/dL (ref 30.0–36.0)
MCV: 92.3 fL (ref 80.0–100.0)
Platelets: 183 10*3/uL (ref 150–400)
RBC: 4.16 MIL/uL — ABNORMAL LOW (ref 4.22–5.81)
RDW: 12.1 % (ref 11.5–15.5)
WBC: 13.2 10*3/uL — ABNORMAL HIGH (ref 4.0–10.5)
nRBC: 0 % (ref 0.0–0.2)

## 2021-05-26 LAB — GLUCOSE, CAPILLARY
Glucose-Capillary: 152 mg/dL — ABNORMAL HIGH (ref 70–99)
Glucose-Capillary: 151 mg/dL — ABNORMAL HIGH (ref 70–99)
Glucose-Capillary: 139 mg/dL — ABNORMAL HIGH (ref 70–99)
Glucose-Capillary: 156 mg/dL — ABNORMAL HIGH (ref 70–99)
Glucose-Capillary: 159 mg/dL — ABNORMAL HIGH (ref 70–99)

## 2021-05-26 LAB — HIV ANTIBODY (ROUTINE TESTING W REFLEX): HIV Screen 4th Generation wRfx: NONREACTIVE

## 2021-05-26 MED ORDER — METHOCARBAMOL 750 MG PO TABS
750.0000 mg | ORAL_TABLET | Freq: Three times a day (TID) | ORAL | Status: DC
  Administered 2021-05-26 – 2021-05-29 (×9): 750 mg via ORAL
  Filled 2021-05-26 (×10): qty 1

## 2021-05-26 MED ORDER — MORPHINE SULFATE (PF) 2 MG/ML IV SOLN
2.0000 mg | INTRAVENOUS | Status: DC | PRN
  Administered 2021-05-26: 14:00:00 2 mg via INTRAVENOUS
  Filled 2021-05-26: qty 1

## 2021-05-26 NOTE — Progress Notes (Signed)
Inpatient Rehab Admissions Coordinator:  ? ?Per therapy recommendations,  patient was screened for CIR candidacy by Megan Salon, MS, CCC-SLP. At this time, Pt. Scheduled for additional surgery 3/6. I will rescreen after that.  ? ? ?Megan Salon, MS, CCC-SLP ?Rehab Admissions Coordinator  ?[phone] (celll) ?[phone] (office) ? ?

## 2021-05-26 NOTE — TOC CAGE-AID Note (Signed)
Transition of Care (TOC) - CAGE-AID Screening ? ? ?Patient Details  ?Name: Devin Roberts ?MRN: [id number] ?Date of Birth: 07/09/1960 ? ?Transition of Care (TOC) CM/SW Contact:    ?Katha Hamming, RN ?Phone Number:[phone] ?05/26/2021, 8:42 PM ? ? ?Clinical Narrative: ? ?Patient presents to the hospital after a work-related accident resulting in R scapula fx, L1-2 fx, L tib/fib fx, L malleolus fx. Reports no alcohol and drug use, no resources indicated. ? ?CAGE-AID Screening: ?  ? ?Have You Ever Felt You Ought to Cut Down on Your Drinking or Drug Use?: No ?Have People Annoyed You By Critizing Your Drinking Or Drug Use?: No ?Have You Felt Bad Or Guilty About Your Drinking Or Drug Use?: No ?Have You Ever Had a Drink or Used Drugs First Thing In The Morning to Steady Your Nerves or to Get Rid of a Hangover?: No ?CAGE-AID Score: 0 ? ?Substance Abuse Education Offered: No ? ?  ? ? ? ? ? ? ?

## 2021-05-26 NOTE — Progress Notes (Signed)
PT Cancellation Note ? ?Patient Details ?Name: Devin Roberts ?MRN: [id number] ?DOB: 05-12-1960 ? ? ?Cancelled Treatment:    Reason Eval/Treat Not Completed: Other (comment) (await tLSO) ? ? ?[name] ?05/26/2021, 11:24 AM ?[name], PT ?Acute Rehabilitation Services ?Pager: [phone] ?Office: [phone] ? ?

## 2021-05-26 NOTE — Evaluation (Signed)
Physical Therapy Evaluation ?Patient Details ?Name: Devin Roberts ?MRN: [id number] ?DOB: [birth year] ?Today's Date: 05/26/2021 ? ?History of Present Illness ? 61 yo admitted 3/3 after workplace accident where crane fell on his shoulder and he fell between 2 pallets. Pt with L1-2 fx, complex LLE fx await sx 3/6, Rt scapula fx. PMhx: HTN, HLD, DM, anxiety, obesity  ?Clinical Impression ? Pt very pleasant and willing to attempt mobility. Pt educated for brace and sling wear and donning. Pt with decreased activity tolerance, strength and function given multiple injuries but able to stand with brace this session. Pt with assist of wife at home and 2 steps to enter. Pt will benefit from acute therapy to maximize mobility, safety and function to decrease burden of care.    ?   ? ?Recommendations for follow up therapy are one component of a multi-disciplinary discharge planning process, led by the attending physician.  Recommendations may be updated based on patient status, additional functional criteria and insurance authorization. ? ?Follow Up Recommendations Acute inpatient rehab (3hours/day) ? ?  ?Assistance Recommended at Discharge    ?Patient can return home with the following ? A lot of help with walking and/or transfers;A lot of help with bathing/dressing/bathroom;Help with stairs or ramp for entrance;Assist for transportation;Assistance with cooking/housework ? ?  ?Equipment Recommendations Wheelchair (measurements PT);BSC/3in1;Wheelchair cushion (measurements PT)  ?Recommendations for Other Services ?    ?  ?Functional Status Assessment Patient has had a recent decline in their functional status and demonstrates the ability to make significant improvements in function in a reasonable and predictable amount of time.  ? ?  ?Precautions / Restrictions Precautions ?Precautions: Back;Fall ?Precaution Comments: sling RUE ?Required Braces or Orthoses: Spinal Brace;Sling ?Spinal Brace: Thoracolumbosacral orthotic;Applied in  supine position ?Restrictions ?Weight Bearing Restrictions: Yes ?RUE Weight Bearing: Non weight bearing ?LLE Weight Bearing: Non weight bearing  ? ?  ? ?Mobility ? Bed Mobility ?Overal bed mobility: Needs Assistance ?Bed Mobility: Rolling, Sidelying to Sit, Sit to Supine ?Rolling: Min assist ?Sidelying to sit: Mod assist, +2 for physical assistance ?  ?Sit to supine: Mod assist, +2 for physical assistance ?  ?General bed mobility comments: pt able to roll toward left with use of RUE on rail and min assist of pad x 2 to don/doff brace. Side to sit with physical assist to clear legs off bed and elevate trunk as well as bring legs back to surface with return to supine ?  ? ?Transfers ?Overall transfer level: Needs assistance ?  ?Transfers: Sit to/from Stand ?Sit to Stand: Mod assist, +2 physical assistance ?  ?  ?  ?  ?  ?General transfer comment: pt able to hook LUE and stand on RLE with LLE propped on P.T. foot to monitor NWB status. PT stood x 2 and able to scoot/walk right foot to "step" toward HOB. ?  ? ?Ambulation/Gait ?  ?  ?  ?  ?  ?  ?  ?General Gait Details: unable ? ?Stairs ?  ?  ?  ?  ?  ? ?Wheelchair Mobility ?  ? ?Modified Rankin (Stroke Patients Only) ?  ? ?  ? ?Balance Overall balance assessment: Needs assistance ?  ?Sitting balance-Leahy Scale: Good ?Sitting balance - Comments: EOb without physical assist ?  ?Standing balance support: Single extremity supported ?  ?Standing balance comment: reliant on assist to maintain NWB LLE ?  ?  ?  ?  ?  ?  ?  ?  ?  ?  ?  ?   ? ? ? ?  Pertinent Vitals/Pain Pain Assessment ?Pain Assessment: 0-10 ?Pain Score: 6  ?Pain Location: LLE and Rt shoulder ?Pain Descriptors / Indicators: Guarding, Constant, Aching ?Pain Intervention(s): Limited activity within patient's tolerance, Monitored during session, Repositioned, Premedicated before session, Patient requesting pain meds-RN notified  ? ? ?Home Living Family/patient expects to be discharged to:: Private residence ?Living  Arrangements: Spouse/significant other ?Available Help at Discharge: Family;Available 24 hours/day ?Type of Home: House ?Home Access: Stairs to enter ?  ?Entrance Stairs-Number of Steps: 2 ?  ?Home Layout: Two level;Able to live on main level with bedroom/bathroom ?Home Equipment: None ?Additional Comments: adjustable bed. Pt reports wide doorways that are WC accessible  ?  ?Prior Function Prior Level of Function : Independent/Modified Independent ?  ?  ?  ?  ?  ?  ?  ?  ?  ? ? ?Hand Dominance  ?   ? ?  ?Extremity/Trunk Assessment  ? Upper Extremity Assessment ?Upper Extremity Assessment: RUE deficits/detail ?RUE Deficits / Details: in sling and did not attempt AROM ?  ? ?Lower Extremity Assessment ?Lower Extremity Assessment: LLE deficits/detail ?LLE Deficits / Details: immobilized with splint and able to maintain NWB, WFL for hip flexion ?  ? ?Cervical / Trunk Assessment ?Cervical / Trunk Assessment: Other exceptions ?Cervical / Trunk Exceptions: TLSO  ?Communication  ? Communication: No difficulties  ?Cognition Arousal/Alertness: Awake/alert ?Behavior During Therapy: [facility] for tasks assessed/performed ?Overall Cognitive Status: Within Functional Limits for tasks assessed ?  ?  ?  ?  ?  ?  ?  ?  ?  ?  ?  ?  ?  ?  ?  ?  ?  ?  ?  ? ?  ?General Comments   ? ?  ?Exercises    ? ?Assessment/Plan  ?  ?PT Assessment Patient needs continued PT services  ?PT Problem List Decreased strength;Decreased mobility;Decreased coordination;Decreased range of motion;Decreased activity tolerance;Decreased skin integrity;Decreased balance;Decreased knowledge of use of DME;Pain ? ?   ?  ?PT Treatment Interventions DME instruction;Therapeutic exercise;Balance training;Wheelchair mobility training;Functional mobility training;Therapeutic activities;Patient/family education   ? ?PT Goals (Current goals can be found in the Care Plan section)  ?Acute Rehab PT Goals ?Patient Stated Goal: return to work and hunting ?PT Goal Formulation: With  patient ?Time For Goal Achievement: 06/09/21 ?Potential to Achieve Goals: Good ? ?  ?Frequency Min 5X/week ?  ? ? ?Co-evaluation   ?  ?  ?  ?  ? ? ?  ?AM-PAC PT "6 Clicks" Mobility  ?Outcome Measure Help needed turning from your back to your side while in a flat bed without using bedrails?: A Little ?Help needed moving from lying on your back to sitting on the side of a flat bed without using bedrails?: Total ?Help needed moving to and from a bed to a chair (including a wheelchair)?: Total ?Help needed standing up from a chair using your arms (e.g., wheelchair or bedside chair)?: Total ?Help needed to walk in hospital room?: Total ?Help needed climbing 3-5 steps with a railing? : Total ?6 Click Score: 8 ? ?  ?End of Session Equipment Utilized During Treatment: Back brace;Other (comment) (sling) ?Activity Tolerance: Patient tolerated treatment well ?Patient left: in bed;with call bell/phone within reach ?Nurse Communication: Mobility status ?PT Visit Diagnosis: Other abnormalities of gait and mobility (R26.89);Difficulty in walking, not elsewhere classified (R26.2);Pain ?Pain - Right/Left: Left ?Pain - part of body: Leg ?  ? ?Time: 5929-2446 ?PT Time Calculation (min) (ACUTE ONLY): 25 min ? ? ?Charges:   PT Evaluation ?$  PT Eval Moderate Complexity: 1 Mod ?PT Treatments ?$Therapeutic Activity: 8-22 mins ?  ?   ? ? ?Nikcole Eischeid P, PT ?Acute Rehabilitation Services ?Pager: [phone] ?Office: [phone] ? ? ?Ruble Pumphrey B Roddy Bellamy ?05/26/2021, 2:40 PM ? ?

## 2021-05-26 NOTE — Progress Notes (Signed)
Progress Note     Subjective: Pt reports pain in RUE and LLE with turning, back is mostly just sore. He would like to limit narcotics as much as possible but is aware he may need some. Wife at bedside. Denies chest pain, nausea, abdominal pain.   Objective: Vital signs in last 24 hours: Temp:  [97.9 F (36.6 C)-99.6 F (37.6 C)] 98.5 F (36.9 C) (03/04 0807) Pulse Rate:  [64-87] 69 (03/04 0807) Resp:  [16-21] 17 (03/04 0807) BP: (124-170)/(78-92) 150/89 (03/04 0807) SpO2:  [94 %-100 %] 96 % (03/04 0807) Weight:  [101.2 kg] 101.2 kg (03/03 1454) Last BM Date : 05/25/21  Intake/Output from previous day: 03/03 0701 - 03/04 0700 In: 667.3 [P.O.:240; I.V.:427.3] Out: 1000 [Urine:1000] Intake/Output this shift: No intake/output data recorded.  PE: General: pleasant, WD, WN male who is laying in bed in NAD HEENT: head is normocephalic, atraumatic.  Sclera are noninjected. Ears and nose without any masses or lesions.  Mouth is pink and moist Heart: regular, rate, and rhythm.  Normal s1,s2. No obvious murmurs, gallops, or rubs noted.  Palpable radial pulses bilaterally Lungs: CTAB, no wheezes, rhonchi, or rales noted.  Respiratory effort nonlabored Abd: soft, NT, ND, +BS, no masses, hernias, or organomegaly MS: RUE in sling and ROM intact in R elbow and R wrist, ROM at R shoulder limited by pain, R hand NVI; LLE in splint, L toes WWP and NVI Skin: warm and dry with no masses, lesions, or rashes Neuro: Cranial nerves 2-12 grossly intact, sensation is normal throughout Psych: A&Ox3 with an appropriate affect.    Lab Results:  Recent Labs    05/25/21 1453 05/25/21 1505 05/26/21 0043  WBC 14.6*  --  13.2*  HGB 14.5 14.3 13.7  HCT 41.2 42.0 38.4*  PLT 239  --  183   BMET Recent Labs    05/25/21 1453 05/25/21 1505 05/26/21 0043  NA 136 137 135  K 3.9 4.0 3.7  CL 102 100 104  CO2 27  --  20*  GLUCOSE 217* 214* 151*  BUN 32* 36* 29*  CREATININE 1.45* 1.50* 1.10   CALCIUM 8.7*  --  8.4*   PT/INR No results for input(s): LABPROT, INR in the last 72 hours. CMP     Component Value Date/Time   NA 135 05/26/2021 0043   NA 129 (A) 03/29/2015 0000   K 3.7 05/26/2021 0043   CL 104 05/26/2021 0043   CO2 20 (L) 05/26/2021 0043   GLUCOSE 151 (H) 05/26/2021 0043   BUN 29 (H) 05/26/2021 0043   BUN 26 (A) 03/29/2015 0000   CREATININE 1.10 05/26/2021 0043   CALCIUM 8.4 (L) 05/26/2021 0043   PROT 6.5 05/25/2021 1453   ALBUMIN 3.8 05/25/2021 1453   AST 28 05/25/2021 1453   ALT 29 05/25/2021 1453   ALKPHOS 39 05/25/2021 1453   BILITOT 0.7 05/25/2021 1453   GFRNONAA >60 05/26/2021 0043   GFRAA 53 (L) 01/25/2017 1834   Lipase  No results found for: LIPASE     Studies/Results: DG Chest 1 View  Result Date: 05/25/2021 CLINICAL DATA:  Status post fall, loss of consciousness, head trauma EXAM: CHEST  1 VIEW COMPARISON:  04/16/2016 FINDINGS: The heart size and mediastinal contours are within normal limits. Both lungs are clear. The visualized skeletal structures are unremarkable. IMPRESSION: No active disease. Electronically Signed   By: Elige Ko M.D.   On: 05/25/2021 15:55   DG Shoulder Right  Result Date: 05/25/2021 CLINICAL DATA:  125lb chain fell onto pt right shoulder then pt slipped and twisted his left ankle. Pt denies LOC or head trauma. Pt arrives with splint on left ankle with left ankle deformity and right shoulder deformity. Pt c/o left foot, right shoulder, left ankle, and left lower leg pain. No hx of prior injuries or surgeries. EXAM: RIGHT SHOULDER - 2+ VIEW COMPARISON:  None. FINDINGS: Nondisplaced fracture of the scapula, extending obliquely from the lateral border 1-2 cm below the glenoid, to the superior scapula near the scapular notch. No other fractures.  No bone lesions. Glenohumeral and AC joints are normally spaced and aligned. IMPRESSION: 1. Nondisplaced fracture of the right scapula. 2. No other fractures.  No dislocation.  Electronically Signed   By: Amie Portlandavid  Ormond M.D.   On: 05/25/2021 15:57   DG Tibia/Fibula Left  Result Date: 05/25/2021 CLINICAL DATA:  Injury, heavy chain fell onto patient, patient fell and twisted ankle EXAM: LEFT ANKLE - 2 VIEW; LEFT TIBIA AND FIBULA - 2 VIEW COMPARISON:  None. FINDINGS: Displaced oblique fracture of the proximal left fibular diaphysis. No fracture or dislocation of the proximal tibia. Grossly displaced oblique fracture of the distal left tibial metadiaphysis, with an additional displaced fracture of the posterior malleolus. No fracture of the distal fibula. IMPRESSION: 1. Displaced oblique fracture of the proximal left fibular diaphysis. 2. Grossly displaced oblique fracture of the distal left tibial metadiaphysis, with an additional displaced fracture of the posterior malleolus. 3. No fracture of the distal fibula. Electronically Signed   By: Jearld LeschAlex D Bibbey M.D.   On: 05/25/2021 15:58   DG Ankle 2 Views Left  Result Date: 05/25/2021 CLINICAL DATA:  Fracture reduction EXAM: LEFT ANKLE - 2 VIEW COMPARISON:  05/25/2021 FINDINGS: Frontal, oblique, lateral views of the left ankle are obtained. Cast material obscures underlying bony detail. The oblique distal tibial metaphyseal fracture is again identified, with 1.5 cm of lateral displacement and 0.7 cm of dorsal displacement of the distal fracture fragment remaining. Near anatomic alignment of the posterior malleolar fracture seen previously. The ankle mortise is intact. There is diffuse soft tissue swelling. IMPRESSION: 1. Improved alignment of the distal tibial fracture, with residual dorsal and lateral displacement of the distal fracture fragment as above. 2. Near anatomic alignment of a posterior malleolar fracture. 3. Casting material obscures underlying bony detail. Electronically Signed   By: Sharlet SalinaMichael  Brown M.D.   On: 05/25/2021 17:51   DG Ankle 2 Views Left  Result Date: 05/25/2021 CLINICAL DATA:  Injury, heavy chain fell onto  patient, patient fell and twisted ankle EXAM: LEFT ANKLE - 2 VIEW; LEFT TIBIA AND FIBULA - 2 VIEW COMPARISON:  None. FINDINGS: Displaced oblique fracture of the proximal left fibular diaphysis. No fracture or dislocation of the proximal tibia. Grossly displaced oblique fracture of the distal left tibial metadiaphysis, with an additional displaced fracture of the posterior malleolus. No fracture of the distal fibula. IMPRESSION: 1. Displaced oblique fracture of the proximal left fibular diaphysis. 2. Grossly displaced oblique fracture of the distal left tibial metadiaphysis, with an additional displaced fracture of the posterior malleolus. 3. No fracture of the distal fibula. Electronically Signed   By: Jearld LeschAlex D Bibbey M.D.   On: 05/25/2021 15:58   CT CERVICAL SPINE WO CONTRAST  Result Date: 05/25/2021 CLINICAL DATA:  Injury.  Facial trauma. EXAM: CT CERVICAL SPINE WITHOUT CONTRAST TECHNIQUE: Multidetector CT imaging of the cervical spine was performed without intravenous contrast. Multiplanar CT image reconstructions were also generated. RADIATION DOSE REDUCTION: This exam was performed  according to the departmental dose-optimization program which includes automated exposure control, adjustment of the mA and/or kV according to patient size and/or use of iterative reconstruction technique. COMPARISON:  None. FINDINGS: Alignment: Normal except for minimal anterolisthesis of C3 on C4. Skull base and vertebrae: No acute fracture. No primary bone lesion or focal pathologic process. Soft tissues and spinal canal: No prevertebral fluid or swelling. No visible canal hematoma. Disc levels: Mild left foraminal narrowing at C3-C4 due to uncovertebral spurring. Severe facet arthropathy on the left side of C3-C4. Mild disc space narrowing and endplate changes C6-C7. Upper chest: Not imaged Other: None IMPRESSION: 1. No acute bone abnormality in the cervical spine. 2. Mild degenerative changes particularly along the left side of  C3-C4. Electronically Signed   By: Richarda OverlieAdam  Henn M.D.   On: 05/25/2021 16:38   CT Ankle Left Wo Contrast  Result Date: 05/25/2021 CLINICAL DATA:  Ankle trauma. Fracture. Complex ankle fracture. 125 pound changed fell onto patient's right shoulder and patient twisted left ankle. EXAM: CT OF THE LEFT ANKLE WITHOUT CONTRAST TECHNIQUE: Multidetector CT imaging of the left ankle was performed according to the standard protocol. Multiplanar CT image reconstructions were also generated. RADIATION DOSE REDUCTION: This exam was performed according to the departmental dose-optimization program which includes automated exposure control, adjustment of the mA and/or kV according to patient size and/or use of iterative reconstruction technique. COMPARISON:  Left ankle radiographs 05/25/2021 FINDINGS: Bones/Joint/Cartilage There is a comminuted fracture again seen of the distal tibial diaphysis from a predominantly posterosuperior to anterior inferior and predominantly superior lateral inferior medial orientation. There is up to approximately 12 mm posterior and approximately 5 mm lateral displacement of the distal fracture component with respect to the proximal fracture component. There is an oblique fracture of the posterior malleolus with up to approximately 3 mm AP diastasis but otherwise no significant displacement. This extends through the distal tibial articular surface. There is mild peripheral degenerative spurring of the medial aspect of the talar dome. There is a small region of multiple tiny cortical fragments indicating avulsion injury at the anterior distal fibula at the likely inferior aspect of the anterior tibiofibular ligament insertion (axial series 3, image 76). Additional possible avulsion injury at the more inferior distal fibula at the anterior talofibular ligament insertion. Ligaments Suboptimally assessed by CT. Muscles and Tendons Grossly unremarkable. Soft tissues Mild regional soft tissue swelling.  IMPRESSION:: IMPRESSION: 1. Comminuted, predominantly oblique, moderately acute displaced fracture of the distal tibial diaphysis extending into the anterior distal tibial metaphysis. 2. Nondisplaced intra-articular fracture of the posterior malleolus. 3. Small comminuted fracture fragments from avulsion injury at the anterior distal fibula likely at the inferior insertion of the anterior tibiofibular ligament. Possible avulsion injury at the more inferior distal fibula at the anterior talofibular ligament insertion. Electronically Signed   By: Neita Garnetonald  Viola M.D.   On: 05/25/2021 19:29   CT CHEST ABDOMEN PELVIS W CONTRAST  Result Date: 05/25/2021 CLINICAL DATA:  Work injury. 125 pound chain fell on his right shoulder. EXAM: CT CHEST, ABDOMEN, AND PELVIS WITH CONTRAST TECHNIQUE: Multidetector CT imaging of the chest, abdomen and pelvis was performed following the standard protocol during bolus administration of intravenous contrast. RADIATION DOSE REDUCTION: This exam was performed according to the departmental dose-optimization program which includes automated exposure control, adjustment of the mA and/or kV according to patient size and/or use of iterative reconstruction technique. CONTRAST:  100mL OMNIPAQUE IOHEXOL 300 MG/ML  SOLN COMPARISON:  CT abdomen pelvis dated January 25, 2017. FINDINGS:  CT CHEST FINDINGS Cardiovascular: No significant vascular findings. Normal heart size. No pericardial effusion. Mediastinum/Nodes: No enlarged mediastinal, hilar, or axillary lymph nodes. Thyroid gland, trachea, and esophagus demonstrate no significant findings. Lungs/Pleura: Minimal subsegmental atelectasis in both posterior lower lobes. No focal consolidation, pleural effusion, or pneumothorax. Musculoskeletal: Acute comminuted minimally displaced fracture of the right scapular body and spine. No extension to the glenoid. No additional fracture. CT ABDOMEN PELVIS FINDINGS Hepatobiliary: No focal liver abnormality is  seen. No gallstones, gallbladder wall thickening, or biliary dilatation. Pancreas: Unremarkable. No pancreatic ductal dilatation or surrounding inflammatory changes. Spleen: Normal in size without focal abnormality. Adrenals/Urinary Tract: Adrenal glands are unremarkable. Kidneys are normal, without renal calculi, focal lesion, or hydronephrosis. Bladder is unremarkable. Stomach/Bowel: Stomach is within normal limits. Appendix appears normal. No evidence of bowel wall thickening, distention, or inflammatory changes. Vascular/Lymphatic: No significant vascular findings are present. No enlarged abdominal or pelvic lymph nodes. Reproductive: Prostate is unremarkable. Other: No abdominal wall hernia or abnormality. No abdominopelvic ascites. No pneumoperitoneum. Musculoskeletal: Acute comminuted mildly depressed fracture of the L2 vertebral body without posterior involvement. No retropulsion. Acute nondisplaced longitudinal fracture through the right posterolateral L1 inferior endplate (series 6, image 127). IMPRESSION: 1. Acute comminuted minimally displaced fracture of the right scapular body and spine. 2. Acute comminuted mildly depressed anterior column fracture of the L2 vertebral body. 3. Acute nondisplaced longitudinal fracture through the right posterolateral L1 inferior endplate. 4.  No acute intrathoracic or intra-abdominal traumatic injury. Electronically Signed   By: Obie Dredge M.D.   On: 05/25/2021 16:42   CT 3D RECON AT SCANNER  Result Date: 05/25/2021 CLINICAL DATA:  Ankle trauma. Fracture. Complex ankle fracture. 125 pound changed fell onto patient's right shoulder and patient twisted left ankle. EXAM: CT OF THE LEFT ANKLE WITHOUT CONTRAST TECHNIQUE: Multidetector CT imaging of the left ankle was performed according to the standard protocol. Multiplanar CT image reconstructions were also generated. RADIATION DOSE REDUCTION: This exam was performed according to the departmental dose-optimization  program which includes automated exposure control, adjustment of the mA and/or kV according to patient size and/or use of iterative reconstruction technique. COMPARISON:  Left ankle radiographs 05/25/2021 FINDINGS: Bones/Joint/Cartilage There is a comminuted fracture again seen of the distal tibial diaphysis from a predominantly posterosuperior to anterior inferior and predominantly superior lateral inferior medial orientation. There is up to approximately 12 mm posterior and approximately 5 mm lateral displacement of the distal fracture component with respect to the proximal fracture component. There is an oblique fracture of the posterior malleolus with up to approximately 3 mm AP diastasis but otherwise no significant displacement. This extends through the distal tibial articular surface. There is mild peripheral degenerative spurring of the medial aspect of the talar dome. There is a small region of multiple tiny cortical fragments indicating avulsion injury at the anterior distal fibula at the likely inferior aspect of the anterior tibiofibular ligament insertion (axial series 3, image 76). Additional possible avulsion injury at the more inferior distal fibula at the anterior talofibular ligament insertion. Ligaments Suboptimally assessed by CT. Muscles and Tendons Grossly unremarkable. Soft tissues Mild regional soft tissue swelling. IMPRESSION:: IMPRESSION: 1. Comminuted, predominantly oblique, moderately acute displaced fracture of the distal tibial diaphysis extending into the anterior distal tibial metaphysis. 2. Nondisplaced intra-articular fracture of the posterior malleolus. 3. Small comminuted fracture fragments from avulsion injury at the anterior distal fibula likely at the inferior insertion of the anterior tibiofibular ligament. Possible avulsion injury at the more inferior distal fibula at the  anterior talofibular ligament insertion. Electronically Signed   By: Neita Garnet M.D.   On: 05/25/2021  19:29   DG Foot 2 Views Left  Result Date: 05/25/2021 CLINICAL DATA:  Twisted ankle EXAM: LEFT FOOT - 2 VIEW COMPARISON:  Left foot radiographs 06/30/2014 FINDINGS: The distal tibial fracture is better assessed on the dedicated ankle radiographs. No additional fracture of the foot is seen. Alignment appears normal. There is no erosive change. The soft tissues of the foot are unremarkable. IMPRESSION: No acute fracture or dislocation of the foot. Please also see separately dictated ankle radiographs. Electronically Signed   By: Lesia Hausen M.D.   On: 05/25/2021 16:03    Anti-infectives: Anti-infectives (From admission, onward)    None        Assessment/Plan Workplace fall/injury R scapula fx - per ortho, NWB RUE, sling L2 vertebral body fx/L1 inf endplate fx  - per NS, formal consult pending but recommend TLSO  L tib-fib and L malleolus fx - per ortho, NWB LLE, splint, likely OR Monday with ortho trauma  AKI - improving, continue to monitor Hyperglycemia - SSI, monitor HTN - home meds HLD - home meds  FEN: reg diet, IVF @ 75 cc/h VTE: SCDs, LMWH ID: no current abx  LOS: 1 day   I reviewed Consultant Ortho/NS notes, last 24 h vitals and pain scores, last 48 h intake and output, last 24 h labs and trends, and last 24 h imaging results.  This care required moderate level of medical decision making.    Juliet Rude, [facility] Surgery 05/26/2021, 9:41 AM Please see Amion for pager number during day hours 7:00am-4:30pm

## 2021-05-26 NOTE — Progress Notes (Signed)
? ? ? ?  Subjective: ? ?Patient reports pain as moderate, well controlled with as needed oral opioids and scheduled Tylenol.  Denies distal numbness tingling in the hands or feet.  States when he is not moving his left foot his pain is well controlled but notes some discomfort in movement when he tries to rotate or move his left leg.  Discussed again plan for OR with Dr. Carola Frost likely Monday morning fix the left tibia fracture. ? ?Objective:  ? ?VITALS:   ?Vitals:  ? 05/25/21 2057 05/25/21 2343 05/26/21 0417 05/26/21 8182  ?BP: (!) 152/78 139/89 (!) 148/83 (!) 150/89  ?Pulse: 87 73 71 69  ?Resp: 17 20 18 17   ?Temp: 99.6 ?F (37.6 ?C) 98.9 ?F (37.2 ?C) 98.3 ?F (36.8 ?C) 98.5 ?F (36.9 ?C)  ?TempSrc: Oral Oral Oral Oral  ?SpO2: 96% 95% 94% 96%  ?Weight:      ?Height:      ? ?Splint c/d/I, left lower leg compartments are soft and compressible, fires EHL FHL sensation intact dorsal and plantar aspects of the foot toes warm and well perfused palpable DP pulse. ?RUE Nontender ?No elbow or wrist effusion ? Sens median, radial, ulnar intact ? Motor AIN, PIN, IO intact ? Radial pulse 2+, No significant edema ? ? ?Lab Results  ?Component Value Date  ? WBC 13.2 (H) 05/26/2021  ? HGB 13.7 05/26/2021  ? HCT 38.4 (L) 05/26/2021  ? MCV 92.3 05/26/2021  ? PLT 183 05/26/2021  ? ?BMET ?   ?Component Value Date/Time  ? NA 135 05/26/2021 0043  ? NA 129 (A) 03/29/2015 0000  ? K 3.7 05/26/2021 0043  ? CL 104 05/26/2021 0043  ? CO2 20 (L) 05/26/2021 0043  ? GLUCOSE 151 (H) 05/26/2021 0043  ? BUN 29 (H) 05/26/2021 0043  ? BUN 26 (A) 03/29/2015 0000  ? CREATININE 1.10 05/26/2021 0043  ? CALCIUM 8.4 (L) 05/26/2021 0043  ? GFRNONAA >60 05/26/2021 0043  ? ? ?Assessment/Plan: ?   ? ?Principal Problem: ?  Multiple fractures ? ?Secondary exam today reassuring. ? ?R scapular body fx- anticipate nonop treatment. NWB RUE in sling. ?  ?L tibia/fibular shaft fx with posterior mal fx- Discussed complex nature of injury involving distal tibia, posterior mal  of the ankle, syndesmosis, and proximal fibular shaft. Surgical fixation is recommended. Given proximity to the ankle joint, fixation with plate and screws construct recommended for the distal tibia, with possible need for repair of the posterior mal, sydnesmosis, and/or proximal fibula depending on intraop assessment of stability after the tibia is fixed. Given complex nature of injury, radiographs were reviewed with Dr. 07/26/2021 who will be available to fix the patient's injury on Monday. Patient to be NPO after midnight on Sunday night. Patient will be NWB in splint until surgery. ?  ? ? ? ?[name] A [name] ?05/26/2021, 8:17 AM ? ? ?07/26/2021, MD ? ?Contact information:   ?Weekdays 7am-5pm epic message Dr. [id number], or call office for patient follow up: [phone] ?After hours and holidays please check [email or web address] for group call information for [facility] ? ?  ?

## 2021-05-26 NOTE — Consult Note (Signed)
Reason for Consult:L2 fx Referring Physician: edp  Devin Roberts is an 61 y.o. male.   HPI:  61 year old male comes in today after 150 pound machinery fell on his shoulder.  States that he fell to the ground in a twisting motion.  Had left ankle pain some mild back soreness and right shoulder pain.  He was brought in via EMS.  He is scheduled for surgery on his left leg on Monday.  Had some minimal numbness in his left lower extremity this morning.  Otherwise, has no radicular pain in his legs denies any weakness.  Past Medical History:  Diagnosis Date   Acute asthmatic bronchitis    Allergic rhinitis    Anxiety    Chronic insomnia    Diabetes mellitus    DJD (degenerative joint disease)    GERD (gastroesophageal reflux disease)    Headache(784.0)    Hypercholesterolemia    Hypertension    Obesity     Past Surgical History:  Procedure Laterality Date   SHOULDER SURGERY      Allergies  Allergen Reactions   Codeine Itching   Lisinopril     REACTION: Allergic to ACE inhibitors w/ cough   Simvastatin     REACTION: pt states ZOCOR caused leg cramps    Social History   Tobacco Use   Smoking status: Never   Smokeless tobacco: Never  Substance Use Topics   Alcohol use: No    Family History  Problem Relation Age of Onset   COPD Father    Lung cancer Father    Hyperlipidemia Mother    Cancer Neg Hx    Diabetes Neg Hx    Heart disease Neg Hx    Hypertension Neg Hx    Kidney disease Neg Hx    Stroke Neg Hx      Review of Systems  Positive ROS: As above  All other systems have been reviewed and were otherwise negative with the exception of those mentioned in the HPI and as above.  Objective: Vital signs in last 24 hours: Temp:  [97.9 F (36.6 C)-99.6 F (37.6 C)] 98.5 F (36.9 C) (03/04 0807) Pulse Rate:  [64-87] 69 (03/04 0807) Resp:  [16-21] 17 (03/04 0807) BP: (124-170)/(78-92) 150/89 (03/04 0807) SpO2:  [94 %-100 %] 96 % (03/04 0807) Weight:  [101.2 kg]  101.2 kg (03/03 1454)  General Appearance: Alert, cooperative, no distress, appears stated age Head: Normocephalic, without obvious abnormality, atraumatic Eyes: PERRL, conjunctiva/corneas clear, EOM's intact, fundi benign, both eyes      Lungs: respirations unlabored Heart: Regular rate and rhythm, Extremities: Extremities normal, atraumatic, no cyanosis or edema, LLE splinted Pulses: 2+ and symmetric all extremities Skin: Skin color, texture, turgor normal, no rashes or lesions  NEUROLOGIC:   Mental status: A&O x4, no aphasia, good attention span, Memory and fund of knowledge Motor Exam - grossly normal, normal tone and bulk Sensory Exam - grossly normal Reflexes: symmetric, no pathologic reflexes, No Hoffman's, No clonus Coordination -not tested Gait -not tested Balance -not tested Cranial Nerves: I: smell Not tested  II: visual acuity  OS: na    OD: na  II: visual fields Full to confrontation  II: pupils Equal, round, reactive to light  III,VII: ptosis None  III,IV,VI: extraocular muscles  Full ROM  V: mastication   V: facial light touch sensation    V,VII: corneal reflex    VII: facial muscle function - upper    VII: facial muscle function - lower  VIII: hearing   IX: soft palate elevation    IX,X: gag reflex   XI: trapezius strength    XI: sternocleidomastoid strength   XI: neck flexion strength    XII: tongue strength      Data Review Lab Results  Component Value Date   WBC 13.2 (H) 05/26/2021   HGB 13.7 05/26/2021   HCT 38.4 (L) 05/26/2021   MCV 92.3 05/26/2021   PLT 183 05/26/2021   Lab Results  Component Value Date   NA 135 05/26/2021   K 3.7 05/26/2021   CL 104 05/26/2021   CO2 20 (L) 05/26/2021   BUN 29 (H) 05/26/2021   CREATININE 1.10 05/26/2021   GLUCOSE 151 (H) 05/26/2021   No results found for: INR, PROTIME  Radiology: DG Chest 1 View  Result Date: 05/25/2021 CLINICAL DATA:  Status post fall, loss of consciousness, head trauma EXAM:  CHEST  1 VIEW COMPARISON:  04/16/2016 FINDINGS: The heart size and mediastinal contours are within normal limits. Both lungs are clear. The visualized skeletal structures are unremarkable. IMPRESSION: No active disease. Electronically Signed   By: Elige Ko M.D.   On: 05/25/2021 15:55   DG Shoulder Right  Result Date: 05/25/2021 CLINICAL DATA:  125lb chain fell onto pt right shoulder then pt slipped and twisted his left ankle. Pt denies LOC or head trauma. Pt arrives with splint on left ankle with left ankle deformity and right shoulder deformity. Pt c/o left foot, right shoulder, left ankle, and left lower leg pain. No hx of prior injuries or surgeries. EXAM: RIGHT SHOULDER - 2+ VIEW COMPARISON:  None. FINDINGS: Nondisplaced fracture of the scapula, extending obliquely from the lateral border 1-2 cm below the glenoid, to the superior scapula near the scapular notch. No other fractures.  No bone lesions. Glenohumeral and AC joints are normally spaced and aligned. IMPRESSION: 1. Nondisplaced fracture of the right scapula. 2. No other fractures.  No dislocation. Electronically Signed   By: Amie Portland M.D.   On: 05/25/2021 15:57   DG Tibia/Fibula Left  Result Date: 05/25/2021 CLINICAL DATA:  Injury, heavy chain fell onto patient, patient fell and twisted ankle EXAM: LEFT ANKLE - 2 VIEW; LEFT TIBIA AND FIBULA - 2 VIEW COMPARISON:  None. FINDINGS: Displaced oblique fracture of the proximal left fibular diaphysis. No fracture or dislocation of the proximal tibia. Grossly displaced oblique fracture of the distal left tibial metadiaphysis, with an additional displaced fracture of the posterior malleolus. No fracture of the distal fibula. IMPRESSION: 1. Displaced oblique fracture of the proximal left fibular diaphysis. 2. Grossly displaced oblique fracture of the distal left tibial metadiaphysis, with an additional displaced fracture of the posterior malleolus. 3. No fracture of the distal fibula. Electronically  Signed   By: Jearld Lesch M.D.   On: 05/25/2021 15:58   DG Ankle 2 Views Left  Result Date: 05/25/2021 CLINICAL DATA:  Fracture reduction EXAM: LEFT ANKLE - 2 VIEW COMPARISON:  05/25/2021 FINDINGS: Frontal, oblique, lateral views of the left ankle are obtained. Cast material obscures underlying bony detail. The oblique distal tibial metaphyseal fracture is again identified, with 1.5 cm of lateral displacement and 0.7 cm of dorsal displacement of the distal fracture fragment remaining. Near anatomic alignment of the posterior malleolar fracture seen previously. The ankle mortise is intact. There is diffuse soft tissue swelling. IMPRESSION: 1. Improved alignment of the distal tibial fracture, with residual dorsal and lateral displacement of the distal fracture fragment as above. 2. Near anatomic alignment of a  posterior malleolar fracture. 3. Casting material obscures underlying bony detail. Electronically Signed   By: Sharlet Salina M.D.   On: 05/25/2021 17:51   DG Ankle 2 Views Left  Result Date: 05/25/2021 CLINICAL DATA:  Injury, heavy chain fell onto patient, patient fell and twisted ankle EXAM: LEFT ANKLE - 2 VIEW; LEFT TIBIA AND FIBULA - 2 VIEW COMPARISON:  None. FINDINGS: Displaced oblique fracture of the proximal left fibular diaphysis. No fracture or dislocation of the proximal tibia. Grossly displaced oblique fracture of the distal left tibial metadiaphysis, with an additional displaced fracture of the posterior malleolus. No fracture of the distal fibula. IMPRESSION: 1. Displaced oblique fracture of the proximal left fibular diaphysis. 2. Grossly displaced oblique fracture of the distal left tibial metadiaphysis, with an additional displaced fracture of the posterior malleolus. 3. No fracture of the distal fibula. Electronically Signed   By: Jearld Lesch M.D.   On: 05/25/2021 15:58   CT CERVICAL SPINE WO CONTRAST  Result Date: 05/25/2021 CLINICAL DATA:  Injury.  Facial trauma. EXAM: CT CERVICAL  SPINE WITHOUT CONTRAST TECHNIQUE: Multidetector CT imaging of the cervical spine was performed without intravenous contrast. Multiplanar CT image reconstructions were also generated. RADIATION DOSE REDUCTION: This exam was performed according to the departmental dose-optimization program which includes automated exposure control, adjustment of the mA and/or kV according to patient size and/or use of iterative reconstruction technique. COMPARISON:  None. FINDINGS: Alignment: Normal except for minimal anterolisthesis of C3 on C4. Skull base and vertebrae: No acute fracture. No primary bone lesion or focal pathologic process. Soft tissues and spinal canal: No prevertebral fluid or swelling. No visible canal hematoma. Disc levels: Mild left foraminal narrowing at C3-C4 due to uncovertebral spurring. Severe facet arthropathy on the left side of C3-C4. Mild disc space narrowing and endplate changes C6-C7. Upper chest: Not imaged Other: None IMPRESSION: 1. No acute bone abnormality in the cervical spine. 2. Mild degenerative changes particularly along the left side of C3-C4. Electronically Signed   By: Richarda Overlie M.D.   On: 05/25/2021 16:38   CT Ankle Left Wo Contrast  Result Date: 05/25/2021 CLINICAL DATA:  Ankle trauma. Fracture. Complex ankle fracture. 125 pound changed fell onto patient's right shoulder and patient twisted left ankle. EXAM: CT OF THE LEFT ANKLE WITHOUT CONTRAST TECHNIQUE: Multidetector CT imaging of the left ankle was performed according to the standard protocol. Multiplanar CT image reconstructions were also generated. RADIATION DOSE REDUCTION: This exam was performed according to the departmental dose-optimization program which includes automated exposure control, adjustment of the mA and/or kV according to patient size and/or use of iterative reconstruction technique. COMPARISON:  Left ankle radiographs 05/25/2021 FINDINGS: Bones/Joint/Cartilage There is a comminuted fracture again seen of the  distal tibial diaphysis from a predominantly posterosuperior to anterior inferior and predominantly superior lateral inferior medial orientation. There is up to approximately 12 mm posterior and approximately 5 mm lateral displacement of the distal fracture component with respect to the proximal fracture component. There is an oblique fracture of the posterior malleolus with up to approximately 3 mm AP diastasis but otherwise no significant displacement. This extends through the distal tibial articular surface. There is mild peripheral degenerative spurring of the medial aspect of the talar dome. There is a small region of multiple tiny cortical fragments indicating avulsion injury at the anterior distal fibula at the likely inferior aspect of the anterior tibiofibular ligament insertion (axial series 3, image 76). Additional possible avulsion injury at the more inferior distal fibula at the  anterior talofibular ligament insertion. Ligaments Suboptimally assessed by CT. Muscles and Tendons Grossly unremarkable. Soft tissues Mild regional soft tissue swelling. IMPRESSION:: IMPRESSION: 1. Comminuted, predominantly oblique, moderately acute displaced fracture of the distal tibial diaphysis extending into the anterior distal tibial metaphysis. 2. Nondisplaced intra-articular fracture of the posterior malleolus. 3. Small comminuted fracture fragments from avulsion injury at the anterior distal fibula likely at the inferior insertion of the anterior tibiofibular ligament. Possible avulsion injury at the more inferior distal fibula at the anterior talofibular ligament insertion. Electronically Signed   By: Neita Garnet M.D.   On: 05/25/2021 19:29   CT CHEST ABDOMEN PELVIS W CONTRAST  Result Date: 05/25/2021 CLINICAL DATA:  Work injury. 125 pound chain fell on his right shoulder. EXAM: CT CHEST, ABDOMEN, AND PELVIS WITH CONTRAST TECHNIQUE: Multidetector CT imaging of the chest, abdomen and pelvis was performed following  the standard protocol during bolus administration of intravenous contrast. RADIATION DOSE REDUCTION: This exam was performed according to the departmental dose-optimization program which includes automated exposure control, adjustment of the mA and/or kV according to patient size and/or use of iterative reconstruction technique. CONTRAST:  OMNIPAQUE IOHEXOL 300 MG/ML  SOLN COMPARISON:  CT abdomen pelvis dated January 25, 2017. FINDINGS: CT CHEST FINDINGS Cardiovascular: No significant vascular findings. Normal heart size. No pericardial effusion. Mediastinum/Nodes: No enlarged mediastinal, hilar, or axillary lymph nodes. Thyroid gland, trachea, and esophagus demonstrate no significant findings. Lungs/Pleura: Minimal subsegmental atelectasis in both posterior lower lobes. No focal consolidation, pleural effusion, or pneumothorax. Musculoskeletal: Acute comminuted minimally displaced fracture of the right scapular body and spine. No extension to the glenoid. No additional fracture. CT ABDOMEN PELVIS FINDINGS Hepatobiliary: No focal liver abnormality is seen. No gallstones, gallbladder wall thickening, or biliary dilatation. Pancreas: Unremarkable. No pancreatic ductal dilatation or surrounding inflammatory changes. Spleen: Normal in size without focal abnormality. Adrenals/Urinary Tract: Adrenal glands are unremarkable. Kidneys are normal, without renal calculi, focal lesion, or hydronephrosis. Bladder is unremarkable. Stomach/Bowel: Stomach is within normal limits. Appendix appears normal. No evidence of bowel wall thickening, distention, or inflammatory changes. Vascular/Lymphatic: No significant vascular findings are present. No enlarged abdominal or pelvic lymph nodes. Reproductive: Prostate is unremarkable. Other: No abdominal wall hernia or abnormality. No abdominopelvic ascites. No pneumoperitoneum. Musculoskeletal: Acute comminuted mildly depressed fracture of the L2 vertebral body without posterior  involvement. No retropulsion. Acute nondisplaced longitudinal fracture through the right posterolateral L1 inferior endplate (series 6, image 127). IMPRESSION: 1. Acute comminuted minimally displaced fracture of the right scapular body and spine. 2. Acute comminuted mildly depressed anterior column fracture of the L2 vertebral body. 3. Acute nondisplaced longitudinal fracture through the right posterolateral L1 inferior endplate. 4.  No acute intrathoracic or intra-abdominal traumatic injury. Electronically Signed   By: Obie Dredge M.D.   On: 05/25/2021 16:42   CT 3D RECON AT SCANNER  Result Date: 05/25/2021 CLINICAL DATA:  Ankle trauma. Fracture. Complex ankle fracture. 125 pound changed fell onto patient's right shoulder and patient twisted left ankle. EXAM: CT OF THE LEFT ANKLE WITHOUT CONTRAST TECHNIQUE: Multidetector CT imaging of the left ankle was performed according to the standard protocol. Multiplanar CT image reconstructions were also generated. RADIATION DOSE REDUCTION: This exam was performed according to the departmental dose-optimization program which includes automated exposure control, adjustment of the mA and/or kV according to patient size and/or use of iterative reconstruction technique. COMPARISON:  Left ankle radiographs 05/25/2021 FINDINGS: Bones/Joint/Cartilage There is a comminuted fracture again seen of the distal tibial diaphysis from a predominantly posterosuperior  to anterior inferior and predominantly superior lateral inferior medial orientation. There is up to approximately 12 mm posterior and approximately 5 mm lateral displacement of the distal fracture component with respect to the proximal fracture component. There is an oblique fracture of the posterior malleolus with up to approximately 3 mm AP diastasis but otherwise no significant displacement. This extends through the distal tibial articular surface. There is mild peripheral degenerative spurring of the medial aspect of  the talar dome. There is a small region of multiple tiny cortical fragments indicating avulsion injury at the anterior distal fibula at the likely inferior aspect of the anterior tibiofibular ligament insertion (axial series 3, image 76). Additional possible avulsion injury at the more inferior distal fibula at the anterior talofibular ligament insertion. Ligaments Suboptimally assessed by CT. Muscles and Tendons Grossly unremarkable. Soft tissues Mild regional soft tissue swelling. IMPRESSION:: IMPRESSION: 1. Comminuted, predominantly oblique, moderately acute displaced fracture of the distal tibial diaphysis extending into the anterior distal tibial metaphysis. 2. Nondisplaced intra-articular fracture of the posterior malleolus. 3. Small comminuted fracture fragments from avulsion injury at the anterior distal fibula likely at the inferior insertion of the anterior tibiofibular ligament. Possible avulsion injury at the more inferior distal fibula at the anterior talofibular ligament insertion. Electronically Signed   By: Neita Garnetonald  Viola M.D.   On: 05/25/2021 19:29   DG Foot 2 Views Left  Result Date: 05/25/2021 CLINICAL DATA:  Twisted ankle EXAM: LEFT FOOT - 2 VIEW COMPARISON:  Left foot radiographs 06/30/2014 FINDINGS: The distal tibial fracture is better assessed on the dedicated ankle radiographs. No additional fracture of the foot is seen. Alignment appears normal. There is no erosive change. The soft tissues of the foot are unremarkable. IMPRESSION: No acute fracture or dislocation of the foot. Please also see separately dictated ankle radiographs. Electronically Signed   By: Lesia HausenPeter  Noone M.D.   On: 05/25/2021 16:03     Assessment/Plan:  61 year old patient came in last night after having 125 pound machinery fall on his shoulder at work.  He sustained a longitudinal fracture through the L2 vertebral body and a mild compression fracture of the inferior plate of L1.  He is scheduled for surgery on his  left leg on Monday.  When he is cleared to ambulate we will try him in an LSO brace when out of bed.  Tiana LoftKimberly Hannah [facility] Center[facility] 05/26/2021 9:39 AM

## 2021-05-26 NOTE — Progress Notes (Addendum)
Orthopedic Tech Progress Note ?Patient Details:  ?Devin Roberts ?[birth year] ?[id number] ? ?Sling was already applied to RUE. I adjusted the TLSO to best estimate as pt was in too much discomfort to sit up/lean forward.  Explained to pt and his wife how to apply/remove the brace. ? ?Also applied two ice packs to LLE and elevated his leg before leaving.  ? ?Ortho Devices ?Type of Ortho Device: Thoracolumbar corset (TLSO) ?Ortho Device/Splint Location: in pt belongings cabinet ?Ortho Device/Splint Interventions: Ordered, Adjustment ?  ?Post Interventions ?Patient Tolerated: Other (comment) (not applied, for use when OOB) ?Instructions Provided: Adjustment of device, Care of device ? ?Kalyan Barabas Carmine Savoy ?05/26/2021, 12:15 PM ? ?

## 2021-05-27 ENCOUNTER — Encounter (HOSPITAL_COMMUNITY): Payer: Self-pay

## 2021-05-27 LAB — BASIC METABOLIC PANEL
Anion gap: 8 (ref 5–15)
BUN: 18 mg/dL (ref 6–20)
CO2: 25 mmol/L (ref 22–32)
Calcium: 8.7 mg/dL — ABNORMAL LOW (ref 8.9–10.3)
Chloride: 104 mmol/L (ref 98–111)
Creatinine, Ser: 0.89 mg/dL (ref 0.61–1.24)
GFR, Estimated: >60 mL/min (ref >60)
Glucose, Bld: 145 mg/dL — ABNORMAL HIGH (ref 70–99)
Potassium: 3.9 mmol/L (ref 3.5–5.1)
Sodium: 137 mmol/L (ref 135–145)

## 2021-05-27 LAB — GLUCOSE, CAPILLARY
Glucose-Capillary: 210 mg/dL — ABNORMAL HIGH (ref 70–99)
Glucose-Capillary: 119 mg/dL — ABNORMAL HIGH (ref 70–99)
Glucose-Capillary: 124 mg/dL — ABNORMAL HIGH (ref 70–99)
Glucose-Capillary: 161 mg/dL — ABNORMAL HIGH (ref 70–99)

## 2021-05-27 LAB — CBC
HCT: 38.3 % — ABNORMAL LOW (ref 39.0–52.0)
Hemoglobin: 13.2 g/dL (ref 13.0–17.0)
MCH: 32.6 pg (ref 26.0–34.0)
MCHC: 34.5 g/dL (ref 30.0–36.0)
MCV: 94.6 fL (ref 80.0–100.0)
Platelets: 187 10*3/uL (ref 150–400)
RBC: 4.05 MIL/uL — ABNORMAL LOW (ref 4.22–5.81)
RDW: 12.4 % (ref 11.5–15.5)
WBC: 11.3 10*3/uL — ABNORMAL HIGH (ref 4.0–10.5)
nRBC: 0 % (ref 0.0–0.2)

## 2021-05-27 LAB — SURGICAL PCR SCREEN
MRSA, PCR: NEGATIVE
Staphylococcus aureus: POSITIVE — AB

## 2021-05-27 MED ORDER — CEFAZOLIN SODIUM-DEXTROSE 2-4 GM/100ML-% IV SOLN
2.0000 g | INTRAVENOUS | Status: AC
  Administered 2021-05-28: 2 g via INTRAVENOUS
  Filled 2021-05-27 (×3): qty 100

## 2021-05-27 MED ORDER — CHLORHEXIDINE GLUCONATE CLOTH 2 % EX PADS
6.0000 | MEDICATED_PAD | Freq: Every day | CUTANEOUS | Status: DC
  Administered 2021-05-27 – 2021-05-30 (×3): 6 via TOPICAL

## 2021-05-27 MED ORDER — MUPIROCIN 2 % EX OINT
1.0000 "application " | TOPICAL_OINTMENT | Freq: Two times a day (BID) | CUTANEOUS | Status: DC
  Administered 2021-05-27 – 2021-05-30 (×6): 1 via NASAL
  Filled 2021-05-27 (×2): qty 22

## 2021-05-27 MED ORDER — DIPHENHYDRAMINE HCL 25 MG PO CAPS
25.0000 mg | ORAL_CAPSULE | Freq: Four times a day (QID) | ORAL | Status: DC | PRN
  Administered 2021-05-27 – 2021-05-29 (×4): 25 mg via ORAL
  Filled 2021-05-27 (×4): qty 1

## 2021-05-27 MED ORDER — SODIUM CHLORIDE 0.9 % IV SOLN: INTRAVENOUS | Status: DC

## 2021-05-27 NOTE — Progress Notes (Signed)
Physical Therapy Treatment ?Patient Details ?Name: Devin Roberts ?MRN: [id number] ?DOB: 06/21/1960 ?Today's Date: 05/27/2021 ? ? ?History of Present Illness 61 yo admitted 3/3 after workplace accident where crane fell on his shoulder and he fell between 2 pallets. Pt with L1-2 fx, complex LLE fx await sx 3/6, Rt scapula fx. PMhx: HTN, HLD, DM, anxiety, obesity ? ?  ?PT Comments  ? ? Continuing work on functional mobility and activity tolerance;  Session focused on functional transfers within weight bearing parameters; Pt showed good rolling to don TLSO, and improvements in bed mobility, smoother motion with less assist needed; Stood with 2 person assist, and pivoted on RLE to recliner placed on pt's R side; Heavy mod assist for balance, and noted pt putting some weight through LLE (propped on this PT's foot); Overall good progress; Anticipate surgery tomorrow  ?Recommendations for follow up therapy are one component of a multi-disciplinary discharge planning process, led by the attending physician.  Recommendations may be updated based on patient status, additional functional criteria and insurance authorization. ? ?Follow Up Recommendations ? Acute inpatient rehab (3hours/day) ?  ?  ?Assistance Recommended at Discharge Set up Supervision/Assistance  ?Patient can return home with the following A lot of help with walking and/or transfers;A lot of help with bathing/dressing/bathroom;Help with stairs or ramp for entrance;Assist for transportation;Assistance with cooking/housework ?  ?Equipment Recommendations ? Wheelchair (measurements PT);BSC/3in1;Wheelchair cushion (measurements PT)  ?  ?Recommendations for Other Services   ? ? ?  ?Precautions / Restrictions Precautions ?Precautions: Back;Fall ?Precaution Comments: sling RUE ?Required Braces or Orthoses: Spinal Brace;Sling ?Spinal Brace: Thoracolumbosacral orthotic;Applied in supine position ?Restrictions ?RUE Weight Bearing: Non weight bearing (Per Ortho Noted 3/5, OK  to bear weight through RUE) ?LLE Weight Bearing: Non weight bearing  ?  ? ?Mobility ? Bed Mobility ?Overal bed mobility: Needs Assistance ?Bed Mobility: Rolling, Sidelying to Sit ?Rolling: Min assist ?Sidelying to sit: Min assist ?  ?  ?  ?General bed mobility comments: pt able to roll toward L side well and using LLE to push into roll. pt able to sustain L hip side lying. pt needs (A) to push up with L UE bed surface. pt static sitting min guard (A) ?  ? ?Transfers ?Overall transfer level: Needs assistance ?  ?Transfers: Sit to/from Stand, Bed to chair/wheelchair/BSC ?Sit to Stand: Mod assist, +2 physical assistance ?Stand pivot transfers: Mod assist, +2 physical assistance ?  ?  ?  ?  ?General transfer comment: pt with LLE placed on PT foot to ensure NWB. pt able to achieve full upright posture. Pt able to pivot on R ankle to chair and (A) to lower to surface. Will need reinforcement and practice to keep weight fully off of LLE ?  ? ?Ambulation/Gait ?  ?  ?  ?  ?  ?  ?  ?  ? ? ?Stairs ?  ?  ?  ?  ?  ? ? ?Wheelchair Mobility ?  ? ?Modified Rankin (Stroke Patients Only) ?  ? ? ?  ?Balance   ?  ?Sitting balance-Leahy Scale: Good ?  ?  ?Standing balance support: Single extremity supported, During functional activity ?Standing balance-Leahy Scale: Fair ?Standing balance comment: reliant on external support of therapist ?  ?  ?  ?  ?  ?  ?  ?  ?  ?  ?  ?  ? ?  ?Cognition Arousal/Alertness: Awake/alert ?Behavior During Therapy: [facility] for tasks assessed/performed ?Overall Cognitive Status: Within Functional Limits for tasks assessed ?  ?  ?  ?  ?  ?  ?  ?  ?  ?  ?  ?  ?  ?  ?  ?  ?  ?  ?  ? ?  ?  Exercises   ? ?  ?General Comments General comments (skin integrity, edema, etc.): VSS ?  ?  ? ?Pertinent Vitals/Pain Pain Assessment ?Pain Assessment: 0-10 ?Pain Score: 5  ?Pain Location: LLE and Rt shoulder ?Pain Descriptors / Indicators: Guarding, Constant, Aching ?Pain Intervention(s): Monitored during session  ? ? ?Home Living    ?  ?  ?  ?  ?  ?  ?  ?  ?  ?   ?  ?Prior Function    ?  ?  ?   ? ?PT Goals (current goals can now be found in the care plan section) Acute Rehab PT Goals ?Patient Stated Goal: return to work and hunting ?PT Goal Formulation: With patient ?Time For Goal Achievement: 06/09/21 ?Potential to Achieve Goals: Good ?Progress towards PT goals: Progressing toward goals ? ?  ?Frequency ? ? ? Min 5X/week ? ? ? ?  ?PT Plan Current plan remains appropriate  ? ? ?Co-evaluation PT/OT/SLP Co-Evaluation/Treatment: Yes ?Reason for Co-Treatment: Complexity of the patient's impairments (multi-system involvement);Necessary to address cognition/behavior during functional activity;For patient/therapist safety;To address functional/ADL transfers ?PT goals addressed during session: Mobility/safety with mobility ?  ?  ? ?  ?AM-PAC PT "6 Clicks" Mobility   ?Outcome Measure ? Help needed turning from your back to your side while in a flat bed without using bedrails?: A Little ?Help needed moving from lying on your back to sitting on the side of a flat bed without using bedrails?: A Lot ?Help needed moving to and from a bed to a chair (including a wheelchair)?: Total ?Help needed standing up from a chair using your arms (e.g., wheelchair or bedside chair)?: Total ?Help needed to walk in hospital room?: Total ?Help needed climbing 3-5 steps with a railing? : Total ?6 Click Score: 9 ? ?  ?End of Session Equipment Utilized During Treatment: Back brace;Other (comment) (sling) ?Activity Tolerance: Patient tolerated treatment well ?Patient left: in chair;with call bell/phone within reach;with chair alarm set ?Nurse Communication: Mobility status ?PT Visit Diagnosis: Other abnormalities of gait and mobility (R26.89);Difficulty in walking, not elsewhere classified (R26.2);Pain ?Pain - Right/Left: Left ?Pain - part of body: Leg ?  ? ? ?Time: 9323-5573 ?PT Time Calculation (min) (ACUTE ONLY): 36 min ? ?Charges:  $Therapeutic Activity: 8-22 mins           ?          ? ?Van Clines, PT  ?Acute Rehabilitation Services ?Pager [phone] ?Office [phone] ? ? ? ?Devin Roberts ?05/27/2021, 4:15 PM ? ?

## 2021-05-27 NOTE — Consult Note (Signed)
Orthopaedic Trauma Service (OTS) Consult   Patient ID: Devin Roberts MRN: [id number] DOB/AGE: 1960/09/21 61 y.o.   Reason for Consult: polytrauma Referring Physician: Weber Cooks, MD    HPI: Devin Roberts is an 61 y.o. male RHD male who sustained an injury while at work.  Patient was loading some pallets onto an overhead lift when he was hit from an object from the lift hoist.  He was standing approximately 3 feet off the ground on the pallets which knocked him off onto the ground.  Patient had twisted his left leg had immediate onset of pain in his right shoulder and left leg.  Inability to bear weight.  Patient was stabilized at the scene.  He was brought to [facility] as a trauma activation with numerous injuries including lumbar fractures, right scapular fracture, left tibia and fibula fracture.  Patient was initially seen by Dr. Blanchie Dessert who is on-call the evening of his presentation.  He was stabilized with respect to his left lower extremity was splinted in a stirrup as well as a long-leg splint.  There was no mention of the fracture being open.  Patient also denies fracture being open he noted small abrasions to his lower leg but bone did not come through the skin.  Right upper extremity was placed into a sling for comfort.  He has been fitted for a LSO for his lumbar fractures.  Due to the complexity of his left leg the orthopedic trauma service was consulted for definitive management as Dr. Blanchie Dessert asserted that this was outside the scope of his practice.  Patient seen and evaluated on 05/27/2021.  He is sitting in bedside chair.  Family is at bedside.  Overall he is comfortable.  Pain is worsened with movement in his left leg it is relieved with rest, pain medication and ice.  Right upper extremity is sore he is able to passively move his shoulder without significant difficulty.  He does have difficulty with active shoulder motion.  Denies any numbness or  tingling in his right upper extremity.  No numbness or tingling in his left lower extremity.  He denies any additional injuries to his left upper extremity or right lower extremity.  Describes his pain as a shooting type of pain in his left leg and ankle.  Denies any knee pain on the left side  Pt was wearing low top work boots   Remote history of right shoulder arthroscopy for debridement of rotator cuff  Patient is employed by the city of [place] He is married  No nicotine or drug use Denies any regular alcohol use  Reports allergies to codeine, lisinopril and simvastatin Medical history notes diabetes but he is not on any medications Hemoglobin A1c is 6.5% which was drawn on this admission  Lactic acid is normal  Past Medical History:  Diagnosis Date   Acute asthmatic bronchitis    Allergic rhinitis    Anxiety    Chronic insomnia    Diabetes mellitus    DJD (degenerative joint disease)    GERD (gastroesophageal reflux disease)    Headache(784.0)    Hypercholesterolemia    Hypertension    Obesity     Past Surgical History:  Procedure Laterality Date   SHOULDER SURGERY      Family History  Problem Relation Age of Onset   COPD Father    Lung cancer Father    Hyperlipidemia Mother    Cancer Neg Hx  Diabetes Neg Hx    Heart disease Neg Hx    Hypertension Neg Hx    Kidney disease Neg Hx    Stroke Neg Hx     Social History:  reports that he has never smoked. He has never used smokeless tobacco. He reports that he does not drink alcohol and does not use drugs.  Allergies:  Allergies  Allergen Reactions   Codeine Itching   Lisinopril     REACTION: Allergic to ACE inhibitors w/ cough   Simvastatin     REACTION: pt states ZOCOR caused leg cramps    Medications: I have reviewed the patient's current medications. Current Meds  Medication Sig   azithromycin (ZITHROMAX) 250 MG tablet Take 250 mg by mouth See admin instructions. Zpack. Start date : 05/20/20    Cholecalciferol 50 MCG (2000 UT) TABS Take 2 tablets (4,000 Units total) by mouth daily. (Patient taking differently: Take 2 tablets by mouth every evening.)   EDARBYCLOR 40-25 MG TABS Take 1 tablet by mouth daily. (Patient taking differently: Take 1 tablet by mouth every evening.)   icosapent Ethyl (VASCEPA) 1 g capsule TAKE (2) CAPSULES BY MOUTH TWICE DAILY. (Patient taking differently: 1 g at bedtime.)   meloxicam (MOBIC) 15 MG tablet TAKE ONE TABLET BY MOUTH DAILY (Patient taking differently: Take 15 mg by mouth daily as needed for pain.)   Polyethyl Glycol-Propyl Glycol (SYSTANE ULTRA) 0.4-0.3 % SOLN Place 1 drop into both eyes every morning.   pravastatin (PRAVACHOL) 40 MG tablet Take 1 tablet (40 mg total) by mouth at bedtime.   sodium chloride (OCEAN) 0.65 % SOLN nasal spray Place 1 spray into both nostrils daily as needed for congestion.   zolpidem (AMBIEN) 10 MG tablet TAKE ONE TABLET BY MOUTH AT BEDTIME AS NEEDED FOR SLEEP (Patient taking differently: Take 10 mg by mouth at bedtime.)     Results for orders placed or performed during the hospital encounter of 05/25/21 (from the past 48 hour(s))  Resp Panel by RT-PCR (Flu A&B, Covid) Nasopharyngeal Swab     Status: None   Collection Time: 05/25/21  2:53 PM   Specimen: Nasopharyngeal Swab; Nasopharyngeal(NP) swabs in vial transport medium  Result Value Ref Range   SARS Coronavirus 2 by RT PCR NEGATIVE NEGATIVE    Comment: (NOTE) SARS-CoV-2 target nucleic acids are NOT DETECTED.  The SARS-CoV-2 RNA is generally detectable in upper respiratory specimens during the acute phase of infection. The lowest concentration of SARS-CoV-2 viral copies this assay can detect is 138 copies/mL. A negative result does not preclude SARS-Cov-2 infection and should not be used as the sole basis for treatment or other patient management decisions. A negative result may occur with  improper specimen collection/handling, submission of specimen other than  nasopharyngeal swab, presence of viral mutation(s) within the areas targeted by this assay, and inadequate number of viral copies(<138 copies/mL). A negative result must be combined with clinical observations, patient history, and epidemiological information. The expected result is Negative.  Fact Sheet for Patients:  BloggerCourse.com  Fact Sheet for Healthcare Providers:  SeriousBroker.it  This test is no t yet approved or cleared by the [place] FDA and  has been authorized for detection and/or diagnosis of SARS-CoV-2 by FDA under an Emergency Use Authorization (EUA). This EUA will remain  in effect (meaning this test can be used) for the duration of the COVID-19 declaration under Section 564(b)(1) of the Act, 21 U.S.C.section 360bbb-3(b)(1), unless the authorization is terminated  or revoked sooner.  Influenza A by PCR NEGATIVE NEGATIVE   Influenza B by PCR NEGATIVE NEGATIVE    Comment: (NOTE) The Xpert Xpress SARS-CoV-2/FLU/RSV plus assay is intended as an aid in the diagnosis of influenza from Nasopharyngeal swab specimens and should not be used as a sole basis for treatment. Nasal washings and aspirates are unacceptable for Xpert Xpress SARS-CoV-2/FLU/RSV testing.  Fact Sheet for Patients: BloggerCourse.com  Fact Sheet for Healthcare Providers: SeriousBroker.it  This test is not yet approved or cleared by the [place] FDA and has been authorized for detection and/or diagnosis of SARS-CoV-2 by FDA under an Emergency Use Authorization (EUA). This EUA will remain in effect (meaning this test can be used) for the duration of the COVID-19 declaration under Section 564(b)(1) of the Act, 21 U.S.C. section 360bbb-3(b)(1), unless the authorization is terminated or revoked.  Performed at [facility], [address] [place], [place] [address]    Comprehensive metabolic panel     Status: Abnormal   Collection Time: 05/25/21  2:53 PM  Result Value Ref Range   Sodium 136 135 - 145 mmol/L   Potassium 3.9 3.5 - 5.1 mmol/L   Chloride 102 98 - 111 mmol/L   CO2 27 22 - 32 mmol/L   Glucose, Bld 217 (H) 70 - 99 mg/dL    Comment: Glucose reference range applies only to samples taken after fasting for at least 8 hours.   BUN 32 (H) 6 - 20 mg/dL   Creatinine, Ser 2.13 (H) 0.61 - 1.24 mg/dL   Calcium 8.7 (L) 8.9 - 10.3 mg/dL   Total Protein 6.5 6.5 - 8.1 g/dL   Albumin 3.8 3.5 - 5.0 g/dL   AST 28 15 - 41 U/L   ALT 29 0 - 44 U/L   Alkaline Phosphatase 39 38 - 126 U/L   Total Bilirubin 0.7 0.3 - 1.2 mg/dL   GFR, Estimated 56 (L) >60 mL/min    Comment: (NOTE) Calculated using the CKD-EPI Creatinine Equation (2021)    Anion gap 7 5 - 15    Comment: Performed at [facility], [address], [place], [place] [address]  CBC     Status: Abnormal   Collection Time: 05/25/21  2:53 PM  Result Value Ref Range   WBC 14.6 (H) 4.0 - 10.5 K/uL   RBC 4.36 4.22 - 5.81 MIL/uL   Hemoglobin 14.5 13.0 - 17.0 g/dL   HCT 84.6 96.2 - 95.2 %   MCV 94.5 80.0 - 100.0 fL   MCH 33.3 26.0 - 34.0 pg   MCHC 35.2 30.0 - 36.0 g/dL   RDW 84.1 32.4 - 40.1 %   Platelets 239 150 - 400 K/uL   nRBC 0.0 0.0 - 0.2 %    Comment: Performed at [facility], [address] [place], [place] [address]  Ethanol     Status: None   Collection Time: 05/25/21  2:53 PM  Result Value Ref Range   Alcohol, Ethyl (B) <10 <10 mg/dL    Comment: (NOTE) Lowest detectable limit for serum alcohol is 10 mg/dL.  For medical purposes only. Performed at [facility], [address] [place], [place] [address]   Sample to Blood Bank     Status: None   Collection Time: 05/25/21  2:54 PM  Result Value Ref Range   Blood Bank Specimen SAMPLE AVAILABLE FOR TESTING    Sample Expiration      05/26/2021,2359 Performed at [facility], [address],  [place], [place] [address]   I-Stat Chem 8, ED     Status: Abnormal   Collection Time: 05/25/21  3:05 PM  Result Value Ref Range   Sodium 137 135 - 145 mmol/L   Potassium 4.0 3.5 - 5.1 mmol/L   Chloride 100 98 - 111 mmol/L   BUN 36 (H) 6 - 20 mg/dL   Creatinine, Ser 1.03 (H) 0.61 - 1.24 mg/dL   Glucose, Bld 013 (H) 70 - 99 mg/dL    Comment: Glucose reference range applies only to samples taken after fasting for at least 8 hours.   Calcium, Ion 1.16 1.15 - 1.40 mmol/L   TCO2 28 22 - 32 mmol/L   Hemoglobin 14.3 13.0 - 17.0 g/dL   HCT 14.3 88.8 - 75.7 %  Lactic acid, plasma     Status: None   Collection Time: 05/25/21  3:16 PM  Result Value Ref Range   Lactic Acid, Venous 1.9 0.5 - 1.9 mmol/L    Comment: Performed at [facility], [address] [place], [place] [address]  Urinalysis, Routine w reflex microscopic     Status: None   Collection Time: 05/25/21  5:16 PM  Result Value Ref Range   Color, Urine YELLOW YELLOW   APPearance CLEAR CLEAR   Specific Gravity, Urine 1.023 1.005 - 1.030   pH 6.0 5.0 - 8.0   Glucose, UA NEGATIVE NEGATIVE mg/dL   Hgb urine dipstick NEGATIVE NEGATIVE   Bilirubin Urine NEGATIVE NEGATIVE   Ketones, ur NEGATIVE NEGATIVE mg/dL   Protein, ur NEGATIVE NEGATIVE mg/dL   Nitrite NEGATIVE NEGATIVE   Leukocytes,Ua NEGATIVE NEGATIVE    Comment: Performed at [facility], [address], [place], [place] [address]  HIV Antibody (routine testing w rflx)     Status: None   Collection Time: 05/26/21 12:43 AM  Result Value Ref Range   HIV Screen 4th Generation wRfx Non Reactive Non Reactive    Comment: Performed at [facility], [address] [place], [place] [address]  Basic metabolic panel     Status: Abnormal   Collection Time: 05/26/21 12:43 AM  Result Value Ref Range   Sodium 135 135 - 145 mmol/L   Potassium 3.7 3.5 - 5.1 mmol/L   Chloride 104 98 - 111 mmol/L   CO2 20 (L) 22 - 32 mmol/L   Glucose, Bld 151 (H) 70 - 99 mg/dL    Comment:  Glucose reference range applies only to samples taken after fasting for at least 8 hours.   BUN 29 (H) 6 - 20 mg/dL   Creatinine, Ser 9.43 0.61 - 1.24 mg/dL   Calcium 8.4 (L) 8.9 - 10.3 mg/dL   GFR, Estimated >27 >61 mL/min    Comment: (NOTE) Calculated using the CKD-EPI Creatinine Equation (2021)    Anion gap 11 5 - 15    Comment: Performed at [facility], [address] [place], [place] [address]  CBC     Status: Abnormal   Collection Time: 05/26/21 12:43 AM  Result Value Ref Range   WBC 13.2 (H) 4.0 - 10.5 K/uL   RBC 4.16 (L) 4.22 - 5.81 MIL/uL   Hemoglobin 13.7 13.0 - 17.0 g/dL   HCT 95.7 (L) 47.3 - 40.3 %   MCV 92.3 80.0 - 100.0 fL   MCH 32.9 26.0 - 34.0 pg   MCHC 35.7 30.0 - 36.0 g/dL   RDW 70.9 64.3 - 83.8 %   Platelets 183  150 - 400 K/uL   nRBC 0.0 0.0 - 0.2 %    Comment: Performed at [facility], [address] [place], [place] [address]  Hemoglobin A1c     Status: Abnormal   Collection Time: 05/26/21 12:43 AM  Result Value Ref Range   Hgb A1c MFr Bld 6.5 (H) 4.8 - 5.6 %    Comment: (NOTE) Pre diabetes:          5.7%-6.4%  Diabetes:              >6.4%  Glycemic control for   <7.0% adults with diabetes    Mean Plasma Glucose 139.85 mg/dL    Comment: Performed at [facility], [address] [place], [place] [address]  Glucose, capillary     Status: Abnormal   Collection Time: 05/26/21  6:02 AM  Result Value Ref Range   Glucose-Capillary 152 (H) 70 - 99 mg/dL    Comment: Glucose reference range applies only to samples taken after fasting for at least 8 hours.  Glucose, capillary     Status: Abnormal   Collection Time: 05/26/21  8:09 AM  Result Value Ref Range   Glucose-Capillary 151 (H) 70 - 99 mg/dL    Comment: Glucose reference range applies only to samples taken after fasting for at least 8 hours.  Glucose, capillary     Status: Abnormal   Collection Time: 05/26/21 11:43 AM  Result Value Ref Range   Glucose-Capillary 139 (H) 70 - 99  mg/dL    Comment: Glucose reference range applies only to samples taken after fasting for at least 8 hours.  Glucose, capillary     Status: Abnormal   Collection Time: 05/26/21  4:49 PM  Result Value Ref Range   Glucose-Capillary 156 (H) 70 - 99 mg/dL    Comment: Glucose reference range applies only to samples taken after fasting for at least 8 hours.  Glucose, capillary     Status: Abnormal   Collection Time: 05/26/21  9:11 PM  Result Value Ref Range   Glucose-Capillary 159 (H) 70 - 99 mg/dL    Comment: Glucose reference range applies only to samples taken after fasting for at least 8 hours.  CBC     Status: Abnormal   Collection Time: 05/27/21 12:26 AM  Result Value Ref Range   WBC 11.3 (H) 4.0 - 10.5 K/uL   RBC 4.05 (L) 4.22 - 5.81 MIL/uL   Hemoglobin 13.2 13.0 - 17.0 g/dL   HCT 09.838.3 (L) 11.939.0 - 14.752.0 %   MCV 94.6 80.0 - 100.0 fL   MCH 32.6 26.0 - 34.0 pg   MCHC 34.5 30.0 - 36.0 g/dL   RDW 82.912.4 56.211.5 - 13.015.5 %   Platelets 187 150 - 400 K/uL   nRBC 0.0 0.0 - 0.2 %    Comment: Performed at [facility], [address] [place], [place] [address]  Basic metabolic panel     Status: Abnormal   Collection Time: 05/27/21 12:26 AM  Result Value Ref Range   Sodium 137 135 - 145 mmol/L   Potassium 3.9 3.5 - 5.1 mmol/L   Chloride 104 98 - 111 mmol/L   CO2 25 22 - 32 mmol/L   Glucose, Bld 145 (H) 70 - 99 mg/dL    Comment: Glucose reference range applies only to samples taken after fasting for at least 8 hours.   BUN 18 6 - 20 mg/dL   Creatinine, Ser 4.690.89 0.61 - 1.24 mg/dL  Calcium 8.7 (L) 8.9 - 10.3 mg/dL   GFR, Estimated >69 >62 mL/min    Comment: (NOTE) Calculated using the CKD-EPI Creatinine Equation (2021)    Anion gap 8 5 - 15    Comment: Performed at [facility], [address] [place], [place] [address]  Surgical pcr screen     Status: Abnormal   Collection Time: 05/27/21  5:03 AM   Specimen: Nasal Mucosa; Nasal Swab  Result Value Ref Range   MRSA, PCR  NEGATIVE NEGATIVE   Staphylococcus aureus POSITIVE (A) NEGATIVE    Comment: (NOTE) The Xpert SA Assay (FDA approved for NASAL specimens in patients 75 years of age and older), is one component of a comprehensive surveillance program. It is not intended to diagnose infection nor to guide or monitor treatment. Performed at [facility], [address] [place], [place] [address]   Glucose, capillary     Status: Abnormal   Collection Time: 05/27/21  7:29 AM  Result Value Ref Range   Glucose-Capillary 210 (H) 70 - 99 mg/dL    Comment: Glucose reference range applies only to samples taken after fasting for at least 8 hours.  Glucose, capillary     Status: Abnormal   Collection Time: 05/27/21 11:24 AM  Result Value Ref Range   Glucose-Capillary 119 (H) 70 - 99 mg/dL    Comment: Glucose reference range applies only to samples taken after fasting for at least 8 hours.    DG Chest 1 View  Result Date: 05/25/2021 CLINICAL DATA:  Status post fall, loss of consciousness, head trauma EXAM: CHEST  1 VIEW COMPARISON:  04/16/2016 FINDINGS: The heart size and mediastinal contours are within normal limits. Both lungs are clear. The visualized skeletal structures are unremarkable. IMPRESSION: No active disease. Electronically Signed   By: Elige Ko M.D.   On: 05/25/2021 15:55   DG Shoulder Right  Result Date: 05/25/2021 CLINICAL DATA:  125lb chain fell onto pt right shoulder then pt slipped and twisted his left ankle. Pt denies LOC or head trauma. Pt arrives with splint on left ankle with left ankle deformity and right shoulder deformity. Pt c/o left foot, right shoulder, left ankle, and left lower leg pain. No hx of prior injuries or surgeries. EXAM: RIGHT SHOULDER - 2+ VIEW COMPARISON:  None. FINDINGS: Nondisplaced fracture of the scapula, extending obliquely from the lateral border 1-2 cm below the glenoid, to the superior scapula near the scapular notch. No other fractures.  No bone lesions.  Glenohumeral and AC joints are normally spaced and aligned. IMPRESSION: 1. Nondisplaced fracture of the right scapula. 2. No other fractures.  No dislocation. Electronically Signed   By: Amie Portland M.D.   On: 05/25/2021 15:57   DG Tibia/Fibula Left  Result Date: 05/25/2021 CLINICAL DATA:  Injury, heavy chain fell onto patient, patient fell and twisted ankle EXAM: LEFT ANKLE - 2 VIEW; LEFT TIBIA AND FIBULA - 2 VIEW COMPARISON:  None. FINDINGS: Displaced oblique fracture of the proximal left fibular diaphysis. No fracture or dislocation of the proximal tibia. Grossly displaced oblique fracture of the distal left tibial metadiaphysis, with an additional displaced fracture of the posterior malleolus. No fracture of the distal fibula. IMPRESSION: 1. Displaced oblique fracture of the proximal left fibular diaphysis. 2. Grossly displaced oblique fracture of the distal left tibial metadiaphysis, with an additional displaced fracture of the posterior malleolus. 3. No fracture of the distal fibula. Electronically Signed   By: Bonna Gains.D.  On: 05/25/2021 15:58   DG Ankle 2 Views Left  Result Date: 05/25/2021 CLINICAL DATA:  Fracture reduction EXAM: LEFT ANKLE - 2 VIEW COMPARISON:  05/25/2021 FINDINGS: Frontal, oblique, lateral views of the left ankle are obtained. Cast material obscures underlying bony detail. The oblique distal tibial metaphyseal fracture is again identified, with 1.5 cm of lateral displacement and 0.7 cm of dorsal displacement of the distal fracture fragment remaining. Near anatomic alignment of the posterior malleolar fracture seen previously. The ankle mortise is intact. There is diffuse soft tissue swelling. IMPRESSION: 1. Improved alignment of the distal tibial fracture, with residual dorsal and lateral displacement of the distal fracture fragment as above. 2. Near anatomic alignment of a posterior malleolar fracture. 3. Casting material obscures underlying bony detail. Electronically  Signed   By: Sharlet Salina M.D.   On: 05/25/2021 17:51   DG Ankle 2 Views Left  Result Date: 05/25/2021 CLINICAL DATA:  Injury, heavy chain fell onto patient, patient fell and twisted ankle EXAM: LEFT ANKLE - 2 VIEW; LEFT TIBIA AND FIBULA - 2 VIEW COMPARISON:  None. FINDINGS: Displaced oblique fracture of the proximal left fibular diaphysis. No fracture or dislocation of the proximal tibia. Grossly displaced oblique fracture of the distal left tibial metadiaphysis, with an additional displaced fracture of the posterior malleolus. No fracture of the distal fibula. IMPRESSION: 1. Displaced oblique fracture of the proximal left fibular diaphysis. 2. Grossly displaced oblique fracture of the distal left tibial metadiaphysis, with an additional displaced fracture of the posterior malleolus. 3. No fracture of the distal fibula. Electronically Signed   By: Jearld Lesch M.D.   On: 05/25/2021 15:58   CT CERVICAL SPINE WO CONTRAST  Result Date: 05/25/2021 CLINICAL DATA:  Injury.  Facial trauma. EXAM: CT CERVICAL SPINE WITHOUT CONTRAST TECHNIQUE: Multidetector CT imaging of the cervical spine was performed without intravenous contrast. Multiplanar CT image reconstructions were also generated. RADIATION DOSE REDUCTION: This exam was performed according to the departmental dose-optimization program which includes automated exposure control, adjustment of the mA and/or kV according to patient size and/or use of iterative reconstruction technique. COMPARISON:  None. FINDINGS: Alignment: Normal except for minimal anterolisthesis of C3 on C4. Skull base and vertebrae: No acute fracture. No primary bone lesion or focal pathologic process. Soft tissues and spinal canal: No prevertebral fluid or swelling. No visible canal hematoma. Disc levels: Mild left foraminal narrowing at C3-C4 due to uncovertebral spurring. Severe facet arthropathy on the left side of C3-C4. Mild disc space narrowing and endplate changes C6-C7. Upper  chest: Not imaged Other: None IMPRESSION: 1. No acute bone abnormality in the cervical spine. 2. Mild degenerative changes particularly along the left side of C3-C4. Electronically Signed   By: Richarda Overlie M.D.   On: 05/25/2021 16:38   CT Ankle Left Wo Contrast  Result Date: 05/25/2021 CLINICAL DATA:  Ankle trauma. Fracture. Complex ankle fracture. 125 pound changed fell onto patient's right shoulder and patient twisted left ankle. EXAM: CT OF THE LEFT ANKLE WITHOUT CONTRAST TECHNIQUE: Multidetector CT imaging of the left ankle was performed according to the standard protocol. Multiplanar CT image reconstructions were also generated. RADIATION DOSE REDUCTION: This exam was performed according to the departmental dose-optimization program which includes automated exposure control, adjustment of the mA and/or kV according to patient size and/or use of iterative reconstruction technique. COMPARISON:  Left ankle radiographs 05/25/2021 FINDINGS: Bones/Joint/Cartilage There is a comminuted fracture again seen of the distal tibial diaphysis from a predominantly posterosuperior to anterior inferior and predominantly  superior lateral inferior medial orientation. There is up to approximately 12 mm posterior and approximately 5 mm lateral displacement of the distal fracture component with respect to the proximal fracture component. There is an oblique fracture of the posterior malleolus with up to approximately 3 mm AP diastasis but otherwise no significant displacement. This extends through the distal tibial articular surface. There is mild peripheral degenerative spurring of the medial aspect of the talar dome. There is a small region of multiple tiny cortical fragments indicating avulsion injury at the anterior distal fibula at the likely inferior aspect of the anterior tibiofibular ligament insertion (axial series 3, image 76). Additional possible avulsion injury at the more inferior distal fibula at the anterior  talofibular ligament insertion. Ligaments Suboptimally assessed by CT. Muscles and Tendons Grossly unremarkable. Soft tissues Mild regional soft tissue swelling. IMPRESSION:: IMPRESSION: 1. Comminuted, predominantly oblique, moderately acute displaced fracture of the distal tibial diaphysis extending into the anterior distal tibial metaphysis. 2. Nondisplaced intra-articular fracture of the posterior malleolus. 3. Small comminuted fracture fragments from avulsion injury at the anterior distal fibula likely at the inferior insertion of the anterior tibiofibular ligament. Possible avulsion injury at the more inferior distal fibula at the anterior talofibular ligament insertion. Electronically Signed   By: Neita Garnet M.D.   On: 05/25/2021 19:29   CT CHEST ABDOMEN PELVIS W CONTRAST  Result Date: 05/25/2021 CLINICAL DATA:  Work injury. 125 pound chain fell on his right shoulder. EXAM: CT CHEST, ABDOMEN, AND PELVIS WITH CONTRAST TECHNIQUE: Multidetector CT imaging of the chest, abdomen and pelvis was performed following the standard protocol during bolus administration of intravenous contrast. RADIATION DOSE REDUCTION: This exam was performed according to the departmental dose-optimization program which includes automated exposure control, adjustment of the mA and/or kV according to patient size and/or use of iterative reconstruction technique. CONTRAST:  OMNIPAQUE IOHEXOL 300 MG/ML  SOLN COMPARISON:  CT abdomen pelvis dated January 25, 2017. FINDINGS: CT CHEST FINDINGS Cardiovascular: No significant vascular findings. Normal heart size. No pericardial effusion. Mediastinum/Nodes: No enlarged mediastinal, hilar, or axillary lymph nodes. Thyroid gland, trachea, and esophagus demonstrate no significant findings. Lungs/Pleura: Minimal subsegmental atelectasis in both posterior lower lobes. No focal consolidation, pleural effusion, or pneumothorax. Musculoskeletal: Acute comminuted minimally displaced fracture of  the right scapular body and spine. No extension to the glenoid. No additional fracture. CT ABDOMEN PELVIS FINDINGS Hepatobiliary: No focal liver abnormality is seen. No gallstones, gallbladder wall thickening, or biliary dilatation. Pancreas: Unremarkable. No pancreatic ductal dilatation or surrounding inflammatory changes. Spleen: Normal in size without focal abnormality. Adrenals/Urinary Tract: Adrenal glands are unremarkable. Kidneys are normal, without renal calculi, focal lesion, or hydronephrosis. Bladder is unremarkable. Stomach/Bowel: Stomach is within normal limits. Appendix appears normal. No evidence of bowel wall thickening, distention, or inflammatory changes. Vascular/Lymphatic: No significant vascular findings are present. No enlarged abdominal or pelvic lymph nodes. Reproductive: Prostate is unremarkable. Other: No abdominal wall hernia or abnormality. No abdominopelvic ascites. No pneumoperitoneum. Musculoskeletal: Acute comminuted mildly depressed fracture of the L2 vertebral body without posterior involvement. No retropulsion. Acute nondisplaced longitudinal fracture through the right posterolateral L1 inferior endplate (series 6, image 127). IMPRESSION: 1. Acute comminuted minimally displaced fracture of the right scapular body and spine. 2. Acute comminuted mildly depressed anterior column fracture of the L2 vertebral body. 3. Acute nondisplaced longitudinal fracture through the right posterolateral L1 inferior endplate. 4.  No acute intrathoracic or intra-abdominal traumatic injury. Electronically Signed   By: Obie Dredge M.D.   On: 05/25/2021 16:42  CT 3D RECON AT SCANNER  Result Date: 05/25/2021 CLINICAL DATA:  Ankle trauma. Fracture. Complex ankle fracture. 125 pound changed fell onto patient's right shoulder and patient twisted left ankle. EXAM: CT OF THE LEFT ANKLE WITHOUT CONTRAST TECHNIQUE: Multidetector CT imaging of the left ankle was performed according to the standard  protocol. Multiplanar CT image reconstructions were also generated. RADIATION DOSE REDUCTION: This exam was performed according to the departmental dose-optimization program which includes automated exposure control, adjustment of the mA and/or kV according to patient size and/or use of iterative reconstruction technique. COMPARISON:  Left ankle radiographs 05/25/2021 FINDINGS: Bones/Joint/Cartilage There is a comminuted fracture again seen of the distal tibial diaphysis from a predominantly posterosuperior to anterior inferior and predominantly superior lateral inferior medial orientation. There is up to approximately 12 mm posterior and approximately 5 mm lateral displacement of the distal fracture component with respect to the proximal fracture component. There is an oblique fracture of the posterior malleolus with up to approximately 3 mm AP diastasis but otherwise no significant displacement. This extends through the distal tibial articular surface. There is mild peripheral degenerative spurring of the medial aspect of the talar dome. There is a small region of multiple tiny cortical fragments indicating avulsion injury at the anterior distal fibula at the likely inferior aspect of the anterior tibiofibular ligament insertion (axial series 3, image 76). Additional possible avulsion injury at the more inferior distal fibula at the anterior talofibular ligament insertion. Ligaments Suboptimally assessed by CT. Muscles and Tendons Grossly unremarkable. Soft tissues Mild regional soft tissue swelling. IMPRESSION:: IMPRESSION: 1. Comminuted, predominantly oblique, moderately acute displaced fracture of the distal tibial diaphysis extending into the anterior distal tibial metaphysis. 2. Nondisplaced intra-articular fracture of the posterior malleolus. 3. Small comminuted fracture fragments from avulsion injury at the anterior distal fibula likely at the inferior insertion of the anterior tibiofibular ligament.  Possible avulsion injury at the more inferior distal fibula at the anterior talofibular ligament insertion. Electronically Signed   By: Neita Garnet M.D.   On: 05/25/2021 19:29   DG Foot 2 Views Left  Result Date: 05/25/2021 CLINICAL DATA:  Twisted ankle EXAM: LEFT FOOT - 2 VIEW COMPARISON:  Left foot radiographs 06/30/2014 FINDINGS: The distal tibial fracture is better assessed on the dedicated ankle radiographs. No additional fracture of the foot is seen. Alignment appears normal. There is no erosive change. The soft tissues of the foot are unremarkable. IMPRESSION: No acute fracture or dislocation of the foot. Please also see separately dictated ankle radiographs. Electronically Signed   By: Lesia Hausen M.D.   On: 05/25/2021 16:03    Intake/Output      03/04 0701 03/05 0700 03/05 0701 03/06 0700   P.O. 240    I.V. (mL/kg) 1517.4 (15) 547.3 (5.4)   Total Intake(mL/kg) 1757.4 (17.4) 547.3 (5.4)   Urine (mL/kg/hr) 2696 (1.1)    Total Output 2696    Net -938.6 +547.3           Review of Systems  Constitutional:  Negative for chills and fever.  Eyes:  Negative for blurred vision.  Respiratory:  Negative for shortness of breath and wheezing.   Cardiovascular:  Negative for chest pain and palpitations.  Gastrointestinal:  Negative for abdominal pain, nausea and vomiting.  Genitourinary: Negative.   Skin: Negative.   Neurological:  Negative for tingling and sensory change.  Endo/Heme/Allergies: Negative.   Psychiatric/Behavioral: Negative.    Blood pressure 134/85, pulse 75, temperature 98.4 F (36.9 C), temperature source Oral, resp.  rate 18, height 5\' 8"  (1.727 m), weight 101.2 kg, SpO2 97 %. Physical Exam Vitals and nursing note reviewed.  Constitutional:      General: He is not in acute distress.    Appearance: Normal appearance. He is well-developed and well-groomed.     Comments: Pleasant 61 year old male appears appropriate for stated age  HENT:     Head: Normocephalic and  atraumatic.  Eyes:     Extraocular Movements: Extraocular movements intact.  Cardiovascular:     Rate and Rhythm: Normal rate and regular rhythm.     Heart sounds: S1 normal and S2 normal. No murmur heard. Pulmonary:     Effort: Pulmonary effort is normal. No respiratory distress.     Breath sounds: Normal breath sounds.  Abdominal:     General: Bowel sounds are normal.     Palpations: Abdomen is soft.     Tenderness: There is no abdominal tenderness.  Musculoskeletal:     Comments: Pelvis    no traumatic wounds or rash, no ecchymosis, stable to manual stress, nontender  Right upper extremity  Inspection: No traumatic wounds No gross deformities Sling in place Mild swelling to shoulder/shoulder girdle No ecchymosis No pulsatile lesions  Healed arthroscopy portals to R shoulder   Bony eval: TTP R scapula Clavicle, proximal humerus, elbow, forearm, wrist and hand are nontender   Soft tissue: No traumatic or complex wounds  Mild swelling R shoulder  ROM: Tolerates gentle shoulder flexion to about 45 degrees and shoulder abduction to about the same  Intact ROM R elbow, forearm, wrist and hand   Sensation: Radial, ulnar, median and axillary nerve sensation intact   Motor: Radial, ulnar, median and axillary nerve motor intact AIN and PIN motor intact Difficult to assess RTC muscle function given acute scapula fracture   Vascular: + radial pulse Good perfusion distally  Compartments soft   Left Lower Extremity  Inspection: Long leg splint and stirrup splint in place and fitting well Well padded splint Did not remove splint as pt is currently comfortable   Bony eval: TTP over ankle  Nontender to knee  No pain with log rolling of hip   Soft tissue: Did not remove splint to evaluate completely  Foot with moderate swelling and mild ecchymosis  No traumatic wounds to dorsum of foot    ROM: Not assessed  Sensation: DPN, SPN, TN sensation intact Motor: EHL,  FHL, lesser toe motor intact Splint prohibits further evaluation   Vascular: Ext warm  Good perfusion distally  + DP pulse  Compartments soft  No pain out of proportion with passive stretching of toes   Left upper extremity      shoulder, elbow, wrist, digits- no skin wounds, nontender, no instability, no blocks to motion  Sens  Ax/R/M/U intact  Mot   Ax/ R/ PIN/ M/ AIN/ U intact  Rad 2+  Right Lower Extremity              no open wounds or lesions, no swelling or ecchymosis   Nontender hip, knee, ankle and foot             No crepitus or gross motion noted with manipulation of the R leg  No knee or ankle effusion             No pain with axial loading or logrolling of the hip. Negative Stinchfield test   Knee stable to varus/ valgus and anterior/posterior stress  No pain with manipulation of the ankle or foot             No blocks to motion noted  Sens DPN, SPN, TN intact  Motor EHL, FHL, lesser toe motor, Ext, flex, evers 5/5  DP 2+, No significant edema             Compartments are soft and nontender, no pain with passive stretching       Skin:    General: Skin is warm and dry.     Capillary Refill: Capillary refill takes less than 2 seconds.  Neurological:     General: No focal deficit present.     Mental Status: He is alert and oriented to person, place, and time.     Comments: Unable to assess coordination or gait   Psychiatric:        Attention and Perception: Attention normal.        Mood and Affect: Mood normal.        Behavior: Behavior normal. Behavior is cooperative.        Thought Content: Thought content normal.        Judgment: Judgment normal.     Assessment/Plan:  61 year old male work-related accident with multiple fractures  -Work-related accident  -Multiple orthopedic injuries  Comminuted right scapular body fracture  Left distal tibia fracture including extra-articular component, posterior malleolus and left proximal fibula  fracture   Right scapula can be treated nonoperatively   Sling for comfort   Early range of motion   Can use right upper extremity to help mobilize   Ok to [employer] thru R UEx     Left distal tibia fracture, posterior malleolus fracture, left proximal tibia fracture   Patient will need surgical stabilization   Discussed with the patient multiple options.  Was unable to fully assess his soft tissue this morning.  Prepared the patient that we may need to temporize his injury with an external fixator depending on his swelling and whether or not there are fracture blisters present   Given the distal nature of his tibia fracture he will likely need to be repaired with via plate osteosynthesis.  I do not think we could get adequate fixation with an intramedullary device   After definitive fixation we will also perform stress evaluation of his ankle given the presence of a proximal fibula fracture to evaluate for syndesmotic disruption    Patient will be nonweightbearing on his left leg for 8 weeks after definitive surgery.  Could conceivably allow him to get into the pool around the 4 to 6-week mark to weight-bear and chest deep water    He will likely be out of work for a minimum 12 weeks    No evidence of compartment syndrome    Treatment options and plan discussed with patient, family in the room as well as his wife by telephone    Patient would likely need to use a walker to mobilize if we do proceed with an external fixator.  We did discuss the possibility of him trying a knee scooter once definitive fixation is performed  -Lumbar fractures  Per neurosurgery  - Pain management:  Continue with current regimen - ABL anemia/Hemodynamics  Stable  - Medical issues   Hypertension   Blood pressures look good   Diabetes    Will have DM coordinators see for education   - DVT/PE prophylaxis:  SCD R leg   Lovenox   - ID:   Periop abx   -  Metabolic Bone Disease:  Check vitamin d levels    - Activity:  NWB L leg   - FEN/GI prophylaxis/Foley/Lines:  NPO after MN  - Impediments to fracture healing:  DM  - Dispo:  OR tomorrow to address L distal tibia fracture    Mearl LatinKeith W. Kerriann Kamphuis, PA-C [phone] (C) 05/27/2021, 11:26 AM  [facility] [address] [place] [address] [phone] [place]([phone] (F)    After 5pm and on the weekends please log on to Amion, go to orthopaedics and the look under the [facility] Call for the provider(s) on call. You can also call our office at [phone] and then follow the prompts to be connected to the call team.

## 2021-05-27 NOTE — Evaluation (Signed)
Occupational Therapy Evaluation Patient Details Name: Devin Roberts MRN: [id number] DOB: May 15, 1960 Today's Date: 05/27/2021   History of Present Illness 61 yo admitted 3/3 after workplace accident where crane fell on his shoulder and he fell between 2 pallets. Pt with L1-2 fx, complex LLE fx await sx 3/6, Rt scapula fx. PMhx: HTN, HLD, DM, anxiety, obesity   Clinical Impression   PT admitted with LLE injury pending surg 3/06, R scapula fx and L 1-2 fx. Pt currently with functional limitiations due to the deficits listed below (see OT problem list). Pt currently total +2 mod (A) to stand pivot to chair at this time. Pt could progress to w/c transfers with drop arm to decrease burden of care. Pt motivated and progress well. Pt has narrowed doorways at home that will require door removed from hinge to fit w/c. Wife actively working on the home environment.  Pt will benefit from skilled OT to increase their independence and safety with adls and balance to allow discharge CIR.  *call me Olie      Recommendations for follow up therapy are one component of a multi-disciplinary discharge planning process, led by the attending physician.  Recommendations may be updated based on patient status, additional functional criteria and insurance authorization.   Follow Up Recommendations  Acute inpatient rehab (3hours/day)    Assistance Recommended at Discharge Set up Supervision/Assistance  Patient can return home with the following A lot of help with walking and/or transfers;A lot of help with bathing/dressing/bathroom;Assistance with cooking/housework;Assistance with feeding;Help with stairs or ramp for entrance;Assist for transportation    Functional Status Assessment  Patient has had a recent decline in their functional status and demonstrates the ability to make significant improvements in function in a reasonable and predictable amount of time.  Equipment Recommendations  BSC/3in1;Wheelchair  (measurements OT);Wheelchair cushion (measurements OT) (elevated LLE leg rest,)    Recommendations for Other Services Rehab consult     Precautions / Restrictions Precautions Precautions: Back;Fall Precaution Comments: sling RUE Required Braces or Orthoses: Spinal Brace;Sling Spinal Brace: Thoracolumbosacral orthotic;Applied in supine position Restrictions Weight Bearing Restrictions: Yes RUE Weight Bearing: Non weight bearing LLE Weight Bearing: Non weight bearing      Mobility Bed Mobility Overal bed mobility: Needs Assistance Bed Mobility: Rolling, Sidelying to Sit Rolling: Min assist Sidelying to sit: Min assist       General bed mobility comments: pt able to roll toward L side well and using LLE to push into roll. pt able to sustain L hip side lying. pt needs (A) to push up with L UE bed surface. pt static sitting min guard (A)    Transfers Overall transfer level: Needs assistance   Transfers: Sit to/from Stand, Bed to chair/wheelchair/BSC Sit to Stand: Mod assist, +2 physical assistance Stand pivot transfers: Mod assist, +2 physical assistance         General transfer comment: pt with LLE placed on PT foot to ensure NWB. pt able to achieve full upright posture. Pt able to pivot on R ankle to chair and (A) to lower to surface.      Balance Overall balance assessment: Needs assistance   Sitting balance-Leahy Scale: Good     Standing balance support: Single extremity supported, During functional activity Standing balance-Leahy Scale: Fair Standing balance comment: reliant on external support of therapist                           ADL either performed or assessed  with clinical judgement   ADL Overall ADL's : Needs assistance/impaired Eating/Feeding: Set up;Sitting   Grooming: Set up;Sitting   Upper Body Bathing: Moderate assistance;Sitting   Lower Body Bathing: Moderate assistance;Bed level Lower Body Bathing Details (indicate cue type and  reason): pt is able to figure 4 cross L LE and use L UE Upper Body Dressing : Total assistance Upper Body Dressing Details (indicate cue type and reason): don TLSO and sling supine Lower Body Dressing: Moderate assistance;Bed level Lower Body Dressing Details (indicate cue type and reason): pt able to don sock and doff sock on LLE in supine. pt attempting to use L UE as much as possible Toilet Transfer: +2 for physical assistance;Moderate assistance;Stand-pivot Toilet Transfer Details (indicate cue type and reason): pivot toward the R side           General ADL Comments: pt progressed from bed to chair this session and could use BSC with staff     Vision Baseline Vision/History: 0 No visual deficits       Perception     Praxis      Pertinent Vitals/Pain Pain Assessment Pain Assessment: 0-10 Pain Score: 5  Pain Location: LLE and Rt shoulder Pain Descriptors / Indicators: Guarding, Constant, Aching Pain Intervention(s): Monitored during session, Repositioned, Premedicated before session     Hand Dominance Right   Extremity/Trunk Assessment Upper Extremity Assessment Upper Extremity Assessment: RUE deficits/detail RUE Deficits / Details: sling and educated on positioning of the sling. pt educated on management of sling with L UE   Lower Extremity Assessment Lower Extremity Assessment: Defer to PT evaluation   Cervical / Trunk Assessment Cervical / Trunk Assessment: Other exceptions Cervical / Trunk Exceptions: TLSO (don when 30 degrees or higher)   Communication Communication Communication: No difficulties   Cognition Arousal/Alertness: Awake/alert Behavior During Therapy: WFL for tasks assessed/performed Overall Cognitive Status: Within Functional Limits for tasks assessed                                       General Comments  VSS    Exercises     Shoulder Instructions      Home Living Family/patient expects to be discharged to:: Private  residence Living Arrangements: Spouse/significant other Available Help at Discharge: Family;Available 24 hours/day Type of Home: House Home Access: Stairs to enter [employer] of Steps: 2   Home Layout: Two level;Able to live on main level with bedroom/bathroom     Bathroom Shower/Tub: Producer, television/film/video: Standard     Home Equipment: None   Additional Comments: wife measure the bathroom distances and due to a pony wall in the bathroom between the toilet and the shower pt can not access the toilet via wheelchair. Pt will likely need BSC. p thas adustable bed at home. Pt has doorway if the doors are removed the w/c can pass the door frame with w/c      Prior Functioning/Environment Prior Level of Function : Independent/Modified Independent               ADLs Comments: working full-time        OT Problem List: Decreased strength;Decreased activity tolerance;Impaired balance (sitting and/or standing);Decreased safety awareness;Decreased knowledge of use of DME or AE;Decreased knowledge of precautions;Pain;Impaired UE functional use      OT Treatment/Interventions: Self-care/ADL training;Therapeutic exercise;Energy conservation;DME and/or AE instruction;Manual therapy;Modalities;Therapeutic activities;Patient/family education;Balance training    OT Goals(Current goals  can be found in the care plan section) Acute Rehab OT Goals Patient Stated Goal: to get through the surgery tomorrow OT Goal Formulation: With patient Time For Goal Achievement: 06/10/21 Potential to Achieve Goals: Good  OT Frequency: Min 2X/week    Co-evaluation PT/OT/SLP Co-Evaluation/Treatment: Yes Reason for Co-Treatment: Complexity of the patient's impairments (multi-system involvement);Necessary to address cognition/behavior during functional activity;For patient/therapist safety;To address functional/ADL transfers   OT goals addressed during session: ADL's and self-care;Proper  use of Adaptive equipment and DME;Strengthening/ROM      AM-PAC OT "6 Clicks" Daily Activity     Outcome Measure Help from another person eating meals?: A Little Help from another person taking care of personal grooming?: A Little Help from another person toileting, which includes using toliet, bedpan, or urinal?: A Lot Help from another person bathing (including washing, rinsing, drying)?: A Lot Help from another person to put on and taking off regular upper body clothing?: A Lot Help from another person to put on and taking off regular lower body clothing?: A Lot 6 Click Score: 14   End of Session Equipment Utilized During Treatment: Gait belt;Back brace Nurse Communication: Mobility status;Precautions;Weight bearing status  Activity Tolerance: Patient tolerated treatment well Patient left: in chair;with call bell/phone within reach;with chair alarm set (family present and then returning back to the room at the end of session)  OT Visit Diagnosis: Unsteadiness on feet (R26.81);Muscle weakness (generalized) (M62.81);Pain Pain - Right/Left: Right Pain - part of body: Arm (LLE)                Time: 9528-4132 OT Time Calculation (min): 37 min Charges:  OT General Charges $OT Visit: 1 Visit OT Evaluation $OT Eval Moderate Complexity: 1 Mod   Brynn, OTR/L  Acute Rehabilitation Services Pager: [phone] Office: [phone] .   Mateo Flow 05/27/2021, 10:18 AM

## 2021-05-27 NOTE — Progress Notes (Signed)
Progress Note     Subjective: Devin Roberts doing well with pain control. Was able to work with Devin Roberts yesterday. Plans for OR with ortho tomorrow. Wife at bedside. Denies chest pain, nausea, abdominal pain.   Objective: Vital signs in last 24 hours: Temp:  [97.9 F (36.6 C)-98.6 F (37 C)] 98.4 F (36.9 C) (03/05 0726) Pulse Rate:  [70-76] 75 (03/05 0726) Resp:  [17-18] 18 (03/05 0726) BP: (134-145)/(83-97) 134/85 (03/05 0726) SpO2:  [96 %-97 %] 97 % (03/05 0726) Last BM Date : 05/25/21  Intake/Output from previous day: 03/04 0701 - 03/05 0700 In: 1757.4 [P.O.:240; I.V.:1517.4] Out: 2696 [Urine:2696] Intake/Output this shift: Total I/O In: 547.3 [I.V.:547.3] Out: -   PE: General: pleasant, WD, WN male who is laying in bed in NAD HEENT: head is normocephalic, atraumatic.  Sclera are noninjected. Ears and nose without any masses or lesions.  Mouth is pink and moist Heart: regular, rate, and rhythm.  Normal s1,s2. No obvious murmurs, gallops, or rubs noted.  Palpable radial pulses bilaterally Lungs: CTAB, no wheezes, rhonchi, or rales noted.  Respiratory effort nonlabored Abd: soft, NT, ND, +BS, no masses, hernias, or organomegaly MS: RUE in sling and ROM intact in R elbow and R wrist, ROM at R shoulder limited by pain, R hand NVI; LLE in splint, L toes WWP and NVI Skin: warm and dry with no masses, lesions, or rashes Neuro: Cranial nerves 2-12 grossly intact, sensation is normal throughout Psych: A&Ox3 with an appropriate affect.    Lab Results:  Recent Labs    05/26/21 0043 05/27/21 0026  WBC 13.2* 11.3*  HGB 13.7 13.2  HCT 38.4* 38.3*  PLT 183 187    BMET Recent Labs    05/26/21 0043 05/27/21 0026  NA 135 137  K 3.7 3.9  CL 104 104  CO2 20* 25  GLUCOSE 151* 145*  BUN 29* 18  CREATININE 1.10 0.89  CALCIUM 8.4* 8.7*    Devin Roberts/INR No results for input(s): LABPROT, INR in the last 72 hours. CMP     Component Value Date/Time   NA 137 05/27/2021 0026   NA 129 (A)  03/29/2015 0000   K 3.9 05/27/2021 0026   CL 104 05/27/2021 0026   CO2 25 05/27/2021 0026   GLUCOSE 145 (H) 05/27/2021 0026   BUN 18 05/27/2021 0026   BUN 26 (A) 03/29/2015 0000   CREATININE 0.89 05/27/2021 0026   CALCIUM 8.7 (L) 05/27/2021 0026   PROT 6.5 05/25/2021 1453   ALBUMIN 3.8 05/25/2021 1453   AST 28 05/25/2021 1453   ALT 29 05/25/2021 1453   ALKPHOS 39 05/25/2021 1453   BILITOT 0.7 05/25/2021 1453   GFRNONAA >60 05/27/2021 0026   GFRAA 53 (L) 01/25/2017 1834   Lipase  No results found for: LIPASE     Studies/Results: DG Chest 1 View  Result Date: 05/25/2021 CLINICAL DATA:  Status post fall, loss of consciousness, head trauma EXAM: CHEST  1 VIEW COMPARISON:  04/16/2016 FINDINGS: The heart size and mediastinal contours are within normal limits. Both lungs are clear. The visualized skeletal structures are unremarkable. IMPRESSION: No active disease. Electronically Signed   By: Kathreen Devoid M.D.   On: 05/25/2021 15:55   DG Shoulder Right  Result Date: 05/25/2021 CLINICAL DATA:  125lb chain fell onto Devin Roberts right shoulder then Devin Roberts slipped and twisted his left ankle. Devin Roberts denies LOC or head trauma. Devin Roberts arrives with splint on left ankle with left ankle deformity and right shoulder deformity. Devin Roberts c/o left  foot, right shoulder, left ankle, and left lower leg pain. No hx of prior injuries or surgeries. EXAM: RIGHT SHOULDER - 2+ VIEW COMPARISON:  None. FINDINGS: Nondisplaced fracture of the scapula, extending obliquely from the lateral border 1-2 cm below the glenoid, to the superior scapula near the scapular notch. No other fractures.  No bone lesions. Glenohumeral and AC joints are normally spaced and aligned. IMPRESSION: 1. Nondisplaced fracture of the right scapula. 2. No other fractures.  No dislocation. Electronically Signed   By: Lajean Manes M.D.   On: 05/25/2021 15:57   DG Tibia/Fibula Left  Result Date: 05/25/2021 CLINICAL DATA:  Injury, heavy chain fell onto patient, patient fell  and twisted ankle EXAM: LEFT ANKLE - 2 VIEW; LEFT TIBIA AND FIBULA - 2 VIEW COMPARISON:  None. FINDINGS: Displaced oblique fracture of the proximal left fibular diaphysis. No fracture or dislocation of the proximal tibia. Grossly displaced oblique fracture of the distal left tibial metadiaphysis, with an additional displaced fracture of the posterior malleolus. No fracture of the distal fibula. IMPRESSION: 1. Displaced oblique fracture of the proximal left fibular diaphysis. 2. Grossly displaced oblique fracture of the distal left tibial metadiaphysis, with an additional displaced fracture of the posterior malleolus. 3. No fracture of the distal fibula. Electronically Signed   By: Delanna Ahmadi M.D.   On: 05/25/2021 15:58   DG Ankle 2 Views Left  Result Date: 05/25/2021 CLINICAL DATA:  Fracture reduction EXAM: LEFT ANKLE - 2 VIEW COMPARISON:  05/25/2021 FINDINGS: Frontal, oblique, lateral views of the left ankle are obtained. Cast material obscures underlying bony detail. The oblique distal tibial metaphyseal fracture is again identified, with 1.5 cm of lateral displacement and 0.7 cm of dorsal displacement of the distal fracture fragment remaining. Near anatomic alignment of the posterior malleolar fracture seen previously. The ankle mortise is intact. There is diffuse soft tissue swelling. IMPRESSION: 1. Improved alignment of the distal tibial fracture, with residual dorsal and lateral displacement of the distal fracture fragment as above. 2. Near anatomic alignment of a posterior malleolar fracture. 3. Casting material obscures underlying bony detail. Electronically Signed   By: Randa Ngo M.D.   On: 05/25/2021 17:51   DG Ankle 2 Views Left  Result Date: 05/25/2021 CLINICAL DATA:  Injury, heavy chain fell onto patient, patient fell and twisted ankle EXAM: LEFT ANKLE - 2 VIEW; LEFT TIBIA AND FIBULA - 2 VIEW COMPARISON:  None. FINDINGS: Displaced oblique fracture of the proximal left fibular diaphysis. No  fracture or dislocation of the proximal tibia. Grossly displaced oblique fracture of the distal left tibial metadiaphysis, with an additional displaced fracture of the posterior malleolus. No fracture of the distal fibula. IMPRESSION: 1. Displaced oblique fracture of the proximal left fibular diaphysis. 2. Grossly displaced oblique fracture of the distal left tibial metadiaphysis, with an additional displaced fracture of the posterior malleolus. 3. No fracture of the distal fibula. Electronically Signed   By: Delanna Ahmadi M.D.   On: 05/25/2021 15:58   CT CERVICAL SPINE WO CONTRAST  Result Date: 05/25/2021 CLINICAL DATA:  Injury.  Facial trauma. EXAM: CT CERVICAL SPINE WITHOUT CONTRAST TECHNIQUE: Multidetector CT imaging of the cervical spine was performed without intravenous contrast. Multiplanar CT image reconstructions were also generated. RADIATION DOSE REDUCTION: This exam was performed according to the departmental dose-optimization program which includes automated exposure control, adjustment of the mA and/or kV according to patient size and/or use of iterative reconstruction technique. COMPARISON:  None. FINDINGS: Alignment: Normal except for minimal anterolisthesis of C3  on C4. Skull base and vertebrae: No acute fracture. No primary bone lesion or focal pathologic process. Soft tissues and spinal canal: No prevertebral fluid or swelling. No visible canal hematoma. Disc levels: Mild left foraminal narrowing at C3-C4 due to uncovertebral spurring. Severe facet arthropathy on the left side of C3-C4. Mild disc space narrowing and endplate changes [id number]. Upper chest: Not imaged Other: None IMPRESSION: 1. No acute bone abnormality in the cervical spine. 2. Mild degenerative changes particularly along the left side of C3-C4. Electronically Signed   By: Markus Daft M.D.   On: 05/25/2021 16:38   CT Ankle Left Wo Contrast  Result Date: 05/25/2021 CLINICAL DATA:  Ankle trauma. Fracture. Complex ankle fracture.  125 pound changed fell onto patient's right shoulder and patient twisted left ankle. EXAM: CT OF THE LEFT ANKLE WITHOUT CONTRAST TECHNIQUE: Multidetector CT imaging of the left ankle was performed according to the standard protocol. Multiplanar CT image reconstructions were also generated. RADIATION DOSE REDUCTION: This exam was performed according to the departmental dose-optimization program which includes automated exposure control, adjustment of the mA and/or kV according to patient size and/or use of iterative reconstruction technique. COMPARISON:  Left ankle radiographs 05/25/2021 FINDINGS: Bones/Joint/Cartilage There is a comminuted fracture again seen of the distal tibial diaphysis from a predominantly posterosuperior to anterior inferior and predominantly superior lateral inferior medial orientation. There is up to approximately 12 mm posterior and approximately 5 mm lateral displacement of the distal fracture component with respect to the proximal fracture component. There is an oblique fracture of the posterior malleolus with up to approximately 3 mm AP diastasis but otherwise no significant displacement. This extends through the distal tibial articular surface. There is mild peripheral degenerative spurring of the medial aspect of the talar dome. There is a small region of multiple tiny cortical fragments indicating avulsion injury at the anterior distal fibula at the likely inferior aspect of the anterior tibiofibular ligament insertion (axial series 3, image 76). Additional possible avulsion injury at the more inferior distal fibula at the anterior talofibular ligament insertion. Ligaments Suboptimally assessed by CT. Muscles and Tendons Grossly unremarkable. Soft tissues Mild regional soft tissue swelling. IMPRESSION:: IMPRESSION: 1. Comminuted, predominantly oblique, moderately acute displaced fracture of the distal tibial diaphysis extending into the anterior distal tibial metaphysis. 2.  Nondisplaced intra-articular fracture of the posterior malleolus. 3. Small comminuted fracture fragments from avulsion injury at the anterior distal fibula likely at the inferior insertion of the anterior tibiofibular ligament. Possible avulsion injury at the more inferior distal fibula at the anterior talofibular ligament insertion. Electronically Signed   By: Yvonne Kendall M.D.   On: 05/25/2021 19:29   CT CHEST ABDOMEN PELVIS W CONTRAST  Result Date: 05/25/2021 CLINICAL DATA:  Work injury. 125 pound chain fell on his right shoulder. EXAM: CT CHEST, ABDOMEN, AND PELVIS WITH CONTRAST TECHNIQUE: Multidetector CT imaging of the chest, abdomen and pelvis was performed following the standard protocol during bolus administration of intravenous contrast. RADIATION DOSE REDUCTION: This exam was performed according to the departmental dose-optimization program which includes automated exposure control, adjustment of the mA and/or kV according to patient size and/or use of iterative reconstruction technique. CONTRAST:  161mL OMNIPAQUE IOHEXOL 300 MG/ML  SOLN COMPARISON:  CT abdomen pelvis dated January 25, 2017. FINDINGS: CT CHEST FINDINGS Cardiovascular: No significant vascular findings. Normal heart size. No pericardial effusion. Mediastinum/Nodes: No enlarged mediastinal, hilar, or axillary lymph nodes. Thyroid gland, trachea, and esophagus demonstrate no significant findings. Lungs/Pleura: Minimal subsegmental atelectasis in both posterior  lower lobes. No focal consolidation, pleural effusion, or pneumothorax. Musculoskeletal: Acute comminuted minimally displaced fracture of the right scapular body and spine. No extension to the glenoid. No additional fracture. CT ABDOMEN PELVIS FINDINGS Hepatobiliary: No focal liver abnormality is seen. No gallstones, gallbladder wall thickening, or biliary dilatation. Pancreas: Unremarkable. No pancreatic ductal dilatation or surrounding inflammatory changes. Spleen: Normal in size  without focal abnormality. Adrenals/Urinary Tract: Adrenal glands are unremarkable. Kidneys are normal, without renal calculi, focal lesion, or hydronephrosis. Bladder is unremarkable. Stomach/Bowel: Stomach is within normal limits. Appendix appears normal. No evidence of bowel wall thickening, distention, or inflammatory changes. Vascular/Lymphatic: No significant vascular findings are present. No enlarged abdominal or pelvic lymph nodes. Reproductive: Prostate is unremarkable. Other: No abdominal wall hernia or abnormality. No abdominopelvic ascites. No pneumoperitoneum. Musculoskeletal: Acute comminuted mildly depressed fracture of the L2 vertebral body without posterior involvement. No retropulsion. Acute nondisplaced longitudinal fracture through the right posterolateral L1 inferior endplate (series 6, image 127). IMPRESSION: 1. Acute comminuted minimally displaced fracture of the right scapular body and spine. 2. Acute comminuted mildly depressed anterior column fracture of the L2 vertebral body. 3. Acute nondisplaced longitudinal fracture through the right posterolateral L1 inferior endplate. 4.  No acute intrathoracic or intra-abdominal traumatic injury. Electronically Signed   By: Titus Dubin M.D.   On: 05/25/2021 16:42   CT 3D RECON AT SCANNER  Result Date: 05/25/2021 CLINICAL DATA:  Ankle trauma. Fracture. Complex ankle fracture. 125 pound changed fell onto patient's right shoulder and patient twisted left ankle. EXAM: CT OF THE LEFT ANKLE WITHOUT CONTRAST TECHNIQUE: Multidetector CT imaging of the left ankle was performed according to the standard protocol. Multiplanar CT image reconstructions were also generated. RADIATION DOSE REDUCTION: This exam was performed according to the departmental dose-optimization program which includes automated exposure control, adjustment of the mA and/or kV according to patient size and/or use of iterative reconstruction technique. COMPARISON:  Left ankle  radiographs 05/25/2021 FINDINGS: Bones/Joint/Cartilage There is a comminuted fracture again seen of the distal tibial diaphysis from a predominantly posterosuperior to anterior inferior and predominantly superior lateral inferior medial orientation. There is up to approximately 12 mm posterior and approximately 5 mm lateral displacement of the distal fracture component with respect to the proximal fracture component. There is an oblique fracture of the posterior malleolus with up to approximately 3 mm AP diastasis but otherwise no significant displacement. This extends through the distal tibial articular surface. There is mild peripheral degenerative spurring of the medial aspect of the talar dome. There is a small region of multiple tiny cortical fragments indicating avulsion injury at the anterior distal fibula at the likely inferior aspect of the anterior tibiofibular ligament insertion (axial series 3, image 76). Additional possible avulsion injury at the more inferior distal fibula at the anterior talofibular ligament insertion. Ligaments Suboptimally assessed by CT. Muscles and Tendons Grossly unremarkable. Soft tissues Mild regional soft tissue swelling. IMPRESSION:: IMPRESSION: 1. Comminuted, predominantly oblique, moderately acute displaced fracture of the distal tibial diaphysis extending into the anterior distal tibial metaphysis. 2. Nondisplaced intra-articular fracture of the posterior malleolus. 3. Small comminuted fracture fragments from avulsion injury at the anterior distal fibula likely at the inferior insertion of the anterior tibiofibular ligament. Possible avulsion injury at the more inferior distal fibula at the anterior talofibular ligament insertion. Electronically Signed   By: Yvonne Kendall M.D.   On: 05/25/2021 19:29   DG Foot 2 Views Left  Result Date: 05/25/2021 CLINICAL DATA:  Twisted ankle EXAM: LEFT FOOT - 2  VIEW COMPARISON:  Left foot radiographs 06/30/2014 FINDINGS: The distal  tibial fracture is better assessed on the dedicated ankle radiographs. No additional fracture of the foot is seen. Alignment appears normal. There is no erosive change. The soft tissues of the foot are unremarkable. IMPRESSION: No acute fracture or dislocation of the foot. Please also see separately dictated ankle radiographs. Electronically Signed   By: Valetta Mole M.D.   On: 05/25/2021 16:03    Anti-infectives: Anti-infectives (From admission, onward)    None        Assessment/Plan Workplace fall/injury R scapula fx - per ortho, NWB RUE, sling L2 vertebral body fx/L1 inf endplate fx  - per NS, formal consult pending but recommend TLSO  L tib-fib and L malleolus fx - per ortho, NWB LLE, splint, likely OR Monday with ortho trauma  AKI - improving, continue to monitor Hyperglycemia - SSI, monitor HTN - home meds HLD - home meds  FEN: reg diet, SLIV - NPO after MN, will have IVF for him while NPO VTE: SCDs, LMWH ID: no current abx  Dispo: OR with ortho tomorrow. Continue Devin Roberts/OT  LOS: 2 days   I reviewed Consultant Ortho/NS notes, last 24 h vitals and pain scores, last 48 h intake and output, last 24 h labs and trends, and last 24 h imaging results.  This care required moderate level of medical decision making.    Norm Parcel, [facility] Surgery 05/27/2021, 8:23 AM Please see Amion for pager number during day hours 7:00am-4:30pm

## 2021-05-28 ENCOUNTER — Encounter (HOSPITAL_COMMUNITY): Admission: EM | Disposition: A | Discharge status: Rehabilitation Facility | Payer: Self-pay | Source: Home / Self Care

## 2021-05-28 ENCOUNTER — Other Ambulatory Visit: Payer: Self-pay

## 2021-05-28 ENCOUNTER — Inpatient Hospital Stay (HOSPITAL_COMMUNITY): Payer: Worker's Compensation | Admitting: Certified Registered Nurse Anesthetist

## 2021-05-28 ENCOUNTER — Inpatient Hospital Stay (HOSPITAL_COMMUNITY): Payer: Worker's Compensation

## 2021-05-28 ENCOUNTER — Encounter (HOSPITAL_COMMUNITY): Payer: Self-pay

## 2021-05-28 DIAGNOSIS — S82832A Other fracture of upper and lower end of left fibula, initial encounter for closed fracture: Secondary | ICD-10-CM | POA: Diagnosis not present

## 2021-05-28 DIAGNOSIS — S82872A Displaced pilon fracture of left tibia, initial encounter for closed fracture: Secondary | ICD-10-CM | POA: Diagnosis not present

## 2021-05-28 HISTORY — PX: ORIF ANKLE FRACTURE: SHX5408

## 2021-05-28 LAB — COMPREHENSIVE METABOLIC PANEL
ALT: 23 U/L (ref 0–44)
AST: 30 U/L (ref 15–41)
Albumin: 3.1 g/dL — ABNORMAL LOW (ref 3.5–5.0)
Alkaline Phosphatase: 37 U/L — ABNORMAL LOW (ref 38–126)
Anion gap: 6 (ref 5–15)
BUN: 18 mg/dL (ref 6–20)
CO2: 29 mmol/L (ref 22–32)
Calcium: 8.9 mg/dL (ref 8.9–10.3)
Chloride: 104 mmol/L (ref 98–111)
Creatinine, Ser: 1 mg/dL (ref 0.61–1.24)
GFR, Estimated: >60 mL/min (ref >60)
Glucose, Bld: 143 mg/dL — ABNORMAL HIGH (ref 70–99)
Potassium: 3.8 mmol/L (ref 3.5–5.1)
Sodium: 139 mmol/L (ref 135–145)
Total Bilirubin: 0.8 mg/dL (ref 0.3–1.2)
Total Protein: 6.1 g/dL — ABNORMAL LOW (ref 6.5–8.1)

## 2021-05-28 LAB — GLUCOSE, CAPILLARY
Glucose-Capillary: 134 mg/dL — ABNORMAL HIGH (ref 70–99)
Glucose-Capillary: 148 mg/dL — ABNORMAL HIGH (ref 70–99)
Glucose-Capillary: 179 mg/dL — ABNORMAL HIGH (ref 70–99)
Glucose-Capillary: 154 mg/dL — ABNORMAL HIGH (ref 70–99)

## 2021-05-28 LAB — CBC
HCT: 37.1 % — ABNORMAL LOW (ref 39.0–52.0)
Hemoglobin: 13 g/dL (ref 13.0–17.0)
MCH: 32.6 pg (ref 26.0–34.0)
MCHC: 35 g/dL (ref 30.0–36.0)
MCV: 93 fL (ref 80.0–100.0)
Platelets: 181 10*3/uL (ref 150–400)
RBC: 3.99 MIL/uL — ABNORMAL LOW (ref 4.22–5.81)
RDW: 12.1 % (ref 11.5–15.5)
WBC: 10.6 10*3/uL — ABNORMAL HIGH (ref 4.0–10.5)
nRBC: 0 % (ref 0.0–0.2)

## 2021-05-28 LAB — VITAMIN D 25 HYDROXY (VIT D DEFICIENCY, FRACTURES): Vit D, 25-Hydroxy: 35.55 ng/mL (ref 30–100)

## 2021-05-28 SURGERY — OPEN REDUCTION INTERNAL FIXATION (ORIF) ANKLE FRACTURE
Anesthesia: General | Site: Ankle | Laterality: Left

## 2021-05-28 MED ORDER — PROPOFOL 10 MG/ML IV BOLUS
INTRAVENOUS | Status: AC
  Filled 2021-05-28: qty 20

## 2021-05-28 MED ORDER — HYDROMORPHONE HCL 1 MG/ML IJ SOLN
INTRAMUSCULAR | Status: AC
  Filled 2021-05-28: qty 1

## 2021-05-28 MED ORDER — ONDANSETRON HCL 4 MG/2ML IJ SOLN: 4.0000 mg | Freq: Once | INTRAMUSCULAR | Status: DC | PRN

## 2021-05-28 MED ORDER — LIDOCAINE 2% (20 MG/ML) 5 ML SYRINGE
INTRAMUSCULAR | Status: AC
  Filled 2021-05-28: qty 5

## 2021-05-28 MED ORDER — 0.9 % SODIUM CHLORIDE (POUR BTL) OPTIME
TOPICAL | Status: DC | PRN
  Administered 2021-05-28: 1000 mL

## 2021-05-28 MED ORDER — KETOROLAC TROMETHAMINE 30 MG/ML IJ SOLN
INTRAMUSCULAR | Status: AC
  Filled 2021-05-28: qty 1

## 2021-05-28 MED ORDER — DEXAMETHASONE SODIUM PHOSPHATE 10 MG/ML IJ SOLN
INTRAMUSCULAR | Status: AC
  Filled 2021-05-28: qty 1

## 2021-05-28 MED ORDER — ONDANSETRON HCL 4 MG/2ML IJ SOLN
INTRAMUSCULAR | Status: AC
  Filled 2021-05-28: qty 2

## 2021-05-28 MED ORDER — LIDOCAINE 2% (20 MG/ML) 5 ML SYRINGE
INTRAMUSCULAR | Status: DC | PRN
Start: 2021-05-28 — End: 2021-05-28
  Administered 2021-05-28: 100 mg via INTRAVENOUS

## 2021-05-28 MED ORDER — DEXAMETHASONE SODIUM PHOSPHATE 10 MG/ML IJ SOLN
INTRAMUSCULAR | Status: DC | PRN
  Administered 2021-05-28: 5 mg via INTRAVENOUS

## 2021-05-28 MED ORDER — ONDANSETRON HCL 4 MG/2ML IJ SOLN
INTRAMUSCULAR | Status: DC | PRN
  Administered 2021-05-28: 4 mg via INTRAVENOUS

## 2021-05-28 MED ORDER — ROCURONIUM BROMIDE 10 MG/ML (PF) SYRINGE
PREFILLED_SYRINGE | INTRAVENOUS | Status: AC
  Filled 2021-05-28: qty 10

## 2021-05-28 MED ORDER — HYDROMORPHONE HCL 1 MG/ML IJ SOLN
INTRAMUSCULAR | Status: DC | PRN
  Administered 2021-05-28: .5 mg via INTRAVENOUS

## 2021-05-28 MED ORDER — LACTATED RINGERS IV SOLN: INTRAVENOUS | Status: DC

## 2021-05-28 MED ORDER — CEFAZOLIN SODIUM-DEXTROSE 2-4 GM/100ML-% IV SOLN
2.0000 g | Freq: Three times a day (TID) | INTRAVENOUS | Status: AC
  Administered 2021-05-28 – 2021-05-29 (×3): 2 g via INTRAVENOUS
  Filled 2021-05-28 (×4): qty 100

## 2021-05-28 MED ORDER — MIDAZOLAM HCL 2 MG/2ML IJ SOLN
INTRAMUSCULAR | Status: DC | PRN
  Administered 2021-05-28: 2 mg via INTRAVENOUS

## 2021-05-28 MED ORDER — FENTANYL CITRATE (PF) 250 MCG/5ML IJ SOLN
INTRAMUSCULAR | Status: AC
  Filled 2021-05-28: qty 5

## 2021-05-28 MED ORDER — CHLORHEXIDINE GLUCONATE 0.12 % MT SOLN
OROMUCOSAL | Status: AC
  Administered 2021-05-28: 15 mL via OROMUCOSAL
  Filled 2021-05-28: qty 15

## 2021-05-28 MED ORDER — ORAL CARE MOUTH RINSE: 15.0000 mL | Freq: Once | OROMUCOSAL | Status: AC

## 2021-05-28 MED ORDER — KETOROLAC TROMETHAMINE 30 MG/ML IJ SOLN
30.0000 mg | Freq: Once | INTRAMUSCULAR | Status: AC | PRN
  Administered 2021-05-28: 30 mg via INTRAVENOUS

## 2021-05-28 MED ORDER — SUGAMMADEX SODIUM 200 MG/2ML IV SOLN
INTRAVENOUS | Status: DC | PRN
  Administered 2021-05-28: 200 mg via INTRAVENOUS

## 2021-05-28 MED ORDER — FENTANYL CITRATE (PF) 250 MCG/5ML IJ SOLN
INTRAMUSCULAR | Status: DC | PRN
  Administered 2021-05-28: 100 ug via INTRAVENOUS
  Administered 2021-05-28 (×3): 50 ug via INTRAVENOUS

## 2021-05-28 MED ORDER — HYDROMORPHONE HCL 1 MG/ML IJ SOLN
INTRAMUSCULAR | Status: AC
  Filled 2021-05-28: qty 0.5

## 2021-05-28 MED ORDER — HYDROMORPHONE HCL 1 MG/ML IJ SOLN
0.2500 mg | INTRAMUSCULAR | Status: DC | PRN
  Administered 2021-05-28 (×4): 0.5 mg via INTRAVENOUS

## 2021-05-28 MED ORDER — MIDAZOLAM HCL 2 MG/2ML IJ SOLN
INTRAMUSCULAR | Status: AC
  Filled 2021-05-28: qty 2

## 2021-05-28 MED ORDER — CHLORHEXIDINE GLUCONATE 0.12 % MT SOLN: 15.0000 mL | Freq: Once | OROMUCOSAL | Status: AC

## 2021-05-28 MED ORDER — PROPOFOL 10 MG/ML IV BOLUS
INTRAVENOUS | Status: DC | PRN
  Administered 2021-05-28: 150 mg via INTRAVENOUS

## 2021-05-28 MED ORDER — POLYETHYLENE GLYCOL 3350 17 G PO PACK
17.0000 g | PACK | Freq: Every day | ORAL | Status: DC
  Administered 2021-05-29 – 2021-05-30 (×2): 17 g via ORAL
  Filled 2021-05-28 (×3): qty 1

## 2021-05-28 MED ORDER — ROCURONIUM BROMIDE 10 MG/ML (PF) SYRINGE
PREFILLED_SYRINGE | INTRAVENOUS | Status: DC | PRN
  Administered 2021-05-28: 20 mg via INTRAVENOUS
  Administered 2021-05-28: 70 mg via INTRAVENOUS

## 2021-05-28 SURGICAL SUPPLY — 82 items
BAG COUNTER SPONGE SURGICOUNT (BAG) ×2 IMPLANT
BAG SPNG CNTER NS LX DISP (BAG) ×1
BANDAGE ESMARK 6X9 LF (GAUZE/BANDAGES/DRESSINGS) ×1 IMPLANT
BIT DRILL 2.5X2.75 QC CALB (BIT) ×1 IMPLANT
BIT DRILL 2.9 CANN QC NONSTRL (BIT) ×1 IMPLANT
BIT DRILL 3.5X5.5 QC CALB (BIT) ×1 IMPLANT
BNDG CMPR 9X6 STRL LF SNTH (GAUZE/BANDAGES/DRESSINGS) ×1
BNDG ELASTIC 4X5.8 VLCR STR LF (GAUZE/BANDAGES/DRESSINGS) IMPLANT
BNDG ELASTIC 6X5.8 VLCR STR LF (GAUZE/BANDAGES/DRESSINGS) ×1 IMPLANT
BNDG ESMARK 6X9 LF (GAUZE/BANDAGES/DRESSINGS) ×2
BNDG GAUZE ELAST 4 BULKY (GAUZE/BANDAGES/DRESSINGS) ×3 IMPLANT
BRUSH SCRUB EZ PLAIN DRY (MISCELLANEOUS) ×4 IMPLANT
COVER MAYO STAND STRL (DRAPES) ×2 IMPLANT
COVER SURGICAL LIGHT HANDLE (MISCELLANEOUS) ×2 IMPLANT
CUFF TOURN SGL QUICK 34 (TOURNIQUET CUFF) ×2
CUFF TRNQT CYL 34X4.125X (TOURNIQUET CUFF) ×1 IMPLANT
DRAPE C-ARM 42X72 X-RAY (DRAPES) ×2 IMPLANT
DRAPE C-ARMOR (DRAPES) ×2 IMPLANT
DRAPE HALF SHEET 40X57 (DRAPES) ×2 IMPLANT
DRAPE U-SHAPE 47X51 STRL (DRAPES) ×2 IMPLANT
DRSG EMULSION OIL 3X3 NADH (GAUZE/BANDAGES/DRESSINGS) IMPLANT
DRSG MEPITEL 4X7.2 (GAUZE/BANDAGES/DRESSINGS) ×1 IMPLANT
ELECT REM PT RETURN 9FT ADLT (ELECTROSURGICAL) ×2
ELECTRODE REM PT RTRN 9FT ADLT (ELECTROSURGICAL) ×1 IMPLANT
GAUZE SPONGE 4X4 12PLY STRL (GAUZE/BANDAGES/DRESSINGS) ×4 IMPLANT
GAUZE SPONGE 4X4 12PLY STRL LF (GAUZE/BANDAGES/DRESSINGS) ×1 IMPLANT
GLOVE SRG 8 PF TXTR STRL LF DI (GLOVE) ×1 IMPLANT
GLOVE SURG ENC MOIS LTX SZ8 (GLOVE) ×2 IMPLANT
GLOVE SURG ORTHO LTX SZ7.5 (GLOVE) ×4 IMPLANT
GLOVE SURG UNDER POLY LF SZ7.5 (GLOVE) ×2 IMPLANT
GLOVE SURG UNDER POLY LF SZ8 (GLOVE) ×2
GOWN STRL REUS W/ TWL LRG LVL3 (GOWN DISPOSABLE) ×2 IMPLANT
GOWN STRL REUS W/ TWL XL LVL3 (GOWN DISPOSABLE) ×1 IMPLANT
GOWN STRL REUS W/TWL LRG LVL3 (GOWN DISPOSABLE) ×4
GOWN STRL REUS W/TWL XL LVL3 (GOWN DISPOSABLE) ×2
K-WIRE ACE 1.6X6 (WIRE) ×4
KIT BASIN OR (CUSTOM PROCEDURE TRAY) ×2 IMPLANT
KIT TURNOVER KIT B (KITS) ×2 IMPLANT
KWIRE ACE 1.6X6 (WIRE) IMPLANT
MANIFOLD NEPTUNE II (INSTRUMENTS) ×2 IMPLANT
NDL HYPO 21X1.5 SAFETY (NEEDLE) IMPLANT
NEEDLE HYPO 21X1.5 SAFETY (NEEDLE) IMPLANT
NS IRRIG 1000ML POUR BTL (IV SOLUTION) ×2 IMPLANT
PACK GENERAL/GYN (CUSTOM PROCEDURE TRAY) ×2 IMPLANT
PACK ORTHO EXTREMITY (CUSTOM PROCEDURE TRAY) ×2 IMPLANT
PAD ARMBOARD 7.5X6 YLW CONV (MISCELLANEOUS) ×4 IMPLANT
PAD CAST 4YDX4 CTTN HI CHSV (CAST SUPPLIES) IMPLANT
PADDING CAST ABS 6INX4YD NS (CAST SUPPLIES) ×1
PADDING CAST ABS COTTON 6X4 NS (CAST SUPPLIES) IMPLANT
PADDING CAST COTTON 4X4 STRL (CAST SUPPLIES)
PADDING CAST COTTON 6X4 STRL (CAST SUPPLIES) IMPLANT
PLATE 9H 184 LT MED DIST TIB (Plate) ×1 IMPLANT
PLATE ACE 3.5MM 2HOLE (Plate) ×1 IMPLANT
SCREW CORT FT 32X3.5XNONLOCK (Screw) IMPLANT
SCREW CORTICAL 3.5MM  28MM (Screw) ×2 IMPLANT
SCREW CORTICAL 3.5MM  32MM (Screw) ×2 IMPLANT
SCREW CORTICAL 3.5MM 28MM (Screw) IMPLANT
SCREW CORTICAL 3.5MM 32MM (Screw) ×1 IMPLANT
SCREW CORTICAL 3.5MM 40MM (Screw) ×2 IMPLANT
SCREW LOCK CORT STAR 3.5X20 (Screw) ×1 IMPLANT
SCREW LOCK CORT STAR 3.5X22 (Screw) ×1 IMPLANT
SCREW LOCK CORT STAR 3.5X40 (Screw) ×1 IMPLANT
SCREW LOCK CORT STAR 3.5X44 (Screw) ×2 IMPLANT
SCREW LOCK CORT STAR 3.5X48 (Screw) ×1 IMPLANT
SCREW LP NL T15 3.5X20 (Screw) ×1 IMPLANT
SCREW LP NL T15 3.5X22 (Screw) ×1 IMPLANT
SCREW T15 LP CORT 3.5X44MM NS (Screw) ×1 IMPLANT
SPLINT PLASTER CAST XFAST 5X30 (CAST SUPPLIES) IMPLANT
SPLINT PLASTER XFAST SET 5X30 (CAST SUPPLIES) ×1
SPONGE T-LAP 18X18 ~~LOC~~+RFID (SPONGE) ×2 IMPLANT
STAPLER VISISTAT 35W (STAPLE) IMPLANT
SUCTION FRAZIER HANDLE 10FR (MISCELLANEOUS) ×2
SUCTION TUBE FRAZIER 10FR DISP (MISCELLANEOUS) ×1 IMPLANT
SUT ETHILON 2 0 FS 18 (SUTURE) ×4 IMPLANT
SUT PDS AB 2-0 CT1 27 (SUTURE) IMPLANT
SUT VIC AB 2-0 CT1 27 (SUTURE) ×4
SUT VIC AB 2-0 CT1 TAPERPNT 27 (SUTURE) ×2 IMPLANT
TOWEL GREEN STERILE (TOWEL DISPOSABLE) ×4 IMPLANT
TOWEL GREEN STERILE FF (TOWEL DISPOSABLE) ×2 IMPLANT
TUBE CONNECTING 12X1/4 (SUCTIONS) ×2 IMPLANT
UNDERPAD 30X36 HEAVY ABSORB (UNDERPADS AND DIAPERS) ×2 IMPLANT
WATER STERILE IRR 1000ML POUR (IV SOLUTION) ×2 IMPLANT

## 2021-05-28 NOTE — Plan of Care (Signed)

## 2021-05-28 NOTE — Anesthesia Procedure Notes (Signed)
Procedure Name: Intubation ?Date/Time: 05/28/2021 8:48 AM ?Performed by: Carolan Clines, CRNA ?Pre-anesthesia Checklist: Patient identified, Emergency Drugs available, Suction available and Patient being monitored ?Patient Re-evaluated:Patient Re-evaluated prior to induction ?Oxygen Delivery Method: Circle System Utilized ?Preoxygenation: Pre-oxygenation with 100% oxygen ?Induction Type: IV induction ?Ventilation: Mask ventilation without difficulty and Oral airway inserted - appropriate to patient size ?Laryngoscope Size: Mac and 4 ?Grade View: Grade I ?Tube type: Oral ?Tube size: 7.5 mm ?Number of attempts: 1 ?Airway Equipment and Method: Stylet and Oral airway ?Placement Confirmation: ETT inserted through vocal cords under direct vision, positive ETCO2 and breath sounds checked- equal and bilateral ?Secured at: 22 cm ?Tube secured with: Tape ?Dental Injury: Teeth and Oropharynx as per pre-operative assessment  ? ? ? ? ?

## 2021-05-28 NOTE — Anesthesia Preprocedure Evaluation (Signed)
Anesthesia Evaluation  ?Patient identified by MRN, date of birth, ID band ?Patient awake ? ? ? ?Reviewed: ?Allergy & Precautions, NPO status , Patient's Chart, lab work & pertinent test results ? ?Airway ?Mallampati: II ? ?TM Distance: >3 FB ?Neck ROM: Full ? ? ? Dental ?no notable dental hx. ? ?  ?Pulmonary ?asthma ,  ?  ?Pulmonary exam normal ?breath sounds clear to auscultation ? ? ? ? ? ? Cardiovascular ?hypertension, Pt. on medications ?Normal cardiovascular exam ?Rhythm:Regular Rate:Normal ? ? ?  ?Neuro/Psych ?negative neurological ROS ? negative psych ROS  ? GI/Hepatic ?Neg liver ROS, GERD  Medicated,  ?Endo/Other  ?diabetes ? Renal/GU ?negative Renal ROS  ?negative genitourinary ?  ?Musculoskeletal ?negative musculoskeletal ROS ?(+)  ? Abdominal ?  ?Peds ?negative pediatric ROS ?(+)  Hematology ?negative hematology ROS ?(+)   ?Anesthesia Other Findings ? ? Reproductive/Obstetrics ?negative OB ROS ? ?  ? ? ? ? ? ? ? ? ? ? ? ? ? ?  ?  ? ? ? ? ? ? ? ? ?Anesthesia Physical ?Anesthesia Plan ? ?ASA: 2 ? ?Anesthesia Plan: General  ? ?Post-op Pain Management: Dilaudid IV  ? ?Induction: Intravenous ? ?PONV Risk Score and Plan: 2 and Ondansetron, Dexamethasone, Midazolam and Treatment may vary due to age or medical condition ? ?Airway Management Planned: Oral ETT ? ?Additional Equipment:  ? ?Intra-op Plan:  ? ?Post-operative Plan: Extubation in OR ? ?Informed Consent: I have reviewed the patients History and Physical, chart, labs and discussed the procedure including the risks, benefits and alternatives for the proposed anesthesia with the patient or authorized representative who has indicated his/her understanding and acceptance.  ? ? ? ?Dental advisory given ? ?Plan Discussed with: CRNA and Surgeon ? ?Anesthesia Plan Comments:   ? ? ? ? ? ? ?Anesthesia Quick Evaluation ? ?

## 2021-05-28 NOTE — Anesthesia Postprocedure Evaluation (Signed)
Anesthesia Post Note ? ?Patient: RIGEL KOYANAGI ? ?Procedure(s) Performed: OPEN REDUCTION INTERNAL FIXATION (ORIF) TIBIA/FIBULA FRACTURE (Left: Ankle) ? ?  ? ?Patient location during evaluation: PACU ?Anesthesia Type: General ?Level of consciousness: awake and alert ?Pain management: pain level controlled ?Vital Signs Assessment: post-procedure vital signs reviewed and stable ?Respiratory status: spontaneous breathing, nonlabored ventilation, respiratory function stable and patient connected to nasal cannula oxygen ?Cardiovascular status: blood pressure returned to baseline and stable ?Postop Assessment: no apparent nausea or vomiting ?Anesthetic complications: no ? ? ?No notable events documented. ? ?Last Vitals:  ?Vitals:  ? 05/28/21 1200 05/28/21 1215  ?BP: 130/83 122/84  ?Pulse: 82 78  ?Resp: 16 13  ?Temp:  (!) 36.4 ?C  ?SpO2: 93% 97%  ?  ?Last Pain:  ?Vitals:  ? 05/28/21 1156  ?TempSrc:   ?PainSc: Asleep  ? ? ?  ?  ?  ?  ?  ?  ? ?Alhaji Mcneal S ? ? ? ? ?

## 2021-05-28 NOTE — Transfer of Care (Signed)
Immediate Anesthesia Transfer of Care Note ? ?Patient: Devin Roberts ? ?Procedure(s) Performed: OPEN REDUCTION INTERNAL FIXATION (ORIF) TIBIA/FIBULA FRACTURE (Left: Ankle) ? ?Patient Location: PACU ? ?Anesthesia Type:General ? ?Level of Consciousness: awake, alert  and oriented ? ?Airway & Oxygen Therapy: Patient Spontanous Breathing and Patient connected to face mask oxygen ? ?Post-op Assessment: Report given to RN and Post -op Vital signs reviewed and stable ? ?Post vital signs: Reviewed and stable ? ?Last Vitals:  ?Vitals Value Taken Time  ?BP 136/76 05/28/21 1111  ?Temp    ?Pulse 89 05/28/21 1113  ?Resp 16 05/28/21 1113  ?SpO2 98 % 05/28/21 1113  ?Vitals shown include unvalidated device data. ? ?Last Pain:  ?Vitals:  ? 05/28/21 0800  ?TempSrc: Oral  ?PainSc:   ?   ? ?  ? ?Complications: No notable events documented. ?

## 2021-05-28 NOTE — Progress Notes (Signed)
? ?Progress Note ? ?Day of Surgery  ?Subjective: ?CC:  ?Back from surgery. Ankle is sore but pain fairly well controlled. Otherwise right shoulder is feeling better. Denies noting any new injuries. Denies abdominal pain, nausea, vomiting. Passing flatus, no BM. ?  ? ?Objective: ?Vital signs in last 24 hours: ?Temp:  [97.5 ?F (36.4 ?C)-98.7 ?F (37.1 ?C)] 98 ?F (36.7 ?C) (03/06 1239) ?Pulse Rate:  [63-87] 80 (03/06 1239) ?Resp:  [12-20] 15 (03/06 1239) ?BP: (122-152)/(70-90) 124/76 (03/06 1239) ?SpO2:  [91 %-99 %] 95 % (03/06 1239) ?Weight:  [100.7 kg] 100.7 kg (03/06 0800) ?Last BM Date : 05/25/21 ? ?Intake/Output from previous day: ?03/05 0701 - 03/06 0700 ?In: 1560.2 [P.O.:720; I.V.:840.2] ?Out: 550 [Urine:550] ?Intake/Output this shift: ?Total I/O ?In: 1000 [I.V.:1000] ?Out: 50 [Blood:50] ? ?PE: ?General: alert, NAD ?HEENT: head is normocephalic, atraumatic.  Sclera are noninjected. Ears and nose without any masses or lesions.  Mouth is pink and moist ?Heart: regular, rate, and rhythm.  Normal s1,s2. No obvious murmurs, gallops, or rubs noted.  Palpable radial pulses bilaterally ?Lungs: CTAB, no wheezes, rhonchi, or rales noted.  Respiratory effort nonlabored ?Abd: soft, NT, ND, +BS, no masses, hernias, or organomegaly ?MS: RUE NVI; LLE in splint, L toes WWP and NVI ?Skin: warm and dry with no masses, lesions, or rashes ?Neuro: Cranial nerves 2-12 grossly intact, sensation is normal throughout ?Psych: A&Ox3 with an appropriate affect.  ?  ? ? ?Lab Results:  ?Recent Labs  ?  05/27/21 ?6237 05/28/21 ?0117  ?WBC 11.3* 10.6*  ?HGB 13.2 13.0  ?HCT 38.3* 37.1*  ?PLT 187 181  ? ?BMET ?Recent Labs  ?  05/27/21 ?6283 05/28/21 ?0117  ?NA 137 139  ?K 3.9 3.8  ?CL 104 104  ?CO2 25 29  ?GLUCOSE 145* 143*  ?BUN 18 18  ?CREATININE 0.89 1.00  ?CALCIUM 8.7* 8.9  ? ?PT/INR ?No results for input(s): LABPROT, INR in the last 72 hours. ?CMP  ?   ?Component Value Date/Time  ? NA 139 05/28/2021 0117  ? NA 129 (A) 03/29/2015 0000  ? K  3.8 05/28/2021 0117  ? CL 104 05/28/2021 0117  ? CO2 29 05/28/2021 0117  ? GLUCOSE 143 (H) 05/28/2021 0117  ? BUN 18 05/28/2021 0117  ? BUN 26 (A) 03/29/2015 0000  ? CREATININE 1.00 05/28/2021 0117  ? CALCIUM 8.9 05/28/2021 0117  ? PROT 6.1 (L) 05/28/2021 0117  ? ALBUMIN 3.1 (L) 05/28/2021 0117  ? AST 30 05/28/2021 0117  ? ALT 23 05/28/2021 0117  ? ALKPHOS 37 (L) 05/28/2021 0117  ? BILITOT 0.8 05/28/2021 0117  ? GFRNONAA >60 05/28/2021 0117  ? GFRAA 53 (L) 01/25/2017 1834  ? ?Lipase  ?No results found for: LIPASE ? ? ? ? ?Studies/Results: ?DG Tibia/Fibula Left ? ?Result Date: 05/28/2021 ?CLINICAL DATA:  Fluoroscopic assistance for reduction and internal fixation of fracture of distal tibia EXAM: LEFT TIBIA AND FIBULA - 2 VIEW COMPARISON:  05/25/2021 FINDINGS: Fluoroscopic images show reduction and internal fixation of comminuted fracture of distal radius. There are 2 surgical screws traversing distal fibula and tibia suggesting internal fixation of inferior tibiofibular joint. Fluoroscopic time was 58 seconds. Radiation dose is 2.25 mGy. IMPRESSION: Fluoroscopic assistance was provided for reduction and internal fixation of fracture of distal tibia. Electronically Signed   By: Ernie Avena M.D.   On: 05/28/2021 10:55  ? ?DG Ankle Complete Left ? ?Result Date: 05/28/2021 ?CLINICAL DATA:  Postoperative exam.  Left ankle fracture. EXAM: LEFT ANKLE COMPLETE - 3+ VIEW  COMPARISON:  CT left ankle 05/25/2021, left ankle radiographs 05/25/2021 FINDINGS: There is overlying casting material. New medial plate and screw fixation of the distal tibia. Additional anterior approach distal tibial metadiaphyseal screw. New distal fibular lateral plate and screw fixation hardware that also secures the distal tibiofibular syndesmosis. Marked improvement in prior displaced distal tibial metadiaphyseal fracture. Minimally displaced posterior malleolar fracture is again noted. The ankle mortise is symmetric and intact. Minimal distal  medial malleolar degenerative osteophytes. There is partial visualization of a likely fracture of the mid left fibular diaphysis with mild lateral apex angulation. IMPRESSION:: IMPRESSION: 1. Interval ORIF of the distal tibia and distal tibiofibular syndesmosis with improved now anatomic alignment. 2. Partial visualization of a likely acute fracture of the mid left fibular diaphysis with mild lateral apex angulation. Electronically Signed   By: Neita Garnet M.D.   On: 05/28/2021 12:27  ? ?DG C-Arm 1-60 Min-No Report ? ?Result Date: 05/28/2021 ?Fluoroscopy was utilized by the requesting physician.  No radiographic interpretation.  ? ?DG C-Arm 1-60 Min-No Report ? ?Result Date: 05/28/2021 ?Fluoroscopy was utilized by the requesting physician.  No radiographic interpretation.   ? ?Anti-infectives: ?Anti-infectives (From admission, onward)  ? ? Start     Dose/Rate Route Frequency Ordered Stop  ? 05/28/21 1700  ceFAZolin (ANCEF) IVPB 2g/100 mL premix       ? 2 g ?200 mL/hr over 30 Minutes Intravenous Every 8 hours 05/28/21 1233 05/29/21 1659  ? 05/28/21 0700  ceFAZolin (ANCEF) IVPB 2g/100 mL premix       ? 2 g ?200 mL/hr over 30 Minutes Intravenous On call to O.R. 05/27/21 1230 05/28/21 0920  ? ?  ? ? ? ?Assessment/Plan ?Workplace fall/injury ?R scapula fx - per ortho, sling for comfort, ok to WB thru RUE ?L2 vertebral body fx/L1 inf endplate fx  - per NS, TLSO  ?L tib-fib and L malleolus fx - s/p ORIF 3/6 Dr. Carola Frost. NWB LLE ?AKI - Cr improved ?Hyperglycemia - SSI, A1c 6.5 ?HTN - home meds ?HLD - home meds ?  ?FEN: CM diet, KVO IVF. Add scheduled miralax ?VTE: SCDs, LMWH ?ID: ancef periop ?  ?Dispo: Continue PT/OT - recommending CIR which patient and wife are interested in. ? ? LOS: 3 days  ? ?I reviewed Consultant ortho notes, last 24 h vitals and pain scores, last 48 h intake and output, last 24 h labs and trends, and last 24 h imaging results. ? ?This care required low level of medical decision making.  ? ? ?Franne Forts, PA-C ?[facility] ?05/28/2021, 12:57 PM ?Please see Amion for pager number during day hours 7:00am-4:30pm ? ?

## 2021-05-28 NOTE — Op Note (Signed)
05/25/2021 - 05/28/2021 ? ?10:47 AM ? ?PATIENT:  Devin Roberts  03-20-1961 male  ? ?MEDICAL RECORD NUMBER: [id number] ? ?PRE-OPERATIVE DIAGNOSIS:   ?LEFT ANKLE PILON FRACTURE, TIBIA AND FIBULA ?SUSPECTED SYNDESMOSIS TEAR, MAISONNEUVE'S FRACTURE ? ?POST-OPERATIVE DIAGNOSIS:   ?LEFT ANKLE PILON FRACTURE, TIBIA AND FIBULA ?SYNDESMOSIS TEAR, MAISONNEUVE'S FRACTURE ? ?PROCEDURE:   ?OPEN REDUCTION INTERNAL FIXATION OF LEFT ANKLE PILON FRACTURE, TIBIA ONLY ?OPEN REDUCTION INTERNAL FIXATION OF THE SYNDESMOSIS ?MANUAL APPLICATION OF STRESS TO ANKLE SYNDESMOSIS UNDER FLUOROSCOPY ? ?SURGEON:  Doralee Albino. Carola Frost, M.D. ? ?ASSISTANT:  1. Montez Morita, PA-C.; 2. PA Student ? ?ANESTHESIA:  General. ? ?COMPLICATIONS:  None. ? ?TOURNIQUET: None. ? ?ESTIMATED BLOOD LOSS:  min mL. ? ?DISPOSITION:  To PACU. ? ?CONDITION:  Stable. ? ?DELAY START OF DVT PROPHYLAXIS BECAUSE OF BLEEDING RISK: NO ? ? ?BRIEF SUMMARY AND INDICATION FOR PROCEDURE: Patient is a 61 y.o. who sustained a displaced and shortened pilon fracture in work related accident with associtated lumbar and scapula fractures.  The patient was initially seen and ?evaluated by Dr. Blanchie Dessert, who recognized the complexity of the ?injury and asserted this was outside his scope of practice and that this injury would be ?best managed by a fellowship trained orthopedic traumatologist. ?Consequently, we were consulted to evaluate the patient and provide appropriate treatment.  I discussed with the patient the risks and benefits of surgery, including the possibility of infection, nerve injury, vessel injury, wound breakdown, arthritis, symptomatic hardware, DVT/ PE, loss of motion, malunion, nonunion, and need for further surgery among others.  We also specifically discussed the potential need to stage surgery because of the elevated risk of soft tissue breakdown that could lead to amputation.  Furthermore, we addressed the syndesmosis ligament, which would be assessed following fracture  repair. If unstable, this would require additional fixation and that some of this fixation may require later removal. These risks were acknowledged and consent provided to proceed. ?  ?SUMMARY OF PROCEDURE:  The patient was taken to the operating room after ?administration of preoperative antibiotics.  General anesthesia was ?induced.  The left lower extremity was cleaned with chlorhexidine soap ?and scrub and then followed by Betadine scrub and paint.  Because we still had wrinkling in the anticipated area of incision, we felt that it was most prudent to proceed with direct fixation using a limited dissection.  A medial incision was made and branches to the saphenous controlled with suture and cautery, preserving neurovascular bundles at metaphyseal level where it crossed from posterior to anterior over the plate. The fracture was identified and periosteum extracted from within the fracture site which was then ?cleaned with curette and lavaged.  A reduction maneuver with the tenaculum was then performed.  This was compressed and an anterior-to-posterior lag screw placed to secure it. ?The clamp was then removed and anteromedial plate applied and pinned provisionally using Biomet Alps plate. Standard conical screws were placed proximally and distally, then two locked screws proximally followed by locking screws distally. Proximal scres were placed percutaneously. C-arm was brought in to confirm.  My assistant, ?Montez Morita, PAC, retracted the nerve and vessel to protect it throughout ?and also helped to place provisional fixation and produced compression ?while it was tightening definitive fixation.  Once the C-arm was in ?place, AP, lateral, and mortise views were obtained, and then manual stress ?Applied under fluoro which showed lateral subluxation ?of the talus and syndesmotic widening with increase in the medial clear ?space. A sperate lateral incision was made with a two  hole plate as a washer and two  anteromedially directed tricortical screws. Repeat stress test under fluoro showed no remaining instability. The wounds were irrigated thoroughly and closed in standard ?layered fashion using 2-0 Vicryl and 3-0 nylon.  Sterile gently ?compressive dressing was applied and then, a posterior and stirrup ?splint.  The patient was awakened from anesthesia and transported to ?PACU in stable condition.  Again, Montez Morita, PAC, did assist me ?throughout. ?  ?PROGNOSIS: Patient is at increased risk for infection, nonunion, ?delayed union, and soft tissue complications because of magnitude of injury and diabetes but early repair and excellent fixation should help to mitigate. PT will assist with nonweightbearing for the next 8 weeks with protected graduated ?weightbearing thereafter and patient will be on formal pharmacologic DVT ?prophylaxis. Sutures will be removed at two weeks and early ROM encouraged. ?  ?Myrene Galas, MD ?[facility] ?[phone] ?  ?

## 2021-05-28 NOTE — TOC Initial Note (Addendum)
Transition of Care (TOC) - Initial/Assessment Note  ? ? ?Patient Details  ?Name: Devin Roberts ?MRN: [id number] ?Date of Birth: [birth year] ? ?Transition of Care [facility]) CM/SW Contact:    ?Glennon Mac, RN ?Phone Number: ?05/28/2021, 1143 ? ?Clinical Narrative:                 ?Spoke with pt's wife, Luster Landsberg, per her request: she has concerns about WC, as she has received a phone call from the adjustor. Appears claim has been filed on pt's behalf by his employer.  Wife able to share adjustor/rehab contact information.  She gives permission for patient information to be shared with [facility] agency.   ? ?WC Information as follows: ?[employer] ?[address], [address] ?[place], [place] [address] ? ?Claim # [id number] ?Adjustor, Donneta Romberg ?Phone: [phone] ?Fax: [phone] ? ?Rehab Nurse, Neita Carp ?Phone: [phone] ?Fax: [phone] ? ?Faxed clinical information to rehab nurse, per request.  Admissions notified to update account with WC information.  Will continue to follow/coordinate disposition with WC agency.  ? ?Expected Discharge Plan: IP Rehab Facility ?Barriers to Discharge: Continued Medical Work up ? ? ?Patient Goals and CMS Choice ?Patient states their goals for this hospitalization and ongoing recovery are:: to get better ?  ?  ? ?Expected Discharge Plan and Services ?Expected Discharge Plan: IP Rehab Facility ?  ?Discharge Planning Services: CM Consult ?  ?Living arrangements for the past 2 months: Single Family Home ?                ?  ?  ?  ?  ?  ?  ?  ?  ?  ?  ? ?Prior Living Arrangements/Services ?Living arrangements for the past 2 months: Single Family Home ?Lives with:: Spouse ?Patient language and need for interpreter reviewed:: Yes ?Do you feel safe going back to the place where you live?: Yes      ?Need for Family Participation in Patient Care: Yes (Comment) ?Care giver support system in place?: Yes (comment) ?  ?Criminal Activity/Legal Involvement Pertinent to Current  Situation/Hospitalization: No - Comment as needed ? ?Activities of Daily Living ?Home Assistive Devices/Equipment: None ?ADL Screening (condition at time of admission) ?Patient's cognitive ability adequate to safely complete daily activities?: No ?Is the patient deaf or have difficulty hearing?: No ?Does the patient have difficulty seeing, even when wearing glasses/contacts?: No ?Does the patient have difficulty concentrating, remembering, or making decisions?: No ?Patient able to express need for assistance with ADLs?: Yes ?Does the patient have difficulty dressing or bathing?: Yes ?Independently performs ADLs?: No ?Communication: Independent ?Dressing (OT): Needs assistance ?Is this a change from baseline?: Change from baseline, expected to last >3 days ?Grooming: Needs assistance ?Is this a change from baseline?: Change from baseline, expected to last >3 days ?Feeding: Appropriate for developmental age ?Bathing: Needs assistance ?Is this a change from baseline?: Change from baseline, expected to last >3 days ?In/Out Bed: Needs assistance ?Is this a change from baseline?: Change from baseline, expected to last >3 days ?Walks in Home: Independent ?Does the patient have difficulty walking or climbing stairs?: Yes ?Weakness of Legs: Left ?Weakness of Arms/Hands: Right ? ?  ?  ?   ?   ?   ?   ? ?Emotional Assessment ?  ?Attitude/Demeanor/Rapport: Engaged ?Affect (typically observed): Accepting ?Orientation: : Oriented to Self, Oriented to Place, Oriented to  Time, Oriented to Situation ?  ?  ? ?Admission diagnosis:  Multiple fractures [T07.XXXA] ?Trauma [T14.90XA] ?Ankle fracture [  S82.899A] ?Ankle fracture, bimalleolar, closed, left, initial encounter [S82.842A] ?Closed fracture of proximal end of left fibula, unspecified fracture morphology, initial encounter [S82.832A] ?Closed fracture of right scapula, unspecified part of scapula, initial encounter [S42.101A] ?Compression fracture of L1 vertebra, initial encounter  (HCC) [S32.010A] ?Compression fracture of L2 vertebra, initial encounter (HCC) [S32.020A] ?Patient Active Problem List  ? Diagnosis Date Noted  ? Multiple fractures 05/25/2021  ? Lesion of skin of right ear 01/18/2020  ? Chronic idiopathic constipation 12/07/2018  ? Pure hypertriglyceridemia 11/17/2018  ? Depression with anxiety 01/15/2018  ? Vitamin D deficiency 11/13/2017  ? Low back pain at multiple sites 04/03/2017  ? Snoring 01/06/2014  ? Type II diabetes mellitus with complication, uncontrolled 03/10/2012  ? Insomnia 03/10/2012  ? Annual physical exam 12/07/2010  ? Hyperlipidemia with target LDL less than 100 01/31/2007  ? Obesity 01/31/2007  ? Essential hypertension 01/31/2007  ? Allergic rhinitis 01/31/2007  ? GERD 01/31/2007  ? ?PCP:  Etta Grandchild, MD ?Pharmacy:   ?[facility] [place], [place] - [address] ?[address] ?[place] [address] ?Phone: [phone] Fax: [phone] ? ? ? ? ?Social Determinants of Health (SDOH) Interventions ?  ? ?Readmission Risk Interventions ?No flowsheet data found. ? ?Quintella Baton, RN, BSN  ?Trauma/Neuro ICU Case Manager ?[phone] ? ?

## 2021-05-29 ENCOUNTER — Inpatient Hospital Stay (HOSPITAL_COMMUNITY): Payer: Worker's Compensation

## 2021-05-29 ENCOUNTER — Encounter (HOSPITAL_COMMUNITY): Payer: Self-pay | Admitting: Orthopedic Surgery

## 2021-05-29 LAB — GLUCOSE, CAPILLARY
Glucose-Capillary: 130 mg/dL — ABNORMAL HIGH (ref 70–99)
Glucose-Capillary: 149 mg/dL — ABNORMAL HIGH (ref 70–99)
Glucose-Capillary: 190 mg/dL — ABNORMAL HIGH (ref 70–99)
Glucose-Capillary: 124 mg/dL — ABNORMAL HIGH (ref 70–99)

## 2021-05-29 LAB — CBC
HCT: 34.5 % — ABNORMAL LOW (ref 39.0–52.0)
Hemoglobin: 12.1 g/dL — ABNORMAL LOW (ref 13.0–17.0)
MCH: 32.5 pg (ref 26.0–34.0)
MCHC: 35.1 g/dL (ref 30.0–36.0)
MCV: 92.7 fL (ref 80.0–100.0)
Platelets: 189 10*3/uL (ref 150–400)
RBC: 3.72 MIL/uL — ABNORMAL LOW (ref 4.22–5.81)
RDW: 12.1 % (ref 11.5–15.5)
WBC: 14 10*3/uL — ABNORMAL HIGH (ref 4.0–10.5)
nRBC: 0 % (ref 0.0–0.2)

## 2021-05-29 MED ORDER — ACETAMINOPHEN 500 MG PO TABS
1000.0000 mg | ORAL_TABLET | Freq: Four times a day (QID) | ORAL | Status: DC
Start: 2021-05-29 — End: 2021-05-30
  Administered 2021-05-29 – 2021-05-30 (×4): 1000 mg via ORAL
  Filled 2021-05-29 (×4): qty 2

## 2021-05-29 MED ORDER — ASCORBIC ACID 500 MG PO TABS
1000.0000 mg | ORAL_TABLET | Freq: Every day | ORAL | Status: DC
  Administered 2021-05-29 – 2021-05-30 (×2): 1000 mg via ORAL
  Filled 2021-05-29 (×2): qty 2

## 2021-05-29 MED ORDER — VITAMIN D 25 MCG (1000 UNIT) PO TABS
2000.0000 [IU] | ORAL_TABLET | Freq: Two times a day (BID) | ORAL | Status: DC
  Administered 2021-05-29 – 2021-05-30 (×3): 2000 [IU] via ORAL
  Filled 2021-05-29 (×3): qty 2

## 2021-05-29 MED ORDER — KETOROLAC TROMETHAMINE 15 MG/ML IJ SOLN
15.0000 mg | Freq: Four times a day (QID) | INTRAMUSCULAR | Status: AC
  Administered 2021-05-29 – 2021-05-30 (×4): 15 mg via INTRAVENOUS
  Filled 2021-05-29 (×4): qty 1

## 2021-05-29 MED ORDER — METHOCARBAMOL 750 MG PO TABS
750.0000 mg | ORAL_TABLET | Freq: Four times a day (QID) | ORAL | Status: DC
  Administered 2021-05-29 – 2021-05-30 (×4): 750 mg via ORAL
  Filled 2021-05-29 (×4): qty 1

## 2021-05-29 MED ORDER — ENOXAPARIN SODIUM 40 MG/0.4ML IJ SOSY
40.0000 mg | PREFILLED_SYRINGE | Freq: Two times a day (BID) | INTRAMUSCULAR | Status: DC
  Administered 2021-05-29 – 2021-05-30 (×2): 40 mg via SUBCUTANEOUS
  Filled 2021-05-29 (×2): qty 0.4

## 2021-05-29 MED ORDER — BISACODYL 10 MG RE SUPP: 10.0000 mg | Freq: Every day | RECTAL | Status: DC | PRN

## 2021-05-29 NOTE — Progress Notes (Signed)
Seems to be doing well from lumbar spine standpoint. Says it is a "little stiff" when he has been up in brace, but no "pain." No leg sxs. No NTW ? ?Will check xrays ?

## 2021-05-29 NOTE — Plan of Care (Signed)
Problem: Education: Goal: Knowledge of General Education information will improve Description Including pain rating scale, medication(s)/side effects and non-pharmacologic comfort measures Outcome: Progressing   Problem: Health Behavior/Discharge Planning: Goal: Ability to manage health-related needs will improve Outcome: Progressing   Problem: Clinical Measurements: Goal: Respiratory complications will improve Outcome: Progressing   Problem: Activity: Goal: Risk for activity intolerance will decrease Outcome: Progressing   Problem: Nutrition: Goal: Adequate nutrition will be maintained Outcome: Progressing   Problem: Coping: Goal: Level of anxiety will decrease Outcome: Progressing   Problem: Elimination: Goal: Will not experience complications related to bowel motility Outcome: Progressing Goal: Will not experience complications related to urinary retention Outcome: Progressing   Problem: Pain Managment: Goal: General experience of comfort will improve Outcome: Progressing   Problem: Safety: Goal: Ability to remain free from injury will improve Outcome: Progressing   Problem: Skin Integrity: Goal: Risk for impaired skin integrity will decrease Outcome: Progressing   

## 2021-05-29 NOTE — Progress Notes (Signed)
? ?Progress Note ? ?1 Day Post-Op  ?Subjective: ?Having pain in left ankle. Overall well controlled with oral pain medication. No BM for several days. Tolerating diet without nausea or emesis. No respiratory complaints ? ?Objective: ?Vital signs in last 24 hours: ?Temp:  [97.5 ?F (36.4 ?C)-98.8 ?F (37.1 ?C)] 98.1 ?F (36.7 ?C) (03/07 0636) ?Pulse Rate:  [68-87] 68 (03/07 0636) ?Resp:  [12-20] 18 (03/07 0636) ?BP: (122-146)/(70-86) 129/84 (03/07 0636) ?SpO2:  [91 %-99 %] 97 % (03/07 0636) ?FiO2 (%):  [28 %] 28 % (03/06 1109) ?Weight:  [100.7 kg] 100.7 kg (03/06 0800) ?Last BM Date : 05/25/21 ? ?Intake/Output from previous day: ?03/06 0701 - 03/07 0700 ?In: 1974.7 [P.O.:480; I.V.:1394.7; IV Piggyback:100] ?Out: [id number] [Urine:1520; Blood:50] ?Intake/Output this shift: ?No intake/output data recorded. ? ?PE: ?General: alert, NAD ?HEENT: head is normocephalic, atraumatic. Mouth is pink and moist ?Heart: Palpable radial pulses bilaterally ?Lungs:  Respiratory effort nonlabored on room air ?Abd: soft, NT, ND, +BS, no masses, hernias, or organomegaly ?MS: RUE NVI; LLE in splint, L toes WWP and NVI ?Skin: warm and dry ?Psych: A&Ox3 with an appropriate affect.  ?  ? ? ?Lab Results:  ?Recent Labs  ?  05/28/21 ?0117 05/29/21 ?0130  ?WBC 10.6* 14.0*  ?HGB 13.0 12.1*  ?HCT 37.1* 34.5*  ?PLT 181 189  ? ? ?BMET ?Recent Labs  ?  05/27/21 ?[id number] 05/28/21 ?0117  ?NA 137 139  ?K 3.9 3.8  ?CL 104 104  ?CO2 25 29  ?GLUCOSE 145* 143*  ?BUN 18 18  ?CREATININE 0.89 1.00  ?CALCIUM 8.7* 8.9  ? ? ?PT/INR ?No results for input(s): LABPROT, INR in the last 72 hours. ?CMP  ?   ?Component Value Date/Time  ? NA 139 05/28/2021 0117  ? NA 129 (A) 03/29/2015 0000  ? K 3.8 05/28/2021 0117  ? CL 104 05/28/2021 0117  ? CO2 29 05/28/2021 0117  ? GLUCOSE 143 (H) 05/28/2021 0117  ? BUN 18 05/28/2021 0117  ? BUN 26 (A) 03/29/2015 0000  ? CREATININE 1.00 05/28/2021 0117  ? CALCIUM 8.9 05/28/2021 0117  ? PROT 6.1 (L) 05/28/2021 0117  ? ALBUMIN 3.1 (L) 05/28/2021  0117  ? AST 30 05/28/2021 0117  ? ALT 23 05/28/2021 0117  ? ALKPHOS 37 (L) 05/28/2021 0117  ? BILITOT 0.8 05/28/2021 0117  ? GFRNONAA >60 05/28/2021 0117  ? GFRAA 53 (L) 01/25/2017 1834  ? ?Lipase  ?No results found for: LIPASE ? ? ? ? ?Studies/Results: ?DG Tibia/Fibula Left ? ?Result Date: 05/28/2021 ?CLINICAL DATA:  Fluoroscopic assistance for reduction and internal fixation of fracture of distal tibia EXAM: LEFT TIBIA AND FIBULA - 2 VIEW COMPARISON:  05/25/2021 FINDINGS: Fluoroscopic images show reduction and internal fixation of comminuted fracture of distal radius. There are 2 surgical screws traversing distal fibula and tibia suggesting internal fixation of inferior tibiofibular joint. Fluoroscopic time was 58 seconds. Radiation dose is 2.25 mGy. IMPRESSION: Fluoroscopic assistance was provided for reduction and internal fixation of fracture of distal tibia. Electronically Signed   By: Elmer Picker M.D.   On: 05/28/2021 10:55  ? ?DG Ankle Complete Left ? ?Result Date: 05/28/2021 ?CLINICAL DATA:  Postoperative exam.  Left ankle fracture. EXAM: LEFT ANKLE COMPLETE - 3+ VIEW COMPARISON:  CT left ankle 05/25/2021, left ankle radiographs 05/25/2021 FINDINGS: There is overlying casting material. New medial plate and screw fixation of the distal tibia. Additional anterior approach distal tibial metadiaphyseal screw. New distal fibular lateral plate and screw fixation hardware that also secures the distal  tibiofibular syndesmosis. Marked improvement in prior displaced distal tibial metadiaphyseal fracture. Minimally displaced posterior malleolar fracture is again noted. The ankle mortise is symmetric and intact. Minimal distal medial malleolar degenerative osteophytes. There is partial visualization of a likely fracture of the mid left fibular diaphysis with mild lateral apex angulation. IMPRESSION:: IMPRESSION: 1. Interval ORIF of the distal tibia and distal tibiofibular syndesmosis with improved now anatomic  alignment. 2. Partial visualization of a likely acute fracture of the mid left fibular diaphysis with mild lateral apex angulation. Electronically Signed   By: Yvonne Kendall M.D.   On: 05/28/2021 12:27  ? ?DG C-Arm 1-60 Min-No Report ? ?Result Date: 05/28/2021 ?Fluoroscopy was utilized by the requesting physician.  No radiographic interpretation.  ? ?DG C-Arm 1-60 Min-No Report ? ?Result Date: 05/28/2021 ?Fluoroscopy was utilized by the requesting physician.  No radiographic interpretation.   ? ?Anti-infectives: ?Anti-infectives (From admission, onward)  ? ? Start     Dose/Rate Route Frequency Ordered Stop  ? 05/28/21 1700  ceFAZolin (ANCEF) IVPB 2g/100 mL premix       ? 2 g ?200 mL/hr over 30 Minutes Intravenous Every 8 hours 05/28/21 1233 05/29/21 1659  ? 05/28/21 0700  ceFAZolin (ANCEF) IVPB 2g/100 mL premix       ? 2 g ?200 mL/hr over 30 Minutes Intravenous On call to O.R. 05/27/21 1230 05/28/21 0920  ? ?  ? ? ? ?Assessment/Plan ?Workplace fall/injury ?R scapula fx - per ortho, sling for comfort, ok to WB thru RUE ?L2 vertebral body fx/L1 inf endplate fx  - per NS, TLSO  ?L tib-fib and L malleolus fx - s/p ORIF 3/6 Dr. Marcelino Scot. NWB LLE x 8wks ?AKI - Cr improved ?Hyperglycemia - SSI, A1c 6.5 ?HTN - home meds ?HLD - home meds ?  ?FEN: CM diet, KVO IVF. Cont scheduled miralax, prn suppository ?VTE: SCDs, LMWH ?ID: ancef periop ?  ?Dispo: Continue PT/OT - recommending CIR which patient and wife are interested in. Recheck labs am ? ? LOS: 4 days  ? ?I reviewed Consultant ortho notes, last 24 h vitals and pain scores, last 48 h intake and output, last 24 h labs and trends, and last 24 h imaging results. ? ?This care required low level of medical decision making.  ? ? ?Winferd Humphrey, PA-C ?[facility] ?05/29/2021, 7:36 AM ?Please see Amion for pager number during day hours 7:00am-4:30pm ? ?

## 2021-05-29 NOTE — Progress Notes (Signed)
? ?                              Orthopaedic Trauma Service Progress Note ? ?Patient ID: ?Devin Roberts ?MRN: [id number] ?DOB/AGE: [birth year] 61 y.o. ? ?Subjective: ? ?Doing great ?Pain in L ankle minimal  ?No other complaints  ? ? ?ROS ?As above ? ?Objective:  ? ?VITALS:   ?Vitals:  ? 05/28/21 2134 05/29/21 6734 05/29/21 0636 05/29/21 0747  ?BP: 139/83 132/86 129/84 (!) 160/70  ?Pulse: 72 72 68 68  ?Resp: 17  18 18   ?Temp: 98.8 ?F (37.1 ?C) 98.3 ?F (36.8 ?C) 98.1 ?F (36.7 ?C) 98.3 ?F (36.8 ?C)  ?TempSrc: Oral Oral Oral Oral  ?SpO2: 96% 97% 97% 98%  ?Weight:      ?Height:      ? ? ?Estimated body mass index is 33.75 kg/m? as calculated from the following: ?  Height as of this encounter: 5\' 8"  (1.727 m). ?  Weight as of this encounter: 100.7 kg. ? ? ?Intake/Output   ?   03/06 0701 ?03/07 0700 03/07 0701 ?03/08 0700  ? P.O. 480   ? I.V. (mL/kg) 1394.7 (13.8)   ? IV Piggyback 100   ? Total Intake(mL/kg) 1974.7 (19.6)   ? Urine (mL/kg/hr) 1520 (0.6)   ? Blood 50   ? Total Output 1570   ? Net +404.7   ?     ?  ? ?LABS ? ?Results for orders placed or performed during the hospital encounter of 05/25/21 (from the past 24 hour(s))  ?Glucose, capillary     Status: Abnormal  ? Collection Time: 05/28/21 11:12 AM  ?Result Value Ref Range  ? Glucose-Capillary 148 (H) 70 - 99 mg/dL  ?Glucose, capillary     Status: Abnormal  ? Collection Time: 05/28/21  5:30 PM  ?Result Value Ref Range  ? Glucose-Capillary 179 (H) 70 - 99 mg/dL  ?Glucose, capillary     Status: Abnormal  ? Collection Time: 05/28/21  9:31 PM  ?Result Value Ref Range  ? Glucose-Capillary 154 (H) 70 - 99 mg/dL  ?CBC     Status: Abnormal  ? Collection Time: 05/29/21  1:30 AM  ?Result Value Ref Range  ? WBC 14.0 (H) 4.0 - 10.5 K/uL  ? RBC 3.72 (L) 4.22 - 5.81 MIL/uL  ? Hemoglobin 12.1 (L) 13.0 - 17.0 g/dL  ? HCT 34.5 (L) 39.0 - 52.0 %  ? MCV 92.7 80.0 - 100.0 fL  ? MCH 32.5 26.0 - 34.0 pg  ? MCHC 35.1 30.0 - 36.0 g/dL  ? RDW 12.1  11.5 - 15.5 %  ? Platelets 189 150 - 400 K/uL  ? nRBC 0.0 0.0 - 0.2 %  ?Glucose, capillary     Status: Abnormal  ? Collection Time: 05/29/21  7:49 AM  ?Result Value Ref Range  ? Glucose-Capillary 130 (H) 70 - 99 mg/dL  ? ? ? ?PHYSICAL EXAM:  ? ?Gen: resting comfortably in bed, NAD, appears well, pleasant  ?Lungs: unlabored ?Cardiac: reg ?Abd: + BS ?Ext:  ?     Left Lower Extremity  ? Dressing c/d/I ? Ext warm  ? + DP pulse ? DPN, SPN, TN sensation intact ? EHL, FHL, lesser to motor intact ? No pain out of proportion with stretching toes  ? Good perfusion distally  ?  ?     Right Upper Extremity  ? Moving shoulder very well actively  ? No acute changes ?  Motor and sensory functions intact ? Minimal swelling  ? + radial pulse  ? ?Assessment/Plan: ?1 Day Post-Op  ? ? ?Anti-infectives (From admission, onward)  ? ? Start     Dose/Rate Route Frequency Ordered Stop  ? 05/28/21 1700  ceFAZolin (ANCEF) IVPB 2g/100 mL premix       ? 2 g ?200 mL/hr over 30 Minutes Intravenous Every 8 hours 05/28/21 1233 05/29/21 1659  ? 05/28/21 0700  ceFAZolin (ANCEF) IVPB 2g/100 mL premix       ? 2 g ?200 mL/hr over 30 Minutes Intravenous On call to O.R. 05/27/21 1230 05/28/21 0920  ? ?  ?. ? ?POD/HD#: 91 ? ?61 year old male work-related accident with multiple fractures ?  ?-Work-related accident ?  ?-Multiple orthopedic injuries ?              Comminuted right scapular body fracture ?              Left distal tibia fracture including extra-articular component, posterior malleolus and left proximal fibula fracture ?  ?              Right scapula fracture---> non-op ?                            Sling for comfort ?                            Early range of motion ?                            Can use right upper extremity to help mobilize ?                            Ok to [employer] thru R UEx  ?  ?  ?              Left distal tibia fracture, posterior malleolus fracture, left proximal tibia fracture s/p ORIF tibia and syndesmosis  ?                             NWB L leg x 8 weeks ?       Splint x 2 weeks then CAM boot  ?       Ice and elevate  ?       PT/OT evals ? ?      Unrestricted Knee and hip ROM  ?  ?-Lumbar fractures ?              Per neurosurgery ?  ?- Pain management: ?              Continue with current regimen ? ?- ABL anemia/Hemodynamics ?              Stable ?  ?- Medical issues  ?              Hypertension ?                            Blood pressures look good ?  ?              Diabetes  ?  Will have DM coordinators see for education  ?  ?- DVT/PE prophylaxis: ?              SCD R leg  ?              Lovenox  ?  Would do lovenox x 21 days  ?  ?- ID:  ?              Periop abx  ?  ?- Metabolic Bone Disease: ?              vitamin d low normal  ?  Supplement  ? ?- Activity: ?              NWB L leg ?               ?- FEN/GI prophylaxis/Foley/Lines: ?              carb mod diet ?  ?- Impediments to fracture healing: ?              DM ?  ?- Dispo: ?             therapy evals ?  CIR consult  ?             Ortho issues addressed  ?  ?Mearl Latin, PA-C ?[phone] (C) ?05/29/2021, 9:16 AM ? ?[facility] ?[address] ?[place] [address] ?[phone] [place]) ?[phone] (F) ? ? ? ?After 5pm and on the weekends please log on to Amion, go to orthopaedics and the look under the [facility] Call for the provider(s) on call. You can also call our office at [phone] and then follow the prompts to be connected to the call team.  ? Patient ID: Devin Roberts, male   DOB: [birth year], 61 y.o.   MRN: [id number] ? ?

## 2021-05-29 NOTE — Progress Notes (Addendum)
Physical Therapy Treatment ?Patient Details ?Name: Devin Roberts ?MRN: [id number] ?DOB: [birth year] ?Today's Date: 05/29/2021 ? ? ?History of Present Illness 61 yo admitted 3/3 after workplace accident where crane fell on his shoulder and he fell between 2 pallets. Pt with L1-2 fx, complex LLE fx await sx 3/6, Rt scapula fx. PMhx: HTN, HLD, DM, anxiety, obesity ? ?  ?PT Comments  ? ? Pt was seen for mobility on side of bed and discussed options to get to chair.  Pt likes to do a pivot transfer, and was good with one person and slow progression with LUE support only.  Talked with nursing about the return to bed, and will use a second person if nursing is doing the transition back to bed.  Follow up with him for work on standing balance, to see how drop arm chair might increase his independence and to work on alternate ideas to help with the mult injuries and limitations.  CIR recommended due to his level of tolerance to move, his available help for home and his plan to return to work. Pt received split session to get back to bed with education of nursing to assist him safely.  ?Recommendations for follow up therapy are one component of a multi-disciplinary discharge planning process, led by the attending physician.  Recommendations may be updated based on patient status, additional functional criteria and insurance authorization. ? ?Follow Up Recommendations ? Acute inpatient rehab (3hours/day) ?  ?  ?Assistance Recommended at Discharge Set up Supervision/Assistance  ?Patient can return home with the following A lot of help with walking and/or transfers;A lot of help with bathing/dressing/bathroom;Assistance with cooking/housework;Assist for transportation;Help with stairs or ramp for entrance ?  ?Equipment Recommendations ? Wheelchair (measurements PT);BSC/3in1;Wheelchair cushion (measurements PT)  ?  ?Recommendations for Other Services   ? ? ?  ?Precautions / Restrictions Precautions ?Precautions:  Back;Fall ?Precaution Comments: sling RUE ?Required Braces or Orthoses: Spinal Brace;Sling ?Spinal Brace: Thoracolumbosacral orthotic;Applied in supine position ?Restrictions ?Weight Bearing Restrictions: Yes ?RUE Weight Bearing: Weight bearing as tolerated ?LLE Weight Bearing: Non weight bearing ?Other Position/Activity Restrictions: pt was instructed to minimize RUE wgt  ?  ? ?Mobility ? Bed Mobility ?Overal bed mobility: Needs Assistance ?Bed Mobility: Rolling, Sidelying to Sit ?Rolling: Min guard ?Sidelying to sit: Min assist ?  ?  ?  ?General bed mobility comments: min assist to manage back precautions ?  ? ?Transfers ?Overall transfer level: Needs assistance ?Equipment used: 1 person hand held assist ?Transfers: Sit to/from Stand, Bed to chair/wheelchair/BSC ?Sit to Stand: Min assist ?Stand pivot transfers: Min assist ?  ?  ?  ?  ?  ?  ? ?Ambulation/Gait ?  ?  ?  ?  ?  ?  ?  ?General Gait Details: unable ? ? ?Stairs ?  ?  ?  ?  ?  ? ? ?Wheelchair Mobility ?  ? ?Modified Rankin (Stroke Patients Only) ?  ? ? ?  ?Balance Overall balance assessment: Needs assistance ?  ?Sitting balance-Leahy Scale: Good ?Sitting balance - Comments: EOB, able to balance with no help an dmaintain NWB on LLE ?Postural control: Other (comment) (upright with TLSO) ?  ?Standing balance-Leahy Scale: Poor ?Standing balance comment: fair with UE support on LUE ?  ?  ?  ?  ?  ?  ?  ?  ?  ?  ?  ?  ? ?  ?Cognition Arousal/Alertness: Awake/alert ?Behavior During Therapy: [facility] for tasks assessed/performed ?Overall Cognitive Status: Within Functional Limits for tasks assessed ?  ?  ?  ?  ?  ?  ?  ?  ?  ?  ?  ?  ?  ?  ?  ?  ?  ?  ?  ? ?  ?  Exercises   ? ?  ?General Comments General comments (skin integrity, edema, etc.): Pt was up to stand wiht LUE support and balancing on RLE.  Avoided RUE pressure with sling in place.  Pt has been advised by PA in trauma to minimize any pressure on RUE ?  ?  ? ?Pertinent Vitals/Pain Pain Assessment ?Pain  Assessment: Faces ?Faces Pain Scale: Hurts little more ?Pain Location: LLE and Rt shoulder ?Pain Descriptors / Indicators: Grimacing, Guarding ?Pain Intervention(s): Limited activity within patient's tolerance, Monitored during session, Repositioned  ? ? ?Home Living   ?  ?  ?  ?  ?  ?  ?  ?  ?  ?   ?  ?Prior Function    ?  ?  ?   ? ?PT Goals (current goals can now be found in the care plan section) Acute Rehab PT Goals ?Patient Stated Goal: return to work and hunting ?Progress towards PT goals: Progressing toward goals ? ?  ?Frequency ? ? ? Min 5X/week ? ? ? ?  ?PT Plan Current plan remains appropriate  ? ? ?Co-evaluation   ?  ?  ?  ?  ? ?  ?AM-PAC PT "6 Clicks" Mobility   ?Outcome Measure ? Help needed turning from your back to your side while in a flat bed without using bedrails?: A Little ?Help needed moving from lying on your back to sitting on the side of a flat bed without using bedrails?: A Little ?Help needed moving to and from a bed to a chair (including a wheelchair)?: A Lot ?Help needed standing up from a chair using your arms (e.g., wheelchair or bedside chair)?: A Lot ?Help needed to walk in hospital room?: Total ?Help needed climbing 3-5 steps with a railing? : Total ?6 Click Score: 12 ? ?  ?End of Session Equipment Utilized During Treatment: Other (comment);Back brace (RUE sling) ?Activity Tolerance: Patient tolerated treatment well ?Patient left: in chair;with call bell/phone within reach;with chair alarm set ?Nurse Communication: Mobility status ?PT Visit Diagnosis: Other abnormalities of gait and mobility (R26.89);Difficulty in walking, not elsewhere classified (R26.2);Pain ?Pain - Right/Left: Left ?Pain - part of body: Leg ?  ? ? ?Time: 9604-5409 [phone]) ?PT Time Calculation (min) (ACUTE ONLY): 31 min ? ?Charges:  $Therapeutic Activity: 38-52 mins    ?Ivar Drape ?05/29/2021, 2:33 PM ? ?Samul Dada, PT PhD ?Acute Rehab Dept. Number: [facility] [phone] and [facility] [phone] ? ? ?

## 2021-05-29 NOTE — PMR Pre-admission (Signed)
PMR Admission Coordinator Pre-Admission Assessment  Patient: Devin Roberts is an 61 y.o., male MRN: [id number] DOB: 01/01/1961 Height: 5' 8"  (172.7 cm) Weight: 100.7 kg  Insurance Information HMO:     PPO:      PCP:      IPA:      80/20:      OTHER:  PRIMARY:   [employer]'s [employer]     Policy#: [id number]      Subscriber: Pt  CM Name: Katherina Right      Phone#: [phone]     Fax#: [phone]  Rehab CM Katherina Right with [employer] called and confirmed that pt's rehab stay is approved and requested that initial evals be sent,as well as weekly progress notes Pre-Cert#: [id number]      Employer:  Benefits:  Phone #:      Name:  Eff. Date: active      Deduct:       Out of Pocket Max:       Life Max:  CIR:       SNF:  Outpatient:      Co-Pay:  Home Health:       Co-Pay:  DME:      Co-Pay:  Providers:  SECONDARY: [employer]      Policy#: [id number]     Phone#:   Financial Counselor:       Phone#:   The Data Collection Information Summary for patients in Inpatient Rehabilitation Facilities with attached Privacy Act [place] Records was provided and verbally reviewed with: Patient   Emergency Contact Information Contact Information     Name Relation Home Work [place] Spouse [phone]       Current Medical History  Patient Admitting Diagnosis: Polytrauma History of Present Illness: Devin Roberts is an 61 y.o. male who is here for evaluation after a workplace injury 05/25/21.  They were in the process of moving pallets when he was struck from above on his right shoulder by a piece of the crane and he fell between 2 wooden pallets.  He had obvious injury to his left leg.  He had no loss of consciousness.  He was evaluated by the emergency department and found to have a scapula fracture, L2 vertebral body fracture, and multiple fractures in his left lower extremity and trauma was called to admit the patient. Pt. Wth Pt with L1-2 fx,  complex LLE fx,  Rt scapula fx. RUE in a sling for comfort, can weight bear on RUE per ortho. Pt. To wear TLSO brace when OOB for L2 fracture. Pt. Underwent ORIF of L tib-fib and L malleolus fx on 05/29/19. He is NWB on LLE x 8 wks. CIR was consulted to assist return to PLOF.     Patient's medical record from [facility] has been reviewed by the rehabilitation admission coordinator and physician.  Past Medical History  Past Medical History:  Diagnosis Date   Acute asthmatic bronchitis    Allergic rhinitis    Anxiety    Chronic insomnia    Diabetes mellitus    DJD (degenerative joint disease)    GERD (gastroesophageal reflux disease)    Headache(784.0)    Hypercholesterolemia    Hypertension    Obesity     Has the patient had major surgery during 100 days prior to admission? Yes  Family History   family history includes COPD in his father; Hyperlipidemia in his mother; Lung cancer in his father.  Current Medications  Current  Facility-Administered Medications:    0.9 %  sodium chloride infusion, , Intravenous, Continuous, Meuth, Brooke A, PA-C, Last Rate: 10 mL/hr at 05/29/21 0401, Infusion Verify at 05/29/21 0401   acetaminophen (TYLENOL) tablet 1,000 mg, 1,000 mg, Oral, Q6H, Lovick, Ayesha N, MD, 1,000 mg at 05/29/21 1153   ascorbic acid (VITAMIN C) tablet 1,000 mg, 1,000 mg, Oral, Daily, Ainsley Spinner, PA-C, 1,000 mg at 05/29/21 1028   bisacodyl (DULCOLAX) suppository 10 mg, 10 mg, Rectal, Daily PRN, Kabrich, Martha H, PA-C   Chlorhexidine Gluconate Cloth 2 % PADS 6 each, 6 each, Topical, Daily, Stechschulte, Nickola Major, MD, 6 each at 05/29/21 9476   chlorthalidone (HYGROTON) tablet 25 mg, 25 mg, Oral, Daily, Ainsley Spinner, PA-C, 25 mg at 05/29/21 5465   cholecalciferol (VITAMIN D3) tablet 2,000 Units, 2,000 Units, Oral, BID, Ainsley Spinner, PA-C, 2,000 Units at 05/29/21 1029   dextromethorphan-guaiFENesin ([place] DM) 30-600 MG per 12 hr tablet 1 tablet, 1 tablet, Oral, BID  PRN, Ainsley Spinner, PA-C, 1 tablet at 05/28/21 1301   diphenhydrAMINE (BENADRYL) capsule 25 mg, 25 mg, Oral, Q6H PRN, Ainsley Spinner, PA-C, 25 mg at 05/29/21 0831   docusate sodium (COLACE) capsule 100 mg, 100 mg, Oral, BID, Greer Pickerel, MD, 100 mg at 05/29/21 0827   enoxaparin (LOVENOX) injection 40 mg, 40 mg, Subcutaneous, Q12H, Lovick, Montel Culver, MD   insulin aspart (novoLOG) injection 0-15 Units, 0-15 Units, Subcutaneous, TID WC, Ainsley Spinner, PA-C, 2 Units at 05/29/21 1155   irbesartan (AVAPRO) tablet 300 mg, 300 mg, Oral, Daily, Ainsley Spinner, PA-C, 300 mg at 05/29/21 0827   ketorolac (TORADOL) 15 MG/ML injection 15 mg, 15 mg, Intravenous, Q6H, Lovick, Montel Culver, MD, 15 mg at 05/29/21 1153   methocarbamol (ROBAXIN) tablet 750 mg, 750 mg, Oral, QID, Lovick, Montel Culver, MD   morphine (PF) 2 MG/ML injection 2-4 mg, 2-4 mg, Intravenous, Q3H PRN, Ainsley Spinner, PA-C, 2 mg at 05/26/21 1425   mupirocin ointment (BACTROBAN) 2 % 1 application, 1 application., Nasal, BID, Stechschulte, Nickola Major, MD, 1 application. at 05/29/21 0832   ondansetron (ZOFRAN-ODT) disintegrating tablet 4 mg, 4 mg, Oral, Q6H PRN **OR** ondansetron (ZOFRAN) injection 4 mg, 4 mg, Intravenous, Q6H PRN, Greer Pickerel, MD   oxyCODONE (Oxy IR/ROXICODONE) immediate release tablet 10 mg, 10 mg, Oral, Q4H PRN, Greer Pickerel, MD, 10 mg at 05/29/21 1028   oxyCODONE (Oxy IR/ROXICODONE) immediate release tablet 5 mg, 5 mg, Oral, Q4H PRN, Greer Pickerel, MD, 5 mg at 05/28/21 1731   polyethylene glycol (MIRALAX / GLYCOLAX) packet 17 g, 17 g, Oral, Daily, Meuth, Brooke A, PA-C, 17 g at 05/29/21 0826   zolpidem (AMBIEN) tablet 5 mg, 5 mg, Oral, QHS PRN, Ainsley Spinner, PA-C, 5 mg at 05/28/21 2150  Patients Current Diet:  Diet Order             Diet Carb Modified Fluid consistency: Thin; Room service appropriate? Yes  Diet effective now                   Precautions / Restrictions Precautions Precautions: Back, Fall Precaution Comments: sling  RUE Spinal Brace: Thoracolumbosacral orthotic, Applied in supine position Restrictions Weight Bearing Restrictions: Yes RUE Weight Bearing: Weight bearing as tolerated LLE Weight Bearing: Non weight bearing Other Position/Activity Restrictions: pt was instructed to minimize RUE wgt   Has the patient had 2 or more falls or a fall with injury in the past year? No  Prior Activity Level Community (5-7x/wk): Pt. working and active in the community  PTA  Prior Functional Level Self Care: Did the patient need help bathing, dressing, using the toilet or eating? Independent  Indoor Mobility: Did the patient need assistance with walking from room to room (with or without device)? Independent  Stairs: Did the patient need assistance with internal or external stairs (with or without device)? Independent  Functional Cognition: Did the patient need help planning regular tasks such as shopping or remembering to take medications? Independent  Patient Information Are you of Hispanic, Latino/a,or Spanish origin?: A. No, not of Hispanic, Latino/a, or Spanish origin What is your race?: A. White Do you need or want an interpreter to communicate with a doctor or health care staff?: 0. No  Patient's Response To:  Health Literacy and Transportation Is the patient able to respond to health literacy and transportation needs?: Yes Health Literacy - How often do you need to have someone help you when you read instructions, pamphlets, or other written material from your doctor or pharmacy?: Sometimes In the past 12 months, has lack of transportation kept you from medical appointments or from getting medications?: No In the past 12 months, has lack of transportation kept you from meetings, work, or from getting things needed for daily living?: No  Development worker, international aid / [place] Devices/Equipment: None Home Equipment: None  Prior Device Use: Indicate devices/aids used by the patient prior to  current illness, exacerbation or injury? None of the above  Current Functional Level Cognition  Overall Cognitive Status: Within Functional Limits for tasks assessed Orientation Level: Oriented X4    Extremity Assessment (includes Sensation/Coordination)  Upper Extremity Assessment: RUE deficits/detail RUE Deficits / Details: sling and educated on positioning of the sling. pt educated on management of sling with L UE  Lower Extremity Assessment: Defer to PT evaluation LLE Deficits / Details: immobilized with splint and able to maintain NWB, WFL for hip flexion    ADLs  Overall ADL's : Needs assistance/impaired Eating/Feeding: Set up, Sitting Grooming: Set up, Sitting Upper Body Bathing: Moderate assistance, Sitting Lower Body Bathing: Moderate assistance, Bed level Lower Body Bathing Details (indicate cue type and reason): pt is able to figure 4 cross L LE and use L UE Upper Body Dressing : Total assistance Upper Body Dressing Details (indicate cue type and reason): don TLSO and sling supine Lower Body Dressing: Moderate assistance, Bed level Lower Body Dressing Details (indicate cue type and reason): pt able to don sock and doff sock on LLE in supine. pt attempting to use L UE as much as possible Toilet Transfer: +2 for physical assistance, Moderate assistance, Stand-pivot Toilet Transfer Details (indicate cue type and reason): pivot toward the R side General ADL Comments: pt progressed from bed to chair this session and could use BSC with staff    Mobility  Overal bed mobility: Needs Assistance Bed Mobility: Rolling, Sidelying to Sit Rolling: Min guard Sidelying to sit: Min assist Sit to supine: Mod assist, +2 for physical assistance General bed mobility comments: min assist to manage back precautions    Transfers  Overall transfer level: Needs assistance Equipment used: 1 person hand held assist Transfers: Sit to/from Stand, Bed to chair/wheelchair/BSC Sit to Stand: Min  assist Bed to/from chair/wheelchair/BSC transfer type:: Stand pivot Stand pivot transfers: Min assist General transfer comment: pt with LLE placed on PT foot to ensure NWB. pt able to achieve full upright posture. Pt able to pivot on R ankle to chair and (A) to lower to surface. Will need reinforcement and practice to keep  weight fully off of LLE    Ambulation / Gait / Stairs / Wheelchair Mobility  Ambulation/Gait General Gait Details: unable    Posture / Balance Dynamic Sitting Balance Sitting balance - Comments: EOB, able to balance with no help an dmaintain NWB on LLE Balance Overall balance assessment: Needs assistance Sitting balance-Leahy Scale: Good Sitting balance - Comments: EOB, able to balance with no help an dmaintain NWB on LLE Postural control: Other (comment) (upright with TLSO) Standing balance support: Single extremity supported, During functional activity Standing balance-Leahy Scale: Poor Standing balance comment: fair with UE support on LUE    Special needs/care consideration Skin surgical incisions and Special service needs TSLO brace when OOB, Sling for RUE   Previous Home Environment (from acute therapy documentation) Living Arrangements: Spouse/significant other  Lives With: Spouse Available Help at Discharge: Family, Available 24 hours/day Type of Home: House Home Layout: Two level, Able to live on main level with bedroom/bathroom Home Access: Stairs to enter Entrance Stairs-Number of Steps: 2 Bathroom Shower/Tub: Multimedia programmer: Standard Bathroom Accessibility: Yes How Accessible: Accessible via walker Home Care Services: No Additional Comments: wife measure the bathroom distances and due to a pony wall in the bathroom between the toilet and the shower pt can not access the toilet via wheelchair. Pt will likely need BSC. p thas adustable bed at home. Pt has doorway if the doors are removed the w/c can pass the door frame with  w/c  Discharge Living Setting Plans for Discharge Living Setting: Patient's home Type of Home at Discharge: House Discharge Home Layout: One level Discharge Home Access: Stairs to enter Entrance Stairs-Rails: None Entrance Stairs-Number of Steps: 2 Discharge Bathroom Shower/Tub: Walk-in shower Discharge Bathroom Toilet: Standard Discharge Bathroom Accessibility: No Does the patient have any problems obtaining your medications?: No  Social/Family/Support Systems Patient Roles: Spouse Contact Information: [phone] Anticipated Caregiver: Kavan Devan Anticipated Caregiver's Contact Information: [phone] Ability/Limitations of Caregiver: Can provid Min A (Will have to return to work in 1 week, other family may be able to assist) Caregiver Availability: 24/7 Discharge Plan Discussed with Primary Caregiver: Yes Is Caregiver In Agreement with Plan?: Yes Does Caregiver/Family have Issues with Lodging/Transportation while Pt is in Rehab?: Yes  Goals Patient/Family Goal for Rehab: PT/OT Mod I Expected length of stay: 14-16 days Pt/Family Agrees to Admission and willing to participate: Yes Program Orientation Provided & Reviewed with Pt/Caregiver Including Roles  & Responsibilities: Yes  Decrease burden of Care through IP rehab admission: Specialzed equipment needs and Patient/family education  Possible need for SNF placement upon discharge: not anticipated   Patient Condition: I have reviewed medical records from [facility], spoken with CM, and patient and spouse. I met with patient at the bedside for inpatient rehabilitation assessment.  Patient will benefit from ongoing PT, OT, and SLP, can actively participate in 3 hours of therapy a day 5 days of the week, and can make measurable gains during the admission.  Patient will also benefit from the coordinated team approach during an Inpatient Acute Rehabilitation admission.  The patient will receive intensive therapy  as well as Rehabilitation physician, nursing, social worker, and care management interventions.  Due to safety, skin/wound care, medication administration, pain management, and patient education the patient requires 24 hour a day rehabilitation nursing.  The patient is currently min A with mobility and basic ADLs.  Discharge setting and therapy post discharge at home with home health is anticipated.  Patient has agreed to participate in the Acute  Inpatient Rehabilitation Program and will admit today.  Preadmission Screen Completed By:  Genella Mech, 05/29/2021 1:54 PM ______________________________________________________________________   Discussed status with Dr. Ranell Patrick  on 05/30/21 at 1000 and received approval for admission today.  Admission Coordinator:  Genella Mech, CCC-SLP, time 1000/Date 05/30/21   Assessment/Plan: Diagnosis: Polytrauma Does the need for close, 24 hr/day Medical supervision in concert with the patient's rehab needs make it unreasonable for this patient to be served in a less intensive setting? Yes Co-Morbidities requiring supervision/potential complications: obesity, HLD, HTN, allergic rhinitis, GERD Due to bladder management, bowel management, safety, skin/wound care, disease management, medication administration, pain management, and patient education, does the patient require 24 hr/day rehab nursing? Yes Does the patient require coordinated care of a physician, rehab nurse, PT, OT to address physical and functional deficits in the context of the above medical diagnosis(es)? Yes Addressing deficits in the following areas: balance, endurance, locomotion, strength, transferring, bowel/bladder control, bathing, dressing, feeding, grooming, toileting, and psychosocial support Can the patient actively participate in an intensive therapy program of at least 3 hrs of therapy 5 days a week? Yes The potential for patient to make measurable gains while on inpatient rehab is  excellent Anticipated functional outcomes upon discharge from inpatient rehab: modified independent PT, modified independent OT, independent SLP Estimated rehab length of stay to reach the above functional goals is: 10-14 days Anticipated discharge destination: Home 10. Overall Rehab/Functional Prognosis: excellent   MD Signature: Leeroy Cha, MD

## 2021-05-29 NOTE — Progress Notes (Signed)
Occupational Therapy Treatment ?Patient Details ?Name: Devin Roberts ?MRN: [id number] ?DOB: 1961-02-13 ?Today's Date: 05/29/2021 ? ? ?History of present illness 61 yo admitted 3/3 after workplace accident where crane fell on his shoulder and he fell between 2 pallets. Pt with L1-2 fx, complex LLE fx,3/6 L ORIF ankle pilon fx  Rt scapula fx. PMhx: HTN, HLD, DM, anxiety, obesity ?  ?OT comments ? Pt with new orders for WBAT R UE for transfer so educated during session on platform RW for bathroom transfers. Pt with excellent return demo. Wife provided education and return demo on TLSO don doff during session. Pt and wife eager to progress patient to level of returning home. Pt will have sister and mom to (A) patient if wife is at work or leaving the home. Recommendation for CIR 7 days to decrease burden of care for family. Pt progressing quickly at this time. Pt meeting 3 goals this session.   ? ?Recommendations for follow up therapy are one component of a multi-disciplinary discharge planning process, led by the attending physician.  Recommendations may be updated based on patient status, additional functional criteria and insurance authorization. ?   ?Follow Up Recommendations ? Acute inpatient rehab (3hours/day)  ?  ?Assistance Recommended at Discharge Set up Supervision/Assistance  ?Patient can return home with the following ? Assistance with cooking/housework;Assistance with feeding;Help with stairs or ramp for entrance;Assist for transportation;A little help with walking and/or transfers;A little help with bathing/dressing/bathroom ?  ?Equipment Recommendations ? BSC/3in1;Wheelchair (measurements OT);Wheelchair cushion (measurements OT);Other (comment) (platform walker)  ?  ?Recommendations for Other Services Rehab consult ? ?  ?Precautions / Restrictions Precautions ?Precautions: Back;Fall ?Precaution Comments: R UE sling comfort only at this time ?Required Braces or Orthoses: Spinal Brace;Sling ?Spinal Brace:  Thoracolumbosacral orthotic;Applied in supine position ?Restrictions ?Weight Bearing Restrictions: Yes ?RUE Weight Bearing: Weight bearing as tolerated ?LLE Weight Bearing: Non weight bearing ?Other Position/Activity Restrictions: pt was instructed to minimize RUE wgt  ? ? ?  ? ?Mobility Bed Mobility ?Overal bed mobility: Modified Independent ?  ?  ?  ?  ?  ?  ?General bed mobility comments: pt progress from supine to sit. pt rolling L to place brace MOD I and helped educate the wife on placement of the brace ?  ? ?Transfers ?Overall transfer level: Needs assistance ?Equipment used: Right platform walker, Rolling walker (2 wheels) ?Transfers: Sit to/from Stand ?Sit to Stand: Min guard ?  ?  ?  ?  ?  ?General transfer comment: pt progressed from standard bed height to platform walker min guard. pt educated on arm placement. Pt able to stand with just L UE and R UE against chest then place on platform without (A) ?  ?  ?Balance Overall balance assessment: Needs assistance ?  ?Sitting balance-Leahy Scale: Normal ?  ?  ?Standing balance support: Bilateral upper extremity supported, During functional activity, Reliant on assistive device for balance ?Standing balance-Leahy Scale: Good ?Standing balance comment: pt is able to sustain LLE off the ground for transfer ~ 12 ft ?  ?  ?  ?  ?  ?  ?  ?  ?  ?  ?  ?   ? ?ADL either performed or assessed with clinical judgement  ? ?ADL Overall ADL's : Needs assistance/impaired ?  ?  ?  ?  ?  ?  ?  ?  ?  ?  ?  ?  ?Toilet Transfer: Supervision/safety;BSC/3in1;Ambulation;Rolling walker (2 wheels) (platform) ?Toilet Transfer Details (indicate cue type and reason):  pt able to progress from EOB to the bathroom onto the bsc with min cues for rw . pt sustained NWB L LE entire transfer ?Toileting- Clothing Manipulation and Hygiene: Supervision/safety;Sitting/lateral lean ?Toileting - Clothing Manipulation Details (indicate cue type and reason): no void but did peri care as pt had  attempted ?  ?  ?Functional mobility during ADLs: Min guard;Rolling walker (2 wheels) ?  ?  ? ?Extremity/Trunk Assessment Upper Extremity Assessment ?Upper Extremity Assessment: RUE deficits/detail ?RUE Deficits / Details: educate on washing under arm and keep close to the core. Educated on the change in [place] and use during transfer ?  ?Lower Extremity Assessment ?Lower Extremity Assessment: Defer to PT evaluation ?  ?  ?  ? ?Vision   ?  ?  ?Perception   ?  ?Praxis   ?  ? ?Cognition Arousal/Alertness: Awake/alert ?Behavior During Therapy: [facility] for tasks assessed/performed ?Overall Cognitive Status: Within Functional Limits for tasks assessed ?  ?  ?  ?  ?  ?  ?  ?  ?  ?  ?  ?  ?  ?  ?  ?  ?  ?  ?  ?   ?Exercises Exercises: Other exercises ?Other Exercises ?Other Exercises: elevation R UE and LLE for edema management ?Other Exercises: wife educated on don of brace in supine. pt with mod I removing the brace at the end of session with pt using the bed rail to roll. ? ?  ?Shoulder Instructions   ? ? ?  ?General Comments pt premedicated at start of session. pt with ice applied to R UE after transfer as this is the first attempt with platform walker  ? ? ?Pertinent Vitals/ Pain       Pain Assessment ?Pain Assessment: 0-10 ?Pain Score: 2  ?Pain Location: R shoulder sore ?Pain Descriptors / Indicators: Sore ?Pain Intervention(s): Monitored during session, Premedicated before session, Repositioned ? ?Home Living   ?  ?  ?  ?  ?  ?  ?  ?  ?  ?  ?  ?  ?Bathroom Accessibility: Yes ?How Accessible: Accessible via walker ?  ?  ?  ? Lives With: Spouse ? ?  ?Prior Functioning/Environment    ?  ?  ?  ?   ? ?Frequency ? Min 2X/week  ? ? ? ? ?  ?Progress Toward Goals ? ?OT Goals(current goals can now be found in the care plan section) ? Progress towards OT goals: Progressing toward goals ? ?Acute Rehab OT Goals ?Patient Stated Goal: to go to CIR ?OT Goal Formulation: With patient/family ?Time For Goal Achievement: 06/10/21 ?Potential  to Achieve Goals: Good ?ADL Goals ?Pt Will Perform Upper Body Bathing: with min assist;bed level ?Pt Will Perform Lower Body Bathing: with mod assist;sit to/from stand ?Pt Will Transfer to Toilet:  (met) ?Additional ADL Goal #1: met ?Additional ADL Goal #2:  (met)  ?Plan Discharge plan remains appropriate   ? ?Co-evaluation ? ? ?   ?  ?  ?  ?  ? ?  ?AM-PAC OT "6 Clicks" Daily Activity     ?Outcome Measure ? ? Help from another person eating meals?: A Little ?Help from another person taking care of personal grooming?: A Little ?Help from another person toileting, which includes using toliet, bedpan, or urinal?: A Little ?Help from another person bathing (including washing, rinsing, drying)?: A Little ?Help from another person to put on and taking off regular upper body clothing?: A Little ?Help from another person to  put on and taking off regular lower body clothing?: A Lot ?6 Click Score: 17 ? ?  ?End of Session Equipment Utilized During Treatment: Gait belt;Back brace ? ?OT Visit Diagnosis: Unsteadiness on feet (R26.81);Muscle weakness (generalized) (M62.81);Pain ?Pain - Right/Left: Right ?Pain - part of body: Arm ?  ?Activity Tolerance Patient tolerated treatment well ?  ?Patient Left in bed;in CPM;with family/visitor present ?  ?Nurse Communication Mobility status;Precautions;Weight bearing status ?  ? ?   ? ?Time: 1499-6924 ?OT Time Calculation (min): 60 min ? ?Charges: OT General Charges ?$OT Visit: 1 Visit ?OT Treatments ?$Self Care/Home Management : 53-67 mins ? ? ?Brynn, OTR/L  ?Acute Rehabilitation Services ?Pager: [phone] ?Office: [phone] ?. ? ? ?Jeri Modena ?05/29/2021, 3:49 PM ?

## 2021-05-30 ENCOUNTER — Encounter (HOSPITAL_COMMUNITY): Payer: Self-pay | Admitting: Physical Medicine and Rehabilitation

## 2021-05-30 ENCOUNTER — Inpatient Hospital Stay (HOSPITAL_COMMUNITY)
Admission: RE | Admit: 2021-05-30 | Discharge: 2021-06-07 | DRG: 561 | Disposition: A | Discharge status: Home / Self Care | Payer: Worker's Compensation | Source: Intra-hospital | Attending: Physical Medicine and Rehabilitation | Admitting: Physical Medicine and Rehabilitation

## 2021-05-30 ENCOUNTER — Other Ambulatory Visit: Payer: Self-pay

## 2021-05-30 DIAGNOSIS — Z885 Allergy status to narcotic agent status: Secondary | ICD-10-CM | POA: Diagnosis not present

## 2021-05-30 DIAGNOSIS — K5900 Constipation, unspecified: Secondary | ICD-10-CM | POA: Diagnosis present

## 2021-05-30 DIAGNOSIS — R7989 Other specified abnormal findings of blood chemistry: Secondary | ICD-10-CM | POA: Diagnosis present

## 2021-05-30 DIAGNOSIS — W3189XD Contact with other specified machinery, subsequent encounter: Secondary | ICD-10-CM | POA: Diagnosis not present

## 2021-05-30 DIAGNOSIS — Z79899 Other long term (current) drug therapy: Secondary | ICD-10-CM | POA: Diagnosis not present

## 2021-05-30 DIAGNOSIS — S82872D Displaced pilon fracture of left tibia, subsequent encounter for closed fracture with routine healing: Secondary | ICD-10-CM

## 2021-05-30 DIAGNOSIS — Z713 Dietary counseling and surveillance: Secondary | ICD-10-CM

## 2021-05-30 DIAGNOSIS — E669 Obesity, unspecified: Secondary | ICD-10-CM | POA: Diagnosis present

## 2021-05-30 DIAGNOSIS — T07XXXA Unspecified multiple injuries, initial encounter: Secondary | ICD-10-CM | POA: Diagnosis present

## 2021-05-30 DIAGNOSIS — S82402D Unspecified fracture of shaft of left fibula, subsequent encounter for closed fracture with routine healing: Secondary | ICD-10-CM

## 2021-05-30 DIAGNOSIS — Z888 Allergy status to other drugs, medicaments and biological substances status: Secondary | ICD-10-CM

## 2021-05-30 DIAGNOSIS — F5104 Psychophysiologic insomnia: Secondary | ICD-10-CM | POA: Diagnosis present

## 2021-05-30 DIAGNOSIS — Z801 Family history of malignant neoplasm of trachea, bronchus and lung: Secondary | ICD-10-CM

## 2021-05-30 DIAGNOSIS — E1165 Type 2 diabetes mellitus with hyperglycemia: Secondary | ICD-10-CM | POA: Diagnosis present

## 2021-05-30 DIAGNOSIS — D72829 Elevated white blood cell count, unspecified: Secondary | ICD-10-CM | POA: Diagnosis present

## 2021-05-30 DIAGNOSIS — E785 Hyperlipidemia, unspecified: Secondary | ICD-10-CM | POA: Diagnosis present

## 2021-05-30 DIAGNOSIS — E119 Type 2 diabetes mellitus without complications: Secondary | ICD-10-CM | POA: Diagnosis present

## 2021-05-30 DIAGNOSIS — Y990 Civilian activity done for income or pay: Secondary | ICD-10-CM

## 2021-05-30 DIAGNOSIS — S32029D Unspecified fracture of second lumbar vertebra, subsequent encounter for fracture with routine healing: Secondary | ICD-10-CM | POA: Diagnosis not present

## 2021-05-30 DIAGNOSIS — I10 Essential (primary) hypertension: Secondary | ICD-10-CM | POA: Diagnosis present

## 2021-05-30 DIAGNOSIS — S32019D Unspecified fracture of first lumbar vertebra, subsequent encounter for fracture with routine healing: Secondary | ICD-10-CM

## 2021-05-30 DIAGNOSIS — Z9109 Other allergy status, other than to drugs and biological substances: Secondary | ICD-10-CM | POA: Diagnosis not present

## 2021-05-30 DIAGNOSIS — Z825 Family history of asthma and other chronic lower respiratory diseases: Secondary | ICD-10-CM

## 2021-05-30 DIAGNOSIS — E781 Pure hyperglyceridemia: Secondary | ICD-10-CM | POA: Diagnosis present

## 2021-05-30 DIAGNOSIS — K219 Gastro-esophageal reflux disease without esophagitis: Secondary | ICD-10-CM | POA: Diagnosis present

## 2021-05-30 DIAGNOSIS — E559 Vitamin D deficiency, unspecified: Secondary | ICD-10-CM

## 2021-05-30 DIAGNOSIS — Z6833 Body mass index (BMI) 33.0-33.9, adult: Secondary | ICD-10-CM

## 2021-05-30 DIAGNOSIS — F419 Anxiety disorder, unspecified: Secondary | ICD-10-CM | POA: Diagnosis present

## 2021-05-30 DIAGNOSIS — S42111D Displaced fracture of body of scapula, right shoulder, subsequent encounter for fracture with routine healing: Secondary | ICD-10-CM | POA: Diagnosis present

## 2021-05-30 DIAGNOSIS — Z83438 Family history of other disorder of lipoprotein metabolism and other lipidemia: Secondary | ICD-10-CM

## 2021-05-30 DIAGNOSIS — Z7982 Long term (current) use of aspirin: Secondary | ICD-10-CM

## 2021-05-30 LAB — GLUCOSE, CAPILLARY
Glucose-Capillary: 108 mg/dL — ABNORMAL HIGH (ref 70–99)
Glucose-Capillary: 106 mg/dL — ABNORMAL HIGH (ref 70–99)
Glucose-Capillary: 120 mg/dL — ABNORMAL HIGH (ref 70–99)
Glucose-Capillary: 153 mg/dL — ABNORMAL HIGH (ref 70–99)

## 2021-05-30 MED ORDER — INSULIN ASPART 100 UNIT/ML IJ SOLN
0.0000 [IU] | Freq: Three times a day (TID) | INTRAMUSCULAR | Status: DC
  Administered 2021-05-31 – 2021-06-02 (×7): 2 [IU] via SUBCUTANEOUS
  Administered 2021-06-02: 1 [IU] via SUBCUTANEOUS
  Administered 2021-06-03: 3 [IU] via SUBCUTANEOUS
  Administered 2021-06-03 – 2021-06-06 (×9): 2 [IU] via SUBCUTANEOUS

## 2021-05-30 MED ORDER — VITAMIN D 25 MCG (1000 UNIT) PO TABS
2000.0000 [IU] | ORAL_TABLET | Freq: Two times a day (BID) | ORAL | Status: DC
Start: 2021-05-30 — End: 2021-06-07
  Administered 2021-05-30 – 2021-06-07 (×16): 2000 [IU] via ORAL
  Filled 2021-05-30 (×16): qty 2

## 2021-05-30 MED ORDER — ICOSAPENT ETHYL 1 G PO CAPS
1.0000 g | ORAL_CAPSULE | Freq: Every day | ORAL | Status: DC
  Administered 2021-05-30 – 2021-06-06 (×8): 1 g via ORAL
  Filled 2021-05-30 (×8): qty 1

## 2021-05-30 MED ORDER — IRBESARTAN 300 MG PO TABS: 300.0000 mg | ORAL_TABLET | Freq: Every day | ORAL | Status: DC

## 2021-05-30 MED ORDER — CHLORTHALIDONE 25 MG PO TABS
25.0000 mg | ORAL_TABLET | Freq: Every day | ORAL | Status: DC
Start: 2021-05-31 — End: 2021-06-07
  Administered 2021-05-31 – 2021-06-07 (×8): 25 mg via ORAL
  Filled 2021-05-30 (×8): qty 1

## 2021-05-30 MED ORDER — SORBITOL 70 % SOLN
30.0000 mL | Freq: Every day | Status: DC | PRN
Start: 2021-05-30 — End: 2021-06-07
  Administered 2021-06-03: 30 mL via ORAL
  Filled 2021-05-30: qty 30

## 2021-05-30 MED ORDER — MAGNESIUM HYDROXIDE 400 MG/5ML PO SUSP: 30.0000 mL | Freq: Every day | ORAL | Status: DC | PRN

## 2021-05-30 MED ORDER — ALUM & MAG HYDROXIDE-SIMETH 200-200-20 MG/5ML PO SUSP
30.0000 mL | ORAL | Status: DC | PRN
  Administered 2021-06-05: 30 mL via ORAL
  Filled 2021-05-30: qty 30

## 2021-05-30 MED ORDER — PROCHLORPERAZINE 25 MG RE SUPP: 12.5000 mg | Freq: Four times a day (QID) | RECTAL | Status: DC | PRN

## 2021-05-30 MED ORDER — ENOXAPARIN SODIUM 40 MG/0.4ML IJ SOSY: 40.0000 mg | PREFILLED_SYRINGE | INTRAMUSCULAR | Status: DC

## 2021-05-30 MED ORDER — OXYCODONE HCL 5 MG PO TABS
10.0000 mg | ORAL_TABLET | ORAL | Status: DC | PRN
  Administered 2021-05-30 – 2021-06-05 (×7): 10 mg via ORAL
  Filled 2021-05-30 (×10): qty 2

## 2021-05-30 MED ORDER — TRAZODONE HCL 50 MG PO TABS: 25.0000 mg | ORAL_TABLET | Freq: Every evening | ORAL | Status: DC | PRN

## 2021-05-30 MED ORDER — ENOXAPARIN SODIUM 40 MG/0.4ML IJ SOSY
40.0000 mg | PREFILLED_SYRINGE | Freq: Two times a day (BID) | INTRAMUSCULAR | Status: DC
Start: 2021-05-30 — End: 2021-06-07
  Administered 2021-05-30 – 2021-06-07 (×16): 40 mg via SUBCUTANEOUS
  Filled 2021-05-30 (×16): qty 0.4

## 2021-05-30 MED ORDER — PRAVASTATIN SODIUM 40 MG PO TABS
40.0000 mg | ORAL_TABLET | Freq: Every day | ORAL | Status: DC
Start: 2021-05-30 — End: 2021-06-07
  Administered 2021-05-30 – 2021-06-06 (×8): 40 mg via ORAL
  Filled 2021-05-30 (×8): qty 1

## 2021-05-30 MED ORDER — METHOCARBAMOL 750 MG PO TABS
750.0000 mg | ORAL_TABLET | Freq: Four times a day (QID) | ORAL | Status: DC
  Administered 2021-05-30 – 2021-06-07 (×31): 750 mg via ORAL
  Filled 2021-05-30 (×32): qty 1

## 2021-05-30 MED ORDER — ACETAMINOPHEN 325 MG PO TABS
325.0000 mg | ORAL_TABLET | ORAL | Status: DC | PRN
  Administered 2021-05-31 – 2021-06-06 (×3): 650 mg via ORAL
  Filled 2021-05-30 (×3): qty 2

## 2021-05-30 MED ORDER — PROCHLORPERAZINE EDISYLATE 10 MG/2ML IJ SOLN: 5.0000 mg | Freq: Four times a day (QID) | INTRAMUSCULAR | Status: DC | PRN

## 2021-05-30 MED ORDER — ASCORBIC ACID 500 MG PO TABS
1000.0000 mg | ORAL_TABLET | Freq: Every day | ORAL | Status: DC
  Administered 2021-05-31 – 2021-06-07 (×8): 1000 mg via ORAL
  Filled 2021-05-30 (×8): qty 2

## 2021-05-30 MED ORDER — ZOLPIDEM TARTRATE 5 MG PO TABS
5.0000 mg | ORAL_TABLET | Freq: Every evening | ORAL | Status: DC | PRN
  Administered 2021-05-30 – 2021-05-31 (×2): 5 mg via ORAL
  Filled 2021-05-30 (×2): qty 1

## 2021-05-30 MED ORDER — OXYCODONE HCL 5 MG PO TABS
5.0000 mg | ORAL_TABLET | ORAL | Status: DC | PRN
  Administered 2021-05-31 – 2021-06-07 (×16): 5 mg via ORAL
  Filled 2021-05-30 (×14): qty 1

## 2021-05-30 MED ORDER — MUPIROCIN 2 % EX OINT
1.0000 "application " | TOPICAL_OINTMENT | Freq: Two times a day (BID) | CUTANEOUS | Status: AC
  Administered 2021-05-30 – 2021-06-01 (×4): 1 via NASAL
  Filled 2021-05-30: qty 22

## 2021-05-30 MED ORDER — GUAIFENESIN-DM 100-10 MG/5ML PO SYRP: 5.0000 mL | ORAL_SOLUTION | Freq: Four times a day (QID) | ORAL | Status: DC | PRN

## 2021-05-30 MED ORDER — IRBESARTAN 300 MG PO TABS
300.0000 mg | ORAL_TABLET | Freq: Every day | ORAL | Status: DC
  Administered 2021-05-31 – 2021-06-07 (×8): 300 mg via ORAL
  Filled 2021-05-30 (×8): qty 1

## 2021-05-30 MED ORDER — CHLORTHALIDONE 25 MG PO TABS
25.0000 mg | ORAL_TABLET | Freq: Every day | ORAL | Status: DC
Start: 2021-05-30 — End: 2021-05-30
  Filled 2021-05-30: qty 1

## 2021-05-30 MED ORDER — FLEET ENEMA 7-19 GM/118ML RE ENEM: 1.0000 | ENEMA | Freq: Once | RECTAL | Status: DC | PRN

## 2021-05-30 MED ORDER — DOCUSATE SODIUM 100 MG PO CAPS
100.0000 mg | ORAL_CAPSULE | Freq: Two times a day (BID) | ORAL | Status: DC
  Administered 2021-05-30 – 2021-06-07 (×16): 100 mg via ORAL
  Filled 2021-05-30 (×16): qty 1

## 2021-05-30 MED ORDER — ASCORBIC ACID 500 MG PO TABS: 1000.0000 mg | ORAL_TABLET | Freq: Every day | ORAL | Status: DC

## 2021-05-30 MED ORDER — PROCHLORPERAZINE MALEATE 5 MG PO TABS: 5.0000 mg | ORAL_TABLET | Freq: Four times a day (QID) | ORAL | Status: DC | PRN

## 2021-05-30 NOTE — Discharge Instructions (Addendum)
You may bear weight through your right arm as tolerated and use a sling as needed. No weight bearing on your left leg until cleared by orthopedics - approximately 8 weeks. You will need to continue lovenox injections for 21 days from surgery ?Wear your back brace when out of bed ?

## 2021-05-30 NOTE — Progress Notes (Signed)
Inpatient Rehabilitation Admission Medication Review by a Pharmacist ? ?A complete drug regimen review was completed for this patient to identify any potential clinically significant medication issues. ? ?High Risk Drug Classes Is patient taking? Indication by Medication  ?Antipsychotic Yes Compazine for N/V  ?Anticoagulant Yes Lovenox for VTE ppx  ?Antibiotic No   ?Opioid Yes Oxycodone prn for pain  ?Antiplatelet No   ?Hypoglycemics/insulin Yes SSI for DM  ?Vasoactive Medication Yes Chlorthalidone, Avapro for BP  ?Chemotherapy No   ?Other Yes Vascepa, Pravastatin for HLD ?Robaxin for muscle spasms  ? ? ? ?Type of Medication Issue Identified Description of Issue Recommendation(s)  ?Drug Interaction(s) (clinically significant) ?    ?Duplicate Therapy ?    ?Allergy ?    ?No Medication Administration End Date ?    ?Incorrect Dose ?    ?Additional Drug Therapy Needed ?    ?Significant med changes from prior encounter (inform family/care partners about these prior to discharge).    ?Other ?    ? ? ?Clinically significant medication issues were identified that warrant physician communication and completion of prescribed/recommended actions by midnight of the next day:  No ? ?Pharmacist comments: Resumed HLD therapy ? ?Time spent performing this drug regimen review (minutes):  20 minutes ? ? ?Devin Roberts ?05/30/2021 2:48 PM ?

## 2021-05-30 NOTE — Progress Notes (Signed)
Inpatient Rehab Admissions Coordinator:  ? ?I have a CIR bed for this Pt. Today. RN may call report to [phone]. ? ?Megan Salon, MS, CCC-SLP ?Rehab Admissions Coordinator  ?[phone] (celll) ?[phone] (office) ? ?

## 2021-05-30 NOTE — Progress Notes (Signed)
? ?                              Orthopaedic Trauma Service Progress Note ? ?Patient ID: ?Devin Roberts ?MRN: [id number] ?DOB/AGE: [birth year] 61 y.o. ? ?Subjective: ? ?Doing very well ?No acute changes ?Hopeful for CIR today  ? ? ?ROS ?As above ? ?Objective:  ? ?VITALS:   ?Vitals:  ? 05/29/21 2007 05/29/21 2008 05/30/21 0455 05/30/21 0746  ?BP:  122/76 137/83 139/81  ?Pulse: 69 70 61 67  ?Resp: 18 18 17 17   ?Temp: 98.4 ?F (36.9 ?C) 98.6 ?F (37 ?C) 98.3 ?F (36.8 ?C) 98.1 ?F (36.7 ?C)  ?TempSrc: Oral Oral Oral Oral  ?SpO2: 95% 96% 97% 96%  ?Weight:      ?Height:      ? ? ?Estimated body mass index is 33.75 kg/m? as calculated from the following: ?  Height as of this encounter: 5\' 8"  (1.727 m). ?  Weight as of this encounter: 100.7 kg. ? ? ?Intake/Output   ?   03/07 0701 ?03/08 0700 03/08 0701 ?03/09 0700  ? P.O. 240   ? I.V. (mL/kg) 189.8 (1.9)   ? IV Piggyback    ? Total Intake(mL/kg) 429.8 (4.3)   ? Urine (mL/kg/hr) 1230 (0.5)   ? Blood    ? Total Output 1230   ? Net -800.2   ?     ?  ? ?LABS ? ?Results for orders placed or performed during the hospital encounter of 05/25/21 (from the past 24 hour(s))  ?Glucose, capillary     Status: Abnormal  ? Collection Time: 05/29/21 11:28 AM  ?Result Value Ref Range  ? Glucose-Capillary 149 (H) 70 - 99 mg/dL  ?Glucose, capillary     Status: Abnormal  ? Collection Time: 05/29/21  5:01 PM  ?Result Value Ref Range  ? Glucose-Capillary 190 (H) 70 - 99 mg/dL  ?Glucose, capillary     Status: Abnormal  ? Collection Time: 05/29/21  8:10 PM  ?Result Value Ref Range  ? Glucose-Capillary 124 (H) 70 - 99 mg/dL  ?Glucose, capillary     Status: Abnormal  ? Collection Time: 05/30/21  7:45 AM  ?Result Value Ref Range  ? Glucose-Capillary 108 (H) 70 - 99 mg/dL  ? ? ? ?PHYSICAL EXAM:  ? ?Gen: resting comfortably in bed, NAD, appears well, pleasant  ?Lungs: unlabored ?Cardiac: reg ?Abd: + BS ?Ext:  ?     Left Lower Extremity  ?            Dressing c/d/I ?             Ext warm  ?            + DP pulse ?            DPN, SPN, TN sensation intact ?            EHL, FHL, lesser to motor intact ?            No pain out of proportion with stretching toes  ?            Good perfusion distally  ?  ?     Right Upper Extremity  ?            Moving shoulder very well actively  ?            No acute changes ?  Motor and sensory functions intact ?            Minimal swelling  ?            + radial pulse  ? ?Assessment/Plan: ?2 Days Post-Op  ? ?Principal Problem: ?  Multiple fractures ? ? ?Anti-infectives (From admission, onward)  ? ? Start     Dose/Rate Route Frequency Ordered Stop  ? 05/28/21 1700  ceFAZolin (ANCEF) IVPB 2g/100 mL premix       ? 2 g ?200 mL/hr over 30 Minutes Intravenous Every 8 hours 05/28/21 1233 05/29/21 0926  ? 05/28/21 0700  ceFAZolin (ANCEF) IVPB 2g/100 mL premix       ? 2 g ?200 mL/hr over 30 Minutes Intravenous On call to O.R. 05/27/21 1230 05/28/21 0920  ? ?  ?. ? ?POD/HD#: 27 ? ?61 year old male work-related accident with multiple fractures ?  ?-Work-related accident ?  ?-Multiple orthopedic injuries ?              Comminuted right scapular body fracture ?              Left distal tibia fracture including extra-articular component, posterior malleolus and left proximal fibula fracture ?  ?              Right scapula fracture---> non-op ?                            Sling for comfort ?                            Early range of motion ?                            Can use right upper extremity to help mobilize ?                            Ok to [employer] thru R UEx  ?  ?  ?              Left distal tibia fracture, posterior malleolus fracture, left proximal tibia fracture s/p ORIF tibia and syndesmosis  ?                            NWB L leg x 8 weeks ?                             Splint x 2 weeks then CAM boot  ?   Sutures out aroun 06/11/2021 ?   Will convert to CAM at that time  ?                             Ice and elevate  ?                              PT/OT evals ?  ?                            Unrestricted Knee and hip ROM  ?  ?-Lumbar fractures ?  Per neurosurgery ?  ?- Pain management: ?              Continue with current regimen ?  ?- ABL anemia/Hemodynamics ?              Stable ?  ?- Medical issues  ?              Hypertension ?                            Blood pressures look good ?  ?- DVT/PE prophylaxis: ?              SCD R leg  ?              Lovenox  ?                        Would do lovenox x 21 days post op  ?  ?- ID:  ?              Periop abx completed  ?  ?- Metabolic Bone Disease: ?              vitamin d low normal  ?                        Supplement  ?  ?- Activity: ?              NWB L leg ?               ?- FEN/GI prophylaxis/Foley/Lines: ?              carb mod diet ?  ?- Impediments to fracture healing: ?              DM ?  ?- Dispo: ?             therapies ?             CIR possibly today  ?             Ortho issues addressed  ? ?Mearl LatinKeith W. Sheilla Maris, PA-C ?[phone] (C) ?05/30/2021, 9:59 AM ? ?[facility] ?[address] ?[place] [address] ?[phone] [place](O) ?[phone] (F) ? ? ? ?After 5pm and on the weekends please log on to Amion, go to orthopaedics and the look under the [facility] Call for the provider(s) on call. You can also call our office at [phone] and then follow the prompts to be connected to the call team.  ? Patient ID: Devin SavannahRickey L Kassa, male   DOB: 08/10/1960, 61 y.o.   MRN: [id number] ? ?

## 2021-05-30 NOTE — H&P (Signed)
Physical Medicine and Rehabilitation Admission H&P  CC: Functional deficits secondary to trauma  HPI: Devin Roberts is a 61 year old male who sustained an injury while working.  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  He was brought to [facility].  He was alert and oriented x4 and hemodynamically stable.  Imaging revealed left tibia/fibular shaft fracture and underwent ORIF repair by Dr. Carola FrostHandy.  He also sustained a right scapular body fracture that did not require operative treatment.  He was initially nonweightbearing and sling.  CT scan revealed a vertical fracture through the L1 vertebral body and a mild compression fracture of L2.  Neurosurgery (Dr. Marikay Alaravid Jones) was consulted and he was fitted for TLSO brace.  Laboratory work-up revealed elevated serum creatinine consistent with acute kidney injury.  This normalized quickly and has been normal in the last 3 days.  Elevated white blood cell count to 14,000 without fever or signs of infection.  Orthopedic recommendations as follows: Continue Lovenox for approximately 21 days.  This would take him through 3/27.  He can use sling for comfort for right upper extremity and can use right upper extremity to help mobilize.  May weight-bear through right upper extremity.  He is nonweightbearing of left lower extremity for 8 weeks.  He will continue his splint for 2 weeks then can convert cam boot at that time.  Recommend suture removal around 3/20.  Unrestricted left knee and hip range of motion. TLSO when out of bed. The patient requires inpatient medicine and rehabilitation evaluations and services for ongoing dysfunction secondary to polytrauma.  Patient's past medical history is significant for hypertension, hyperlipidemia.  During hospitalization he has had elevated glucoses and his A1c is 6.5.  He currently complains of right shoulder pain.  Review of Systems  Constitutional:  Negative for chills and fever.  HENT:  Negative for hearing loss.   Eyes:  Negative for blurred vision.  Respiratory:  Negative for shortness of breath.   Cardiovascular:  Negative for chest pain.  Gastrointestinal:  Positive for constipation. Negative for abdominal pain, nausea and vomiting.  Genitourinary:  Negative for dysuria and urgency.  Musculoskeletal:  Positive for back pain.       Right shoulder pain  Skin:  Negative for itching and rash.  Neurological:  Negative for dizziness and headaches.  Past Medical History:  Diagnosis Date   Acute asthmatic bronchitis    Allergic rhinitis    Anxiety    Chronic insomnia    Diabetes mellitus    DJD (degenerative joint disease)    GERD (gastroesophageal reflux disease)    Headache(784.0)    Hypercholesterolemia    Hypertension    Obesity    Past Surgical History:  Procedure Laterality Date   ORIF ANKLE FRACTURE Left 05/28/2021   Procedure: OPEN REDUCTION INTERNAL FIXATION (ORIF) TIBIA/FIBULA FRACTURE;  Surgeon: Myrene GalasHandy, Michael, MD;  Location: [facility] OR;  Service: Orthopedics;  Laterality: Left;   SHOULDER SURGERY     Family History  Problem Relation Age of Onset   COPD Father    Lung cancer Father    Hyperlipidemia Mother    Cancer Neg Hx    Diabetes Neg Hx    Heart disease Neg Hx    Hypertension Neg Hx    Kidney disease Neg Hx    Stroke  Neg Hx    Social History:  reports that he has never smoked. He has never used smokeless tobacco. He reports that he does not drink alcohol and does not use drugs. Allergies:  Allergies  Allergen Reactions   Codeine Itching   Lisinopril     REACTION: Allergic to ACE inhibitors w/ cough   Simvastatin     REACTION: pt states ZOCOR caused leg cramps   Medications Prior to Admission  Medication Sig Dispense Refill   aspirin EC 81 MG tablet Take 1 tablet (81 mg total) by mouth daily. (Patient not taking:  Reported on 05/25/2021) 90 tablet 3   azithromycin (ZITHROMAX) 250 MG tablet Take 250 mg by mouth See admin instructions. Zpack. Start date : 05/20/20     Cholecalciferol 50 MCG (2000 UT) TABS Take 2 tablets (4,000 Units total) by mouth daily. (Patient taking differently: Take 2 tablets by mouth every evening.) 180 tablet 1   EDARBYCLOR 40-25 MG TABS Take 1 tablet by mouth daily. (Patient taking differently: Take 1 tablet by mouth every evening.) 90 tablet 0   icosapent Ethyl (VASCEPA) 1 g capsule TAKE (2) CAPSULES BY MOUTH TWICE DAILY. (Patient taking differently: 1 g at bedtime.) 360 capsule 1   meloxicam (MOBIC) 15 MG tablet TAKE ONE TABLET BY MOUTH DAILY (Patient taking differently: Take 15 mg by mouth daily as needed for pain.) 90 tablet 0   Polyethyl Glycol-Propyl Glycol (SYSTANE ULTRA) 0.4-0.3 % SOLN Place 1 drop into both eyes every morning.     pravastatin (PRAVACHOL) 40 MG tablet Take 1 tablet (40 mg total) by mouth at bedtime. 90 tablet 1   sodium chloride (OCEAN) 0.65 % SOLN nasal spray Place 1 spray into both nostrils daily as needed for congestion.     zolpidem (AMBIEN) 10 MG tablet TAKE ONE TABLET BY MOUTH AT BEDTIME AS NEEDED FOR SLEEP (Patient taking differently: Take 10 mg by mouth at bedtime.) 90 tablet 0   Home: Home Living Family/patient expects to be discharged to:: Private residence Living Arrangements: Spouse/significant other Available Help at Discharge: Family, Available 24 hours/day Type of Home: House Home Access: Stairs to enter Secretary/administrator of Steps: 2 Home Layout: Two level, Able to live on main level with bedroom/bathroom Bathroom Shower/Tub: Health visitor: Standard Bathroom Accessibility: Yes Home Equipment: None Additional Comments: wife measure the bathroom distances and due to a pony wall in the bathroom between the toilet and the shower pt can not access the toilet via wheelchair. Pt will likely need BSC. p thas adustable bed at  home. Pt has doorway if the doors are removed the w/c can pass the door frame with w/c  Lives With: Spouse   Functional History: Prior Function Prior Level of Function : Independent/Modified Independent ADLs Comments: working full-time   Functional Status:  Mobility: Bed Mobility Overal bed mobility: Modified Independent Bed Mobility: Rolling, Sidelying to Sit Rolling: Min guard Sidelying to sit: Min assist Sit to supine: Mod assist, +2 for physical assistance General bed mobility comments: pt progress from supine to sit. pt rolling L to place brace MOD I and helped educate the wife on placement of the brace Transfers Overall transfer level: Needs assistance Equipment used: Right platform walker, Rolling walker (2 wheels) Transfers: Sit to/from Stand Sit to Stand: Min guard Bed to/from chair/wheelchair/BSC transfer type:: Stand pivot Stand pivot transfers: Min assist General transfer comment: pt progressed from standard bed height to platform walker min guard. pt educated on arm placement. Pt able to stand  with just L UE and R UE against chest then place on platform without (A) Ambulation/Gait General Gait Details: unable   ADL: ADL Overall ADL's : Needs assistance/impaired Eating/Feeding: Set up, Sitting Grooming: Set up, Sitting Upper Body Bathing: Moderate assistance, Sitting Lower Body Bathing: Moderate assistance, Bed level Lower Body Bathing Details (indicate cue type and reason): pt is able to figure 4 cross L LE and use L UE Upper Body Dressing : Total assistance Upper Body Dressing Details (indicate cue type and reason): don TLSO and sling supine Lower Body Dressing: Moderate assistance, Bed level Lower Body Dressing Details (indicate cue type and reason): pt able to don sock and doff sock on LLE in supine. pt attempting to use L UE as much as possible Toilet Transfer: Supervision/safety, BSC/3in1, Ambulation, Rolling walker (2 wheels) (platform) Toilet Transfer  Details (indicate cue type and reason): pt able to progress from EOB to the bathroom onto the bsc with min cues for rw . pt sustained NWB L LE entire transfer Toileting- Clothing Manipulation and Hygiene: Supervision/safety, Sitting/lateral lean Toileting - Clothing Manipulation Details (indicate cue type and reason): no void but did peri care as pt had attempted Functional mobility during ADLs: Min guard, Rolling walker (2 wheels) General ADL Comments: pt progressed from bed to chair this session and could use BSC with staff   Cognition: Cognition Overall Cognitive Status: Within Functional Limits for tasks assessed Orientation Level: Oriented X4 Cognition Arousal/Alertness: Awake/alert Behavior During Therapy: WFL for tasks assessed/performed Overall Cognitive Status: Within Functional Limits for tasks assessed   Physical Exam: Blood pressure 139/81, pulse 67, temperature 98.1 F (36.7 C), temperature source Oral, resp. rate 17, height  (1.727 m), weight 100.7 kg, SpO2 96 %. Physical Exam Constitutional:      Appearance: Normal appearance. He is obese. BMI 33.75 HENT:     Head: Normocephalic and atraumatic.  Eyes:     Extraocular Movements: Extraocular movements intact.     Pupils: Pupils are equal, round, and reactive to light.  Cardiovascular:     Rate and Rhythm: Normal rate and regular rhythm.  Pulmonary:     Effort: Pulmonary effort is normal.     Breath sounds: Normal breath sounds.  Abdominal:     General: Bowel sounds are normal.     Palpations: Abdomen is soft.  Musculoskeletal:     Comments: LLE splint and surgical dressing in place . Strength and sensation intact. RUE strength and sensation intact Skin:    General: Skin is warm and dry.  Neurological:     General: No focal deficit present.     Mental Status: He is alert and oriented to person, place, and time.  Psychiatric:        Mood and Affect: Mood normal.   Bright and positive affect     Behavior:  Behavior normal.        Thought Content: Thought content normal.    Physical Exam: Blood pressure 132/84, pulse 71, temperature 98.9 F (37.2 C), resp. rate 16, SpO2 95 %. Constitutional:      Appearance: Normal appearance. He is obese. BMI 33.75 HENT:     Head: Normocephalic and atraumatic.  Eyes:     Extraocular Movements: Extraocular movements intact.     Pupils: Pupils are equal, round, and reactive to light.  Cardiovascular:     Rate and Rhythm: Normal rate and regular rhythm.  Pulmonary:     Effort: Pulmonary effort is normal.     Breath sounds: Normal breath  sounds.  Abdominal:     General: Bowel sounds are normal.     Palpations: Abdomen is soft.  Musculoskeletal:     Comments: LLE splint and surgical dressing in place . Strength and sensation intact. RUE strength and sensation intact Skin:    General: Skin is warm and dry.  Neurological:     General: No focal deficit present.     Mental Status: He is alert and oriented to person, place, and time.  Psychiatric:        Mood and Affect: Mood normal.   Bright and positive affect     Behavior: Behavior normal.        Thought Content: Thought content normal.   Results for orders placed or performed during the hospital encounter of 05/25/21 (from the past 48 hour(s))  Glucose, capillary     Status: Abnormal   Collection Time: 05/28/21  5:30 PM  Result Value Ref Range   Glucose-Capillary 179 (H) 70 - 99 mg/dL    Comment: Glucose reference range applies only to samples taken after fasting for at least 8 hours.  Glucose, capillary     Status: Abnormal   Collection Time: 05/28/21  9:31 PM  Result Value Ref Range   Glucose-Capillary 154 (H) 70 - 99 mg/dL    Comment: Glucose reference range applies only to samples taken after fasting for at least 8 hours.  CBC     Status: Abnormal   Collection Time: 05/29/21  1:30 AM  Result Value Ref Range   WBC 14.0 (H) 4.0 - 10.5 K/uL   RBC 3.72 (L) 4.22 - 5.81 MIL/uL   Hemoglobin 12.1  (L) 13.0 - 17.0 g/dL   HCT 33.0 (L) 07.6 - 22.6 %   MCV 92.7 80.0 - 100.0 fL   MCH 32.5 26.0 - 34.0 pg   MCHC 35.1 30.0 - 36.0 g/dL   RDW 33.3 54.5 - 62.5 %   Platelets 189 150 - 400 K/uL   nRBC 0.0 0.0 - 0.2 %    Comment: Performed at [facility], [address] [place], [place] [address]  Glucose, capillary     Status: Abnormal   Collection Time: 05/29/21  7:49 AM  Result Value Ref Range   Glucose-Capillary 130 (H) 70 - 99 mg/dL    Comment: Glucose reference range applies only to samples taken after fasting for at least 8 hours.  Glucose, capillary     Status: Abnormal   Collection Time: 05/29/21 11:28 AM  Result Value Ref Range   Glucose-Capillary 149 (H) 70 - 99 mg/dL    Comment: Glucose reference range applies only to samples taken after fasting for at least 8 hours.  Glucose, capillary     Status: Abnormal   Collection Time: 05/29/21  5:01 PM  Result Value Ref Range   Glucose-Capillary 190 (H) 70 - 99 mg/dL    Comment: Glucose reference range applies only to samples taken after fasting for at least 8 hours.  Glucose, capillary     Status: Abnormal   Collection Time: 05/29/21  8:10 PM  Result Value Ref Range   Glucose-Capillary 124 (H) 70 - 99 mg/dL    Comment: Glucose reference range applies only to samples taken after fasting for at least 8 hours.  Glucose, capillary     Status: Abnormal   Collection Time: 05/30/21  7:45 AM  Result Value Ref Range   Glucose-Capillary 108 (H) 70 - 99 mg/dL    Comment: Glucose reference range applies  only to samples taken after fasting for at least 8 hours.  Glucose, capillary     Status: Abnormal   Collection Time: 05/30/21 11:48 AM  Result Value Ref Range   Glucose-Capillary 106 (H) 70 - 99 mg/dL    Comment: Glucose reference range applies only to samples taken after fasting for at least 8 hours.   DG Lumbar Spine 2-3 Views  Result Date: 05/29/2021 CLINICAL DATA:  L2 vertebral fracture EXAM: LUMBAR SPINE - 2-3 VIEW COMPARISON:   05/25/2021 CT chest, abdomen and pelvis FINDINGS: This report assumes 5 non rib-bearing lumbar vertebrae. Acute anterior inferior L1 vertebral fracture without significant loss of vertebral body height, with small displaced 0.8 cm fracture fragment anterior to the lower L1 vertebral body. Acute comminuted L2 vertebral body fracture with approximately 10-20% loss of vertebral body height, without definite fracture extension to the posterior margin of the L2 vertebral body. Mild multilevel lumbar degenerative disc disease. Mild 3 mm retrolisthesis at L1-2 and L2-3. No significant facet arthropathy. No aggressive appearing focal osseous lesions. IMPRESSION: 1. Acute anterior inferior L1 vertebral fracture without significant loss of vertebral body height. 2. Acute comminuted L2 vertebral body fracture with approximately 10-20% loss of vertebral body height. 3. Mild multilevel lumbar degenerative disc disease. Mild multilevel lumbar spondylolisthesis as detailed. Electronically Signed   By: Delbert Phenix M.D.   On: 05/29/2021 11:08      Blood pressure 132/84, pulse 71, temperature 98.9 F (37.2 C), resp. rate 16, SpO2 95 %.  Medical Problem List and Plan: 1. Functional deficits secondary to polytrauma  -patient may shower  -ELOS/Goals: S 10-14 days  -Admit to CIR 2.  Antithrombotics: -DVT/anticoagulation:  Pharmaceutical: Lovenox  -antiplatelet therapy: None (was on aspirin 81 mg daily prior to admission) 3. Pain Management: Tylenol as needed, Robaxin 3 times daily, oxycodone as needed  -ice and elevate LLE 4. Mood: LCSW to evaluate and provide emotional support  -antipsychotic agents: n/a 5. Neuropsych: This patient is capable of making decisions on his own behalf. 6. Skin/Wound Care: Routine skin care checks  -- Monitor LLE incisions: Sutures out approximately 3/20 7. Fluids/Electrolytes/Nutrition: Routine I's and O's and follow-up chemistries  --continue carb modified diet 8: POD 2 ORIF left  pilon fracture and syndesmosis, left proximal tibia fracture  --NWB times 8 weeks  --splint times 2 weeks, then CAM boot (3/20)  --unrestricted left knee and hip ROM 9: right scapular fracture: non-operative repair  --sling for comfort  --early ROM  --can use RUE to help mobilize  --may weight bear through RUE 10: L1, L2 vertebral body compression fractures  --continue TSLO when out of bed 11: Hyperlipidemia: Pravachol (and Vascepa) on home med list but statin listed as allergy/intolerant>>wife says not allergic and has been tolerating so will restart 12: Hypertension: continue chlorthalidone 25 mg daily  --on Edarbyclor 40/25 at home 13: Hyperglycemia with A1c = 6.5  --Continue CBGs QID and SSI 14: Constipation: continue scheduled Colace and PRNs ordered  --denies discomfort 15: Insomnia, chronic: takes Ambien 10 mg every night>>will resume 16. Obesity BMI 33.5: provide dietary education  I have personally performed a face to face diagnostic evaluation, including, but not limited to relevant history and physical exam findings, of this patient and developed relevant assessment and plan.  Additionally, I have reviewed and concur with the physician assistant's documentation above.  Sula Soda, MD  Milinda Antis, PA-C 05/30/2021

## 2021-05-30 NOTE — H&P (Signed)
Physical Medicine and Rehabilitation Admission H&P    Chief Complaint  Patient presents with   Fall  CC: Functional deficits secondary to trauma  HPI: Devin Roberts is a 61 year old male who sustained an injury while working.  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  He was brought to [facility] emergency department via [facility].  He was alert and oriented x4 and hemodynamically stable.  Imaging revealed left tibia/fibular shaft fracture and underwent ORIF repair by Dr. Carola Frost.  He also sustained a right scapular body fracture that did not require operative treatment.  He was initially nonweightbearing and sling.  CT scan revealed a vertical fracture through the L1 vertebral body and a mild compression fracture of L2.  Neurosurgery (Dr. Marikay Alar) was consulted and he was fitted for TLSO brace.  Laboratory work-up revealed elevated serum creatinine consistent with acute kidney injury.  This normalized quickly and has been normal in the last 3 days.  Elevated white blood cell count to 14,000 without fever or signs of infection.  Orthopedic recommendations as follows: Continue Lovenox for approximately 21 days.  This would take him through 3/27.  He can use sling for comfort for right upper extremity and can use right upper extremity to help mobilize.  May weight-bear through right upper extremity.  He is nonweightbearing of left lower extremity for 8 weeks.  He will continue his splint for 2 weeks then can convert cam boot at that time.  Recommend suture removal around 3/20.  Unrestricted left knee and hip range of motion. TLSO when out of bed. The patient requires inpatient medicine and rehabilitation evaluations and services for ongoing dysfunction secondary to polytrauma.  Patient's past medical history is significant for hypertension, hyperlipidemia.  During  hospitalization he has had elevated glucoses and his A1c is 6.5. He currently complains of right shoulder pain.  Review of Systems  Constitutional:  Negative for chills and fever.  HENT:  Negative for hearing loss.   Eyes:  Negative for blurred vision.  Respiratory:  Negative for shortness of breath.   Cardiovascular:  Negative for chest pain.  Gastrointestinal:  Positive for constipation. Negative for abdominal pain, nausea and vomiting.  Genitourinary:  Negative for dysuria and urgency.  Musculoskeletal:  Positive for back pain.       Right shoulder pain  Skin:  Negative for itching and rash.  Neurological:  Negative for dizziness and headaches.  Past Medical History:  Diagnosis Date   Acute asthmatic bronchitis    Allergic rhinitis    Anxiety    Chronic insomnia    Diabetes mellitus    DJD (degenerative joint disease)    GERD (gastroesophageal reflux disease)    Headache(784.0)    Hypercholesterolemia    Hypertension    Obesity    Past Surgical History:  Procedure Laterality Date   ORIF ANKLE FRACTURE Left 05/28/2021   Procedure: OPEN REDUCTION INTERNAL FIXATION (ORIF) TIBIA/FIBULA FRACTURE;  Surgeon: Myrene Galas, MD;  Location: [facility] OR;  Service: Orthopedics;  Laterality: Left;   SHOULDER SURGERY     Family History  Problem Relation Age of Onset   COPD Father    Lung cancer Father    Hyperlipidemia Mother    Cancer Neg Hx    Diabetes Neg Hx    Heart disease Neg Hx    Hypertension Neg  Hx    Kidney disease Neg Hx    Stroke Neg Hx    Social History:  reports that he has never smoked. He has never used smokeless tobacco. He reports that he does not drink alcohol and does not use drugs. Allergies:  Allergies  Allergen Reactions   Codeine Itching   Lisinopril     REACTION: Allergic to ACE inhibitors w/ cough   Simvastatin     REACTION: pt states ZOCOR caused leg cramps   Medications Prior to Admission  Medication Sig Dispense Refill   azithromycin (ZITHROMAX)  250 MG tablet Take 250 mg by mouth See admin instructions. Zpack. Start date : 05/20/20     Cholecalciferol 50 MCG (2000 UT) TABS Take 2 tablets (4,000 Units total) by mouth daily. (Patient taking differently: Take 2 tablets by mouth every evening.) 180 tablet 1   EDARBYCLOR 40-25 MG TABS Take 1 tablet by mouth daily. (Patient taking differently: Take 1 tablet by mouth every evening.) 90 tablet 0   icosapent Ethyl (VASCEPA) 1 g capsule TAKE (2) CAPSULES BY MOUTH TWICE DAILY. (Patient taking differently: 1 g at bedtime.) 360 capsule 1   meloxicam (MOBIC) 15 MG tablet TAKE ONE TABLET BY MOUTH DAILY (Patient taking differently: Take 15 mg by mouth daily as needed for pain.) 90 tablet 0   Polyethyl Glycol-Propyl Glycol (SYSTANE ULTRA) 0.4-0.3 % SOLN Place 1 drop into both eyes every morning.     pravastatin (PRAVACHOL) 40 MG tablet Take 1 tablet (40 mg total) by mouth at bedtime. 90 tablet 1   sodium chloride (OCEAN) 0.65 % SOLN nasal spray Place 1 spray into both nostrils daily as needed for congestion.     zolpidem (AMBIEN) 10 MG tablet TAKE ONE TABLET BY MOUTH AT BEDTIME AS NEEDED FOR SLEEP (Patient taking differently: Take 10 mg by mouth at bedtime.) 90 tablet 0   aspirin EC 81 MG tablet Take 1 tablet (81 mg total) by mouth daily. (Patient not taking: Reported on 05/25/2021) 90 tablet 3      Home: Home Living Family/patient expects to be discharged to:: Private residence Living Arrangements: Spouse/significant other Available Help at Discharge: Family, Available 24 hours/day Type of Home: House Home Access: Stairs to enter [employer] of Steps: 2 Home Layout: Two level, Able to live on main level with bedroom/bathroom Bathroom Shower/Tub: Health visitor: Standard Bathroom Accessibility: Yes Home Equipment: None Additional Comments: wife measure the bathroom distances and due to a pony wall in the bathroom between the toilet and the shower pt can not access the  toilet via wheelchair. Pt will likely need BSC. p thas adustable bed at home. Pt has doorway if the doors are removed the w/c can pass the door frame with w/c  Lives With: Spouse   Functional History: Prior Function Prior Level of Function : Independent/Modified Independent ADLs Comments: working full-time  Functional Status:  Mobility: Bed Mobility Overal bed mobility: Modified Independent Bed Mobility: Rolling, Sidelying to Sit Rolling: Min guard Sidelying to sit: Min assist Sit to supine: Mod assist, +2 for physical assistance General bed mobility comments: pt progress from supine to sit. pt rolling L to place brace MOD I and helped educate the wife on placement of the brace Transfers Overall transfer level: Needs assistance Equipment used: Right platform walker, Rolling walker (2 wheels) Transfers: Sit to/from Stand Sit to Stand: Min guard Bed to/from chair/wheelchair/BSC transfer type:: Stand pivot Stand pivot transfers: Min assist General transfer comment: pt progressed from standard bed height  to platform walker min guard. pt educated on arm placement. Pt able to stand with just L UE and R UE against chest then place on platform without (A) Ambulation/Gait General Gait Details: unable    ADL: ADL Overall ADL's : Needs assistance/impaired Eating/Feeding: Set up, Sitting Grooming: Set up, Sitting Upper Body Bathing: Moderate assistance, Sitting Lower Body Bathing: Moderate assistance, Bed level Lower Body Bathing Details (indicate cue type and reason): pt is able to figure 4 cross L LE and use L UE Upper Body Dressing : Total assistance Upper Body Dressing Details (indicate cue type and reason): don TLSO and sling supine Lower Body Dressing: Moderate assistance, Bed level Lower Body Dressing Details (indicate cue type and reason): pt able to don sock and doff sock on LLE in supine. pt attempting to use L UE as much as possible Toilet Transfer: Supervision/safety,  BSC/3in1, Ambulation, Rolling walker (2 wheels) (platform) Toilet Transfer Details (indicate cue type and reason): pt able to progress from EOB to the bathroom onto the bsc with min cues for rw . pt sustained NWB L LE entire transfer Toileting- Clothing Manipulation and Hygiene: Supervision/safety, Sitting/lateral lean Toileting - Clothing Manipulation Details (indicate cue type and reason): no void but did peri care as pt had attempted Functional mobility during ADLs: Min guard, Rolling walker (2 wheels) General ADL Comments: pt progressed from bed to chair this session and could use BSC with staff  Cognition: Cognition Overall Cognitive Status: Within Functional Limits for tasks assessed Orientation Level: Oriented X4 Cognition Arousal/Alertness: Awake/alert Behavior During Therapy: WFL for tasks assessed/performed Overall Cognitive Status: Within Functional Limits for tasks assessed  Physical Exam: Blood pressure 139/81, pulse 67, temperature 98.1 F (36.7 C), temperature source Oral, resp. rate 17, height  (1.727 m), weight 100.7 kg, SpO2 96 %. Physical Exam Constitutional:      Appearance: Normal appearance. He is obese. BMI 33.75 HENT:     Head: Normocephalic and atraumatic.  Eyes:     Extraocular Movements: Extraocular movements intact.     Pupils: Pupils are equal, round, and reactive to light.  Cardiovascular:     Rate and Rhythm: Normal rate and regular rhythm.  Pulmonary:     Effort: Pulmonary effort is normal.     Breath sounds: Normal breath sounds.  Abdominal:     General: Bowel sounds are normal.     Palpations: Abdomen is soft.  Musculoskeletal:     Comments: LLE splint and surgical dressing in place . Strength and sensation intact. RUE strength and sensation intact Skin:    General: Skin is warm and dry.  Neurological:     General: No focal deficit present.     Mental Status: He is alert and oriented to person, place, and time.  Psychiatric:         Mood and Affect: Mood normal.   Bright and positive affect     Behavior: Behavior normal.        Thought Content: Thought content normal.   Results for orders placed or performed during the hospital encounter of 05/25/21 (from the past 48 hour(s))  Glucose, capillary     Status: Abnormal   Collection Time: 05/28/21 11:12 AM  Result Value Ref Range   Glucose-Capillary 148 (H) 70 - 99 mg/dL    Comment: Glucose reference range applies only to samples taken after fasting for at least 8 hours.  Glucose, capillary     Status: Abnormal   Collection Time: 05/28/21  5:30 PM  Result Value  Ref Range   Glucose-Capillary 179 (H) 70 - 99 mg/dL    Comment: Glucose reference range applies only to samples taken after fasting for at least 8 hours.  Glucose, capillary     Status: Abnormal   Collection Time: 05/28/21  9:31 PM  Result Value Ref Range   Glucose-Capillary 154 (H) 70 - 99 mg/dL    Comment: Glucose reference range applies only to samples taken after fasting for at least 8 hours.  CBC     Status: Abnormal   Collection Time: 05/29/21  1:30 AM  Result Value Ref Range   WBC 14.0 (H) 4.0 - 10.5 K/uL   RBC 3.72 (L) 4.22 - 5.81 MIL/uL   Hemoglobin 12.1 (L) 13.0 - 17.0 g/dL   HCT 16.1 (L) 09.6 - 04.5 %   MCV 92.7 80.0 - 100.0 fL   MCH 32.5 26.0 - 34.0 pg   MCHC 35.1 30.0 - 36.0 g/dL   RDW 40.9 81.1 - 91.4 %   Platelets 189 150 - 400 K/uL   nRBC 0.0 0.0 - 0.2 %    Comment: Performed at [facility], [address], [place], [place] [address]  Glucose, capillary     Status: Abnormal   Collection Time: 05/29/21  7:49 AM  Result Value Ref Range   Glucose-Capillary 130 (H) 70 - 99 mg/dL    Comment: Glucose reference range applies only to samples taken after fasting for at least 8 hours.  Glucose, capillary     Status: Abnormal   Collection Time: 05/29/21 11:28 AM  Result Value Ref Range   Glucose-Capillary 149 (H) 70 - 99 mg/dL    Comment: Glucose reference range applies only to samples  taken after fasting for at least 8 hours.  Glucose, capillary     Status: Abnormal   Collection Time: 05/29/21  5:01 PM  Result Value Ref Range   Glucose-Capillary 190 (H) 70 - 99 mg/dL    Comment: Glucose reference range applies only to samples taken after fasting for at least 8 hours.  Glucose, capillary     Status: Abnormal   Collection Time: 05/29/21  8:10 PM  Result Value Ref Range   Glucose-Capillary 124 (H) 70 - 99 mg/dL    Comment: Glucose reference range applies only to samples taken after fasting for at least 8 hours.  Glucose, capillary     Status: Abnormal   Collection Time: 05/30/21  7:45 AM  Result Value Ref Range   Glucose-Capillary 108 (H) 70 - 99 mg/dL    Comment: Glucose reference range applies only to samples taken after fasting for at least 8 hours.   DG Lumbar Spine 2-3 Views  Result Date: 05/29/2021 CLINICAL DATA:  L2 vertebral fracture EXAM: LUMBAR SPINE - 2-3 VIEW COMPARISON:  05/25/2021 CT chest, abdomen and pelvis FINDINGS: This report assumes 5 non rib-bearing lumbar vertebrae. Acute anterior inferior L1 vertebral fracture without significant loss of vertebral body height, with small displaced 0.8 cm fracture fragment anterior to the lower L1 vertebral body. Acute comminuted L2 vertebral body fracture with approximately 10-20% loss of vertebral body height, without definite fracture extension to the posterior margin of the L2 vertebral body. Mild multilevel lumbar degenerative disc disease. Mild 3 mm retrolisthesis at L1-2 and L2-3. No significant facet arthropathy. No aggressive appearing focal osseous lesions. IMPRESSION: 1. Acute anterior inferior L1 vertebral fracture without significant loss of vertebral body height. 2. Acute comminuted L2 vertebral body fracture with approximately 10-20% loss of vertebral body height.  3. Mild multilevel lumbar degenerative disc disease. Mild multilevel lumbar spondylolisthesis as detailed. Electronically Signed   By: Delbert PhenixJason A Poff  M.D.   On: 05/29/2021 11:08   DG Tibia/Fibula Left  Result Date: 05/28/2021 CLINICAL DATA:  Fluoroscopic assistance for reduction and internal fixation of fracture of distal tibia EXAM: LEFT TIBIA AND FIBULA - 2 VIEW COMPARISON:  05/25/2021 FINDINGS: Fluoroscopic images show reduction and internal fixation of comminuted fracture of distal radius. There are 2 surgical screws traversing distal fibula and tibia suggesting internal fixation of inferior tibiofibular joint. Fluoroscopic time was 58 seconds. Radiation dose is 2.25 mGy. IMPRESSION: Fluoroscopic assistance was provided for reduction and internal fixation of fracture of distal tibia. Electronically Signed   By: Ernie AvenaPalani  Rathinasamy M.D.   On: 05/28/2021 10:55   DG Ankle Complete Left  Result Date: 05/28/2021 CLINICAL DATA:  Postoperative exam.  Left ankle fracture. EXAM: LEFT ANKLE COMPLETE - 3+ VIEW COMPARISON:  CT left ankle 05/25/2021, left ankle radiographs 05/25/2021 FINDINGS: There is overlying casting material. New medial plate and screw fixation of the distal tibia. Additional anterior approach distal tibial metadiaphyseal screw. New distal fibular lateral plate and screw fixation hardware that also secures the distal tibiofibular syndesmosis. Marked improvement in prior displaced distal tibial metadiaphyseal fracture. Minimally displaced posterior malleolar fracture is again noted. The ankle mortise is symmetric and intact. Minimal distal medial malleolar degenerative osteophytes. There is partial visualization of a likely fracture of the mid left fibular diaphysis with mild lateral apex angulation. IMPRESSION:: IMPRESSION: 1. Interval ORIF of the distal tibia and distal tibiofibular syndesmosis with improved now anatomic alignment. 2. Partial visualization of a likely acute fracture of the mid left fibular diaphysis with mild lateral apex angulation. Electronically Signed   By: Neita Garnetonald  Viola M.D.   On: 05/28/2021 12:27   DG C-Arm 1-60 Min-No  Report  Result Date: 05/28/2021 Fluoroscopy was utilized by the requesting physician.  No radiographic interpretation.   DG C-Arm 1-60 Min-No Report  Result Date: 05/28/2021 Fluoroscopy was utilized by the requesting physician.  No radiographic interpretation.      Blood pressure 139/81, pulse 67, temperature 98.1 F (36.7 C), temperature source Oral, resp. rate 17, height 5\' 8"  (1.727 m), weight 100.7 kg, SpO2 96 %.  Medical Problem List and Plan: 1. Functional deficits secondary to polytrauma  -patient may shower  -ELOS/Goals: S 10-14 days  -Admit to CIR 2.  Antithrombotics: -DVT/anticoagulation:  Pharmaceutical: Lovenox  -antiplatelet therapy: None (was on aspirin 81 mg daily prior to admission) 3. Pain Management: Tylenol as needed, Robaxin 3 times daily, oxycodone as needed  -ice and elevate LLE 4. Mood: LCSW to evaluate and provide emotional support  -antipsychotic agents: n/a 5. Neuropsych: This patient is capable of making decisions on his own behalf. 6. Skin/Wound Care: Routine skin care checks  -- Monitor LLE incisions: Sutures out approximately 3/20 7. Fluids/Electrolytes/Nutrition: Routine I's and O's and follow-up chemistries  --continue carb modified diet 8: POD 2 ORIF left pilon fracture and syndesmosis, left proximal tibia fracture  --NWB times 8 weeks  --splint times 2 weeks, then CAM boot (3/20)  --unrestricted left knee and hip ROM 9: right scapular fracture: non-operative repair  --sling for comfort  --early ROM  --can use RUE to help mobilize  --may weight bear through RUE 10: L1, L2 vertebral body compression fractures  --continue TSLO when out of bed 11: Hyperlipidemia: Pravachol (and Vascepa) on home med list but statin listed as allergy/intolerant>>wife says not allergic and has been tolerating so  will restart 12: Hypertension: continue chlorthalidone 25 mg daily  --on Edarbyclor 40/25 at home 13: Hyperglycemia with A1c = 6.5  --Continue CBGs QID  and SSI 14: Constipation: continue scheduled Colace and PRNs ordered  --denies discomfort 15: Insomnia, chronic: takes Ambien 10 mg every night>>will resume 16. Obesity BMI 33.5: provide dietary education  I have personally performed a face to face diagnostic evaluation, including, but not limited to relevant history and physical exam findings, of this patient and developed relevant assessment and plan.  Additionally, I have reviewed and concur with the physician assistant's documentation above.  Sula Soda, MD  Milinda Antis, PA-C 05/30/2021

## 2021-05-30 NOTE — TOC Transition Note (Signed)
Transition of Care (TOC) - CM/SW Discharge Note ? ? ?Patient Details  ?Name: Devin Roberts ?MRN: [id number] ?Date of Birth: 1960/04/01 ? ?Transition of Care [facility]) CM/SW Contact:  ?Glennon Mac, RN ?Phone Number: ?05/30/2021, 10:37 AM ? ? ?Clinical Narrative:    ?Patient medically stable for discharge today, and has been accepted for admission to [facility].  Plan dc to CIR when bed ready.  Spoke with Neita Carp with Worker's Comp; he requested progress notes from 05/29/21 and 05/30/21.  Faxed clinical information as requested to [phone].   ? ? ?Final next level of care: IP Rehab Facility ?Barriers to Discharge: Barriers Resolved ? ? ?Patient Goals and CMS Choice ?Patient states their goals for this hospitalization and ongoing recovery are:: to get better ?CMS Medicare.gov Compare Post Acute Care list provided to:: Patient ?Choice offered to / list presented to : Patient ? ?  ?           ?  ?  ?  ?  ? ?Discharge Plan and Services ?  ?Discharge Planning Services: CM Consult ?Post Acute Care Choice: IP Rehab          ?  ?  ?  ?  ?  ?  ?  ?  ?  ?  ? ?Social Determinants of Health (SDOH) Interventions ?  ? ? ?Readmission Risk Interventions ?No flowsheet data found. ? ?Quintella Baton, RN, BSN  ?Trauma/Neuro ICU Case Manager ?[phone] ? ? ? ?

## 2021-05-30 NOTE — Discharge Summary (Signed)
Physician Discharge Summary  ?Patient ID: ?Devin Roberts ?MRN: [id number] ?DOB/AGE: Jun 26, 1960 61 y.o. ? ?Admit date: 05/25/2021 ?Discharge date: 05/30/2021 ? ?Admission Diagnoses ?Multiple fractures [T07.XXXA] ?Trauma [T14.90XA] ?Ankle fracture [S82.899A] ?Ankle fracture, bimalleolar, closed, left, initial encounter [S82.842A] ?Closed fracture of proximal end of left fibula, unspecified fracture morphology, initial encounter [S82.832A] ?Closed fracture of right scapula, unspecified part of scapula, initial encounter [S42.101A] ?Compression fracture of L1 vertebra, initial encounter ([place]) [S32.010A] ?Compression fracture of L2 vertebra, initial encounter ([place]) [X32.440N] ? ?Discharge Diagnoses ?Multiple fractures [T07.XXXA] ?Trauma [T14.90XA] ?Ankle fracture [S82.899A] ?Ankle fracture, bimalleolar, closed, left, initial encounter [S82.842A] ?Closed fracture of proximal end of left fibula, unspecified fracture morphology, initial encounter [U27.253G] S/p ORIF ?Closed fracture of right scapula, unspecified part of scapula, initial encounter [S42.101A] ?Compression fracture of L1 vertebra, initial encounter ([place]) [S32.010A] ?Compression fracture of L2 vertebra, initial encounter ([place]) [U44.034V] ?Hypertension essential hypertension ?Hyperlipidemia ?Hyperglycemia ? ?Consultants ?Orthopedic surgery - Dr. Marcelino Scot ?Neurosurgery - Dr. Ronnald Ramp ? ?Procedures ?Dr. Marcelino Scot 05/28/21 ?OPEN REDUCTION INTERNAL FIXATION OF LEFT ANKLE PILON FRACTURE, TIBIA ONLY ?OPEN REDUCTION INTERNAL FIXATION OF THE SYNDESMOSIS ?MANUAL APPLICATION OF STRESS TO ANKLE SYNDESMOSIS UNDER FLUOROSCOPY ? ?HPI: Devin Roberts is an 61 y.o. male who is here for evaluation after a workplace injury.  They were in the process of moving pallets when he was struck from above on his right shoulder by a piece of the crane and he fell between 2 wooden pallets.  He had obvious injury to his left leg.  He had no loss of consciousness.  He was evaluated by the emergency  department and found to have a scapula fracture, L2 vertebral body fracture, and multiple fractures in his left lower extremity and trauma was called to admit the patient. ? ?He denies any head pain or neck pain.  He denies any left upper extremity pain.  He denies any abdominal pain.  He denies any right lower extremity pain.  He does have some minor discomfort in his back.  He denies any numbness or tingling in his legs.  He had a dislocation around his left ankle which has been reduced by orthopedic surgery. ? ?He takes medicine for hypertension and hypercholesterolemia. ? ?He reports that he thought he was developing a head cold over the weekend so he went to urgent care and was given an injection and put on a Z-Pak.  He states that he has been feeling relatively fine the past few days.  He reports normal oral and liquid intake. ?  ? ?Hospital Course:  ? ?Following his workplace fall/injury patient was admitted to the trauma service for the further evaluation and treatment of injuries as below: ? ?Right scapula fracture-orthopedic surgery was consulted with recommendations for sling for comfort and patient cleared to weight-bear through his right upper extremity.  He will follow-up with orthopedic surgery outpatient ? ?L2 vertebral body fracture/L1 inferior endplate fracture-seen on initial imaging.  Neurosurgery was consulted, Dr. Ronnald Ramp, with recommendations for TLSO brace when out of bed.  Follow-up with neurosurgery outpatient ? ?Left tibia-fibula fracture and left malleolus fracture - he underwent ORIF on 3/6 with Dr. Marcelino Scot.  He is nonweightbearing on his left lower extremity for 8 weeks and should follow-up with orthopedic surgery in the next 2 weeks.  He will need to continue DVT prophylaxis with Lovenox for 21 days and vitamin D supplementation  ? ?AKI -present on admission and monitored.  His creatinine improved and AKI have resolved ? ?Hyperglycemia -his A1c was 6.5.  Sliding  scale insulin available  during admission. ? ?He has hypertension and hyperlipidemia at baseline and his home meds were ordered ? ?On date of discharge patient had appropriately progressed with therapies and met criteria for safe discharge to acute inpatient rehab with the support of his wife. ? ?I discussed discharge instructions with patient as well as return precautions and all questions and concerns were addressed.  ? ?Patient agrees to follow up as below. ? ?Allergies as of 05/30/2021   ? ?   Reactions  ? Codeine Itching  ? Lisinopril   ? REACTION: Allergic to ACE inhibitors w/ cough  ? Simvastatin   ? REACTION: pt states ZOCOR caused leg cramps  ? ?  ? ? ? Follow-up Information   ? ? Altamese Shiloh, MD. Call.   ?Specialty: Orthopedic Surgery ?Why: call to confirm follow up appointment for 2 weeks ?Contact information: ?[place] [address] ?[phone] ? ? ?  ?  ? ? Eustace Moore, MD. Call.   ?Specialty: Neurosurgery ?Why: call to confirm follow up appointment for lumbar spine fractures ?Contact information: ?[address] ?[address] ?[place] [address] ?[phone] ? ? ?  ?  ? ? [place]. Call.   ?Why: follow up with [place] is not neccessary but please call with any questions or concerns ?Contact information: ?[address] ?[address] ?[place] [address] ?[phone] ? ?  ?  ? ?  ?  ? ?  ? ? ?Signed: ?Winferd Humphrey , PA-C ?[facility] ?05/30/2021, 10:52 AM ?Please see Amion for pager number during day hours 7:00am-4:30pm ? ?

## 2021-05-30 NOTE — Progress Notes (Signed)
PMR Admission Coordinator Pre-Admission Assessment   Patient: Devin Roberts is an 61 y.o., male MRN: [id number] DOB: 26-Feb-1961 Height: _0  (172.7 cm) Weight: 100.7 kg   Insurance Information HMO:     PPO:      PCP:      IPA:      80/20:      OTHER:  PRIMARY:   [employer]'s [employer]     Policy#: [id number]      Subscriber: Pt  CM Name: Katherina Right      Phone#: [phone]     Fax#: [phone]  Rehab CM Katherina Right with [employer] called and confirmed that pt's rehab stay is approved and requested that initial evals be sent,as well as weekly progress notes Pre-Cert#: [id number]      Employer:  Benefits:  Phone #:      Name:  Eff. Date: active      Deduct:       Out of Pocket Max:       Life Max:  CIR:       SNF:  Outpatient:      Co-Pay:  Home Health:       Co-Pay:  DME:      Co-Pay:  Providers:  SECONDARY: [employer]      Policy#: [id number]     Phone#:    Financial Counselor:       Phone#:    The Data Collection Information Summary for patients in Inpatient Rehabilitation Facilities with attached Privacy Act [place] Records was provided and verbally reviewed with: Patient    Emergency Contact Information Contact Information       Name Relation Home Work [place] Spouse [phone]           Current Medical History  Patient Admitting Diagnosis: Polytrauma History of Present Illness: Devin Roberts is an 61 y.o. male who is here for evaluation after a workplace injury 05/25/21.  They were in the process of moving pallets when he was struck from above on his right shoulder by a piece of the crane and he fell between 2 wooden pallets.  He had obvious injury to his left leg.  He had no loss of consciousness.  He was evaluated by the emergency department and found to have a scapula fracture, L2 vertebral body fracture, and multiple fractures in his left lower extremity and trauma was called to admit the patient. Pt. Wth Pt  with L1-2 fx, complex LLE fx,  Rt scapula fx. RUE in a sling for comfort, can weight bear on RUE per ortho. Pt. To wear TLSO brace when OOB for L2 fracture. Pt. Underwent ORIF of L tib-fib and L malleolus fx on 05/29/19. He is NWB on LLE x 8 wks. CIR was consulted to assist return to PLOF.    Patient's medical record from [facility] has been reviewed by the rehabilitation admission coordinator and physician.   Past Medical History      Past Medical History:  Diagnosis Date   Acute asthmatic bronchitis     Allergic rhinitis     Anxiety     Chronic insomnia     Diabetes mellitus     DJD (degenerative joint disease)     GERD (gastroesophageal reflux disease)     Headache(784.0)     Hypercholesterolemia     Hypertension     Obesity        Has the patient had major surgery during 100 days prior  to admission? Yes   Family History   family history includes COPD in his father; Hyperlipidemia in his mother; Lung cancer in his father.   Current Medications   Current Facility-Administered Medications:    0.9 %  sodium chloride infusion, , Intravenous, Continuous, Meuth, Brooke A, PA-C, Last Rate: 10 mL/hr at 05/29/21 0401, Infusion Verify at 05/29/21 0401   acetaminophen (TYLENOL) tablet 1,000 mg, 1,000 mg, Oral, Q6H, Lovick, Ayesha N, MD, 1,000 mg at 05/29/21 1153   ascorbic acid (VITAMIN C) tablet 1,000 mg, 1,000 mg, Oral, Daily, Ainsley Spinner, PA-C, 1,000 mg at 05/29/21 1028   bisacodyl (DULCOLAX) suppository 10 mg, 10 mg, Rectal, Daily PRN, Kabrich, Martha H, PA-C   Chlorhexidine Gluconate Cloth 2 % PADS 6 each, 6 each, Topical, Daily, Stechschulte, Nickola Major, MD, 6 each at 05/29/21 8309   chlorthalidone (HYGROTON) tablet 25 mg, 25 mg, Oral, Daily, Ainsley Spinner, PA-C, 25 mg at 05/29/21 4076   cholecalciferol (VITAMIN D3) tablet 2,000 Units, 2,000 Units, Oral, BID, Ainsley Spinner, PA-C, 2,000 Units at 05/29/21 1029   dextromethorphan-guaiFENesin ([place] DM) 30-600 MG per 12 hr  tablet 1 tablet, 1 tablet, Oral, BID PRN, Ainsley Spinner, PA-C, 1 tablet at 05/28/21 1301   diphenhydrAMINE (BENADRYL) capsule 25 mg, 25 mg, Oral, Q6H PRN, Ainsley Spinner, PA-C, 25 mg at 05/29/21 0831   docusate sodium (COLACE) capsule 100 mg, 100 mg, Oral, BID, Greer Pickerel, MD, 100 mg at 05/29/21 0827   enoxaparin (LOVENOX) injection 40 mg, 40 mg, Subcutaneous, Q12H, Lovick, Montel Culver, MD   insulin aspart (novoLOG) injection 0-15 Units, 0-15 Units, Subcutaneous, TID WC, Ainsley Spinner, PA-C, 2 Units at 05/29/21 1155   irbesartan (AVAPRO) tablet 300 mg, 300 mg, Oral, Daily, Ainsley Spinner, PA-C, 300 mg at 05/29/21 0827   ketorolac (TORADOL) 15 MG/ML injection 15 mg, 15 mg, Intravenous, Q6H, Lovick, Montel Culver, MD, 15 mg at 05/29/21 1153   methocarbamol (ROBAXIN) tablet 750 mg, 750 mg, Oral, QID, Lovick, Montel Culver, MD   morphine (PF) 2 MG/ML injection 2-4 mg, 2-4 mg, Intravenous, Q3H PRN, Ainsley Spinner, PA-C, 2 mg at 05/26/21 1425   mupirocin ointment (BACTROBAN) 2 % 1 application, 1 application., Nasal, BID, Stechschulte, Nickola Major, MD, 1 application. at 05/29/21 0832   ondansetron (ZOFRAN-ODT) disintegrating tablet 4 mg, 4 mg, Oral, Q6H PRN **OR** ondansetron (ZOFRAN) injection 4 mg, 4 mg, Intravenous, Q6H PRN, Greer Pickerel, MD   oxyCODONE (Oxy IR/ROXICODONE) immediate release tablet 10 mg, 10 mg, Oral, Q4H PRN, Greer Pickerel, MD, 10 mg at 05/29/21 1028   oxyCODONE (Oxy IR/ROXICODONE) immediate release tablet 5 mg, 5 mg, Oral, Q4H PRN, Greer Pickerel, MD, 5 mg at 05/28/21 1731   polyethylene glycol (MIRALAX / GLYCOLAX) packet 17 g, 17 g, Oral, Daily, Meuth, Brooke A, PA-C, 17 g at 05/29/21 0826   zolpidem (AMBIEN) tablet 5 mg, 5 mg, Oral, QHS PRN, Ainsley Spinner, PA-C, 5 mg at 05/28/21 2150   Patients Current Diet:  Diet Order                  Diet Carb Modified Fluid consistency: Thin; Room service appropriate? Yes  Diet effective now                         Precautions / Restrictions Precautions Precautions:  Back, Fall Precaution Comments: sling RUE Spinal Brace: Thoracolumbosacral orthotic, Applied in supine position Restrictions Weight Bearing Restrictions: Yes RUE Weight Bearing: Weight bearing as tolerated LLE Weight Bearing: Non weight  bearing Other Position/Activity Restrictions: pt was instructed to minimize RUE wgt    Has the patient had 2 or more falls or a fall with injury in the past year? No   Prior Activity Level Community (5-7x/wk): Pt. working and active in the community PTA   Prior Functional Level Self Care: Did the patient need help bathing, dressing, using the toilet or eating? Independent   Indoor Mobility: Did the patient need assistance with walking from room to room (with or without device)? Independent   Stairs: Did the patient need assistance with internal or external stairs (with or without device)? Independent   Functional Cognition: Did the patient need help planning regular tasks such as shopping or remembering to take medications? Independent   Patient Information Are you of Hispanic, Latino/a,or Spanish origin?: A. No, not of Hispanic, Latino/a, or Spanish origin What is your race?: A. White Do you need or want an interpreter to communicate with a doctor or health care staff?: 0. No   Patient's Response To:  Health Literacy and Transportation Is the patient able to respond to health literacy and transportation needs?: Yes Health Literacy - How often do you need to have someone help you when you read instructions, pamphlets, or other written material from your doctor or pharmacy?: Sometimes In the past 12 months, has lack of transportation kept you from medical appointments or from getting medications?: No In the past 12 months, has lack of transportation kept you from meetings, work, or from getting things needed for daily living?: No   Development worker, international aid / [place] Devices/Equipment: None Home Equipment: None   Prior Device Use:  Indicate devices/aids used by the patient prior to current illness, exacerbation or injury? None of the above   Current Functional Level Cognition   Overall Cognitive Status: Within Functional Limits for tasks assessed Orientation Level: Oriented X4    Extremity Assessment (includes Sensation/Coordination)   Upper Extremity Assessment: RUE deficits/detail RUE Deficits / Details: sling and educated on positioning of the sling. pt educated on management of sling with L UE  Lower Extremity Assessment: Defer to PT evaluation LLE Deficits / Details: immobilized with splint and able to maintain NWB, WFL for hip flexion     ADLs   Overall ADL's : Needs assistance/impaired Eating/Feeding: Set up, Sitting Grooming: Set up, Sitting Upper Body Bathing: Moderate assistance, Sitting Lower Body Bathing: Moderate assistance, Bed level Lower Body Bathing Details (indicate cue type and reason): pt is able to figure 4 cross L LE and use L UE Upper Body Dressing : Total assistance Upper Body Dressing Details (indicate cue type and reason): don TLSO and sling supine Lower Body Dressing: Moderate assistance, Bed level Lower Body Dressing Details (indicate cue type and reason): pt able to don sock and doff sock on LLE in supine. pt attempting to use L UE as much as possible Toilet Transfer: +2 for physical assistance, Moderate assistance, Stand-pivot Toilet Transfer Details (indicate cue type and reason): pivot toward the R side General ADL Comments: pt progressed from bed to chair this session and could use BSC with staff     Mobility   Overal bed mobility: Needs Assistance Bed Mobility: Rolling, Sidelying to Sit Rolling: Min guard Sidelying to sit: Min assist Sit to supine: Mod assist, +2 for physical assistance General bed mobility comments: min assist to manage back precautions     Transfers   Overall transfer level: Needs assistance Equipment used: 1 person hand held assist Transfers: Sit  to/from  Stand, Bed to chair/wheelchair/BSC Sit to Stand: Min assist Bed to/from chair/wheelchair/BSC transfer type:: Stand pivot Stand pivot transfers: Min assist General transfer comment: pt with LLE placed on PT foot to ensure NWB. pt able to achieve full upright posture. Pt able to pivot on R ankle to chair and (A) to lower to surface. Will need reinforcement and practice to keep weight fully off of LLE     Ambulation / Gait / Stairs / Wheelchair Mobility   Ambulation/Gait General Gait Details: unable     Posture / Balance Dynamic Sitting Balance Sitting balance - Comments: EOB, able to balance with no help an dmaintain NWB on LLE Balance Overall balance assessment: Needs assistance Sitting balance-Leahy Scale: Good Sitting balance - Comments: EOB, able to balance with no help an dmaintain NWB on LLE Postural control: Other (comment) (upright with TLSO) Standing balance support: Single extremity supported, During functional activity Standing balance-Leahy Scale: Poor Standing balance comment: fair with UE support on LUE     Special needs/care consideration Skin surgical incisions and Special service needs TSLO brace when OOB, Sling for RUE    Previous Home Environment (from acute therapy documentation) Living Arrangements: Spouse/significant other  Lives With: Spouse Available Help at Discharge: Family, Available 24 hours/day Type of Home: House Home Layout: Two level, Able to live on main level with bedroom/bathroom Home Access: Stairs to enter Entrance Stairs-Number of Steps: 2 Bathroom Shower/Tub: Multimedia programmer: Standard Bathroom Accessibility: Yes How Accessible: Accessible via walker Home Care Services: No Additional Comments: wife measure the bathroom distances and due to a pony wall in the bathroom between the toilet and the shower pt can not access the toilet via wheelchair. Pt will likely need BSC. p thas adustable bed at home. Pt has doorway if the  doors are removed the w/c can pass the door frame with w/c   Discharge Living Setting Plans for Discharge Living Setting: Patient's home Type of Home at Discharge: House Discharge Home Layout: One level Discharge Home Access: Stairs to enter Entrance Stairs-Rails: None Entrance Stairs-Number of Steps: 2 Discharge Bathroom Shower/Tub: Walk-in shower Discharge Bathroom Toilet: Standard Discharge Bathroom Accessibility: No Does the patient have any problems obtaining your medications?: No   Social/Family/Support Systems Patient Roles: Spouse Contact Information: [phone] Anticipated Caregiver: Kerrion Kemppainen Anticipated Caregiver's Contact Information: [phone] Ability/Limitations of Caregiver: Can provid Min A (Will have to return to work in 1 week, other family may be able to assist) Caregiver Availability: 24/7 Discharge Plan Discussed with Primary Caregiver: Yes Is Caregiver In Agreement with Plan?: Yes Does Caregiver/Family have Issues with Lodging/Transportation while Pt is in Rehab?: Yes   Goals Patient/Family Goal for Rehab: PT/OT Mod I Expected length of stay: 14-16 days Pt/Family Agrees to Admission and willing to participate: Yes Program Orientation Provided & Reviewed with Pt/Caregiver Including Roles  & Responsibilities: Yes   Decrease burden of Care through IP rehab admission: Specialzed equipment needs and Patient/family education   Possible need for SNF placement upon discharge: not anticipated    Patient Condition: I have reviewed medical records from [facility], spoken with CM, and patient and spouse. I met with patient at the bedside for inpatient rehabilitation assessment.  Patient will benefit from ongoing PT, OT, and SLP, can actively participate in 3 hours of therapy a day 5 days of the week, and can make measurable gains during the admission.  Patient will also benefit from the coordinated team approach during an Inpatient Acute  Rehabilitation admission.  The  patient will receive intensive therapy as well as Rehabilitation physician, nursing, social worker, and care management interventions.  Due to safety, skin/wound care, medication administration, pain management, and patient education the patient requires 24 hour a day rehabilitation nursing.  The patient is currently min A with mobility and basic ADLs.  Discharge setting and therapy post discharge at home with home health is anticipated.  Patient has agreed to participate in the Acute Inpatient Rehabilitation Program and will admit today.   Preadmission Screen Completed By:  Genella Mech, 05/29/2021 1:54 PM ______________________________________________________________________   Discussed status with Dr. Ranell Patrick  on 05/30/21 at 1000 and received approval for admission today.   Admission Coordinator:  Genella Mech, CCC-SLP, time 1000/Date 05/30/21    Assessment/Plan: Diagnosis: Polytrauma Does the need for close, 24 hr/day Medical supervision in concert with the patient's rehab needs make it unreasonable for this patient to be served in a less intensive setting? Yes Co-Morbidities requiring supervision/potential complications: obesity, HLD, HTN, allergic rhinitis, GERD Due to bladder management, bowel management, safety, skin/wound care, disease management, medication administration, pain management, and patient education, does the patient require 24 hr/day rehab nursing? Yes Does the patient require coordinated care of a physician, rehab nurse, PT, OT to address physical and functional deficits in the context of the above medical diagnosis(es)? Yes Addressing deficits in the following areas: balance, endurance, locomotion, strength, transferring, bowel/bladder control, bathing, dressing, feeding, grooming, toileting, and psychosocial support Can the patient actively participate in an intensive therapy program of at least 3 hrs of therapy 5 days a week? Yes The potential  for patient to make measurable gains while on inpatient rehab is excellent Anticipated functional outcomes upon discharge from inpatient rehab: modified independent PT, modified independent OT, independent SLP Estimated rehab length of stay to reach the above functional goals is: 10-14 days Anticipated discharge destination: Home 10. Overall Rehab/Functional Prognosis: excellent     MD Signature: Leeroy Cha, MD

## 2021-05-30 NOTE — Progress Notes (Signed)
? ?Progress Note ? ?2 Days Post-Op  ?Subjective: ?Worked well with therapies yesterday and pain well controlled. No new complaints. No BM since PTA. No nausea or emesis ? ?Objective: ?Vital signs in last 24 hours: ?Temp:  [98.3 ?F (36.8 ?C)-98.7 ?F (37.1 ?C)] 98.3 ?F (36.8 ?C) (03/08 0455) ?Pulse Rate:  [61-76] 61 (03/08 0455) ?Resp:  [17-18] 17 (03/08 0455) ?BP: (119-160)/(57-83) 137/83 (03/08 0455) ?SpO2:  [95 %-98 %] 97 % (03/08 0455) ?Last BM Date : 05/25/21 ? ?Intake/Output from previous day: ?03/07 0701 - 03/08 0700 ?In: 429.8 [P.O.:240; I.V.:189.8] ?Out: 850 [Urine:850] ?Intake/Output this shift: ?No intake/output data recorded. ? ?PE: ?General: alert, NAD ?HEENT: head is normocephalic, atraumatic. Mouth is pink and moist ?Heart: Palpable radial pulses bilaterally ?Lungs:  Respiratory effort nonlabored on room air ?Abd: soft, NT, ND, +BS, no masses, hernias, or organomegaly ?MS: RUE NVI; LLE in splint, L toes WWP and NVI ?Skin: warm and dry ?Psych: A&Ox3 with an appropriate affect.  ?  ? ? ?Lab Results:  ?Recent Labs  ?  05/28/21 ?0117 05/29/21 ?0130  ?WBC 10.6* 14.0*  ?HGB 13.0 12.1*  ?HCT 37.1* 34.5*  ?PLT 181 189  ? ? ?BMET ?Recent Labs  ?  05/28/21 ?0117  ?NA 139  ?K 3.8  ?CL 104  ?CO2 29  ?GLUCOSE 143*  ?BUN 18  ?CREATININE 1.00  ?CALCIUM 8.9  ? ? ?PT/INR ?No results for input(s): LABPROT, INR in the last 72 hours. ?CMP  ?   ?Component Value Date/Time  ? NA 139 05/28/2021 0117  ? NA 129 (A) 03/29/2015 0000  ? K 3.8 05/28/2021 0117  ? CL 104 05/28/2021 0117  ? CO2 29 05/28/2021 0117  ? GLUCOSE 143 (H) 05/28/2021 0117  ? BUN 18 05/28/2021 0117  ? BUN 26 (A) 03/29/2015 0000  ? CREATININE 1.00 05/28/2021 0117  ? CALCIUM 8.9 05/28/2021 0117  ? PROT 6.1 (L) 05/28/2021 0117  ? ALBUMIN 3.1 (L) 05/28/2021 0117  ? AST 30 05/28/2021 0117  ? ALT 23 05/28/2021 0117  ? ALKPHOS 37 (L) 05/28/2021 0117  ? BILITOT 0.8 05/28/2021 0117  ? GFRNONAA >60 05/28/2021 0117  ? GFRAA 53 (L) 01/25/2017 1834  ? ?Lipase  ?No results  found for: LIPASE ? ? ? ? ?Studies/Results: ?DG Lumbar Spine 2-3 Views ? ?Result Date: 05/29/2021 ?CLINICAL DATA:  L2 vertebral fracture EXAM: LUMBAR SPINE - 2-3 VIEW COMPARISON:  05/25/2021 CT chest, abdomen and pelvis FINDINGS: This report assumes 5 non rib-bearing lumbar vertebrae. Acute anterior inferior L1 vertebral fracture without significant loss of vertebral body height, with small displaced 0.8 cm fracture fragment anterior to the lower L1 vertebral body. Acute comminuted L2 vertebral body fracture with approximately 10-20% loss of vertebral body height, without definite fracture extension to the posterior margin of the L2 vertebral body. Mild multilevel lumbar degenerative disc disease. Mild 3 mm retrolisthesis at L1-2 and L2-3. No significant facet arthropathy. No aggressive appearing focal osseous lesions. IMPRESSION: 1. Acute anterior inferior L1 vertebral fracture without significant loss of vertebral body height. 2. Acute comminuted L2 vertebral body fracture with approximately 10-20% loss of vertebral body height. 3. Mild multilevel lumbar degenerative disc disease. Mild multilevel lumbar spondylolisthesis as detailed. Electronically Signed   By: Delbert Phenix M.D.   On: 05/29/2021 11:08  ? ?DG Tibia/Fibula Left ? ?Result Date: 05/28/2021 ?CLINICAL DATA:  Fluoroscopic assistance for reduction and internal fixation of fracture of distal tibia EXAM: LEFT TIBIA AND FIBULA - 2 VIEW COMPARISON:  05/25/2021 FINDINGS: Fluoroscopic images show  reduction and internal fixation of comminuted fracture of distal radius. There are 2 surgical screws traversing distal fibula and tibia suggesting internal fixation of inferior tibiofibular joint. Fluoroscopic time was 58 seconds. Radiation dose is 2.25 mGy. IMPRESSION: Fluoroscopic assistance was provided for reduction and internal fixation of fracture of distal tibia. Electronically Signed   By: Ernie Avena M.D.   On: 05/28/2021 10:55  ? ?DG Ankle Complete  Left ? ?Result Date: 05/28/2021 ?CLINICAL DATA:  Postoperative exam.  Left ankle fracture. EXAM: LEFT ANKLE COMPLETE - 3+ VIEW COMPARISON:  CT left ankle 05/25/2021, left ankle radiographs 05/25/2021 FINDINGS: There is overlying casting material. New medial plate and screw fixation of the distal tibia. Additional anterior approach distal tibial metadiaphyseal screw. New distal fibular lateral plate and screw fixation hardware that also secures the distal tibiofibular syndesmosis. Marked improvement in prior displaced distal tibial metadiaphyseal fracture. Minimally displaced posterior malleolar fracture is again noted. The ankle mortise is symmetric and intact. Minimal distal medial malleolar degenerative osteophytes. There is partial visualization of a likely fracture of the mid left fibular diaphysis with mild lateral apex angulation. IMPRESSION:: IMPRESSION: 1. Interval ORIF of the distal tibia and distal tibiofibular syndesmosis with improved now anatomic alignment. 2. Partial visualization of a likely acute fracture of the mid left fibular diaphysis with mild lateral apex angulation. Electronically Signed   By: Neita Garnet M.D.   On: 05/28/2021 12:27  ? ?DG C-Arm 1-60 Min-No Report ? ?Result Date: 05/28/2021 ?Fluoroscopy was utilized by the requesting physician.  No radiographic interpretation.  ? ?DG C-Arm 1-60 Min-No Report ? ?Result Date: 05/28/2021 ?Fluoroscopy was utilized by the requesting physician.  No radiographic interpretation.   ? ?Anti-infectives: ?Anti-infectives (From admission, onward)  ? ? Start     Dose/Rate Route Frequency Ordered Stop  ? 05/28/21 1700  ceFAZolin (ANCEF) IVPB 2g/100 mL premix       ? 2 g ?200 mL/hr over 30 Minutes Intravenous Every 8 hours 05/28/21 1233 05/29/21 0926  ? 05/28/21 0700  ceFAZolin (ANCEF) IVPB 2g/100 mL premix       ? 2 g ?200 mL/hr over 30 Minutes Intravenous On call to O.R. 05/27/21 1230 05/28/21 0920  ? ?  ? ? ? ?Assessment/Plan ?Workplace fall/injury ?R scapula  fx - per ortho, sling for comfort, ok to WB thru RUE ?L2 vertebral body fx/L1 inf endplate fx  - per NS, TLSO, repeat x rays pending per NS ?L tib-fib and L malleolus fx - s/p ORIF 3/6 Dr. Carola Frost. NWB LLE x 8wks ?AKI - Cr improved ?Hyperglycemia - SSI, A1c 6.5 ?HTN - home meds ?HLD - home meds ?  ?FEN: CM diet, KVO IVF. Cont scheduled miralax, prn suppository ?VTE: SCDs, LMWH x 21 days per ortho ?ID: ancef periop ?  ?Dispo: PT/OT - recommending CIR which patient and wife are interested in. CBC pending this am ? ? LOS: 5 days  ? ? ?Eric Form, PA-C ?[facility] ?05/30/2021, 7:37 AM ?Please see Amion for pager number during day hours 7:00am-4:30pm ? ?

## 2021-05-30 NOTE — Progress Notes (Signed)
Patient discharged to CIR as ordered, all belongings taken and wife was present during transfer.Reported to nurse Sabino Gasser. ?

## 2021-05-31 DIAGNOSIS — T07XXXA Unspecified multiple injuries, initial encounter: Secondary | ICD-10-CM | POA: Diagnosis not present

## 2021-05-31 LAB — COMPREHENSIVE METABOLIC PANEL
ALT: 16 U/L (ref 0–44)
AST: 19 U/L (ref 15–41)
Albumin: 3 g/dL — ABNORMAL LOW (ref 3.5–5.0)
Alkaline Phosphatase: 40 U/L (ref 38–126)
Anion gap: 8 (ref 5–15)
BUN: 22 mg/dL — ABNORMAL HIGH (ref 6–20)
CO2: 27 mmol/L (ref 22–32)
Calcium: 9 mg/dL (ref 8.9–10.3)
Chloride: 101 mmol/L (ref 98–111)
Creatinine, Ser: 0.9 mg/dL (ref 0.61–1.24)
GFR, Estimated: >60 mL/min (ref >60)
Glucose, Bld: 121 mg/dL — ABNORMAL HIGH (ref 70–99)
Potassium: 4.2 mmol/L (ref 3.5–5.1)
Sodium: 136 mmol/L (ref 135–145)
Total Bilirubin: 1 mg/dL (ref 0.3–1.2)
Total Protein: 6.1 g/dL — ABNORMAL LOW (ref 6.5–8.1)

## 2021-05-31 LAB — URINALYSIS, ROUTINE W REFLEX MICROSCOPIC
Bilirubin Urine: NEGATIVE
Glucose, UA: NEGATIVE mg/dL
Hgb urine dipstick: NEGATIVE
Ketones, ur: NEGATIVE mg/dL
Leukocytes,Ua: NEGATIVE
Nitrite: NEGATIVE
Protein, ur: NEGATIVE mg/dL
Specific Gravity, Urine: 1.025 (ref 1.005–1.030)
pH: 6 (ref 5.0–8.0)

## 2021-05-31 LAB — CBC WITH DIFFERENTIAL/PLATELET
Abs Immature Granulocytes: 0.1 10*3/uL — ABNORMAL HIGH (ref 0.00–0.07)
Basophils Absolute: 0.1 10*3/uL (ref 0.0–0.1)
Basophils Relative: 1 %
Eosinophils Absolute: 0.2 10*3/uL (ref 0.0–0.5)
Eosinophils Relative: 2 %
HCT: 35.6 % — ABNORMAL LOW (ref 39.0–52.0)
Hemoglobin: 12.8 g/dL — ABNORMAL LOW (ref 13.0–17.0)
Immature Granulocytes: 1 %
Lymphocytes Relative: 12 %
Lymphs Abs: 1.7 10*3/uL (ref 0.7–4.0)
MCH: 33.7 pg (ref 26.0–34.0)
MCHC: 36 g/dL (ref 30.0–36.0)
MCV: 93.7 fL (ref 80.0–100.0)
Monocytes Absolute: 1.2 10*3/uL — ABNORMAL HIGH (ref 0.1–1.0)
Monocytes Relative: 8 %
Neutro Abs: 10.7 10*3/uL — ABNORMAL HIGH (ref 1.7–7.7)
Neutrophils Relative %: 76 %
Platelets: 227 10*3/uL (ref 150–400)
RBC: 3.8 MIL/uL — ABNORMAL LOW (ref 4.22–5.81)
RDW: 12.4 % (ref 11.5–15.5)
WBC: 13.9 10*3/uL — ABNORMAL HIGH (ref 4.0–10.5)
nRBC: 0 % (ref 0.0–0.2)

## 2021-05-31 LAB — GLUCOSE, CAPILLARY
Glucose-Capillary: 126 mg/dL — ABNORMAL HIGH (ref 70–99)
Glucose-Capillary: 138 mg/dL — ABNORMAL HIGH (ref 70–99)
Glucose-Capillary: 133 mg/dL — ABNORMAL HIGH (ref 70–99)
Glucose-Capillary: 138 mg/dL — ABNORMAL HIGH (ref 70–99)

## 2021-05-31 MED ORDER — SORBITOL 70 % SOLN
45.0000 mL | Freq: Once | Status: DC | PRN
Start: 2021-05-31 — End: 2021-06-07

## 2021-05-31 MED ORDER — SORBITOL 70 % SOLN
45.0000 mL | Freq: Once | Status: AC
Start: 2021-05-31 — End: 2021-05-31
  Administered 2021-05-31: 12:00:00 45 mL via ORAL
  Filled 2021-05-31: qty 60

## 2021-05-31 MED ORDER — DIPHENHYDRAMINE HCL 25 MG PO CAPS
25.0000 mg | ORAL_CAPSULE | Freq: Four times a day (QID) | ORAL | Status: DC | PRN
  Administered 2021-05-31 – 2021-06-06 (×7): 25 mg via ORAL
  Filled 2021-05-31 (×7): qty 1

## 2021-05-31 NOTE — Evaluation (Signed)
Physical Therapy Assessment and Plan  Patient Details  Name: Devin Roberts MRN: [id number] Date of Birth: [birth year]  PT Diagnosis: Abnormality of gait, Low back pain, Muscle weakness, and Pain in joint Rehab Potential: Good ELOS: 10-12 days   Today's Date: 05/31/2021 PT Individual Time: 1000-1115 PT Individual Time Calculation (min): 75 min    Hospital Problem: Principal Problem:   Critical polytrauma   Past Medical History:  Past Medical History:  Diagnosis Date   Acute asthmatic bronchitis    Allergic rhinitis    Anxiety    Chronic insomnia    Diabetes mellitus    DJD (degenerative joint disease)    GERD (gastroesophageal reflux disease)    Headache(784.0)    Hypercholesterolemia    Hypertension    Obesity    Past Surgical History:  Past Surgical History:  Procedure Laterality Date   ORIF ANKLE FRACTURE Left 05/28/2021   Procedure: OPEN REDUCTION INTERNAL FIXATION (ORIF) TIBIA/FIBULA FRACTURE;  Surgeon: Altamese Jansen, MD;  Location: [place];  Service: Orthopedics;  Laterality: Left;   SHOULDER SURGERY      Assessment & Plan Clinical Impression: HPI: Devin Roberts is a 61 year old male who sustained an injury while working.  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  He was brought to [facility] emergency department via [facility].  He was alert and oriented x4 and hemodynamically stable.  Imaging revealed left tibia/fibular shaft fracture and underwent ORIF repair by Dr. Marcelino Scot.  He also sustained a right scapular body fracture that did not require operative treatment.  He was initially nonweightbearing and sling.  CT scan revealed a vertical fracture through the L1 vertebral body and a mild compression fracture of L2.  Neurosurgery (Dr. Sherley Bounds) was consulted and he was fitted for TLSO brace.  Laboratory work-up revealed elevated serum creatinine  consistent with acute kidney injury.  This normalized quickly and has been normal in the last 3 days.  Elevated white blood cell count to 14,000 without fever or signs of infection.   Orthopedic recommendations as follows: Continue Lovenox for approximately 21 days.  This would take him through 3/27.  He can use sling for comfort for right upper extremity and can use right upper extremity to help mobilize.  May weight-bear through right upper extremity.  He is nonweightbearing of left lower extremity for 8 weeks.  He will continue his splint for 2 weeks then can convert cam boot at that time.  Recommend suture removal around 3/20.  Unrestricted left knee and hip range of motion. TLSO when out of bed. The patient requires inpatient medicine and rehabilitation evaluations and services for ongoing dysfunction secondary to polytrauma.   Patient's past medical history is significant for hypertension, hyperlipidemia.  During hospitalization he has had elevated glucoses and his A1c is 6.5. He currently complains of right shoulder pain.POD 2 ORIF left pilon fracture and syndesmosis, left proximal tibia fracture             --NWB times 8 weeks             --splint times 2 weeks, then CAM boot (3/20)             --unrestricted left knee and hip ROM 9: right scapular fracture: non-operative repair             --sling for comfort             --  early ROM             --can use RUE to help mobilize             --may weight bear through RUE 10: L1, L2 vertebral body compression fractures             --continue TSLO when out of bed Patient transferred to CIR on 05/30/2021 .   Patient currently requires min with mobility secondary to muscle weakness and muscle joint tightness, decreased cardiorespiratoy endurance, and decreased standing balance and decreased postural control.  Prior to hospitalization, patient was independent  with mobility and lived with Spouse in a House home.  Home access is 2Stairs to  enter.  Patient will benefit from skilled PT intervention to maximize safe functional mobility, minimize fall risk, and decrease caregiver burden for planned discharge home with 24 hour assist.  Anticipate patient will benefit from follow up OP at discharge.  PT - End of Session Activity Tolerance: Tolerates 10 - 20 min activity with multiple rests Endurance Deficit: Yes Endurance Deficit Description: Quick to fatigue PT Assessment Rehab Potential (ACUTE/IP ONLY): Good PT Barriers to Discharge: Inaccessible home environment;Weight bearing restrictions PT Barriers to Discharge Comments: equipment needs PT Patient demonstrates impairments in the following area(s): Balance;Endurance;Motor;Pain;Safety PT Transfers Functional Problem(s): Bed Mobility;Bed to Chair;Car;Furniture PT Locomotion Functional Problem(s): Ambulation;Wheelchair Mobility PT Plan PT Intensity: Minimum of 1-2 x/day ,45 to 90 minutes PT Duration Estimated Length of Stay: 10-12 days PT Treatment/Interventions: Ambulation/gait training;DME/adaptive equipment instruction;Psychosocial support;UE/LE Strength taining/ROM;Wheelchair propulsion/positioning;Balance/vestibular training;Discharge planning;Pain management;Therapeutic Activities;UE/LE Coordination activities;Disease management/prevention;Functional mobility training;Patient/family education;Therapeutic Exercise PT Transfers Anticipated Outcome(s): mod I PT Locomotion Anticipated Outcome(s): mod I household PT Recommendation Recommendations for Other Services: Therapeutic Recreation consult Therapeutic Recreation Interventions: Stress management;Outing/community reintergration Follow Up Recommendations: Outpatient PT Patient destination: Home Equipment Recommended: To be determined Equipment Details: TBD, will need ramp   PT Evaluation Precautions/Restrictions Precautions Precautions: Back;Fall Required Braces or Orthoses: Spinal Brace;Sling Spinal Brace:  Thoracolumbosacral orthotic;Applied in supine position Restrictions Weight Bearing Restrictions: Yes RUE Weight Bearing: Weight bearing as tolerated LLE Weight Bearing: Non weight bearing Other Position/Activity Restrictions: LE ROM to tolerance General   Vital SignsTherapy Vitals Temp: 98 F (36.7 C) Temp Source: Oral Pulse Rate: 79 Resp: 18 BP: 115/65 Patient Position (if appropriate): Lying Oxygen Therapy SpO2: 95 % O2 Device: Room Air Pain Pain Assessment Pain Scale: 0-10 Pain Score: 5  Pain Location:  (back, shoulder) Pain Orientation: Right;Medial (R shoulder, medial lumbar) Pain Interference Pain Interference Pain Effect on Sleep: 2. Occasionally Pain Interference with Therapy Activities: 2. Occasionally Pain Interference with Day-to-Day Activities: 3. Frequently Home Living/Prior Functioning Home Living Available Help at Discharge: Family;Available 24 hours/day Type of Home: House Home Access: Stairs to enter [employer] of Steps: 2 Entrance Stairs-Rails:  (family persuing ramp) Home Layout: Two level;Able to live on main level with bedroom/bathroom Alternate Level Stairs-Number of Steps: Will sleep in bedroom on main level with full bathroom. Bathroom Shower/Tub: Multimedia programmer: Standard Bathroom Accessibility: Yes  Lives With: Spouse Prior Function Level of Independence: Independent with basic ADLs;Independent with homemaking with ambulation;Independent with gait  Able to Take Stairs?: Yes Driving: Yes Vocation: Full time employment Vision/Perception  Vision - History Ability to See in Adequate Light: 0 Adequate  Cognition Overall Cognitive Status: Within Functional Limits for tasks assessed Arousal/Alertness: Awake/alert Orientation Level: Oriented X4 Year: 2023 Month: March Day of Week: Correct Memory: Appears intact Sensation Sensation Light Touch: Appears Intact (L short leg cast but  intact at toes and denies  numbness and tingling) Proprioception: Appears Intact Motor  Motor Motor: Within Functional Limits Motor - Skilled Clinical Observations: TLSO, short leg cast   Trunk/Postural Assessment  Cervical Assessment Cervical Assessment: Within Functional Limits Thoracic Assessment Thoracic Assessment: Exceptions to [facility] (pt in TLSO due to lumbar fx) Lumbar Assessment Lumbar Assessment: Exceptions to WFL (TLSO) Postural Control Postural Control: Deficits on evaluation (limited ability to utilize trunk due to immobilization w/TLSO, UE and LE reactions intact)  Balance Balance Balance Assessed: Yes Static Sitting Balance Static Sitting - Balance Support: No upper extremity supported Static Sitting - Level of Assistance: 5: Stand by assistance (due to pallor w/exertion in sitting) Dynamic Sitting Balance Dynamic Sitting - Balance Support: No upper extremity supported Dynamic Sitting - Level of Assistance: 5: Stand by assistance (limited by spinal precautions/TLSO) Dynamic Sitting - Balance Activities: Lateral lean/weight shifting;Forward lean/weight shifting Sitting balance - Comments: maintains balance w/squat pivot wc to bed w/UE use, NWB LLE Static Standing Balance Static Standing - Balance Support: Right upper extremity supported;Left upper extremity supported Static Standing - Level of Assistance: 4: Min assist (varies w/fatigue) Extremity Assessment  RUE Assessment RUE Assessment: Exceptions to [facility] Passive Range of Motion (PROM) Comments: Pt able to self ROM shoulder to 90* in sitting and ER full w/elbow at side.  Limited by pain, defer to OT for full assessemtn Active Range of Motion (AROM) Comments: Assessment limited by pain. AROM slighlty <90 degrees at shoulder. WFL at elbow, wrist and digits. General Strength Comments: 4/4 at elbow, wrist and digit. Did not assess at shoulder 2/2 scapular fx. LUE Assessment LUE Assessment: Within Functional Limits General Strength Comments: 5/5  grossly RLE Assessment RLE Assessment: Within Functional Limits LLE Assessment Passive Range of Motion (PROM) Comments: WNL hip/knee Active Range of Motion (AROM) Comments: WNL hip/knee General Strength Comments: at least 3/5 hip/knee, ankle immobilized  Care Tool Care Tool Bed Mobility Roll left and right activity   Roll left and right assist level: Minimal Assistance - Patient > 75%    Sit to lying activity   Sit to lying assist level: Minimal Assistance - Patient > 75%    Lying to sitting on side of bed activity   Lying to sitting on side of bed assist level: the ability to move from lying on the back to sitting on the side of the bed with no back support.: Minimal Assistance - Patient > 75%     Care Tool Transfers Sit to stand transfer   Sit to stand assist level: Minimal Assistance - Patient > 75%    Chair/bed transfer   Chair/bed transfer assist level: Minimal Assistance - Patient > 75%     Physiological scientist transfer assist level: Minimal Assistance - Patient > 75%      Care Tool Locomotion Ambulation   Assist level: 2 helpers Assistive device: Walker-platform Max distance: 15  Walk 10 feet activity   Assist level: Minimal Assistance - Patient > 75% Assistive device: Walker-platform   Walk 50 feet with 2 turns activity   Assist level: Maximal Assistance - Patient 25 - 49% Assistive device: Walker-platform  Walk 150 feet activity   Assist level: Total Assistance - Patient < 25% Assistive device: Walker-platform  Walk 10 feet on uneven surfaces activity Walk 10 feet on uneven surfaces activity did not occur: Safety/medical concerns      Stairs Stair activity did not occur: Safety/medical concerns  Walk up/down 1 step activity Walk up/down 1 step or curb (drop down) activity did not occur: Safety/medical concerns      Walk up/down 4 steps activity Walk up/down 4 steps activity did not occur: Safety/medical concerns       Walk up/down 12 steps activity Walk up/down 12 steps activity did not occur: Safety/medical concerns      Pick up small objects from floor   Pick up small object from the floor assist level: Contact Guard/Touching assist    Wheelchair Is the patient using a wheelchair?: Yes Type of Wheelchair: Manual   Wheelchair assist level: Total Assistance - Patient < 25% Max wheelchair distance: 5  Wheel 50 feet with 2 turns activity   Assist Level: Dependent - Patient 0%  Wheel 150 feet activity   Assist Level: Dependent - Patient 0%PFRW    Refer to Care Plan for Long Term Goals  SHORT TERM GOAL WEEK 1 PT Short Term Goal 1 (Week 1): squat pivot w/set up assist only PT Short Term Goal 2 (Week 1): wc propulsion x 65f w/cues, additional time. PT Short Term Goal 3 (Week 1): Gait 463fw/PFRW NWB RLE w/cga PT Short Term Goal 4 (Week 1): mod I bed mobility  Recommendations for other services: [employer], [employer], Stress management, and Outing/community reintegration  Skilled Therapeutic Intervention  Evaluation completed (see details above and below) with education on PT POC and goals and individual treatment initiated with focus on functional mobility/transfers, LE strength, dynamic standing balance/coordination, ambulation, stair navigation, simulated car transfers, and improved endurance with activity Pt   oriented to rehab unit.  Discussed process of daily scheduling and receiving schedule, purpose of team conference, daily schedule and D/c planning.  Pt provided w/18x18 wheelchair and cushion and adjustments made to promote optimal seating posture and pressure distribution.  Pt also provided w/ pfrw and therapist adjusted to proper height for patient.   Mobility Bed Mobility Bed Mobility: Supine to Sit;Sit to Supine;Rolling Right Rolling Right: Supervision/verbal cueing Rolling Left: Minimal Assistance - Patient > 75% Supine to Sit: Minimal Assistance - Patient >  75% Sit to Supine: Contact Guard/Touching assist Transfers Transfers: Sit to Stand;Stand to Sit;Stand Pivot Transfers Sit to Stand: Minimal Assistance - Patient > 75% Stand to Sit: Minimal Assistance - Patient > 75% Stand Pivot Transfers: Minimal Assistance - Patient > 75% Stand Pivot Transfer Details: Verbal cues for precautions/safety;Verbal cues for safe use of DME/AE;Verbal cues for sequencing Stand Pivot Transfer Details (indicate cue type and reason): maintains NWB without cuing Transfer (Assistive device): Right platform walker Locomotion  Gait Ambulation: Yes Gait Assistance: 2 Helpers Gait Distance (Feet): 15 Feet Assistive device: Right platform walker Gait Assistance Details: wc follow due to poor ability to offload and clear, difficulty w/shoulder depression due to pain, pt appears very fatigued w/exertion Gait Gait: Yes Gait Pattern: Impaired Gait Pattern: Poor foot clearance - right Gait velocity: very decreased Stairs / Additional Locomotion Stairs: No Wheelchair Mobility Wheelchair Mobility: Yes Wheelchair Assistance: Dependent - Patient 0% Wheelchair Propulsion: Left upper extremity;Right lower extremity Wheelchair Parts Management: Needs assistance   Discharge Criteria: Patient will be discharged from PT if patient refuses treatment 3 consecutive times without medical reason, if treatment goals not met, if there is a change in medical status, if patient makes no progress towards goals or if patient is discharged from hospital.  The above assessment, treatment plan, treatment alternatives and goals were discussed and mutually agreed upon: by patient  BaJerrilyn Cairo/11/2021,  3:56 PM

## 2021-05-31 NOTE — Progress Notes (Signed)
Physical Therapy Session Note ? ?Patient Details  ?Name: Devin Roberts ?MRN: [id number] ?Date of Birth: [birth year] ? ?Today's Date: 05/31/2021 ?PT Individual Time: 1164-3539 ?PT Individual Time Calculation (min): 44 min  ? ?Short Term Goals: ?Week 1:  PT Short Term Goal 1 (Week 1): squat pivot w/set up assist only ?PT Short Term Goal 2 (Week 1): wc propulsion x 34f w/cues, additional time. ?PT Short Term Goal 3 (Week 1): Gait 452fw/PFRW NWB RLE w/cga ?PT Short Term Goal 4 (Week 1): mod I bed mobility ? ?Skilled Therapeutic Interventions/Progress Updates: Pt presented in bed with wife present agreeable to therapy. Pt states "a little" pain at RUE denies pain at rest on LLE. Rest breaks provided as needed with PTA providing ice pack at end of session. Prior to pt performing bed mobility PTA obtained standard 18x18 w/c as pt had one arm drive chair in room. Upon PTA's return to room pt performed supine to sit with supervision and use of bed features. At EOB pt donned shirt with set up and TLSO with minA. PTA provided edu to pt's wife regarding donning brace and making adjustments appropriately. Pt then donned R shoe with set up as pt was able to perform figure four position with RLE. Pt performed Sit to stand with CGA from EOB and hopped to w/c. Pt noted to have decreased RLE clearance and was unable to clear foot when turning. Once in w/c pt transported to rehab gym and PTA performed adjustment to ELR. Once completed pt instructed in propulsion using BUE then adding in RLE if desired. Pt was able to propel ~6042fncluding x 2 turns. Pt transported remaining distance back to room and once in room performed ambulatory transfer to bed in same manner as prior. Pt was able to perform sit to supine with use of bed rail and supervision. Pt repositioned to comfort and left with call bell within reach and needs met.  ? ? ?Therapy Documentation ?Precautions:  ?Precautions ?Precautions: Back, Fall ?Precaution Booklet Issued: No  (Verbal re-education proved) ?Required Braces or Orthoses: Spinal Brace, Sling ?Spinal Brace: Thoracolumbosacral orthotic, Applied in supine position ?Restrictions ?Weight Bearing Restrictions: Yes ?RUE Weight Bearing: Weight bearing as tolerated ?LLE Weight Bearing: Non weight bearing ?Other Position/Activity Restrictions: LE ROM to tolerance ?General: ?  ?Vital Signs: ?Therapy Vitals ?Temp: 98 ?F (36.7 ?C) ?Temp Source: Oral ?Pulse Rate: 79 ?Resp: 18 ?BP: 115/65 ?Patient Position (if appropriate): Lying ?Oxygen Therapy ?SpO2: 95 % ?O2 Device: Room Air ?Pain: ?  ? ?Exercises: ?  ?Other Treatments:   ? ? ? ?Therapy/Group: Individual Therapy ? ?[name] ?05/31/2021, 4:15 PM  ?

## 2021-05-31 NOTE — Discharge Summary (Incomplete)
Physician Discharge Summary  Patient ID: Devin Roberts MRN: [id number] DOB/AGE: 01-Dec-1960 61 y.o.  Admit date: 05/30/2021 Discharge date:   Discharge Diagnoses:  Principal Problem:   Critical polytrauma Active problems: Left pilon, proximal tibial fracture Right scapular fracture L1, L2 vertebral body compression fractures Hyperlipidemia Hypertension Hyperglycemia Constipation Insomnia Obesity  Discharged Condition: {condition:18240}  Significant Diagnostic Studies: DG Chest 1 View  Result Date: 05/25/2021 CLINICAL DATA:  Status post fall, loss of consciousness, head trauma EXAM: CHEST  1 VIEW COMPARISON:  04/16/2016 FINDINGS: The heart size and mediastinal contours are within normal limits. Both lungs are clear. The visualized skeletal structures are unremarkable. IMPRESSION: No active disease. Electronically Signed   By: Kathreen Devoid M.D.   On: 05/25/2021 15:55   DG Lumbar Spine 2-3 Views  Result Date: 05/29/2021 CLINICAL DATA:  L2 vertebral fracture EXAM: LUMBAR SPINE - 2-3 VIEW COMPARISON:  05/25/2021 CT chest, abdomen and pelvis FINDINGS: This report assumes 5 non rib-bearing lumbar vertebrae. Acute anterior inferior L1 vertebral fracture without significant loss of vertebral body height, with small displaced 0.8 cm fracture fragment anterior to the lower L1 vertebral body. Acute comminuted L2 vertebral body fracture with approximately 10-20% loss of vertebral body height, without definite fracture extension to the posterior margin of the L2 vertebral body. Mild multilevel lumbar degenerative disc disease. Mild 3 mm retrolisthesis at L1-2 and L2-3. No significant facet arthropathy. No aggressive appearing focal osseous lesions. IMPRESSION: 1. Acute anterior inferior L1 vertebral fracture without significant loss of vertebral body height. 2. Acute comminuted L2 vertebral body fracture with approximately 10-20% loss of vertebral body height. 3. Mild multilevel lumbar degenerative disc  disease. Mild multilevel lumbar spondylolisthesis as detailed. Electronically Signed   By: Ilona Sorrel M.D.   On: 05/29/2021 11:08   DG Shoulder Right  Result Date: 05/25/2021 CLINICAL DATA:  125lb chain fell onto pt right shoulder then pt slipped and twisted his left ankle. Pt denies LOC or head trauma. Pt arrives with splint on left ankle with left ankle deformity and right shoulder deformity. Pt c/o left foot, right shoulder, left ankle, and left lower leg pain. No hx of prior injuries or surgeries. EXAM: RIGHT SHOULDER - 2+ VIEW COMPARISON:  None. FINDINGS: Nondisplaced fracture of the scapula, extending obliquely from the lateral border 1-2 cm below the glenoid, to the superior scapula near the scapular notch. No other fractures.  No bone lesions. Glenohumeral and AC joints are normally spaced and aligned. IMPRESSION: 1. Nondisplaced fracture of the right scapula. 2. No other fractures.  No dislocation. Electronically Signed   By: Lajean Manes M.D.   On: 05/25/2021 15:57   DG Tibia/Fibula Left  Result Date: 05/28/2021 CLINICAL DATA:  Fluoroscopic assistance for reduction and internal fixation of fracture of distal tibia EXAM: LEFT TIBIA AND FIBULA - 2 VIEW COMPARISON:  05/25/2021 FINDINGS: Fluoroscopic images show reduction and internal fixation of comminuted fracture of distal radius. There are 2 surgical screws traversing distal fibula and tibia suggesting internal fixation of inferior tibiofibular joint. Fluoroscopic time was 58 seconds. Radiation dose is 2.25 mGy. IMPRESSION: Fluoroscopic assistance was provided for reduction and internal fixation of fracture of distal tibia. Electronically Signed   By: Elmer Picker M.D.   On: 05/28/2021 10:55   DG Tibia/Fibula Left  Result Date: 05/25/2021 CLINICAL DATA:  Injury, heavy chain fell onto patient, patient fell and twisted ankle EXAM: LEFT ANKLE - 2 VIEW; LEFT TIBIA AND FIBULA - 2 VIEW COMPARISON:  None. FINDINGS: Displaced oblique fracture of  the proximal left fibular diaphysis. No fracture or dislocation of the proximal tibia. Grossly displaced oblique fracture of the distal left tibial metadiaphysis, with an additional displaced fracture of the posterior malleolus. No fracture of the distal fibula. IMPRESSION: 1. Displaced oblique fracture of the proximal left fibular diaphysis. 2. Grossly displaced oblique fracture of the distal left tibial metadiaphysis, with an additional displaced fracture of the posterior malleolus. 3. No fracture of the distal fibula. Electronically Signed   By: Delanna Ahmadi M.D.   On: 05/25/2021 15:58   DG Ankle 2 Views Left  Result Date: 05/25/2021 CLINICAL DATA:  Fracture reduction EXAM: LEFT ANKLE - 2 VIEW COMPARISON:  05/25/2021 FINDINGS: Frontal, oblique, lateral views of the left ankle are obtained. Cast material obscures underlying bony detail. The oblique distal tibial metaphyseal fracture is again identified, with 1.5 cm of lateral displacement and 0.7 cm of dorsal displacement of the distal fracture fragment remaining. Near anatomic alignment of the posterior malleolar fracture seen previously. The ankle mortise is intact. There is diffuse soft tissue swelling. IMPRESSION: 1. Improved alignment of the distal tibial fracture, with residual dorsal and lateral displacement of the distal fracture fragment as above. 2. Near anatomic alignment of a posterior malleolar fracture. 3. Casting material obscures underlying bony detail. Electronically Signed   By: Randa Ngo M.D.   On: 05/25/2021 17:51   DG Ankle 2 Views Left  Result Date: 05/25/2021 CLINICAL DATA:  Injury, heavy chain fell onto patient, patient fell and twisted ankle EXAM: LEFT ANKLE - 2 VIEW; LEFT TIBIA AND FIBULA - 2 VIEW COMPARISON:  None. FINDINGS: Displaced oblique fracture of the proximal left fibular diaphysis. No fracture or dislocation of the proximal tibia. Grossly displaced oblique fracture of the distal left tibial metadiaphysis, with an  additional displaced fracture of the posterior malleolus. No fracture of the distal fibula. IMPRESSION: 1. Displaced oblique fracture of the proximal left fibular diaphysis. 2. Grossly displaced oblique fracture of the distal left tibial metadiaphysis, with an additional displaced fracture of the posterior malleolus. 3. No fracture of the distal fibula. Electronically Signed   By: Delanna Ahmadi M.D.   On: 05/25/2021 15:58   DG Ankle Complete Left  Result Date: 05/28/2021 CLINICAL DATA:  Postoperative exam.  Left ankle fracture. EXAM: LEFT ANKLE COMPLETE - 3+ VIEW COMPARISON:  CT left ankle 05/25/2021, left ankle radiographs 05/25/2021 FINDINGS: There is overlying casting material. New medial plate and screw fixation of the distal tibia. Additional anterior approach distal tibial metadiaphyseal screw. New distal fibular lateral plate and screw fixation hardware that also secures the distal tibiofibular syndesmosis. Marked improvement in prior displaced distal tibial metadiaphyseal fracture. Minimally displaced posterior malleolar fracture is again noted. The ankle mortise is symmetric and intact. Minimal distal medial malleolar degenerative osteophytes. There is partial visualization of a likely fracture of the mid left fibular diaphysis with mild lateral apex angulation. IMPRESSION:: IMPRESSION: 1. Interval ORIF of the distal tibia and distal tibiofibular syndesmosis with improved now anatomic alignment. 2. Partial visualization of a likely acute fracture of the mid left fibular diaphysis with mild lateral apex angulation. Electronically Signed   By: Yvonne Kendall M.D.   On: 05/28/2021 12:27   CT CERVICAL SPINE WO CONTRAST  Result Date: 05/25/2021 CLINICAL DATA:  Injury.  Facial trauma. EXAM: CT CERVICAL SPINE WITHOUT CONTRAST TECHNIQUE: Multidetector CT imaging of the cervical spine was performed without intravenous contrast. Multiplanar CT image reconstructions were also generated. RADIATION DOSE REDUCTION:  This exam was performed according to the departmental  dose-optimization program which includes automated exposure control, adjustment of the mA and/or kV according to patient size and/or use of iterative reconstruction technique. COMPARISON:  None. FINDINGS: Alignment: Normal except for minimal anterolisthesis of C3 on C4. Skull base and vertebrae: No acute fracture. No primary bone lesion or focal pathologic process. Soft tissues and spinal canal: No prevertebral fluid or swelling. No visible canal hematoma. Disc levels: Mild left foraminal narrowing at C3-C4 due to uncovertebral spurring. Severe facet arthropathy on the left side of C3-C4. Mild disc space narrowing and endplate changes [id number]. Upper chest: Not imaged Other: None IMPRESSION: 1. No acute bone abnormality in the cervical spine. 2. Mild degenerative changes particularly along the left side of C3-C4. Electronically Signed   By: Markus Daft M.D.   On: 05/25/2021 16:38   CT Ankle Left Wo Contrast  Result Date: 05/25/2021 CLINICAL DATA:  Ankle trauma. Fracture. Complex ankle fracture. 125 pound changed fell onto patient's right shoulder and patient twisted left ankle. EXAM: CT OF THE LEFT ANKLE WITHOUT CONTRAST TECHNIQUE: Multidetector CT imaging of the left ankle was performed according to the standard protocol. Multiplanar CT image reconstructions were also generated. RADIATION DOSE REDUCTION: This exam was performed according to the departmental dose-optimization program which includes automated exposure control, adjustment of the mA and/or kV according to patient size and/or use of iterative reconstruction technique. COMPARISON:  Left ankle radiographs 05/25/2021 FINDINGS: Bones/Joint/Cartilage There is a comminuted fracture again seen of the distal tibial diaphysis from a predominantly posterosuperior to anterior inferior and predominantly superior lateral inferior medial orientation. There is up to approximately 12 mm posterior and approximately 5  mm lateral displacement of the distal fracture component with respect to the proximal fracture component. There is an oblique fracture of the posterior malleolus with up to approximately 3 mm AP diastasis but otherwise no significant displacement. This extends through the distal tibial articular surface. There is mild peripheral degenerative spurring of the medial aspect of the talar dome. There is a small region of multiple tiny cortical fragments indicating avulsion injury at the anterior distal fibula at the likely inferior aspect of the anterior tibiofibular ligament insertion (axial series 3, image 76). Additional possible avulsion injury at the more inferior distal fibula at the anterior talofibular ligament insertion. Ligaments Suboptimally assessed by CT. Muscles and Tendons Grossly unremarkable. Soft tissues Mild regional soft tissue swelling. IMPRESSION:: IMPRESSION: 1. Comminuted, predominantly oblique, moderately acute displaced fracture of the distal tibial diaphysis extending into the anterior distal tibial metaphysis. 2. Nondisplaced intra-articular fracture of the posterior malleolus. 3. Small comminuted fracture fragments from avulsion injury at the anterior distal fibula likely at the inferior insertion of the anterior tibiofibular ligament. Possible avulsion injury at the more inferior distal fibula at the anterior talofibular ligament insertion. Electronically Signed   By: Yvonne Kendall M.D.   On: 05/25/2021 19:29   CT CHEST ABDOMEN PELVIS W CONTRAST  Result Date: 05/25/2021 CLINICAL DATA:  Work injury. 125 pound chain fell on his right shoulder. EXAM: CT CHEST, ABDOMEN, AND PELVIS WITH CONTRAST TECHNIQUE: Multidetector CT imaging of the chest, abdomen and pelvis was performed following the standard protocol during bolus administration of intravenous contrast. RADIATION DOSE REDUCTION: This exam was performed according to the departmental dose-optimization program which includes automated  exposure control, adjustment of the mA and/or kV according to patient size and/or use of iterative reconstruction technique. CONTRAST:  14mL OMNIPAQUE IOHEXOL 300 MG/ML  SOLN COMPARISON:  CT abdomen pelvis dated January 25, 2017. FINDINGS: CT CHEST FINDINGS Cardiovascular:  No significant vascular findings. Normal heart size. No pericardial effusion. Mediastinum/Nodes: No enlarged mediastinal, hilar, or axillary lymph nodes. Thyroid gland, trachea, and esophagus demonstrate no significant findings. Lungs/Pleura: Minimal subsegmental atelectasis in both posterior lower lobes. No focal consolidation, pleural effusion, or pneumothorax. Musculoskeletal: Acute comminuted minimally displaced fracture of the right scapular body and spine. No extension to the glenoid. No additional fracture. CT ABDOMEN PELVIS FINDINGS Hepatobiliary: No focal liver abnormality is seen. No gallstones, gallbladder wall thickening, or biliary dilatation. Pancreas: Unremarkable. No pancreatic ductal dilatation or surrounding inflammatory changes. Spleen: Normal in size without focal abnormality. Adrenals/Urinary Tract: Adrenal glands are unremarkable. Kidneys are normal, without renal calculi, focal lesion, or hydronephrosis. Bladder is unremarkable. Stomach/Bowel: Stomach is within normal limits. Appendix appears normal. No evidence of bowel wall thickening, distention, or inflammatory changes. Vascular/Lymphatic: No significant vascular findings are present. No enlarged abdominal or pelvic lymph nodes. Reproductive: Prostate is unremarkable. Other: No abdominal wall hernia or abnormality. No abdominopelvic ascites. No pneumoperitoneum. Musculoskeletal: Acute comminuted mildly depressed fracture of the L2 vertebral body without posterior involvement. No retropulsion. Acute nondisplaced longitudinal fracture through the right posterolateral L1 inferior endplate (series 6, image 127). IMPRESSION: 1. Acute comminuted minimally displaced fracture  of the right scapular body and spine. 2. Acute comminuted mildly depressed anterior column fracture of the L2 vertebral body. 3. Acute nondisplaced longitudinal fracture through the right posterolateral L1 inferior endplate. 4.  No acute intrathoracic or intra-abdominal traumatic injury. Electronically Signed   By: Titus Dubin M.D.   On: 05/25/2021 16:42   CT 3D RECON AT SCANNER  Result Date: 05/25/2021 CLINICAL DATA:  Ankle trauma. Fracture. Complex ankle fracture. 125 pound changed fell onto patient's right shoulder and patient twisted left ankle. EXAM: CT OF THE LEFT ANKLE WITHOUT CONTRAST TECHNIQUE: Multidetector CT imaging of the left ankle was performed according to the standard protocol. Multiplanar CT image reconstructions were also generated. RADIATION DOSE REDUCTION: This exam was performed according to the departmental dose-optimization program which includes automated exposure control, adjustment of the mA and/or kV according to patient size and/or use of iterative reconstruction technique. COMPARISON:  Left ankle radiographs 05/25/2021 FINDINGS: Bones/Joint/Cartilage There is a comminuted fracture again seen of the distal tibial diaphysis from a predominantly posterosuperior to anterior inferior and predominantly superior lateral inferior medial orientation. There is up to approximately 12 mm posterior and approximately 5 mm lateral displacement of the distal fracture component with respect to the proximal fracture component. There is an oblique fracture of the posterior malleolus with up to approximately 3 mm AP diastasis but otherwise no significant displacement. This extends through the distal tibial articular surface. There is mild peripheral degenerative spurring of the medial aspect of the talar dome. There is a small region of multiple tiny cortical fragments indicating avulsion injury at the anterior distal fibula at the likely inferior aspect of the anterior tibiofibular ligament  insertion (axial series 3, image 76). Additional possible avulsion injury at the more inferior distal fibula at the anterior talofibular ligament insertion. Ligaments Suboptimally assessed by CT. Muscles and Tendons Grossly unremarkable. Soft tissues Mild regional soft tissue swelling. IMPRESSION:: IMPRESSION: 1. Comminuted, predominantly oblique, moderately acute displaced fracture of the distal tibial diaphysis extending into the anterior distal tibial metaphysis. 2. Nondisplaced intra-articular fracture of the posterior malleolus. 3. Small comminuted fracture fragments from avulsion injury at the anterior distal fibula likely at the inferior insertion of the anterior tibiofibular ligament. Possible avulsion injury at the more inferior distal fibula at the anterior talofibular ligament insertion.  Electronically Signed   By: Yvonne Kendall M.D.   On: 05/25/2021 19:29   DG Foot 2 Views Left  Result Date: 05/25/2021 CLINICAL DATA:  Twisted ankle EXAM: LEFT FOOT - 2 VIEW COMPARISON:  Left foot radiographs 06/30/2014 FINDINGS: The distal tibial fracture is better assessed on the dedicated ankle radiographs. No additional fracture of the foot is seen. Alignment appears normal. There is no erosive change. The soft tissues of the foot are unremarkable. IMPRESSION: No acute fracture or dislocation of the foot. Please also see separately dictated ankle radiographs. Electronically Signed   By: Valetta Mole M.D.   On: 05/25/2021 16:03   DG C-Arm 1-60 Min-No Report  Result Date: 05/28/2021 Fluoroscopy was utilized by the requesting physician.  No radiographic interpretation.   DG C-Arm 1-60 Min-No Report  Result Date: 05/28/2021 Fluoroscopy was utilized by the requesting physician.  No radiographic interpretation.    Labs:  Basic Metabolic Panel: Recent Labs  Lab 05/25/21 1453 05/25/21 1505 05/26/21 0043 05/27/21 0026 05/28/21 0117 05/31/21 0541  NA 136 137 135 137 139 136  K 3.9 4.0 3.7 3.9 3.8 4.2  CL  102 100 104 104 104 101  CO2 27  --  20* 25 29 27   GLUCOSE 217* 214* 151* 145* 143* 121*  BUN 32* 36* 29* 18 18 22*  CREATININE 1.45* 1.50* 1.10 0.89 1.00 0.90  CALCIUM 8.7*  --  8.4* 8.7* 8.9 9.0    CBC: Recent Labs  Lab 05/28/21 0117 05/29/21 0130 05/31/21 0541  WBC 10.6* 14.0* 13.9*  NEUTROABS  --   --  10.7*  HGB 13.0 12.1* 12.8*  HCT 37.1* 34.5* 35.6*  MCV 93.0 92.7 93.7  PLT 181 189 227    CBG: Recent Labs  Lab 05/30/21 1700 05/30/21 2103 05/31/21 0631 05/31/21 1155 05/31/21 1652  GLUCAP 120* 153* 126* 138* 133*    Brief HPI:   NIHAR SERAFINO is a 61 y.o. male involved in an on-the-job injury 05/25/2021 resulting in polytrauma. No head internal viscera injuries. He required ORIF of left lower extremity. Incurred right scapular body fracture as well as L1, L2 vertebral body fracture. Sling for comfort and TSLO fitted while OOB. Hemodynamically stable.   Hospital Course: JAVY TIBBITS was admitted to rehab 05/30/2021 for inpatient therapies to consist of PT, ST and OT at least three hours five days a week. Past admission physiatrist, therapy team and rehab RN have worked together to provide customized collaborative inpatient rehab. Constipation at admission treated with sorbitol with good results. Leukocytosis at admission without signs or symptoms of infection. UA negative PTA.   Blood pressures were monitored on TID basis and controlled on chlorthalidone 25 mg daily.  Diabetes has been monitored with ac/hs CBG checks and SSI was use prn for tighter BS control.    Rehab course: During patient's stay in rehab weekly team conferences were held to monitor patient's progress, set goals and discuss barriers to discharge. At admission, patient required  He/She  has had improvement in activity tolerance, balance, postural control as well as ability to compensate for deficits. He/She has had improvement in functional use RUE/LUE  and RLE/LLE as well as improvement in  awareness  Disposition:  There are no questions and answers to display.       Diet:  Special Instructions:  No driving, alcohol consumption or tobacco use.   30-35 minutes were spent on discharge planning and discharge summary.  Allergies as of 05/31/2021  Reactions   Codeine Itching   Lisinopril    REACTION: Allergic to ACE inhibitors w/ cough   Simvastatin    REACTION: pt states ZOCOR caused leg cramps     Med Rec must be completed prior to using this [place]***        Signed: Barbie Banner 05/31/2021, 4:57 PM

## 2021-05-31 NOTE — Progress Notes (Signed)
Inpatient Rehabilitation  Patient information reviewed and entered into eRehab system by Dacen Frayre M. Keifer Habib, M.A., CCC/SLP, PPS Coordinator.  Information including medical coding, functional ability and quality indicators will be reviewed and updated through discharge.    

## 2021-05-31 NOTE — Evaluation (Signed)
Occupational Therapy Assessment and Plan  Patient Details  Name: Devin Roberts MRN: [id number] Date of Birth: 1960/05/17  OT Diagnosis: acute pain, muscle weakness (generalized), and pain in joint Rehab Potential: Rehab Potential (ACUTE ONLY): Good ELOS: 1-1.5 weeks   Today's Date: 05/31/2021 OT Individual Time: 0802-0900 OT treatment time: 49 minutes   Hospital Problem: Principal Problem:   Critical polytrauma   Past Medical History:  Past Medical History:  Diagnosis Date   Acute asthmatic bronchitis    Allergic rhinitis    Anxiety    Chronic insomnia    Diabetes mellitus    DJD (degenerative joint disease)    GERD (gastroesophageal reflux disease)    Headache(784.0)    Hypercholesterolemia    Hypertension    Obesity    Past Surgical History:  Past Surgical History:  Procedure Laterality Date   ORIF ANKLE FRACTURE Left 05/28/2021   Procedure: OPEN REDUCTION INTERNAL FIXATION (ORIF) TIBIA/FIBULA FRACTURE;  Surgeon: Altamese [place]ne[place], MD;  Location: [facility];  Service: Orthopedics;  Laterality: Left;   SHOULDER SURGERY      Assessment & Plan Clinical Impression: Devin Roberts is a 61 year old male who sustained an injury while working.  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  He was brought to [facility] emergency department via [facility].  He was alert and oriented x4 and hemodynamically stable.  Imaging revealed left tibia/fibular shaft fracture and underwent ORIF repair by Dr. Marcelino Scot.  He also sustained a right scapular body fracture that did not require operative treatment.  He was initially nonweightbearing and sling.  CT scan revealed a vertical fracture through the L1 vertebral body and a mild compression fracture of L2.  Neurosurgery (Dr. Sherley Bounds) was consulted and he was fitted for TLSO brace.  Laboratory work-up revealed elevated serum creatinine  consistent with acute kidney injury.  This normalized quickly and has been normal in the last 3 days.  Elevated white blood cell count to 14,000 without fever or signs of infection.   Orthopedic recommendations as follows: Continue Lovenox for approximately 21 days.  This would take him through 3/27.  He can use sling for comfort for right upper extremity and can use right upper extremity to help mobilize.  May weight-bear through right upper extremity.  He is nonweightbearing of left lower extremity for 8 weeks.  He will continue his splint for 2 weeks then can convert cam boot at that time.  Recommend suture removal around 3/20.  Unrestricted left knee and hip range of motion. TLSO when out of bed. The patient requires inpatient medicine and rehabilitation evaluations and services for ongoing dysfunction secondary to polytrauma.   Patient's past medical history is significant for hypertension, hyperlipidemia.  During hospitalization he has had elevated glucoses and his A1c is 6.5. He currently complains of right shoulder pain. Patient transferred to CIR on 05/30/2021 .    Patient currently requires min with basic self-care skills secondary to muscle weakness and decreased standing balance, decreased balance strategies, and difficulty maintaining precautions.  Prior to hospitalization, patient could complete ADLs/IADLs with independence.  Patient will benefit from skilled intervention to decrease level of assist with basic self-care skills and increase independence with basic self-care skills prior to discharge home with care partner.  Anticipate patient will require intermittent supervision and follow up outpatient.  OT - End of Session  Endurance Deficit: Yes Endurance Deficit Description: Quick to fatigue OT Assessment Rehab Potential (ACUTE ONLY): Good OT Barriers to Discharge: Inaccessible home environment;Weight bearing restrictions OT Patient demonstrates impairments in the following area(s):  Balance;Endurance;Pain;Safety OT Basic ADL's Functional Problem(s): Grooming;Bathing;Dressing;Toileting OT Transfers Functional Problem(s): Toilet;Tub/Shower OT Additional Impairment(s): Fuctional Use of Upper Extremity OT Plan OT Intensity: Minimum of 1-2 x/day, 45 to 90 minutes OT Frequency: 5 out of 7 days OT Duration/Estimated Length of Stay: 1-1.5 weeks OT Treatment/Interventions: Balance/vestibular training;Community reintegration;Discharge planning;DME/adaptive equipment instruction;Functional mobility training;Pain management;Patient/family education;Self Care/advanced ADL retraining;Splinting/orthotics;Therapeutic Activities;Therapeutic Exercise;UE/LE Strength taining/ROM;UE/LE Coordination activities OT Self Feeding Anticipated Outcome(s): N/A OT Basic Self-Care Anticipated Outcome(s): S OT Toileting Anticipated Outcome(s): S OT Bathroom Transfers Anticipated Outcome(s): S OT Recommendation Patient destination: Home Follow Up Recommendations: Outpatient OT Equipment Recommended: To be determined   OT Evaluation Precautions/Restrictions  Precautions Precautions: Back;Fall Precaution Booklet Issued: No (Verbal re-education proved) Required Braces or Orthoses: Spinal Brace;Sling Spinal Brace: Thoracolumbosacral orthotic;Applied in supine position Restrictions Weight Bearing Restrictions: Yes RUE Weight Bearing: Weight bearing as tolerated LLE Weight Bearing: Non weight bearing Other Position/Activity Restrictions: LE ROM to tolerance General Chart Reviewed: Yes Family/Caregiver Present: Yes (Wife Devin Roberts) Vital Signs   Pain Pain Assessment Pain Scale: 0-10 Pain Score: 5  Pain Location:  (back, shoulder) Pain Orientation: Right;Medial (R shoulder, medial lumbar) Home Living/Prior Functioning Home Living Family/patient expects to be discharged to:: Private residence Living Arrangements: Spouse/significant other Available Help at Discharge: Family, Available 24  hours/day Type of Home: House Home Access: Stairs to enter [employer] of Steps: 2 Entrance Stairs-Rails:  (family persuing ramp) Home Layout: Two level, Able to live on main level with bedroom/bathroom Alternate Level Stairs-Number of Steps: Will sleep in bedroom on main level with full bathroom. Bathroom Shower/Tub: Engineer, mining: Yes  Lives With: Spouse Prior Function Level of Independence: Independent with basic ADLs, Independent with homemaking with ambulation, Independent with gait  Able to Take Stairs?: Yes Driving: Yes Vocation: Full time employment Vision Baseline Vision/History: 0 No visual deficits Ability to See in Adequate Light: 0 Adequate Patient Visual Report: No change from baseline Vision Assessment?: No apparent visual deficits Perception  Perception: Within Functional Limits Praxis Praxis: Intact Cognition Overall Cognitive Status: Within Functional Limits for tasks assessed Arousal/Alertness: Awake/alert Orientation Level: Person;Place;Situation Person: Oriented Place: Oriented Situation: Oriented Year: 2023 Month: March Day of Week: Correct Memory: Appears intact Immediate Memory Recall: Sock;Blue;Bed Memory Recall Sock: Without Cue Memory Recall Blue: Without Cue Memory Recall Bed: Without Cue Safety/Judgment: Appears intact Sensation Sensation Light Touch: Appears Intact (L short leg cast but intact at toes and denies numbness and tingling) Proprioception: Appears Intact Coordination Gross Motor Movements are Fluid and Coordinated: Yes Fine Motor Movements are Fluid and Coordinated: Yes Finger Nose Finger Test: Difficult to assess on R 2/2 decreased ROM with scapular fx. Motor  Motor Motor: Within Functional Limits Motor - Skilled Clinical Observations: TLSO, short leg cast  Trunk/Postural Assessment  Cervical Assessment Cervical Assessment: Within Functional Limits Thoracic  Assessment Thoracic Assessment: Exceptions to [facility] (pt in TLSO due to lumbar fx) Lumbar Assessment Lumbar Assessment: Exceptions to WFL (TLSO) Postural Control Postural Control: Deficits on evaluation (limited ability to utilize trunk due to immobilization w/TLSO, UE and LE reactions intact)  Balance Balance Balance Assessed: Yes Static Sitting Balance Static Sitting - Balance Support: No upper extremity supported Static Sitting - Level of Assistance: 5: Stand by assistance (due to pallor w/exertion in sitting) Dynamic Sitting Balance Dynamic Sitting -  Balance Support: No upper extremity supported Dynamic Sitting - Level of Assistance: 5: Stand by assistance (limited by spinal precautions/TLSO) Dynamic Sitting - Balance Activities: Lateral lean/weight shifting;Forward lean/weight shifting Sitting balance - Comments: maintains balance w/squat pivot wc to bed w/UE use, NWB LLE Static Standing Balance Static Standing - Balance Support: Right upper extremity supported;Left upper extremity supported Static Standing - Level of Assistance: 4: Min assist (varies w/fatigue) Dynamic Standing Balance Dynamic Standing - Balance Support: Right upper extremity supported;Left upper extremity supported Dynamic Standing - Level of Assistance: 4: Min assist Extremity/Trunk Assessment RUE Assessment RUE Assessment: Exceptions to [facility] Passive Range of Motion (PROM) Comments: Pt able to self ROM shoulder to 90* in sitting and ER full w/elbow at side.  Limited by pain, defer to OT for full assessemtn Active Range of Motion (AROM) Comments: Assessment limited by pain. AROM slighlty <90 degrees at shoulder. WFL at elbow, wrist and digits. General Strength Comments: 4/4 at elbow, wrist and digit. Did not assess at shoulder 2/2 scapular fx. LUE Assessment LUE Assessment: Within Functional Limits General Strength Comments: 5/5 grossly  Care Tool Care Tool Self Care Eating   Eating Assist Level: Independent     Oral Care    Oral Care Assist Level: Set up assist    Bathing   Body parts bathed by patient: Right arm;Left arm;Chest;Abdomen;Front perineal area;Right upper leg;Left upper leg;Face Body parts bathed by helper: Right lower leg;Buttocks Body parts n/a: Left lower leg Assist Level: Minimal Assistance - Patient > 75%    Upper Body Dressing(including orthotics)   What is the patient wearing?: Pull over shirt   Assist Level: Minimal Assistance - Patient > 75%    Lower Body Dressing (excluding footwear)   What is the patient wearing?: Pants;Underwear/pull up Assist for lower body dressing: Moderate Assistance - Patient 50 - 74%    Putting on/Taking off footwear   What is the patient wearing?: Socks;Shoes Assist for footwear: Minimal Assistance - Patient > 75%       Care Tool Toileting Toileting activity Toileting Activity did not occur Landscape architect and hygiene only): N/A (no void or bm)       Care Tool Bed Mobility Roll left and right activity   Roll left and right assist level: Supervision/Verbal cueing    Sit to lying activity Sit to lying activity did not occur: N/A      Lying to sitting on side of bed activity   Lying to sitting on side of bed assist level: the ability to move from lying on the back to sitting on the side of the bed with no back support.: Supervision/Verbal cueing     Care Tool Transfers Sit to stand transfer   Sit to stand assist level: Minimal Assistance - Patient > 75%    Chair/bed transfer   Chair/bed transfer assist level: Minimal Assistance - Patient > 75%     Toilet transfer   Assist Level: Minimal Assistance - Patient > 75%     Care Tool Cognition  Expression of Ideas and Wants Expression of Ideas and Wants: 4. Without difficulty (complex and basic) - expresses complex messages without difficulty and with speech that is clear and easy to understand  Understanding Verbal and Non-Verbal Content Understanding Verbal and Non-Verbal  Content: 4. Understands (complex and basic) - clear comprehension without cues or repetitions   Memory/Recall Ability Memory/Recall Ability : Current season;That he or she is in a hospital/hospital unit   Refer to Care Plan for [place]  SHORT TERM GOAL WEEK 1 OT Short Term Goal 1 (Week 1): Patient will complete sit to stand transfers with CGA and LRAD. OT Short Term Goal 2 (Week 1): Patient will complete toilet transfers with CGA and LRAD. OT Short Term Goal 3 (Week 1): Patient will don LB clothing with CGA, AE and LRAD.  Recommendations for other services: Other: TBD    Skilled Therapeutic Intervention Patient met lying supine in bed in agreement with OT treatment session. Pain reported in low back and R shoulder. Reports receiving pain meds this a.m. Patient completed bathing/dressing at EOB. Please refer below for additional details. Education on purpose or rehab, roles of OT, ELOS and goals. Patient/spouse expressed verbal understanding. Patient limited by deficits listed above and would benefit from continued occupational therapy services to maximize safety/independence in prep for safe d/c home with spouse.   ADL ADL Eating: Independent Grooming: Setup Where Assessed-Grooming: Edge of bed Upper Body Bathing: Minimal assistance Where Assessed-Upper Body Bathing: Edge of bed Lower Body Bathing: Minimal assistance Where Assessed-Lower Body Bathing: Edge of bed Upper Body Dressing: Minimal assistance Where Assessed-Upper Body Dressing: Edge of bed Lower Body Dressing: Moderate assistance Where Assessed-Lower Body Dressing: Edge of bed Toileting: Not assessed Toilet Transfer Method: Not assessed Gaffer Transfer: Not assessed ADL Comments: Cues for activity pacing, adherence to spinal precautions and adhernece to LLE NWB precautions. Mobility  Bed Mobility Bed Mobility: Supine to Sit;Sit to Supine;Rolling Right Rolling Right: Supervision/verbal cueing Rolling  Left: Minimal Assistance - Patient > 75% Supine to Sit: Minimal Assistance - Patient > 75% Sit to Supine: Contact Guard/Touching assist Transfers Sit to Stand: Minimal Assistance - Patient > 75% Stand to Sit: Minimal Assistance - Patient > 75%   Discharge Criteria: Patient will be discharged from OT if patient refuses treatment 3 consecutive times without medical reason, if treatment goals not met, if there is a change in medical status, if patient makes no progress towards goals or if patient is discharged from hospital.  The above assessment, treatment plan, treatment alternatives and goals were discussed and mutually agreed upon: by patient  Brookelle Pellicane R Howerton-Davis 05/31/2021, 12:41 PM

## 2021-05-31 NOTE — Progress Notes (Signed)
?                                                       PROGRESS NOTE ? ? ?Subjective/Complaints: ? ?Doesn't like pain meds, however knows "needs to take them".  ?Asking when is best to take them- took 10 mg Oxy last night; but 5 mg this AM before therapy.  ? ?LBM 6 days ago- before admission to hospital- is passing gas.  ?Asking when gets IV out.  ? ?Otherwise, doesn't feel sick- doesn't feel bad- ? ?ROS: ? ?Pt denies SOB, abd pain, CP, N/V/C/D, and vision changes ? ? ?Objective: ?  ?DG Lumbar Spine 2-3 Views ? ?Result Date: 05/29/2021 ?CLINICAL DATA:  L2 vertebral fracture EXAM: LUMBAR SPINE - 2-3 VIEW COMPARISON:  05/25/2021 CT chest, abdomen and pelvis FINDINGS: This report assumes 5 non rib-bearing lumbar vertebrae. Acute anterior inferior L1 vertebral fracture without significant loss of vertebral body height, with small displaced 0.8 cm fracture fragment anterior to the lower L1 vertebral body. Acute comminuted L2 vertebral body fracture with approximately 10-20% loss of vertebral body height, without definite fracture extension to the posterior margin of the L2 vertebral body. Mild multilevel lumbar degenerative disc disease. Mild 3 mm retrolisthesis at L1-2 and L2-3. No significant facet arthropathy. No aggressive appearing focal osseous lesions. IMPRESSION: 1. Acute anterior inferior L1 vertebral fracture without significant loss of vertebral body height. 2. Acute comminuted L2 vertebral body fracture with approximately 10-20% loss of vertebral body height. 3. Mild multilevel lumbar degenerative disc disease. Mild multilevel lumbar spondylolisthesis as detailed. Electronically Signed   By: Delbert PhenixJason A Poff M.D.   On: 05/29/2021 11:08   ?Recent Labs  ?  05/29/21 ?0130 05/31/21 ?0541  ?WBC 14.0* 13.9*  ?HGB 12.1* 12.8*  ?HCT 34.5* 35.6*  ?PLT 189 227  ? ?Recent Labs  ?  05/31/21 ?0541  ?NA 136  ?K 4.2  ?CL 101  ?CO2 27  ?GLUCOSE 121*  ?BUN 22*  ?CREATININE 0.90  ?CALCIUM 9.0  ? ? ?Intake/Output Summary (Last 24  hours) at 05/31/2021 0919 ?Last data filed at 05/30/2021 2108 ?Gross per 24 hour  ?Intake --  ?Output 600 ml  ?Net -600 ml  ?  ? ?  ? ?Physical Exam: ?Vital Signs ?Blood pressure 127/82, pulse 74, temperature 97.8 ?F (36.6 ?C), resp. rate 18, height 5\' 8"  (1.727 m), SpO2 94 %. ? ? ? ?General: awake, alert, appropriate, sitting up slightly in bed; wife at bedside; NAD ?HENT: conjugate gaze; oropharynx moist ?CV: regular rate and rhythm; no JVD ?Pulmonary: CTA B/L; no W/R/R- good air movement ?GI: soft, NT, slightly distended; normoactive BS ?Psychiatric: appropriate ?Neurological: Ox3 ?Musculoskeletal:  ?   Comments: LLE splint and surgical dressing in place . Strength and sensation intact. RUE strength and sensation intact ?Skin: ?   General: Skin is warm and dry.  ? ?Assessment/Plan: ?1. Functional deficits which require 3+ hours per day of interdisciplinary therapy in a comprehensive inpatient rehab setting. ?Physiatrist is providing close team supervision and 24 hour management of active medical problems listed below. ?Physiatrist and rehab team continue to assess barriers to discharge/monitor patient progress toward functional and medical goals ? ?Care Tool: ? ?Bathing ?   ?Body parts bathed by patient: Right arm, Left arm, Chest, Abdomen, Front perineal area, Right upper leg, Left upper leg, Face  ?  Body parts bathed by helper: Right lower leg, Buttocks ?Body parts n/a: Left lower leg ?  ?Bathing assist Assist Level: Minimal Assistance - Patient > 75% ?  ?  ?Upper Body Dressing/Undressing ?Upper body dressing   ?What is the patient wearing?: Pull over shirt ?   ?Upper body assist Assist Level: Minimal Assistance - Patient > 75% ?   ?Lower Body Dressing/Undressing ?Lower body dressing ? ? ?   ?What is the patient wearing?: Pants, Underwear/pull up ? ?  ? ?Lower body assist Assist for lower body dressing: Moderate Assistance - Patient 50 - 74% ?   ? ?Toileting ?Toileting Toileting Activity did not occur (Programmer, applications and hygiene only): N/A (no void or bm)  ?Toileting assist Assist for toileting: Total Assistance - Patient < 25% ?  ?  ?Transfers ?Chair/bed transfer ? ?Transfers assist ?   ? ?Chair/bed transfer assist level: Minimal Assistance - Patient > 75% ?  ?  ?Locomotion ?Ambulation ? ? ?Ambulation assist ? ?   ? ?  ?  ?   ? ?Walk 10 feet activity ? ? ?Assist ?   ? ?  ?   ? ?Walk 50 feet activity ? ? ?Assist   ? ?  ?   ? ? ?Walk 150 feet activity ? ? ?Assist   ? ?  ?  ?  ? ?Walk 10 feet on uneven surface  ?activity ? ? ?Assist   ? ? ?  ?   ? ?Wheelchair ? ? ? ? ?Assist   ?  ?  ? ?  ?   ? ? ?Wheelchair 50 feet with 2 turns activity ? ? ? ?Assist ? ?  ?  ? ? ?   ? ?Wheelchair 150 feet activity  ? ? ? ?Assist ?   ? ? ?   ? ?Blood pressure 127/82, pulse 74, temperature 97.8 ?F (36.6 ?C), resp. rate 18, height 5\' 8"  (1.727 m), SpO2 94 %. ? ?Medical Problem List and Plan: ?1. Functional deficits secondary to polytrauma ?            -patient may shower ?            -ELOS/Goals: S 10-14 days ?            -First day of evaluations- PT and OT- NWB LLE- WBAT RUE- sling for comfort; no ROM restrictions ?2.  Antithrombotics: ?-DVT/anticoagulation:  Pharmaceutical: Lovenox ?            -antiplatelet therapy: None (was on aspirin 81 mg daily prior to admission) ?3. Pain Management: Tylenol as needed, Robaxin 3 times daily, oxycodone as needed ?            -ice and elevate LLE ? 3/9- d/w pt- suggest using 5-10 mg in Am before therapy and at lunch- and then at bedtime- can take more, but at least 3x/day for the next few days.  ?4. Mood: LCSW to evaluate and provide emotional support ?            -antipsychotic agents: n/a ?5. Neuropsych: This patient is capable of making decisions on his own behalf. ?6. Skin/Wound Care: Routine skin care checks ?            -- Monitor LLE incisions: Sutures out approximately 3/20 ?7. Fluids/Electrolytes/Nutrition: Routine I's and O's and follow-up chemistries ?            --continue carb modified  diet ?8: POD 2 ORIF left pilon fracture and syndesmosis, left  proximal tibia fracture ?            --NWB times 8 weeks ?            --splint times 2 weeks, then CAM boot (3/20) ?            --unrestricted left knee and hip ROM ?9: right scapular fracture: non-operative repair ?            --sling for comfort ?            --early ROM ?            --can use RUE to help mobilize ?            --may weight bear through RUE ?10: L1, L2 vertebral body compression fractures ?            --continue TSLO when out of bed ?11: Hyperlipidemia: Pravachol (and Vascepa) on home med list but statin listed as allergy/intolerant>>wife says not allergic and has been tolerating so will restart ?12: Hypertension: continue chlorthalidone 25 mg daily ?            --on Edarbyclor 40/25 at home ? 3/9- BP controlled- con't regimen ?13: Hyperglycemia with A1c = 6.5 ?            --Continue CBGs QID and SSI ?14: Constipation: continue scheduled Colace and PRNs ordered ?            -3/9- LBM 6 days ago- will give Sorbitol 45cc at lunch per pt request-  ?15: Insomnia, chronic: takes Ambien 10 mg every night>>will resume ?16. Obesity BMI 33.5: provide dietary education ?  ? ?I spent a total of   37 minutes on total care today- >50% coordination of care- due to d/w OT about WB restrictions as well as prolonged d/w pt about pain meds.  ? ? ?LOS: ?1 days ?A FACE TO FACE EVALUATION WAS PERFORMED ? ?Otilia Kareem ?05/31/2021, 9:19 AM  ? ? ? ?

## 2021-06-01 DIAGNOSIS — T07XXXA Unspecified multiple injuries, initial encounter: Secondary | ICD-10-CM | POA: Diagnosis not present

## 2021-06-01 LAB — GLUCOSE, CAPILLARY
Glucose-Capillary: 131 mg/dL — ABNORMAL HIGH (ref 70–99)
Glucose-Capillary: 138 mg/dL — ABNORMAL HIGH (ref 70–99)
Glucose-Capillary: 136 mg/dL — ABNORMAL HIGH (ref 70–99)
Glucose-Capillary: 133 mg/dL — ABNORMAL HIGH (ref 70–99)

## 2021-06-01 MED ORDER — ZOLPIDEM TARTRATE 5 MG PO TABS
5.0000 mg | ORAL_TABLET | Freq: Every day | ORAL | Status: DC
Start: 2021-06-01 — End: 2021-06-07
  Administered 2021-06-01 – 2021-06-06 (×6): 5 mg via ORAL
  Filled 2021-06-01 (×6): qty 1

## 2021-06-01 NOTE — Progress Notes (Deleted)
Patient ID: Devin Roberts, male   DOB: [birth year], 61 y.o.   MRN: [id number] ?
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
## 2021-06-01 NOTE — Progress Notes (Signed)
Patient ID: Devin Roberts, male   DOB: [birth year], 61 y.o.   MRN: [id number] ? ?SW left messge for Steve/Worker's Comp CM with [employer] [phone]) to inform on assigned SW, and will coordinate care needs; SW requested follow-up to discuss processes with WC. ?*SW received return phone call from [facility] who requested to have clinicals daily, will need to check if Rx card has been sent to the home, and will use [facility]/HH company to coordinate all of pt care needs. Requests HH and DME orders as soon as possible. SW informed will follow-up once more information. ? ?Cecile Sheerer, MSW, LCSWA ?Office: [phone] ?Cell: [phone] ?Fax: [phone]  ?
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
## 2021-06-01 NOTE — Progress Notes (Signed)
Occupational Therapy Session Note ? ?Patient Details  ?Name: Devin Roberts ?MRN: [id number] ?Date of Birth: 1961/01/10 ? ?Today's Date: 06/01/2021 ?OT Individual Time: 1100-1158 ?OT Individual Time Calculation (min): 58 min  ? ? ?Short Term Goals: ?Week 1:  OT Short Term Goal 1 (Week 1): Patient will complete sit to stand transfers with CGA and LRAD. ?OT Short Term Goal 2 (Week 1): Patient will complete toilet transfers with CGA and LRAD. ?OT Short Term Goal 3 (Week 1): Patient will don LB clothing with CGA, AE and LRAD. ? ?Skilled Therapeutic Interventions/Progress Updates:  ?  OT intervention with focus on sit<>stand standing balance, w/c mobility, discharge planning, and safety awareness to increase independence with BADLs. Sit<>stand with CGA. Stand pivot transfers with CGA. Pt stood at [facility] for various activities challenging balance while reaching outiside BOS-CGA. Demonstrated BSC. W/c mobility with supervison for approx 25'. W/c propulsion aggravates Rt shoulder. Pt returned to room and transferred to EOB with CGA. Sit>supine with supervision. Ice applied to Rt shoulder. Pt remained in bed with all needs within reach and bed alarm activated.  ? ?Therapy Documentation ?Precautions:  ?Precautions ?Precautions: Back, Fall ?Precaution Booklet Issued: No (Verbal re-education proved) ?Required Braces or Orthoses: Spinal Brace, Sling ?Spinal Brace: Thoracolumbosacral orthotic, Applied in supine position ?Restrictions ?Weight Bearing Restrictions: Yes ?RUE Weight Bearing: Weight bearing as tolerated ?LLE Weight Bearing: Non weight bearing ?Other Position/Activity Restrictions: LE ROM to tolerance ? ?Pain: ? Pt c/o 4/10 Lt hip and Rt shoulder pain; repositioned LLE and ice applied to Rt shoulder ? ? ?Therapy/Group: Individual Therapy ? ?[name] ?06/01/2021, 12:27 PM ?
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
## 2021-06-01 NOTE — Progress Notes (Signed)
Occupational Therapy Session Note ? ?Patient Details  ?Name: Devin Roberts ?MRN: [id number] ?Date of Birth: 04-30-1960 ? ?Today's Date: 06/01/2021 ?OT Individual Time: [phone] ?OT Individual Time Calculation (min): 73 min  ? ? ?Short Term Goals: ?Week 1:  OT Short Term Goal 1 (Week 1): Patient will complete sit to stand transfers with CGA and LRAD. ?OT Short Term Goal 2 (Week 1): Patient will complete toilet transfers with CGA and LRAD. ?OT Short Term Goal 3 (Week 1): Patient will don LB clothing with CGA, AE and LRAD. ? ?Skilled Therapeutic Interventions/Progress Updates:  ?  Pt resting in bed upon arrival with wife present. Pt already bathed and dressed with exeption of shirt. Pt sat EOB with supervision. Stand pivot transfer to w/c with supervision. Pt practiced bed mobility on ADL bed with CGA. Discussed shower and home setup. Discussed DME needs. Pt practiced sit<>stand X 5 with CGA. Sit<>stand from standard chair. DME needs include shower seat with arm rests and BSC. Demonstrated vacuum grab bars and discussed placement. Pt returned to room and remained in w/c with all needs within reach. ? ?Therapy Documentation ?Precautions:  ?Precautions ?Precautions: Back, Fall ?Precaution Booklet Issued: No (Verbal re-education proved) ?Required Braces or Orthoses: Spinal Brace, Sling ?Spinal Brace: Thoracolumbosacral orthotic, Applied in supine position ?Restrictions ?Weight Bearing Restrictions: Yes ?RUE Weight Bearing: Weight bearing as tolerated ?LLE Weight Bearing: Non weight bearing ?Other Position/Activity Restrictions: LE ROM to tolerance ?General: ?  ?Vital Signs: ?Therapy Vitals ?Temp: 98.4 ?F (36.9 ?C) ?Temp Source: Oral ?Pulse Rate: 74 ?Resp: 18 ?BP: (!) 126/94 ?Patient Position (if appropriate): Lying ?Oxygen Therapy ?SpO2: 99 % ?O2 Device: Room Air ?Pain: ?Pain Assessment ?Pain Scale: 0-10 ?Pain Score: 6  ?Pain Type: Surgical pain ?Pain Orientation: Right;Medial ?Pain Descriptors / Indicators: Aching ?Pain  Frequency: Constant ?Patients Stated Pain Goal: 2 ?Pain Intervention(s): Medication (See eMAR) ?ADL: ?ADL ?Eating: Independent ?Grooming: Setup ?Where Assessed-Grooming: Edge of bed ?Upper Body Bathing: Minimal assistance ?Where Assessed-Upper Body Bathing: Edge of bed ?Lower Body Bathing: Minimal assistance ?Where Assessed-Lower Body Bathing: Edge of bed ?Upper Body Dressing: Minimal assistance ?Where Assessed-Upper Body Dressing: Edge of bed ?Lower Body Dressing: Moderate assistance ?Where Assessed-Lower Body Dressing: Edge of bed ?Toileting: Not assessed ?Toilet Transfer Method: Not assessed ?Walk-In Shower Transfer: Not assessed ?ADL Comments: Cues for activity pacing, adherence to spinal precautions and adhernece to LLE NWB precautions. ?Vision ?  ?Perception  ?  ?Praxis ?  ?Balance ?  ?Exercises: ?  ?Other Treatments:   ? ? ?Therapy/Group: Individual Therapy ? ?[name] ?06/01/2021, 8:19 AM ?

## 2021-06-01 NOTE — Progress Notes (Signed)
Physical Therapy Session Note ? ?Patient Details  ?Name: Devin Roberts ?MRN: [id number] ?Date of Birth: 1960/09/25 ? ?Today's Date: 06/01/2021 ?PT Individual Time: 0910-1005 ?PT Individual Time Calculation (min): 55 min  ? ?Short Term Goals: ?Week 1:  PT Short Term Goal 1 (Week 1): squat pivot w/set up assist only ?PT Short Term Goal 2 (Week 1): wc propulsion x 64f w/cues, additional time. ?PT Short Term Goal 3 (Week 1): Gait 478fw/PFRW NWB RLE w/cga ?PT Short Term Goal 4 (Week 1): mod I bed mobility ? ?Skilled Therapeutic Interventions/Progress Updates: Pt presented in w/c agreeable to therapy. Pt states UE pain minimal and LLE 5/10 with pt premedicated. Rest breaks provided as needed. PTA discussed current DME needs and sent message to [employer] with info (18x 18 with ELR and R PFRW). PTA also demonstrated placement of BSC over standard toilet as pt indicated used standard toilet last night. Pt then propelled to day room with supervision and PTA provided education on locking brakes, locking/unlocking leg rests. Pt then performed ambulatory transfer to mat with pt cued to hop and allow clearance when turning vs "shimmying" heel toe shift. Performed block practice Sit to stand x 5 with cues for controlled descent. Pt then participated in ambulation [id number] CGA overall. Pt demonstrates overall good safety but required cues for improved clearance. Pt did require increased time between bouts due to fatigue. Pt then performed ambulatory transfer to w/c and propelled back to room. Performed ambulatory transfer to recliner with PFRW and CGA overall. Pt left in recliner at end of session with seat alarm on, call bell within reach and needs met.  ?   ? ?Therapy Documentation ?Precautions:  ?Precautions ?Precautions: Back, Fall ?Precaution Booklet Issued: No (Verbal re-education proved) ?Required Braces or Orthoses: Spinal Brace, Sling ?Spinal Brace: Thoracolumbosacral orthotic, Applied in supine  position ?Restrictions ?Weight Bearing Restrictions: Yes ?RUE Weight Bearing: Weight bearing as tolerated ?LLE Weight Bearing: Non weight bearing ?Other Position/Activity Restrictions: LE ROM to tolerance ?General: ?  ?Vital Signs: ?Therapy Vitals ?Temp: 98.2 ?F (36.8 ?C) ?Pulse Rate: 83 ?Resp: 20 ?BP: 121/60 ?Patient Position (if appropriate): Lying ?Oxygen Therapy ?SpO2: 94 % ?O2 Device: Room Air ?Pain: ?Pain Assessment ?Pain Score: 0-No pain ?Mobility: ?  ?Locomotion : ?   ?Trunk/Postural Assessment : ?   ?Balance: ?  ?Exercises: ?  ?Other Treatments:   ? ? ? ?Therapy/Group: Individual Therapy ? ?[name] ?06/01/2021, 4:21 PM  ?

## 2021-06-01 NOTE — Progress Notes (Signed)
[facility] ?Individual Statement of Services ? ?Patient Name:  Devin Roberts  ?Date:  06/01/2021 ? ?Welcome to the [facility].  Our goal is to provide you with an individualized program based on your diagnosis and situation, designed to meet your specific needs.  With this comprehensive rehabilitation program, you will be expected to participate in at least 3 hours of rehabilitation therapies Monday-Friday, with modified therapy programming on the weekends. ? ?Your rehabilitation program will include the following services:   ?Physical Therapy (PT), Occupational Therapy (OT), 24 hour per day rehabilitation nursing, Therapeutic Recreaction (TR), Psychology, Neuropsychology, Care Coordinator, Rehabilitation Medicine, Nutrition Services, Pharmacy Services, and Other ? ?Weekly team conferences will be held on Tuesdays to discuss your progress.  Your Inpatient Rehabilitation Care Coordinator will talk with you frequently to get your input and to update you on team discussions.  Team conferences with you and your family in attendance may also be held. ? ?Expected length of stay: 10-12 days   ? ?Overall anticipated outcome: Independent with Assistive Device to Supervision ? ?Depending on your progress and recovery, your program may change. Your Inpatient Rehabilitation Care Coordinator will coordinate services and will keep you informed of any changes. Your Inpatient Rehabilitation Care Coordinator's name and contact numbers are listed  below. ? ?The following services may also be recommended but are not provided by the [facility]:  ?Driving Evaluations ?Home Health Rehabiltiation Services ?Outpatient Rehabilitation Services ?Vocational Rehabilitation ?  ?Arrangements will be made to provide these services after discharge if needed.  Arrangements include referral to agencies that provide these services. ? ?Your insurance has been verified to be:  [employer] ? ?Your primary doctor is:  Sanda Linger ? ?Pertinent information will be shared with your doctor and your insurance company. ? ?Inpatient Rehabilitation Care Coordinator:  Susie Cassette [phone] or (C) [phone] ? ?Information discussed with and copy given to patient by: Gretchen Short, 06/01/2021, 11:52 AM    ?

## 2021-06-01 NOTE — Plan of Care (Signed)
?  Problem: Consults ?Goal: RH SPINAL CORD INJURY PATIENT EDUCATION ?Description:  See Patient Education module for education specifics.  ?Outcome: Progressing ?Goal: Skin Care Protocol Initiated - if Braden Score 18 or less ?Description: If consults are not indicated, leave blank or document N/A ?Outcome: Progressing ?Goal: Diabetes Guidelines if Diabetic/Glucose > 140 ?Description: If diabetic or lab glucose is > 140 mg/dl - Initiate Diabetes/Hyperglycemia Guidelines & Document Interventions  ?Outcome: Progressing ?  ?Problem: SCI BOWEL ELIMINATION ?Goal: RH STG MANAGE BOWEL WITH ASSISTANCE ?Description: STG Manage Bowel with Mod I Assistance. ?Outcome: Progressing ?Goal: RH STG SCI MANAGE BOWEL WITH MEDICATION WITH ASSISTANCE ?Description: STG SCI Manage bowel with medication with Mod I assistance. ?Outcome: Progressing ?  ?Problem: RH SKIN INTEGRITY ?Goal: RH STG MAINTAIN SKIN INTEGRITY WITH ASSISTANCE ?Description: STG Maintain Skin Integrity With Mod I Assistance. ?Outcome: Progressing ?Goal: RH STG ABLE TO PERFORM INCISION/WOUND CARE W/ASSISTANCE ?Description: STG Able To Perform Incision/Wound Care With [facility]. ?Outcome: Progressing ?  ?Problem: RH SAFETY ?Goal: RH STG ADHERE TO SAFETY PRECAUTIONS W/ASSISTANCE/DEVICE ?Description: STG Adhere to Safety Precautions With Cues and Reminders. ?Outcome: Progressing ?Goal: RH STG DECREASED RISK OF FALL WITH ASSISTANCE ?Description: STG Decreased Risk of Fall With Mod I Assistance. ?Outcome: Progressing ?  ?Problem: RH PAIN MANAGEMENT ?Goal: RH STG PAIN MANAGED AT OR BELOW PT'S PAIN GOAL ?Description: < 3 on a 0-10 pain scale. ?Outcome: Progressing ?  ?Problem: RH KNOWLEDGE DEFICIT SCI ?Goal: RH STG INCREASE KNOWLEDGE OF SELF CARE AFTER SCI ?Description: Patient will demonstrate knowledge of self-care, pain management, skin/wound care, and weight bearing precautions with educational materials and handouts provided by staff independently at  discharge. ?Outcome: Progressing ?  ?

## 2021-06-02 LAB — GLUCOSE, CAPILLARY
Glucose-Capillary: 131 mg/dL — ABNORMAL HIGH (ref 70–99)
Glucose-Capillary: 126 mg/dL — ABNORMAL HIGH (ref 70–99)
Glucose-Capillary: 97 mg/dL (ref 70–99)
Glucose-Capillary: 112 mg/dL — ABNORMAL HIGH (ref 70–99)

## 2021-06-02 LAB — URINE CULTURE: Culture: NO GROWTH

## 2021-06-02 MED ORDER — CLONAZEPAM 0.125 MG PO TBDP
0.1250 mg | ORAL_TABLET | Freq: Two times a day (BID) | ORAL | Status: DC | PRN
  Administered 2021-06-02 – 2021-06-06 (×10): 0.125 mg via ORAL
  Filled 2021-06-02 (×10): qty 1

## 2021-06-02 NOTE — Progress Notes (Signed)
Patient up in high back chair. C/O increased anxiety and uneasiness. Requested to see Dr. Dalene Carrow this am for some anxiety medication. Patient also requests PRN Oxy 5 mg PRN Q 4 hours for right shoulder pain and LLE pain. OXY effective. New orders PRN Clonazepam 0.125 mg BID PRN. PRN Clonazepam 0.125 MG PRN given at 1010, somewhat effective/decreased anxiety. Call light and personal items within reach.   ?

## 2021-06-03 DIAGNOSIS — T07XXXA Unspecified multiple injuries, initial encounter: Secondary | ICD-10-CM | POA: Diagnosis not present

## 2021-06-03 LAB — GLUCOSE, CAPILLARY
Glucose-Capillary: 155 mg/dL — ABNORMAL HIGH (ref 70–99)
Glucose-Capillary: 137 mg/dL — ABNORMAL HIGH (ref 70–99)
Glucose-Capillary: 122 mg/dL — ABNORMAL HIGH (ref 70–99)
Glucose-Capillary: 137 mg/dL — ABNORMAL HIGH (ref 70–99)

## 2021-06-03 MED ORDER — PAROXETINE HCL 20 MG PO TABS
20.0000 mg | ORAL_TABLET | Freq: Every day | ORAL | Status: DC
  Administered 2021-06-03 – 2021-06-06 (×4): 20 mg via ORAL
  Filled 2021-06-03 (×4): qty 1

## 2021-06-03 NOTE — Progress Notes (Signed)
?                                                       PROGRESS NOTE ? ? ?Subjective/Complaints: ?Pt reports got Klonopin this weekend- was helpful- but says it doesn't last the full 12 hours- explained don't want to continue to increase benzo's because of the way they can interact with pain meds-  ?Will try adding something to help prevent anxiety, as compared to treating all the time.  ? ?LBM 2 days ago- got sorbitol already this AM.  ? ?ROS: ? ?Pt denies SOB, abd pain, CP, N/V/C/D, and vision changes ? ? ? ?Objective: ?  ?No results found. ?No results for input(s): WBC, HGB, HCT, PLT in the last 72 hours. ? ?No results for input(s): NA, K, CL, CO2, GLUCOSE, BUN, CREATININE, CALCIUM in the last 72 hours. ? ? ?Intake/Output Summary (Last 24 hours) at 06/03/2021 1428 ?Last data filed at 06/03/2021 1259 ?Gross per 24 hour  ?Intake 840 ml  ?Output 850 ml  ?Net -10 ml  ?  ? ?  ? ?Physical Exam: ?Vital Signs ?Blood pressure 97/60, pulse 89, temperature 98.3 ?F (36.8 ?C), resp. rate 19, height 5\' 8"  (1.727 m), SpO2 97 %. ? ? ? ? ? ?General: awake, alert, appropriate, sitting up in bedside chair; wearing TLSO; NAD ?HENT: conjugate gaze; oropharynx moist ?CV: regular rate; no JVD ?Pulmonary: CTA B/L; no W/R/R- good air movement ?GI: soft, NT, ND, (+)BS- hypoactive ?Psychiatric: appropriate ?Neurological: Ox3 ? ?Musculoskeletal: wearing TLSO- can wiggle toes on LLE with warm toes ?   Comments: LLE splint and surgical dressing in place . Strength and sensation intact. RUE strength and sensation intact ?Skin: ?   General: Skin is warm and dry.  ? ?Assessment/Plan: ?1. Functional deficits which require 3+ hours per day of interdisciplinary therapy in a comprehensive inpatient rehab setting. ?Physiatrist is providing close team supervision and 24 hour management of active medical problems listed below. ?Physiatrist and rehab team continue to assess barriers to discharge/monitor patient progress toward functional and medical  goals ? ?Care Tool: ? ?Bathing ?   ?Body parts bathed by patient: Right arm, Left arm, Chest, Abdomen, Front perineal area, Right upper leg, Left upper leg, Face  ? Body parts bathed by helper: Right lower leg, Buttocks ?Body parts n/a: Left lower leg ?  ?Bathing assist Assist Level: Minimal Assistance - Patient > 75% ?  ?  ?Upper Body Dressing/Undressing ?Upper body dressing   ?What is the patient wearing?: Pull over shirt ?   ?Upper body assist Assist Level: Minimal Assistance - Patient > 75% ?   ?Lower Body Dressing/Undressing ?Lower body dressing ? ? ?   ?What is the patient wearing?: Pants, Underwear/pull up ? ?  ? ?Lower body assist Assist for lower body dressing: Moderate Assistance - Patient 50 - 74% ?   ? ?Toileting ?Toileting Toileting Activity did not occur ( and hygiene only): N/A (no void or bm)  ?Toileting assist Assist for toileting: Moderate Assistance - Patient 50 - 74% ?  ?  ?Transfers ?Chair/bed transfer ? ?Transfers assist ?   ? ?Chair/bed transfer assist level: Contact Guard/Touching assist ?  ?  ?Locomotion ?Ambulation ? ? ?Ambulation assist ? ?   ? ?Assist level: Contact Guard/Touching assist ?Assistive device: Walker-platform ?Max distance: 79'  ? ?Walk  10 feet activity ? ? ?Assist ?   ? ?Assist level: Contact Guard/Touching assist ?Assistive device: Walker-platform  ? ?Walk 50 feet activity ? ? ?Assist   ? ?Assist level: Contact Guard/Touching assist ?Assistive device: Walker-platform  ? ? ?Walk 150 feet activity ? ? ?Assist   ? ?Assist level: Total Assistance - Patient < 25% ?Assistive device: Walker-platform ?  ? ?Walk 10 feet on uneven surface  ?activity ? ? ?Assist Walk 10 feet on uneven surfaces activity did not occur: Safety/medical concerns ? ? ?  ?   ? ?Wheelchair ? ? ? ? ?Assist Is the patient using a wheelchair?: Yes ?Type of Wheelchair: Manual ?  ? ?Wheelchair assist level: Supervision/Verbal cueing ?Max wheelchair distance: 150'  ? ? ?Wheelchair 50 feet with 2  turns activity ? ? ? ?Assist ? ?  ?  ? ? ?Assist Level: Supervision/Verbal cueing  ? ?Wheelchair 150 feet activity  ? ? ? ?Assist ?   ? ? ?Assist Level: Supervision/Verbal cueing  ? ?Blood pressure 97/60, pulse 89, temperature 98.3 ?F (36.8 ?C), resp. rate 19, height 5\' 8"  (1.727 m), SpO2 97 %. ? ?Medical Problem List and Plan: ?1. Functional deficits secondary to polytrauma ?            -patient may shower ?            -ELOS/Goals: S 10-14 days ?            -First day of evaluations- PT and OT- NWB LLE- WBAT RUE- sling for comfort; no ROM restrictions ? Con't CIR- PT and OT ?2.  Antithrombotics: ?-DVT/anticoagulation:  Pharmaceutical: Lovenox ?            -antiplatelet therapy: None (was on aspirin 81 mg daily prior to admission) ?3. Pain Management: Tylenol as needed, Robaxin 3 times daily, oxycodone as needed ?            -ice and elevate LLE ? 3/9- d/w pt- suggest using 5-10 mg in Am before therapy and at lunch- and then at bedtime- can take more, but at least 3x/day for the next few days.  ? 3/12- using ice and pain meds- con't regimen for now ?4. Mood: LCSW to evaluate and provide emotional support ?            -antipsychotic agents: n/a ?5. Neuropsych: This patient is capable of making decisions on his own behalf. ?6. Skin/Wound Care: Routine skin care checks ?            -- Monitor LLE incisions: Sutures out approximately 3/20 ?7. Fluids/Electrolytes/Nutrition: Routine I's and O's and follow-up chemistries ?            --continue carb modified diet ?8: POD 2 ORIF left pilon fracture and syndesmosis, left proximal tibia fracture ?            --NWB times 8 weeks ?            --splint times 2 weeks, then CAM boot (3/20) ?            --unrestricted left knee and hip ROM ?9: right scapular fracture: non-operative repair ?            --sling for comfort ?            --early ROM ?            --can use RUE to help mobilize ?            --may weight bear through  RUE ?10: L1, L2 vertebral body compression fractures ?             --continue TSLO when out of bed ?11: Hyperlipidemia: Pravachol (and Vascepa) on home med list but statin listed as allergy/intolerant>>wife says not allergic and has been tolerating so will restart ?12: Hypertension: continue chlorthalidone 25 mg daily ?            --on Edarbyclor 40/25 at home ? 3/10- DBP a little elevated, but SBP controlled- con't regimen ?13: Hyperglycemia with A1c = 6.5 ?            --Continue CBGs QID and SSI ? 3/12- Cbgs 112-155- con't regimen ?14: Constipation: continue scheduled Colace and PRNs ordered ?            -3/9- LBM 6 days ago- will give Sorbitol 45cc at lunch per pt request-  ? 3/10- large BM last night- on toilet- con't regimen ? 3/12- LBM 2 days ago- took sorbitol already today.  ?15: Insomnia, chronic: takes Ambien 10 mg every night>>will resume ?16. Obesity BMI 33.5: provide dietary education ? 17. Leukocytosis ? 3/10- WBC 13.9- hasn't come down- will recheck Monday- also U/A negative and no Sx's of illness- no fever.  ?18. Anxiety ? 3/12- was given a dose of Klonopin BID prn- don't want pt to have issues esp since also on pain meds- will start Paxil to get a better control of anxiety/prevent Sx's.  ? ? ?I spent a total of 37   minutes on total care today- >50% coordination of care- due to d/w nursing and prolonged d/w pt.  ? ? ?LOS: ?4 days ?A FACE TO FACE EVALUATION WAS PERFORMED ? ?Elisse Pennick ?06/03/2021, 2:28 PM  ? ? ? ?

## 2021-06-03 NOTE — Progress Notes (Signed)
Occupational Therapy Session Note ? ?Patient Details  ?Name: Devin Roberts ?MRN: [id number] ?Date of Birth: 1961/02/12 ? ?Today's Date: 06/03/2021 ?OT Individual Time: 9470-9628 ?OT Individual Time Calculation (min): 58 min  ? ? ?Short Term Goals: ?Week 1:  OT Short Term Goal 1 (Week 1): Patient will complete sit to stand transfers with CGA and LRAD. ?OT Short Term Goal 2 (Week 1): Patient will complete toilet transfers with CGA and LRAD. ?OT Short Term Goal 3 (Week 1): Patient will don LB clothing with CGA, AE and LRAD. ? ?Skilled Therapeutic Interventions/Progress Updates:  ?  Pt resting in bed upon arrival with wife present. Supine>sit EOB with supervision. Min A for donning TLSO. Sit<>stand and amb with PFRW to w/c with CGA. Reviewed measurement sheet and pt's wife shared photos of shower set up and bathroom. Problem solved safest strategy/technique for accessing bathroom. W/c/PFRW will not fit through bathroom door. Pt practiced pulling w/c up to threshhold and standing with PFRW placed inside doorway. Pt also practiced stepping/hopping over 3" step into walk-in shower. Platform adjusted on PFRW to facilitate upright posture when standing/amb. CGA for hopping over 3" rise. Will continue to practice. Pt returned to room and transferred to recliner. All needs within reach. ? ?Therapy Documentation ?Precautions:  ?Precautions ?Precautions: Back, Fall ?Precaution Booklet Issued: No (Verbal re-education proved) ?Required Braces or Orthoses: Spinal Brace, Sling ?Spinal Brace: Thoracolumbosacral orthotic, Applied in supine position ?Restrictions ?Weight Bearing Restrictions: Yes ?RUE Weight Bearing: Weight bearing as tolerated ?LLE Weight Bearing: Non weight bearing ?Other Position/Activity Restrictions: LE ROM to tolerance ? ?Pain: ?Pt reports his Rt shoulder "aches" most of the time; repositioned ? ? ? ?Therapy/Group: Individual Therapy ? ?[name] ?06/03/2021, 8:01 AM ?

## 2021-06-03 NOTE — Progress Notes (Signed)
Physical Therapy Session Note ? ?Patient Details  ?Name: Devin Roberts ?MRN: [id number] ?Date of Birth: 03-10-1961 ? ?Today's Date: 06/03/2021 ?PT Individual Time: 1000-1055; 1615-1700 ?PT Individual Time Calculation (min): 55 min and 45 min ? ?Short Term Goals: ?Week 1:  PT Short Term Goal 1 (Week 1): squat pivot w/set up assist only ?PT Short Term Goal 2 (Week 1): wc propulsion x 12ft w/cues, additional time. ?PT Short Term Goal 3 (Week 1): Gait 73ft w/PFRW NWB RLE w/cga ?PT Short Term Goal 4 (Week 1): mod I bed mobility ? ?Skilled Therapeutic Interventions/Progress Updates:  ?  Session 1: ?Pt received seated in recliner in room, agreeable to PT session. Pt reports 5/10 pain at rest in multiple locations in his body due to injuries. Pt premedicated prior to start of therapy session, declines further intervention. Sit to stand with CGA and PFRW during session. Stand pivot transfers with PFRW and CGA. Ambulation x 65 ft with PFRW and CGA for balance via "hopping" on RLE with good adherence to NWB LLE. Session focus on practicing threshold navigation that pt has to enter his home. Pt reports he will likely have a ramp upon d/c so will not need to navigate 2 stairs, just threshold. Ascend/descend one 1" step progressing to one 4" step with PFRW and min A for balance. Pt does best navigating step backwards for balance and safe LLE management. Progression to navigating up/down one 4" curb step with R PFRW and min A for balance. Pt initially requires assist for RW management up/down curb step, progresses to just needing min A to maintain balance while he manages RW safely. Manual w/c propulsion x 150 ft with use of BUE at Supervision level with some increase in R shoulder pain with mobility. Pt reports feeling fatigued at end of session. Pt returned to recliner, left seated in chair in room with needs in reach at end of session. ? ?Session 2: ?Pt received seated in bed, agreeable to PT session. Pt reports 5/10 pain in  multiple locations, premedicated prior to start of therapy session and utilizes ice pack to R shoulder at end of session for pain management. Bed mobility Supervision. Pt is setup A to don TLSO while seated EOB. Sit to stand and stand pivot transfers with PFRW and CGA throughout session, good adherence to WB precautions. Pt concerned about navigating stairs at entrance to his home in case he cannot have a ramp built in time. Per picture and measurements provided by pt's wife he should be able to hop up backwards onto first step (8") and then can sit down in w/c on his porch without having to navigate 2nd step. Pt able to hop up backwards on 6" step x 4 reps with PFRW and then progresses to navigating up/down 8" step backwards with PFRW 2 x 4 reps with good LE clearance. Pt encouraged by progress with step navigation this date. Pt taken to rehab apartment to simulate various transfers in functional environment. Sit to/from supine on real bed at Supervision level. Sit to stand from low, pliable surfaces such as recliner and couch to PFRW with CGA. Static standing balance performing horseshoe toss with one UE on RW and CGA for balance reaching for horseshoes across midline and outside BOS. Pt returned to recliner at end of session, needs in reach, ice pack to R shoulder. ? ?Therapy Documentation ?Precautions:  ?Precautions ?Precautions: Back, Fall ?Precaution Booklet Issued: No (Verbal re-education proved) ?Required Braces or Orthoses: Spinal Brace, Sling ?Spinal Brace: Thoracolumbosacral orthotic,  Applied in supine position ?Restrictions ?Weight Bearing Restrictions: Yes ?RUE Weight Bearing: Weight bearing as tolerated ?LLE Weight Bearing: Non weight bearing ?Other Position/Activity Restrictions: LE ROM to tolerance ? ? ? ? ? ?Therapy/Group: Individual Therapy ? ? ?Peter Congo, PT, DPT, CSRS ? ?06/03/2021, 12:19 PM  ?

## 2021-06-03 NOTE — Progress Notes (Signed)
Occupational Therapy Session Note ? ?Patient Details  ?Name: Devin Roberts ?MRN: [id number] ?Date of Birth: Nov 05, 1960 ? ?Today's Date: 06/03/2021 ?OT Individual Time: 1330-1400 ?OT Individual Time Calculation (min): 30 min  ? ? ?Short Term Goals: ?Week 1:  OT Short Term Goal 1 (Week 1): Patient will complete sit to stand transfers with CGA and LRAD. ?OT Short Term Goal 2 (Week 1): Patient will complete toilet transfers with CGA and LRAD. ?OT Short Term Goal 3 (Week 1): Patient will don LB clothing with CGA, AE and LRAD. ? ?Skilled Therapeutic Interventions/Progress Updates:  ?  OT intervention with focus on bed mobility and walk-in shower tranfsers. All bed mobility with supervision. Shower transers X 2 with CGA after demonstration. Pt able to pivot to sit on shower chair with CGA. Discussed placement of grab bars. Pt returned to room and transferred to bed. Pt remained in bed with all needs within reach. Wife present. ? ?Therapy Documentation ?Precautions:  ?Precautions ?Precautions: Back, Fall ?Precaution Booklet Issued: No (Verbal re-education proved) ?Required Braces or Orthoses: Spinal Brace, Sling ?Spinal Brace: Thoracolumbosacral orthotic, Applied in supine position ?Restrictions ?Weight Bearing Restrictions: Yes ?RUE Weight Bearing: Weight bearing as tolerated ?LLE Weight Bearing: Non weight bearing ?Other Position/Activity Restrictions: LE ROM to tolerance ?Pain: ?Pt denies pain this afternoon ? ? ?Therapy/Group: Individual Therapy ? ?[name] ?06/03/2021, 2:16 PM ?

## 2021-06-04 DIAGNOSIS — T07XXXA Unspecified multiple injuries, initial encounter: Secondary | ICD-10-CM | POA: Diagnosis not present

## 2021-06-04 LAB — GLUCOSE, CAPILLARY
Glucose-Capillary: 123 mg/dL — ABNORMAL HIGH (ref 70–99)
Glucose-Capillary: 125 mg/dL — ABNORMAL HIGH (ref 70–99)
Glucose-Capillary: 139 mg/dL — ABNORMAL HIGH (ref 70–99)
Glucose-Capillary: 102 mg/dL — ABNORMAL HIGH (ref 70–99)

## 2021-06-04 LAB — CBC WITH DIFFERENTIAL/PLATELET
Abs Immature Granulocytes: 0.08 10*3/uL — ABNORMAL HIGH (ref 0.00–0.07)
Basophils Absolute: 0.1 10*3/uL (ref 0.0–0.1)
Basophils Relative: 1 %
Eosinophils Absolute: 0.2 10*3/uL (ref 0.0–0.5)
Eosinophils Relative: 2 %
HCT: 36.4 % — ABNORMAL LOW (ref 39.0–52.0)
Hemoglobin: 12.6 g/dL — ABNORMAL LOW (ref 13.0–17.0)
Immature Granulocytes: 1 %
Lymphocytes Relative: 19 %
Lymphs Abs: 2.1 10*3/uL (ref 0.7–4.0)
MCH: 33 pg (ref 26.0–34.0)
MCHC: 34.6 g/dL (ref 30.0–36.0)
MCV: 95.3 fL (ref 80.0–100.0)
Monocytes Absolute: 1 10*3/uL (ref 0.1–1.0)
Monocytes Relative: 10 %
Neutro Abs: 7.3 10*3/uL (ref 1.7–7.7)
Neutrophils Relative %: 67 %
Platelets: 294 10*3/uL (ref 150–400)
RBC: 3.82 MIL/uL — ABNORMAL LOW (ref 4.22–5.81)
RDW: 12.6 % (ref 11.5–15.5)
WBC: 10.8 10*3/uL — ABNORMAL HIGH (ref 4.0–10.5)
nRBC: 0 % (ref 0.0–0.2)

## 2021-06-04 LAB — BASIC METABOLIC PANEL
Anion gap: 7 (ref 5–15)
BUN: 21 mg/dL — ABNORMAL HIGH (ref 6–20)
CO2: 31 mmol/L (ref 22–32)
Calcium: 9.2 mg/dL (ref 8.9–10.3)
Chloride: 97 mmol/L — ABNORMAL LOW (ref 98–111)
Creatinine, Ser: 1.04 mg/dL (ref 0.61–1.24)
GFR, Estimated: >60 mL/min (ref >60)
Glucose, Bld: 127 mg/dL — ABNORMAL HIGH (ref 70–99)
Potassium: 3.9 mmol/L (ref 3.5–5.1)
Sodium: 135 mmol/L (ref 135–145)

## 2021-06-04 MED ORDER — METFORMIN HCL 500 MG PO TABS
500.0000 mg | ORAL_TABLET | Freq: Two times a day (BID) | ORAL | Status: DC
  Administered 2021-06-04 – 2021-06-07 (×6): 500 mg via ORAL
  Filled 2021-06-04 (×7): qty 1

## 2021-06-04 NOTE — Progress Notes (Signed)
Occupational Therapy Session Note ? ?Patient Details  ?Name: Devin Roberts ?MRN: [id number] ?Date of Birth: 07-07-1960 ? ?Today's Date: 06/04/2021 ?OT Individual Time: 1130-1200 ?OT Individual Time Calculation (min): 30 min  ? ? ?Short Term Goals: ?Week 1:  OT Short Term Goal 1 (Week 1): Patient will complete sit to stand transfers with CGA and LRAD. ?OT Short Term Goal 2 (Week 1): Patient will complete toilet transfers with CGA and LRAD. ?OT Short Term Goal 3 (Week 1): Patient will don LB clothing with CGA, AE and LRAD. ? ?Skilled Therapeutic Interventions/Progress Updates:  ?  Pt resting in recliner upon arrival. OT intervention with focus on functional amb with PFRW in room and into bathroom. Pt practiced sit<>stand from TTB in shower in preparation for shower tomorrow morning. Discussed sequencing/process for shower tomorrow morning. Informed pt that his LLE will be covered with two (2) trash bags and taped. Pt stated that his wife will probably be purchasing a shower sleeve. Pt returned to room and sat in recliner. All amb and sit<>stand with CGA. Pt remained seated in relciner. All needs within reach.  ? ?Therapy Documentation ?Precautions:  ?Precautions ?Precautions: Back, Fall ?Precaution Booklet Issued: No (Verbal re-education proved) ?Required Braces or Orthoses: Spinal Brace, Sling ?Spinal Brace: Thoracolumbosacral orthotic, Applied in supine position ?Restrictions ?Weight Bearing Restrictions: Yes ?RUE Weight Bearing: Weight bearing as tolerated ?LLE Weight Bearing: Non weight bearing ?Other Position/Activity Restrictions: LE ROM to tolerance ?Pain: ?Pt reports increased pain in Rt shoulder from morning activity; repositioned ? ?Therapy/Group: Individual Therapy ? ?[name] ?06/04/2021, 12:25 PM ?

## 2021-06-04 NOTE — Progress Notes (Signed)
Physical Therapy Session Note ? ?Patient Details  ?Name: Devin Roberts ?MRN: [id number] ?Date of Birth: 07-13-1960 ? ?Today's Date: 06/04/2021 ?PT Individual Time: 5732-2025 ?PT Individual Time Calculation (min): 75 min  ? ?Short Term Goals: ?Week 1:  PT Short Term Goal 1 (Week 1): squat pivot w/set up assist only ?PT Short Term Goal 2 (Week 1): wc propulsion x 35ft w/cues, additional time. ?PT Short Term Goal 3 (Week 1): Gait 67ft w/PFRW NWB RLE w/cga ?PT Short Term Goal 4 (Week 1): mod I bed mobility ? ? ?Skilled Therapeutic Interventions/Progress Updates:  ?Pt was received sitting upright in armed chair with BLE's elevated. Pt was agreeable to therapy. He performed a STS to R PFRW with supervision. After gait belt was donned, pt attempted to ambulate to therapy gym with PFRW CGA but requested to discontinue after 4ft due to fatigue and trying to conserve energy for balance activities. Pt was then transported rest of the way to therapy gym. He performed W/C to mat table transfer with PFRW CGA. PT demonstrated activity utilizing Rebounder before instructing pt. Pt performed ball toss at Rebounder, first in sitting with supervision to assess balance. Then, pt performed STS with PFRW and continued the ball toss activity in SLS on the RLE CGA. Pt demonstrated occasional LOB and was instructed to let the ball go if it became out of reach to catch. Pt performed standing 3-way leg raises on the RLE for 2 sets of 15 in each direction, requiring CGA and verbal cuing to keep the moving LE as extended as possible. Pt then performed stand > sit with PFRW CGA onto mat table due to fatigue. Performed LAQs bilaterally, then went sitting > supine to perform supine leg lifts and modified glute bridges with bolster on the LLE only. All exercises performed with verbal cuing for isometric hold and slow eccentric lowering. Pt received education on how to perform these exercises in his bed and at home. Pt performed supine > sitting EOB  with supervision, then STS > PFRW CGA. Pt agreed to attempt ambulation back to his room and performed with CGA for 61ft, then requested to be transported back in w/c due to fatigue and R shoulder pain. Pt reports pain that is greater in his R shoulder than in his hip, not rated.   ?Pt performed transfer from w/c > PFRW > armed chair with CGA and ice pack was refilled to address shoulder pain. He was left with all needs in reach. ? ?Therapy Documentation ?Precautions:  ?Precautions ?Precautions: Back, Fall ?Precaution Booklet Issued: No (Verbal re-education proved) ?Required Braces or Orthoses: Spinal Brace, Sling ?Spinal Brace: Thoracolumbosacral orthotic, Applied in supine position ?Restrictions ?Weight Bearing Restrictions: Yes ?RUE Weight Bearing: Weight bearing as tolerated ?LLE Weight Bearing: Non weight bearing ?Other Position/Activity Restrictions: LE ROM to tolerance ? ? ? ?Therapy/Group: Individual Therapy ?Charmian Muff SPT ? ?06/04/2021, 6:10 PM  ?

## 2021-06-04 NOTE — Progress Notes (Signed)
Occupational Therapy Session Note ? ?Patient Details  ?Name: Devin Roberts ?MRN: [id number] ?Date of Birth: [birth year] ? ?Today's Date: 06/04/2021 ?OT Individual Time: [id number] ?OT Individual Time Calculation (min): 72 min  ? ? ?Short Term Goals: ?Week 1:  OT Short Term Goal 1 (Week 1): Patient will complete sit to stand transfers with CGA and LRAD. ?OT Short Term Goal 2 (Week 1): Patient will complete toilet transfers with CGA and LRAD. ?OT Short Term Goal 3 (Week 1): Patient will don LB clothing with CGA, AE and LRAD. ? ?Skilled Therapeutic Interventions/Progress Updates:  ?  Pt resting in bed upon arrival. OT intervention with focus on bed mobility, sit<>stand, standing balance, functional amb with PFRW, discharge planning, and safety awareness to increase independence with BADLs. Supine>sit EOB with supervision. Pt required min A for donning TLSO this morning. Sit<>stand and amb with RW to w/c-CGA. Standing balance activities included Wii bowling with rest breaks X1 and CGA for balance, standing to place/remove clothes pins on basketball net, and standing to clean clothes pins using BUE. All tasks with CGA. Pt amb with PFRW through cone obstacle course with CGA. Discussed taking shower tomorrow and pt agreeable. Pt returned to room and amb from hallway to recliner with CGA. Pt remained in recliner with all needs within reach.  ? ?Therapy Documentation ?Precautions:  ?Precautions ?Precautions: Back, Fall ?Precaution Booklet Issued: No (Verbal re-education proved) ?Required Braces or Orthoses: Spinal Brace, Sling ?Spinal Brace: Thoracolumbosacral orthotic, Applied in supine position ?Restrictions ?Weight Bearing Restrictions: Yes ?RUE Weight Bearing: Weight bearing as tolerated ?LLE Weight Bearing: Non weight bearing ?Other Position/Activity Restrictions: LE ROM to tolerance ?Pain: ?Pt reports his Rt shoulder is "a little stiff and sore", repositioned ? ? ?Therapy/Group: Individual Therapy ? ?Leroy Libman ?06/04/2021, 8:13 AM ?

## 2021-06-04 NOTE — Progress Notes (Signed)
Patient ID: Devin Roberts, male   DOB: 1961-01-21, 61 y.o.   MRN: [id number] ? ?SW sent clinicals and DME order with Outpatient therapy PT/OT order to [facility] CM with [facility]. SW faxed orders to [facility] [phone]). ? ?Cecile Sheerer, MSW, LCSWA ?Office: [phone] ?Cell: [phone] ?Fax: [phone]  ?

## 2021-06-04 NOTE — Progress Notes (Signed)
?                                                       PROGRESS NOTE ? ? ?Subjective/Complaints: ? ?Pt had good BM yesterday after sorbitol.  ?Shower tomorrow planned.  ?No issues- working on therapy right now. Per OTA, his brace looks like it rides up when sitting, but fits perfect standing.  ? ?ROS: ? ?Pt denies SOB, abd pain, CP, N/V/C/D, and vision changes ? ? ? ?Objective: ?  ?No results found. ?Recent Labs  ?  06/04/21 ?0522  ?WBC 10.8*  ?HGB 12.6*  ?HCT 36.4*  ?PLT 294  ? ? ?Recent Labs  ?  06/04/21 ?0522  ?NA 135  ?K 3.9  ?CL 97*  ?CO2 31  ?GLUCOSE 127*  ?BUN 21*  ?CREATININE 1.04  ?CALCIUM 9.2  ? ? ? ?Intake/Output Summary (Last 24 hours) at 06/04/2021 0837 ?Last data filed at 06/04/2021 0543 ?Gross per 24 hour  ?Intake 960 ml  ?Output 1350 ml  ?Net -390 ml  ?  ? ?  ? ?Physical Exam: ?Vital Signs ?Blood pressure 130/81, pulse 73, temperature 98.2 ?F (36.8 ?C), resp. rate 18, height 5\' 8"  (1.727 m), SpO2 98 %. ? ? ? ? ? ? ?General: awake, alert, appropriate, sitting on mat in gym with OTA; NAD ?HENT: conjugate gaze; oropharynx moist ?CV: regular rate; no JVD ?Pulmonary: CTA B/L; no W/R/R- good air movement ?GI: soft, NT, ND, (+)BS ?Psychiatric: appropriate ?Neurological: Ox3 ?Musculoskeletal: wearing TLSO- can wiggle toes on LLE with warm toes ?   Comments: LLE splint and surgical dressing in place . Strength and sensation intact. RUE strength and sensation intact ?Skin: ?   General: Skin is warm and dry.  ? ?Assessment/Plan: ?1. Functional deficits which require 3+ hours per day of interdisciplinary therapy in a comprehensive inpatient rehab setting. ?Physiatrist is providing close team supervision and 24 hour management of active medical problems listed below. ?Physiatrist and rehab team continue to assess barriers to discharge/monitor patient progress toward functional and medical goals ? ?Care Tool: ? ?Bathing ?   ?Body parts bathed by patient: Right arm, Left arm, Chest, Abdomen, Front perineal area,  Right upper leg, Left upper leg, Face  ? Body parts bathed by helper: Right lower leg, Buttocks ?Body parts n/a: Left lower leg ?  ?Bathing assist Assist Level: Minimal Assistance - Patient > 75% ?  ?  ?Upper Body Dressing/Undressing ?Upper body dressing   ?What is the patient wearing?: Pull over shirt ?   ?Upper body assist Assist Level: Minimal Assistance - Patient > 75% ?   ?Lower Body Dressing/Undressing ?Lower body dressing ? ? ?   ?What is the patient wearing?: Pants, Underwear/pull up ? ?  ? ?Lower body assist Assist for lower body dressing: Moderate Assistance - Patient 50 - 74% ?   ? ?Toileting ?Toileting Toileting Activity did not occur ( and hygiene only): N/A (no void or bm)  ?Toileting assist Assist for toileting: Moderate Assistance - Patient 50 - 74% ?  ?  ?Transfers ?Chair/bed transfer ? ?Transfers assist ?   ? ?Chair/bed transfer assist level: Contact Guard/Touching assist ?  ?  ?Locomotion ?Ambulation ? ? ?Ambulation assist ? ?   ? ?Assist level: Contact Guard/Touching assist ?Assistive device: Walker-platform ?Max distance: 48'  ? ?Walk 10 feet activity ? ? ?  Assist ?   ? ?Assist level: Contact Guard/Touching assist ?Assistive device: Walker-platform  ? ?Walk 50 feet activity ? ? ?Assist   ? ?Assist level: Contact Guard/Touching assist ?Assistive device: Walker-platform  ? ? ?Walk 150 feet activity ? ? ?Assist   ? ?Assist level: Total Assistance - Patient < 25% ?Assistive device: Walker-platform ?  ? ?Walk 10 feet on uneven surface  ?activity ? ? ?Assist Walk 10 feet on uneven surfaces activity did not occur: Safety/medical concerns ? ? ?  ?   ? ?Wheelchair ? ? ? ? ?Assist Is the patient using a wheelchair?: Yes ?Type of Wheelchair: Manual ?  ? ?Wheelchair assist level: Supervision/Verbal cueing ?Max wheelchair distance: 150'  ? ? ?Wheelchair 50 feet with 2 turns activity ? ? ? ?Assist ? ?  ?  ? ? ?Assist Level: Supervision/Verbal cueing  ? ?Wheelchair 150 feet activity   ? ? ? ?Assist ?   ? ? ?Assist Level: Supervision/Verbal cueing  ? ?Blood pressure 130/81, pulse 73, temperature 98.2 ?F (36.8 ?C), resp. rate 18, height 5\' 8"  (1.727 m), SpO2 98 %. ? ?Medical Problem List and Plan: ?1. Functional deficits secondary to polytrauma ?            -patient may shower ?            -ELOS/Goals: S 10-14 days ?            -First day of evaluations- PT and OT- NWB LLE- WBAT RUE- sling for comfort; no ROM restrictions ? Con't CPT and OT- CIR- team conference tomorrow to determine length of stay ?2.  Antithrombotics: ?-DVT/anticoagulation:  Pharmaceutical: Lovenox ?            -antiplatelet therapy: None (was on aspirin 81 mg daily prior to admission) ?3. Pain Management: Tylenol as needed, Robaxin 3 times daily, oxycodone as needed ?            -ice and elevate LLE ? 3/9- d/w pt- suggest using 5-10 mg in Am before therapy and at lunch- and then at bedtime- can take more, but at least 3x/day for the next few days.  ? 3/13- pain controlled- con't regimen ? ?4. Mood: LCSW to evaluate and provide emotional support ?            -antipsychotic agents: n/a ?5. Neuropsych: This patient is capable of making decisions on his own behalf. ?6. Skin/Wound Care: Routine skin care checks ?            -- Monitor LLE incisions: Sutures out approximately 3/20 ?7. Fluids/Electrolytes/Nutrition: Routine I's and O's and follow-up chemistries ?            --continue carb modified diet ?8: POD 2 ORIF left pilon fracture and syndesmosis, left proximal tibia fracture ?            --NWB times 8 weeks ?            --splint times 2 weeks, then CAM boot (3/20) ?            --unrestricted left knee and hip ROM ?9: right scapular fracture: non-operative repair ?            --sling for comfort ?            --early ROM ?            --can use RUE to help mobilize ?            --may weight bear through RUE ?  10: L1, L2 vertebral body compression fractures ?            --continue TSLO when out of bed ?11: Hyperlipidemia: Pravachol  (and Vascepa) on home med list but statin listed as allergy/intolerant>>wife says not allergic and has been tolerating so will restart ?12: Hypertension: continue chlorthalidone 25 mg daily ?            --on Edarbyclor 40/25 at home ? 3/10- DBP a little elevated, but SBP controlled- con't regimen ?13: Hyperglycemia with A1c = 6.5 ?            --Continue CBGs QID and SSI ? 3/12- Cbgs 112-155- con't regimen ? 3/13- on no DM meds- will try metformin 500 mg BID with meals- to see if can get better BG control ?14: Constipation: continue scheduled Colace and PRNs ordered ?            -3/9- LBM 6 days ago- will give Sorbitol 45cc at lunch per pt request-  ? 3/10- large BM last night- on toilet- con't regimen ? 3/12- LBM 2 days ago- took sorbitol already today.  ? 3/13- LBM yesterday with sorbitol ?15: Insomnia, chronic: takes Ambien 10 mg every night>>will resume ?16. Obesity BMI 33.5: provide dietary education ? 17. Leukocytosis ? 3/10- WBC 13.9- hasn't come down- will recheck Monday- also U/A negative and no Sx's of illness- no fever.  ? 3/13- WBC down to 10.8- will con't to monitor ?18. Anxiety ? 3/12- was given a dose of Klonopin BID prn- don't want pt to have issues esp since also on pain meds- will start Paxil to get a better control of anxiety/prevent Sx's.  ? ? ?I spent a total of  35  minutes on total care today- >50% coordination of care- due to d/w pt about metformin and education on new DM dx ? ? ? ?LOS: ?5 days ?A FACE TO FACE EVALUATION WAS PERFORMED ? ?Devin Roberts ?06/04/2021, 8:37 AM  ? ? ? ?

## 2021-06-05 ENCOUNTER — Encounter: Payer: No Typology Code available for payment source | Admitting: Internal Medicine

## 2021-06-05 DIAGNOSIS — T07XXXA Unspecified multiple injuries, initial encounter: Secondary | ICD-10-CM | POA: Diagnosis not present

## 2021-06-05 LAB — GLUCOSE, CAPILLARY
Glucose-Capillary: 129 mg/dL — ABNORMAL HIGH (ref 70–99)
Glucose-Capillary: 132 mg/dL — ABNORMAL HIGH (ref 70–99)
Glucose-Capillary: 127 mg/dL — ABNORMAL HIGH (ref 70–99)
Glucose-Capillary: 119 mg/dL — ABNORMAL HIGH (ref 70–99)

## 2021-06-05 MED ORDER — LIVING WELL WITH DIABETES BOOK
Freq: Once | Status: AC
  Filled 2021-06-05: qty 1

## 2021-06-05 NOTE — Discharge Instructions (Addendum)
Inpatient Rehab Discharge Instructions ? ?Sibyl Parr ?Discharge date and time: 06/07/2021 ? ?Activities/Precautions/ Functional Status: ?Activity: no lifting, driving, or strenuous exercise for until cleared by MD ?Diet: diabetic diet ?Wound Care: keep wound clean and dry ?Functional status:  ?___ No restrictions     ___ Walk up steps independently ?___ 24/7 supervision/assistance   ___ Walk up steps with assistance ?___ Intermittent supervision/assistance  ___ Bathe/dress independently ?___ Walk with walker     ___ Bathe/dress with assistance ?___ Walk Independently    ___ Shower independently ?___ Walk with assistance    _x__ Shower with assistance ?__x_ No alcohol     ___ Return to work/school ________ ? ? ?COMMUNITY REFERRALS UPON DISCHARGE:   ? ? ?Outpatient: PT      OT      ?            Agency:Being managed by worker's comp.  ? ?Medical Equipment/Items Ordered:Right platform rolling walker, wheelchair with elevating leg rests, 3in1 bedside commode, shower seat with armrests and grabs bars for shower (suction or installed) ?                                                Agency/Supplier: Being managed by worker's comp ? ? ? ?Special Instructions: ? ?No weight bearing on left leg. ? ?No driving, alcohol consumption or tobacco use.  ? ?My questions have been answered and I understand these instructions. I will adhere to these goals and the provided educational materials after my discharge from the hospital. ? ?Patient/Caregiver Signature _______________________________ Date __________ ? ?Clinician Signature _______________________________________ Date __________ ? ?Please bring this form and your medication list with you to all your follow-up doctor's appointments.   ?

## 2021-06-05 NOTE — Progress Notes (Signed)
Occupational Therapy Discharge Summary ? ?Patient Details  ?Name: Devin Roberts ?MRN: [id number] ?Date of Birth: March 29, 1960 ? ?Patient has met 11 of 11 long term goals due to improved activity tolerance, improved balance, postural control, and ability to compensate for deficits.  Pt made excellent progress with BADLs and functional transfers during this admission. Pt completes all BADLs and transfers with supervision. Pt's wife provides the appropriate level of supervisoin. Pt is independent in directing care. Patient to discharge at overall Modified Independent- supervision level.  Patient's care partner is independent to provide the necessary physical assistance at discharge.   ? ?Reasons goals not met: N/A ? ?Recommendation:  ?Patient will benefit from ongoing skilled OT services in outpatient setting to continue to advance functional skills in the area of BADL, iADL, and Vocation. ? ?Equipment: ?BSC ? ?Reasons for discharge: treatment goals met and discharge from hospital ? ?Patient/family agrees with progress made and goals achieved: Yes ? ?OT Discharge ?  ?ADL ?ADL ?Equipment Provided: Long-handled sponge ?Eating: Independent ?Where Assessed-Eating: Wheelchair ?Grooming: Independent ?Where Assessed-Grooming: Sitting at sink, Wheelchair ?Upper Body Bathing: Modified independent ?Where Assessed-Upper Body Bathing: Shower ?Lower Body Bathing: Supervision/safety ?Where Assessed-Lower Body Bathing: Shower ?Upper Body Dressing: Independent ?Where Assessed-Upper Body Dressing: Wheelchair ?Lower Body Dressing: Supervision/safety ?Where Assessed-Lower Body Dressing: Wheelchair ?Toileting: Supervision/safety ?Where Assessed-Toileting: Toilet ?Toilet Transfer: Distant supervision ?Toilet Transfer Method: Ambulating ?Science writer: Grab bars ?Walk-In Shower Transfer: Close supervision ?Walk-In Shower Transfer Method: Ambulating ?Walk-In Shower Equipment: Radio broadcast assistant ?ADL Comments: Cues for activity  pacing, adherence to spinal precautions and adhernece to LLE NWB precautions. ?Vision ?Baseline Vision/History: 0 No visual deficits ?Patient Visual Report: No change from baseline ?Vision Assessment?: No apparent visual deficits ?Perception  ?Perception: Within Functional Limits ?Praxis ?Praxis: Intact ?Cognition ?Overall Cognitive Status: Within Functional Limits for tasks assessed ?Arousal/Alertness: Awake/alert ?Orientation Level: Oriented X4 ?Year: 2023 ?Month: March ?Day of Week: Correct ?Attention: Sustained ?Sustained Attention: Appears intact ?Memory: Appears intact ?Immediate Memory Recall: Sock;Blue;Bed ?Memory Recall Sock: Without Cue ?Memory Recall Blue: Without Cue ?Memory Recall Bed: Without Cue ?Awareness: Appears intact ?Problem Solving: Appears intact ?Safety/Judgment: Appears intact ?Sensation ?Sensation ?Light Touch: Appears Intact ?Hot/Cold: Appears Intact ?Proprioception: Appears Intact ?Stereognosis: Not tested ?Coordination ?Gross Motor Movements are Fluid and Coordinated: Yes ?Fine Motor Movements are Fluid and Coordinated: Yes ?Motor  ?Motor ?Motor: Within Functional Limits ?Motor - Skilled Clinical Observations: LSO, short leg splint ?Mobility  ?   ?Trunk/Postural Assessment  ?Cervical Assessment ?Cervical Assessment: Within Functional Limits ?Thoracic Assessment ?Thoracic Assessment:  (LSO due to lumbar fx) ?Lumbar Assessment ?Lumbar Assessment: Exceptions to WFL (LSO) ?Postural Control ?Postural Control: Within Functional Limits  ?Balance ?Static Sitting Balance ?Static Sitting - Balance Support: No upper extremity supported;Feet unsupported ?Static Sitting - Level of Assistance: 6: Modified independent (Device/Increase time) ?Dynamic Sitting Balance ?Dynamic Sitting - Balance Support: During functional activity ?Dynamic Sitting - Level of Assistance: 6: Modified independent (Device/Increase time) ?Extremity/Trunk Assessment ?RUE Assessment ?RUE Assessment: Exceptions to [facility] ?Passive  Range of Motion (PROM) Comments: Pt able to self ROM shoulder to 90* in sitting and ER full w/elbow at side.  Limited by pain, defer to OT for full assessemtn ?Active Range of Motion (AROM) Comments: Assessment limited by pain. AROM slighlty <90 degrees at shoulder. WFL at elbow, wrist and digits. ?General Strength Comments: 4/4 at elbow, wrist and digit. Did not assess at shoulder 2/2 scapular fx. ?LUE Assessment ?LUE Assessment: Within Functional Limits ?General Strength Comments: 5/5 grossly ? ? ?Leroy Libman ?06/05/2021, 2:57  PM ?

## 2021-06-05 NOTE — Progress Notes (Signed)
Physical Therapy Discharge Summary ? ?Patient Details  ?Name: Devin Roberts ?MRN: [id number] ?Date of Birth: [birth year] ? ?Today's Date: 06/06/2021 ? ?Patient has met 11 of 11 long term goals due to improved activity tolerance, improved balance, increased strength, decreased pain, improved awareness, and improved coordination.  Patient to discharge at  mod I for short distance ambulation, otherwise mod I for wheelchair .   Patient's care partner  is not necessary  to provide assistance due to pt being at mod I level at discharge. ? ?Reasons goals not met: N/A; all goals met ? ?Recommendation:  ?Patient will benefit from ongoing skilled PT services in outpatient setting to continue to advance safe functional mobility, address ongoing impairments in balance, strength, and activity tolerance on the affected LE once weight bearing precautions are lifted, and minimize fall risk. ? ?Equipment: ?R PFRW, manual W/C, elevating leg rest ? ?Reasons for discharge: treatment goals met ? ?Patient/family agrees with progress made and goals achieved: Yes ? ?PT Discharge ?Precautions/Restrictions ?Precautions ?Precautions: Back;Fall ?Precaution Booklet Issued: No ?Precaution Comments: pt has been instructed on use of his ?Required Braces or Orthoses: Spinal Brace ?Spinal Brace: Lumbar corset;Applied in sitting position ?Restrictions ?Weight Bearing Restrictions: Yes ?RUE Weight Bearing: Weight bearing as tolerated ?LLE Weight Bearing: Non weight bearing ?Other Position/Activity Restrictions: LE ROM to tolerance ?Pain ?Pain Assessment ?Pain Scale: 0-10 ?Pain Score: 1  ?Pain Type: Acute pain ?Pain Location: Head ?Pain Orientation: Anterior ?Pain Descriptors / Indicators: Headache ?Pain Frequency: Intermittent ?Pain Onset: Gradual ?Pain Intervention(s): Medication (See eMAR) ?Pain Interference ?Pain Interference ?Pain Effect on Sleep: 2. Occasionally ?Pain Interference with Therapy Activities: 3. Frequently ?Pain Interference with  Day-to-Day Activities: 3. Frequently ?Vision/Perception  ?   ?Cognition ?Overall Cognitive Status: Within Functional Limits for tasks assessed ?Arousal/Alertness: Awake/alert ?Orientation Level: Oriented X4 ?Attention: Sustained ?Sustained Attention: Appears intact ?Memory: Appears intact ?Awareness: Appears intact ?Problem Solving: Appears intact ?Safety/Judgment: Appears intact ?Sensation ?Sensation ?Light Touch: Appears Intact ?Hot/Cold: Appears Intact ?Proprioception: Appears Intact ?Stereognosis: Not tested ?Coordination ?Gross Motor Movements are Fluid and Coordinated: Yes (Impaired d/t WB restrictions on LLE) ?Fine Motor Movements are Fluid and Coordinated: Yes (Impaired d/t WB restrictions and pain in RUE) ?Motor  ?Motor ?Motor: Abnormal postural alignment and control ?Motor - Discharge Observations: Impaired d/t pain and WB restrictions  ?Mobility ?Bed Mobility ?Bed Mobility: Rolling Right;Rolling Left;Supine to Sit;Sit to Supine ?Rolling Right: Independent ?Rolling Left: Independent ?Supine to Sit: Independent ?Sit to Supine: Independent ?Transfers ?Transfers: Sit to Stand;Stand to Sit ?Sit to Stand: Independent with assistive device ?Stand to Sit: Independent with assistive device ?Stand Pivot Transfers: Independent with assistive device ?Stand Pivot Transfer Details: Other (comment) ?Stand Pivot Transfer Details (indicate cue type and reason): Maintains NWB without cuing ?Transfer (Assistive device): Right platform walker ?Locomotion  ?Gait ?Ambulation: Yes ?Gait Assistance: Independent with assistive device ?Gait Distance (Feet): 150 Feet ?Assistive device: Right platform walker ?Gait ?Gait: Yes ?Gait Pattern: Impaired ?Gait Pattern: Poor foot clearance - right ?Gait velocity: very decreased ?Stairs / Additional Locomotion ?Stairs: Yes ?Stairs Assistance: Independent with assistive device ?Stair Management Technique: Backwards;With walker ?Number of Stairs: 1 ?Height of Stairs: 8 ?Ramp: Independent with  assistive device ?Curb: Independent with assistive device ?Pick up small object from the floor (from standing position) activity did not occur: Safety/medical concerns ?Wheelchair Mobility ?Wheelchair Mobility: Yes ?Wheelchair Assistance: Independent with assistive device ?Wheelchair Propulsion: Both upper extremities ?Wheelchair Parts Management: Independent ?Distance: 150  ?  ? ? ? ?Lanetta Inch SPT ? ?06/06/2021, 5:29 PM ?

## 2021-06-05 NOTE — Progress Notes (Signed)
Orthopedic Tech Progress Note ?Patient Details:  ?ZYIR MACRAE ?September 18, 1960 ?[id number] ? ?Ortho Devices ?Type of Ortho Device: Lumbar corsett ?Ortho Device/Splint Location: BACK ?Ortho Device/Splint Interventions: Ordered ?  ?Post Interventions ?Patient Tolerated: Well ?Instructions Provided: Care of device ? ?Janit Pagan ?06/05/2021, 11:35 AM ? ?

## 2021-06-05 NOTE — Progress Notes (Signed)
Occupational Therapy Session Note ? ?Patient Details  ?Name: Devin Roberts ?MRN: [id number] ?Date of Birth: [birth year] ? ?Today's Date: 06/05/2021 ?OT Individual Time: [id number] ?OT Individual Time Calculation (min): 72 min  ? ? ?Short Term Goals: ?Week 1:  OT Short Term Goal 1 (Week 1): Patient will complete sit to stand transfers with CGA and LRAD. ?OT Short Term Goal 2 (Week 1): Patient will complete toilet transfers with CGA and LRAD. ?OT Short Term Goal 3 (Week 1): Patient will don LB clothing with CGA, AE and LRAD. ? ?Skilled Therapeutic Interventions/Progress Updates:  ?  Pt resting in bed upon arrival. OT intervention with focus on bathing at shower level, dressing with sit<>stand from w/c, functional transers, functional amb with PFRW, standing balance, discharge planning, and safety awareness to increase independene with BADLS. Pt amb with PFRW to bathroom with CGA and transferred to TTB. Pt doffed TLSO with supervision. Pt educated on wrapping LLE with plastic bag to protect from water during shower. Pt completed bathing tasks with CGA LOB X1 but self corrected. Pt donned underpants and sock/shoe and TLSO before amb with PFRW to room to completed dressing tasks with CGA when standing. Pt transitioned to Day Room and engaged in standing activities (corn hole and pipe tree tasks) with CGA when standing. Pt returned to room and amb with PFRW to recliner. Pt remained in recliner with all needs within reach. ? ?Therapy Documentation ?Precautions:  ?Precautions ?Precautions: Back, Fall ?Precaution Booklet Issued: No (Verbal re-education proved) ?Required Braces or Orthoses: Spinal Brace, Sling ?Spinal Brace: Thoracolumbosacral orthotic, Applied in supine position ?Restrictions ?Weight Bearing Restrictions: Yes ?RUE Weight Bearing: Weight bearing as tolerated ?LLE Weight Bearing: Non weight bearing ?Other Position/Activity Restrictions: LE ROM to tolerance ?Pain: ? Pt c/o increased Rt shoulder "soreness" unrated;  rest and repositioned ? ?Therapy/Group: Individual Therapy ? ?Leroy Libman ?06/05/2021, 8:14 AM ?

## 2021-06-05 NOTE — Progress Notes (Signed)
Occupational Therapy Session Note ? ?Patient Details  ?Name: Devin Roberts ?MRN: [id number] ?Date of Birth: Aug 04, 1960 ? ?Today's Date: 06/05/2021 ?OT Individual Time: 1300-1340 ?OT Individual Time Calculation (min): 40 min  ? ? ?Short Term Goals: ?Week 1:  OT Short Term Goal 1 (Week 1): Patient will complete sit to stand transfers with CGA and LRAD. ?OT Short Term Goal 2 (Week 1): Patient will complete toilet transfers with CGA and LRAD. ?OT Short Term Goal 3 (Week 1): Patient will don LB clothing with CGA, AE and LRAD. ? ?Skilled Therapeutic Interventions/Progress Updates:  ?  Pt resting in recliner upon arrival. Pt's LSO delivered. Educated pt on donning LSO. Pt donned LSO while seated in recliner. OT intervention with focus on standing balance. Pt amb with PFRW to gather towels from floor using reacher. Pt stood at table to fold towels. Amb in gym with PFRW with supervision. Pt returned to room and remained in recliner. All needs within reach.  ? ?Therapy Documentation ?Precautions:  ?Precautions ?Precautions: Back, Fall ?Precaution Booklet Issued: No (Verbal re-education proved) ?Required Braces or Orthoses: Spinal Brace, Sling ?Spinal Brace: Thoracolumbosacral orthotic, Applied in supine position ?Restrictions ?Weight Bearing Restrictions: Yes ?RUE Weight Bearing: Weight bearing as tolerated ?LLE Weight Bearing: Non weight bearing ?Other Position/Activity Restrictions: LE ROM to tolerance ?  ?Pain: ? Pt c/o Rt shoulder pain (unrated); repositioned and ice applied ? ? ?Therapy/Group: Individual Therapy ? ?[name] ?06/05/2021, 1:50 PM ?

## 2021-06-05 NOTE — Consult Note (Signed)
Neuropsychological Consultation ? ? ?Patient:   VORIS PIOTROWICZ  ? ?DOB:   March 24, 1961 ? ?MR Number:  [id number] ? ?Location:  [facility] [address] ?[id number] [facility] ?[place] [address] ?Dept: [phone] ?[id number]:[id number] ?          ?Date of Service:   06/04/2021 ? ?Start Time:   9 AM ?End Time:   10 AM ? ?Provider/Observer:  Ilean Skill, Psy.D.   ?    Clinical Neuropsychologist ?     ? ?Billing Code/Service: [id number] ? ?Chief Complaint:    CRUZITO VAUGHNS is a 61 year old male who sustained significant polytrauma while working.  The patient is described as moving pallets on 05/25/2021 when a gantry crane traveled off his track and struck him from above on his right shoulder forcing him down between 2 wooden pallets.  Patient was brought to [facility] emergency department by [facility].  Patient was alert and oriented and hemodynamically stable.  Patient suffered a left tibia/fibular shaft fracture and underwent ORIF repair by Dr. Ginette Pitman.  Patient also sustained a right scapular body fracture.  CT scan also revealed vertical fracture through the L1 vertebral body and a mild compression fracture of L2.  Conservative care with TLSO brace applied.  Patient also suffered an acute kidney injury.  The patient required inpatient hospitalization and rehabilitation services for ongoing dysfunction secondary to polytrauma. ? ?Reason for Service:  The patient was referred for neuropsychological consultation due to coping and adjustment with multiple orthopedic injuries and extended hospital stay.  Below is the HPI for the current admission. ? ?HPI: Ricky L. Vanos is a 61 year old male who sustained an injury while working.  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  He was brought to [facility] emergency department via [facility].  He was alert and oriented x4 and hemodynamically stable.  Imaging revealed left tibia/fibular shaft fracture and underwent ORIF repair by Dr. Marcelino Scot.  He also sustained a right scapular body fracture that did not require operative treatment.  He was initially nonweightbearing and sling.  CT scan revealed a vertical fracture through the L1 vertebral body and a mild compression fracture of L2.  Neurosurgery (Dr. Sherley Bounds) was consulted and he was fitted for TLSO brace.  Laboratory work-up revealed elevated serum creatinine consistent with acute kidney injury.  This normalized quickly and has been normal in the last 3 days.  Elevated white blood cell count to 14,000 without fever or signs of infection. ?  ?Orthopedic recommendations as follows: Continue Lovenox for approximately 21 days.  This would take him through 3/27.  He can use sling for comfort for right upper extremity and can use right upper extremity to help mobilize.  May weight-bear through right upper extremity.  He is nonweightbearing of left lower extremity for 8 weeks.  He will continue his splint for 2 weeks then can convert cam boot at that time.  Recommend suture removal around 3/20.  Unrestricted left knee and hip range of motion. TLSO when out of bed. The patient requires inpatient medicine and rehabilitation evaluations and services for ongoing dysfunction secondary to polytrauma. ? ?Current Status:  Patient was awake and oriented with good mental status throughout the 1 hour visit.  Patient described ongoing difficulties with his injuries and limitations  in physical functioning due to orthopedic injuries.  Patient acknowledged difficulty coping with current lack of functioning and extended hospital stay.  Patient reports he was always somebody you had to be doing something all the time and is difficult to cope with quiet times around the unit which tend to exacerbate and produce anxiety type symptoms. ? ?Behavioral Observation: GORGE KEHR  presents as a 61 y.o.-year-old Right handed Caucasian Male who appeared his stated age. his dress was Appropriate and he was Well Groomed and his manners were Appropriate to the situation.  his participation was indicative of Appropriate and Attentive behaviors.  There were physical disabilities noted.  he displayed an appropriate level of cooperation and motivation.   ? ? ?Interactions:    Active Appropriate ? ?Attention:   within normal limits and attention span and concentration were age appropriate ? ?Memory:   within normal limits; recent and remote memory intact ? ?Visuo-spatial:  not examined ? ?Speech (Volume):  normal ? ?Speech:   normal; normal ? ?Thought Process:  Coherent and Relevant ? ?Though Content:  WNL; not suicidal and not homicidal ? ?Orientation:   person, place, time/date, and situation ? ?Judgment:   Good ? ?Planning:   Good ? ?Affect:    Anxious ? ?Mood:    Anxious ? ?Insight:   Good ? ?Intelligence:   normal ? ?Medical History:   ?Past Medical History:  ?Diagnosis Date  ? Acute asthmatic bronchitis   ? Allergic rhinitis   ? Anxiety   ? Chronic insomnia   ? Diabetes mellitus   ? DJD (degenerative joint disease)   ? GERD (gastroesophageal reflux disease)   ? Headache(784.0)   ? Hypercholesterolemia   ? Hypertension   ? Obesity   ? ? ?     ?Patient Active Problem List  ? Diagnosis Date Noted  ? Critical polytrauma 05/30/2021  ? Multiple fractures 05/25/2021  ? Lesion of skin of right ear 01/18/2020  ? Chronic idiopathic constipation 12/07/2018  ? Pure hypertriglyceridemia 11/17/2018  ? Depression with anxiety 01/15/2018  ? Vitamin D deficiency 11/13/2017  ? Low back pain at multiple sites 04/03/2017  ? Snoring 01/06/2014  ? Type II diabetes mellitus with complication, uncontrolled 03/10/2012  ? Insomnia 03/10/2012  ? Annual physical exam 12/07/2010  ? Hyperlipidemia with target LDL less than 100 01/31/2007  ? Obesity 01/31/2007  ? Essential hypertension 01/31/2007  ? Allergic rhinitis  01/31/2007  ? GERD 01/31/2007  ? ?     ? ?     ?Abuse/Trauma History: Patient was involved in a significant workplace accident on 05/25/2021 in which she is working in an area in the [employer] which was being moved overhead ran off its track and fell down upon the patient striking his shoulder and causing multiple orthopedic injuries.  It sounds like the stop for the mechanized gantry crane failed or was taken off in someway allowing it to run off the tracks.  The patient reports that he did not see this happening or coming.  The patient denies any nightmares but does report an exacerbation of his anxiety which she has dealt with in the past with extended hospital stay. ? ?Psychiatric History:  The patient has been diagnosed in dealing with anxiety and depressive symptoms for some time but feels that it was well managed prior to this injury/accident. ? ?Family Med/Psych History:  ?Family History  ?Problem Relation Age of Onset  ? COPD Father   ? Lung cancer Father   ?  Hyperlipidemia Mother   ? Cancer Neg Hx   ? Diabetes Neg Hx   ? Heart disease Neg Hx   ? Hypertension Neg Hx   ? Kidney disease Neg Hx   ? Stroke Neg Hx   ? ? ?Risk of Suicide/Violence: virtually non-existent patient denies any suicidal or homicidal ideation. ? ?Impression/DX:  HAVEN PEDREIRA is a 61 year old male who sustained significant polytrauma while working.  The patient is described as moving pallets on 05/25/2021 when a gantry crane traveled off his track and struck him from above on his right shoulder forcing him down between 2 wooden pallets.  Patient was brought to [facility] emergency department by [facility].  Patient was alert and oriented and hemodynamically stable.  Patient suffered a left tibia/fibular shaft fracture and underwent ORIF repair by Dr. Ginette Pitman.  Patient also sustained a right scapular body fracture.  CT scan also revealed vertical fracture through the L1 vertebral body and a mild compression fracture of L2.   Conservative care with TLSO brace applied.  Patient also suffered an acute kidney injury.  The patient required inpatient hospitalization and rehabilitation services for ongoing dysfunction secondary to pol

## 2021-06-05 NOTE — Patient Care Conference (Signed)
Inpatient RehabilitationTeam Conference and Plan of Care Update ?Date: 06/05/2021   Time: 11:02 AM  ? ? ?Patient Name: Devin Roberts      ?Medical Record Number: [id number]  ?Date of Birth: 1960/12/26 ?Sex: Male         ?Room/Bed: 4W11C/4W11C-01 ?Payor Info: Payor: [employer] WORK[employer] / Plan: [employer] / Product Type: *No Product type* /   ? ?Admit Date/Time:  05/30/2021  2:40 PM ? ?Primary Diagnosis:  Critical polytrauma ? ?Hospital Problems: Principal Problem: ?  Critical polytrauma ? ? ? ?Expected Discharge Date: Expected Discharge Date: 06/07/21 ? ?Team Members Present: ?Physician leading conference: Dr. Genice Rouge ?Social Worker Present: Cecile Sheerer, LCSWA ?Nurse Present: Kennyth Arnold, RN ?PT Present: Peter Congo, PT ?OT Present: Ardis Rowan, Jaynee Eagles, OT ?PPS Coordinator present : Fae Pippin, SLP ? ?   Current Status/Progress Goal Weekly Team Focus  ?Bowel/Bladder ? ? Continent X2 LBM 3/13  remain continent  toilet as needed   ?Swallow/Nutrition/ Hydration ? ?           ?ADL's ? ? CGA shower transfers and LB bathing; CGA/supervision tranfsers  supervision overall  discharge planning, educaiton, standing balance, safety awareness   ?Mobility ? ? Supervision bed mobility, Supervision transfers with PFRW, Supervision gait up to 65 ft with PFRW, Supervision w/c mobility, CGA to min A step navigation with PFRW  mod I overall, short distance gait otherwise w/c level  d/c planning, functional transfers and gait, balance, strengthening and ROM   ?Communication ? ?           ?Safety/Cognition/ Behavioral Observations ?           ?Pain ? ? Patient rates pain on a scale of 0-10 (5) right shoulder  pain less than 3  assess pain q 4hr and prn   ?Skin ? ? Tib - Fib Fx left (cast)  no skin breakdown  assess skin q shift and prn   ? ? ?Discharge Planning:  ?Pt is worker's comp. D/c to home with support from his wife for 1 week, and family working on other supports that can be available.  DME/Outpatient orders sent on 3/13. Worker's comp to manage all care needs. Will schedule family edu.   ?Team Discussion: ?Newly diagnosed diabetic, started Metformin. Needs extensive diabetic teaching. Will not discharge on Klonopin. Has TLSO currently. Surgeon note calls for LSO brace, order placed. Pain controlled. Will replace cast with Cam boot post discharge. Continent B/B, reports pain is controlled. Receiving everything through Workers Comp. Equipment needs to be delivered to the home. Can get in the home without ramp but not practical.  ? ?Patient on target to meet rehab goals: ?yes, supervision to mod I overall. Showered today, needs to slow down, impulsive. ? ?*See Care Plan and progress notes for long and short-term goals.  ? ?Revisions to Treatment Plan:  ?Finalizing discharge medications and plans. ?  ?Teaching Needs: ?Family education, medication/pain management, skin/wound care, transfer/gait training, etc. ?  ?Current Barriers to Discharge: ?Inaccessible home environment, Decreased caregiver support, Home enviroment access/layout, Wound care, Lack of/limited family support, Weight bearing restrictions, and Workers Comp, ramp. ? ?Possible Resolutions to Barriers: ?Family education ?Family to put ramp in place ?Equipment from Workers Comp  ?Follow-up scheduled by Workers Comp ?  ? ? Medical Summary ?Current Status: more shoulder pain than LLE pain or back pain- conitnent- pain usually controlled on meds- cast LLE- NWB LLE ? Barriers to Discharge: Behavior;Home enviroment access/layout;Wound care;Weight bearing restrictions;Weight;Other (comments);Medical stability;New  diabetic ? Barriers to Discharge Comments: newly dx'd DM- needs education; WB restrictions are main limitation- ?Possible Resolutions to [employer]: goals Super-mod I- barriers is equipment- on track for goals- needs ramp for safety- not practical for therapies/doctor appt-- d/c 3/16 ? ? ?Continued Need for Acute  Rehabilitation Level of Care: The patient requires daily medical management by a physician with specialized training in physical medicine and rehabilitation for the following reasons: ?Direction of a multidisciplinary physical rehabilitation program to maximize functional independence : Yes ?Medical management of patient stability for increased activity during participation in an intensive rehabilitation regime.: Yes ?Analysis of laboratory values and/or radiology reports with any subsequent need for medication adjustment and/or medical intervention. : Yes ? ? ?I attest that I was present, lead the team conference, and concur with the assessment and plan of the team. ? ? ?Kennyth Arnold G ?06/05/2021, 3:23 PM  ? ? ? ? ? ? ?

## 2021-06-05 NOTE — Progress Notes (Signed)
Patient ID: Devin Roberts, male   DOB: 1960-07-19, 61 y.o.   MRN: [id number] ? ?SW returned phone call to pt wife to provide updates from team conference and d/c date 3/16. SW informed pt will not leave unless he has DME. SW shared ramp is not needed at discharge, however is needed for safety due to day-to-day activities. Wife confirms Rx information received already. SW informed ramp was ordered and it will be a temporary ramp that is delivered to the home, and waiting on when this item will be delivered. SW waiting also to hear on if DME will be delivered to the home or hospital.  ? ?SW spoke with Steve/[facility] to discuss patient discharge. Reports [facility] did not receive fax. SW faxed orders again to [facility] [phone]). States ramp has been ordered for patient. Will continue to remain in contact to discuss patient case.  ? ?SW confirmed with JillMarie/[facility] that fax number is accurate. When looking in notes, indicated that there was no insurance information indicated. SW reminded them to look at cover sheet which has WC CM contact information.  States staff member working on case is not in the office yet but will pass information along. ? ?SW met with pt in room to discuss above. Pt reports his anxiety is really high and he needs to be out by Thursday. SW informed best efforts will be made, however, unable to control this situation. SW informed will provide updates once there is more information.  ? ?[phone]- Kiara/[facility] to discuss status of referral. Reports referral has been received. SW checking status of DME delivery. SW asked if DME can physically be brought to the room, and if not shipped to home. She will share with coordinator and ask them to call be back. She will also place a rush on the order.  ? ?Loralee Pacas, MSW, LCSWA ?Office: [phone] ?Cell: [phone] ?Fax: [phone]  ?

## 2021-06-05 NOTE — Progress Notes (Signed)
?                                                       PROGRESS NOTE ? ? ?Subjective/Complaints: ? ?Pt wondering if needs TLSO- and not LSO? ?Is 8 days since surgery on LLE- ?Asking about ramp- which sounds like he needs, even though he's practiced in case there's a fire, he's unsafe to go in/out with current stair set up it appears.  ? ?Asking when he can be discharged.  ? ?ROS: ? ?Pt denies SOB, abd pain, CP, N/V/C/D, and vision changes ? ? ? ?Objective: ?  ?No results found. ?Recent Labs  ?  06/04/21 ?0522  ?WBC 10.8*  ?HGB 12.6*  ?HCT 36.4*  ?PLT 294  ? ? ?Recent Labs  ?  06/04/21 ?0522  ?NA 135  ?K 3.9  ?CL 97*  ?CO2 31  ?GLUCOSE 127*  ?BUN 21*  ?CREATININE 1.04  ?CALCIUM 9.2  ? ? ? ?Intake/Output Summary (Last 24 hours) at 06/05/2021 0901 ?Last data filed at 06/04/2021 1700 ?Gross per 24 hour  ?Intake 118 ml  ?Output --  ?Net 118 ml  ?  ? ?  ? ?Physical Exam: ?Vital Signs ?Blood pressure 120/71, pulse 72, temperature 98.2 ?F (36.8 ?C), temperature source Oral, resp. rate 18, height 5\' 8"  (1.727 m), SpO2 97 %. ? ? ? ? ? ? ? ?General: awake, alert, appropriate, in gym in w/c with OTA; NAD ?HENT: conjugate gaze; oropharynx moist ?CV: regular rate; no JVD ?Pulmonary: CTA B/L; no W/R/R- good air movement ?GI: soft, NT, ND, (+)BS ?Psychiatric: appropriate- interactive- frustrated over insurance issues ?Neurological: Ox3 ? ?Musculoskeletal: wearing TLSO- can wiggle toes on LLE with warm toes- looks stable ?   Comments: LLE splint and surgical dressing in place . Strength and sensation intact. RUE strength and sensation intact ?Skin: ?   General: Skin is warm and dry.  ? ?Assessment/Plan: ?1. Functional deficits which require 3+ hours per day of interdisciplinary therapy in a comprehensive inpatient rehab setting. ?Physiatrist is providing close team supervision and 24 hour management of active medical problems listed below. ?Physiatrist and rehab team continue to assess barriers to discharge/monitor patient progress  toward functional and medical goals ? ?Care Tool: ? ?Bathing ?   ?Body parts bathed by patient: Right arm, Left arm, Chest, Abdomen, Front perineal area, Right upper leg, Left upper leg, Face, Buttocks, Right lower leg  ? Body parts bathed by helper: Right lower leg, Buttocks ?Body parts n/a: Left lower leg ?  ?Bathing assist Assist Level: Contact Guard/Touching assist ?  ?  ?Upper Body Dressing/Undressing ?Upper body dressing   ?What is the patient wearing?: Pull over shirt ?   ?Upper body assist Assist Level: Set up assist ?   ?Lower Body Dressing/Undressing ?Lower body dressing ? ? ?   ?What is the patient wearing?: Pants, Underwear/pull up ? ?  ? ?Lower body assist Assist for lower body dressing: Contact Guard/Touching assist ?   ? ?Toileting ?Toileting Toileting Activity did not occur ( and hygiene only): N/A (no void or bm)  ?Toileting assist Assist for toileting: Moderate Assistance - Patient 50 - 74% ?  ?  ?Transfers ?Chair/bed transfer ? ?Transfers assist ?   ? ?Chair/bed transfer assist level: Contact Guard/Touching assist ?  ?  ?Locomotion ?Ambulation ? ? ?Ambulation assist ? ?   ? ?  Assist level: Contact Guard/Touching assist ?Assistive device: Walker-platform ?Max distance: 3465'  ? ?Walk 10 feet activity ? ? ?Assist ?   ? ?Assist level: Contact Guard/Touching assist ?Assistive device: Walker-platform  ? ?Walk 50 feet activity ? ? ?Assist   ? ?Assist level: Contact Guard/Touching assist ?Assistive device: Walker-platform  ? ? ?Walk 150 feet activity ? ? ?Assist   ? ?Assist level: Total Assistance - Patient < 25% ?Assistive device: Walker-platform ?  ? ?Walk 10 feet on uneven surface  ?activity ? ? ?Assist Walk 10 feet on uneven surfaces activity did not occur: Safety/medical concerns ? ? ?  ?   ? ?Wheelchair ? ? ? ? ?Assist Is the patient using a wheelchair?: Yes ?Type of Wheelchair: Manual ?  ? ?Wheelchair assist level: Supervision/Verbal cueing ?Max wheelchair distance: 150'   ? ? ?Wheelchair 50 feet with 2 turns activity ? ? ? ?Assist ? ?  ?  ? ? ?Assist Level: Supervision/Verbal cueing  ? ?Wheelchair 150 feet activity  ? ? ? ?Assist ?   ? ? ?Assist Level: Supervision/Verbal cueing  ? ?Blood pressure 120/71, pulse 72, temperature 98.2 ?F (36.8 ?C), temperature source Oral, resp. rate 18, height 5\' 8"  (1.727 m), SpO2 97 %. ? ?Medical Problem List and Plan: ?1. Functional deficits secondary to polytrauma ?            -patient may shower ?            -ELOS/Goals: S 10-14 days ?            -First day of evaluations- PT and OT- NWB LLE- WBAT RUE- sling for comfort; no ROM restrictions ? Con't CIR- PT and OT- team conference today to determine length of stay ?2.  Antithrombotics: ?-DVT/anticoagulation:  Pharmaceutical: Lovenox ?            -antiplatelet therapy: None (was on aspirin 81 mg daily prior to admission) ?3. Pain Management: Tylenol as needed, Robaxin 3 times daily, oxycodone as needed ?            -ice and elevate LLE ? 3/9- d/w pt- suggest using 5-10 mg in Am before therapy and at lunch- and then at bedtime- can take more, but at least 3x/day for the next few days.  ? 3/14- pai controlled- con't regimen-  ?4. Mood: LCSW to evaluate and provide emotional support ?            -antipsychotic agents: n/a ?5. Neuropsych: This patient is capable of making decisions on his own behalf. ?6. Skin/Wound Care: Routine skin care checks ?            -- Monitor LLE incisions: Sutures out approximately 3/20 ? 3/14- will likely be able to switch to boot then, but he will likely be discharged by then- will need ortho f/u asap ?7. Fluids/Electrolytes/Nutrition: Routine I's and O's and follow-up chemistries ?            --continue carb modified diet ?8:  ORIF left pilon fracture and syndesmosis, left proximal tibia fracture ?            --NWB times 8 weeks ?            --splint times 2 weeks, then CAM boot (3/20) ?            --unrestricted left knee and hip ROM ?9: right scapular fracture:  non-operative repair ?            --sling for comfort ?            --  early ROM ?            --can use RUE to help mobilize ?            --may weight bear through RUE ?10: L1, L2 vertebral body compression fractures ?            --continue TSLO when out of bed ?11: Hyperlipidemia: Pravachol (and Vascepa) on home med list but statin listed as allergy/intolerant>>wife says not allergic and has been tolerating so will restart ?12: Hypertension: continue chlorthalidone 25 mg daily ?            --on Edarbyclor 40/25 at home ? 3/10- DBP a little elevated, but SBP controlled- con't regimen ?13: Hyperglycemia with A1c = 6.5 ?            --Continue CBGs QID and SSI ? 3/12- Cbgs 112-155- con't regimen ? 3/13- on no DM meds- will try metformin 500 mg BID with meals- to see if can get better BG control ? 3/14- Cbgs look better- 102-139- not up to 150s-160s today- con't regimen ?14: Constipation: continue scheduled Colace and PRNs ordered ?            -3/9- LBM 6 days ago- will give Sorbitol 45cc at lunch per pt request-  ? 3/10- large BM last night- on toilet- con't regimen ? 3/12- LBM 2 days ago- took sorbitol already today.  ? 3/13- LBM yesterday with sorbitol ?15: Insomnia, chronic: takes Ambien 10 mg every night>>will resume ?16. Obesity BMI 33.5: provide dietary education ? 17. Leukocytosis ? 3/10- WBC 13.9- hasn't come down- will recheck Monday- also U/A negative and no Sx's of illness- no fever.  ? 3/13- WBC down to 10.8- will con't to monitor ?18. Anxiety ? 3/12- was given a dose of Klonopin BID prn- don't want pt to have issues esp since also on pain meds- will start Paxil to get a better control of anxiety/prevent Sx's.  ? ? ?I spent a total of 39   minutes on total care today- >50% coordination of care- due to prolonged d/w pt about ramp/equipment and SW as well as team conference.  ? ? ? ?LOS: ?6 days ?A FACE TO FACE EVALUATION WAS PERFORMED ? ?Steaven Wholey ?06/05/2021, 9:01 AM  ? ? ? ?

## 2021-06-05 NOTE — Progress Notes (Signed)
Physical Therapy Session Note ? ?Patient Details  ?Name: Devin Roberts ?MRN: [id number] ?Date of Birth: 08/16/1960 ? ?Today's Date: 06/05/2021 ?PT Individual Time: 5956-3875 ?PT Individual Time Calculation (min): 70 min  ? ?Short Term Goals: ?Week 1:  PT Short Term Goal 1 (Week 1): squat pivot w/set up assist only ?PT Short Term Goal 2 (Week 1): wc propulsion x 74ft w/cues, additional time. ?PT Short Term Goal 3 (Week 1): Gait 24ft w/PFRW NWB RLE w/cga ?PT Short Term Goal 4 (Week 1): mod I bed mobility ? ?Skilled Therapeutic Interventions/Progress Updates:  ?Pt was received sitting in armed chair with BLE's elevated. Pt was agreeable to therapy. Pt reports mainly pain/soreness in his R shoulder, not rated. Because of UE pain, he requests to be transported in W/C with the exception of ambulating short distances during session. Overall, pt is performing close to goal-level with most activities. Pt is supervision for bed mobility and transfers with PFRW. Pt performed SLS on Airex with PFRW in reach and CGA to challenge balance; 3 x 30 sec. He also ambulated for 5-72ft up/down an incline and performed W/C mobility to simulate ramp to be built at home, all CGA since this was a new activity. Car transfer performed with supervision. Pt received education about the benefit of balance activities even though he reports he will always utilize his PFRW for increased stability until he is safe to WB on the LLE. Pt performed strengthening exercises on the LLE for 2 sets of 10 per exercise, including: ?- SLR  ?- modified glute bridge on bolster ?- hip abd in supine ?- heel slides in supine ? Pt agreed that an HEP print out would be beneficial. Pt was transported back to his room in W/C due to RUE pain and fatigue. Pt was left in armed chair with BLE's elevated and all needs in reach. ? ?Therapy Documentation ?Precautions:  ?Precautions ?Precautions: Back, Fall ?Precaution Booklet Issued: No (Verbal re-education proved) ?Required  Braces or Orthoses: Spinal Brace, Sling ?Spinal Brace: Thoracolumbosacral orthotic, Applied in supine position ?Restrictions ?Weight Bearing Restrictions: Yes ?RUE Weight Bearing: Weight bearing as tolerated ?LLE Weight Bearing: Non weight bearing ?Other Position/Activity Restrictions: LE ROM to tolerance ? ? ? ? ? ? ?Therapy/Group: Individual Therapy ? ?Charmian Muff SPT  ?06/05/2021, 12:20 PM  ?

## 2021-06-06 DIAGNOSIS — T07XXXA Unspecified multiple injuries, initial encounter: Secondary | ICD-10-CM | POA: Diagnosis not present

## 2021-06-06 LAB — CBC
HCT: 35.3 % — ABNORMAL LOW (ref 39.0–52.0)
Hemoglobin: 11.9 g/dL — ABNORMAL LOW (ref 13.0–17.0)
MCH: 32.3 pg (ref 26.0–34.0)
MCHC: 33.7 g/dL (ref 30.0–36.0)
MCV: 95.9 fL (ref 80.0–100.0)
Platelets: 287 10*3/uL (ref 150–400)
RBC: 3.68 MIL/uL — ABNORMAL LOW (ref 4.22–5.81)
RDW: 12.7 % (ref 11.5–15.5)
WBC: 9.7 10*3/uL (ref 4.0–10.5)
nRBC: 0 % (ref 0.0–0.2)

## 2021-06-06 LAB — BASIC METABOLIC PANEL
Anion gap: 10 (ref 5–15)
BUN: 22 mg/dL — ABNORMAL HIGH (ref 6–20)
CO2: 29 mmol/L (ref 22–32)
Calcium: 8.7 mg/dL — ABNORMAL LOW (ref 8.9–10.3)
Chloride: 95 mmol/L — ABNORMAL LOW (ref 98–111)
Creatinine, Ser: 0.92 mg/dL (ref 0.61–1.24)
GFR, Estimated: >60 mL/min (ref >60)
Glucose, Bld: 140 mg/dL — ABNORMAL HIGH (ref 70–99)
Potassium: 3.8 mmol/L (ref 3.5–5.1)
Sodium: 134 mmol/L — ABNORMAL LOW (ref 135–145)

## 2021-06-06 LAB — GLUCOSE, CAPILLARY
Glucose-Capillary: 144 mg/dL — ABNORMAL HIGH (ref 70–99)
Glucose-Capillary: 113 mg/dL — ABNORMAL HIGH (ref 70–99)
Glucose-Capillary: 103 mg/dL — ABNORMAL HIGH (ref 70–99)
Glucose-Capillary: 123 mg/dL — ABNORMAL HIGH (ref 70–99)

## 2021-06-06 NOTE — Progress Notes (Addendum)
Inpatient Rehabilitation Discharge Medication Review by a Pharmacist ? ?A complete drug regimen review was completed for this patient to identify any potential clinically significant medication issues. ? ?High Risk Drug Classes Is patient taking? Indication by Medication  ?Antipsychotic No   ?Anticoagulant Yes Lovenox - VTE ppx  ?Antibiotic No   ?Opioid Yes OxyIR - acute pain  ?Antiplatelet Yes bASA- CVA prophylaxis  ?Hypoglycemics/insulin Yes Metformin - T2DM  ?Vasoactive Medication Yes Edarbyclor, irbesartan - HTN  ?Chemotherapy No   ?Other Yes Pravastatin - HLD ?Robaxin - muscle spasms ?Zolpidem - sleep  ? ? ? ?Type of Medication Issue Identified Description of Issue Recommendation(s)  ?Drug Interaction(s) (clinically significant) ?    ?Duplicate Therapy ?    ?Allergy ?    ?No Medication Administration End Date ?    ?Incorrect Dose ?    ?Additional Drug Therapy Needed ?    ?Significant med changes from prior encounter (inform family/care partners about these prior to discharge).    ?Other ?    ? ? ?Clinically significant medication issues were identified that warrant physician communication and completion of prescribed/recommended actions by midnight of the next day:  No ? ?Time spent performing this drug regimen review (minutes):  30 ? ?Dani Anastasovites BS ?Pharmacy Student ?06/06/2021 1:37 PM ? ? ? ?  ?

## 2021-06-06 NOTE — Progress Notes (Addendum)
Patient ID: Devin Roberts, male   DOB: 14-Jun-1960, 61 y.o.   MRN: [id number] ? ?SW spoke with representative/[employer] [phone]) to inquire about status of DME. SW was informed rep working on case-[place], has notes indicating a referral was sent out to various vendors about DME for tomorrow. Will put in note requesting return phone call to SW.  ? ?SW emailed Worker's Comp team to inquire if pt has all DME needed to go home, are they in agreement with d/c until the items ordered arrive. SW left message for Steve/[facility] [phone]) to inform on clinicals faxed and above email sent. SW waiting on follow-up.  ? ?*Worker's comp agreeable for pt to leave with borrowed DME: R platform RW and wheelchair. SW left message for pt wife Devin Roberts [phone])  to inform on above. SW met with pt to discuss above a well.  ? ?Loralee Pacas, MSW, LCSWA ?Office: [phone] ?Cell: [phone] ?Fax: [phone]  ?

## 2021-06-06 NOTE — Progress Notes (Signed)
Physical Therapy Session Note ? ?Patient Details  ?Name: Devin Roberts ?MRN: [id number] ?Date of Birth: 1960-03-26 ? ?Today's Date: 06/06/2021 ?PT Individual Time: 9518-8416 ?PT Individual Time Calculation (min): 40 min  ? ?Short Term Goals: ?Week 1:  PT Short Term Goal 1 (Week 1): squat pivot w/set up assist only ?PT Short Term Goal 2 (Week 1): wc propulsion x 73f w/cues, additional time. ?PT Short Term Goal 3 (Week 1): Gait 481fw/PFRW NWB RLE w/cga ?PT Short Term Goal 4 (Week 1): mod I bed mobility ? ?Skilled Therapeutic Interventions/Progress Updates: Pt presented in recliner anxious but agreeable to therapy. Pt states pain controlled at moment but perseverating on obtaining ELR in hopes that pt can d/c tomorrow. PTA performed active listening and communicated with care team and supervisor in completing obtaining temporary DME. PTA discussed with Auria, LSW that we were able to complete DME (less ramp) that would permit pt to d/c tomorrow. Pt appreciative of support. PTA advised that pt is currently NOT cleared for d/c until we receive green light from worker's comp. Pt verbalized understanding and advised will have wife contact worker's comp as well. Pt then agreeable to ambulation and w/c propulsion. Pt performed Sit to stand from recliner mod I and ambulated ~12033fith PFRW maintaining all precautions and demonstrating fair safety. Pt did required x 1 cue for pacing as PTA noted that pt was increasing speed but also decreasing foot clearance. Pt then propelled same distance back to room mod I and demonstrated mod I for brakes and w/c management parts. Once back in room pt performed ambulatory transfer mod I with PFRW back to recliner. Pt left in recliner at end of session with call bell within reach and needs met.  ?   ? ?Therapy Documentation ?Precautions:  ?Precautions ?Precautions: Back, Fall ?Precaution Booklet Issued: No (Verbal re-education proved) ?Required Braces or Orthoses: Spinal Brace  (LSO) ?Spinal Brace: Lumbar corset, Applied in sitting position ?Restrictions ?Weight Bearing Restrictions: Yes ?RUE Weight Bearing: Weight bearing as tolerated ?LLE Weight Bearing: Non weight bearing ?Other Position/Activity Restrictions: LE ROM to tolerance ?General: ?PT Amount of Missed Time (min): 15 Minutes ?PT Missed Treatment Reason: Other (Comment) (Pt fatigue and all therapy goals met) ?Vital Signs: ?Therapy Vitals ?Temp: 98.5 ?F (36.9 ?C) ?Pulse Rate: 70 ?Resp: 17 ?BP: 136/81 ?Patient Position (if appropriate): Sitting ?Oxygen Therapy ?SpO2: 96 % ?O2 Device: Room Air ?Pain: ?Pain Assessment ?Pain Scale: 0-10 ?Pain Score: 1  ?Pain Type: Acute pain ?Pain Location: Head ?Pain Orientation: Anterior ?Pain Descriptors / Indicators: Headache ?Pain Frequency: Intermittent ?Pain Onset: Gradual ?Pain Intervention(s): Medication (See eMAR) ?Mobility: ?Bed Mobility ?Bed Mobility: Rolling Right;Rolling Left;Supine to Sit;Sit to Supine ?Rolling Right: Independent ?Rolling Left: Independent ?Supine to Sit: Independent ?Sit to Supine: Independent ?Transfers ?Transfers: Sit to Stand;Stand to Sit ?Sit to Stand: Independent with assistive device ?Stand to Sit: Independent with assistive device ?Stand Pivot Transfers: Independent with assistive device ?Stand Pivot Transfer Details: Other (comment) (maintains NWB without cuing and demonstrates safety awareness when backing up to w/c before sitting and uses 1UE on R PFRW, 1UE on w/c) ?Stand Pivot Transfer Details (indicate cue type and reason): maintains NWB without cuing ?Transfer (Assistive device): Right platform walker ?Locomotion : ?Gait ?Ambulation: Yes ?Gait Assistance: Independent with assistive device ?Gait Distance (Feet): 120 Feet ?Assistive device: Right platform walker ?Gait Assistance Details: Cues required for speed as when pt increased pace decreased foot clearance noted. ?Gait ?Gait: Yes ?Gait Pattern: Impaired ?Wheelchair Mobility ?Wheelchair Mobility:  Yes ?Wheelchair Assistance:  Independent with assistive device ?Wheelchair Propulsion: Both upper extremities ?Wheelchair Parts Management: Independent  ?Trunk/Postural Assessment : ?   ?Balance: ?  ?Exercises: ?  ?Other Treatments:   ? ? ? ?Therapy/Group: Individual Therapy ? ?Derek Laughter ?06/06/2021, 4:19 PM  ?

## 2021-06-06 NOTE — Progress Notes (Signed)
Physical Therapy Session Note ? ?Patient Details  ?Name: Devin Roberts ?MRN: [id number] ?Date of Birth: 1960-08-10 ? ?Today's Date: 06/06/2021 ?PT Individual Time: 1300-1400 ?PT Individual Time Calculation (min): 60 min  ? ?Short Term Goals: ?Week 1:  PT Short Term Goal 1 (Week 1): squat pivot w/set up assist only ?PT Short Term Goal 2 (Week 1): wc propulsion x 39f w/cues, additional time. ?PT Short Term Goal 3 (Week 1): Gait 463fw/PFRW NWB RLE w/cga ?PT Short Term Goal 4 (Week 1): mod I bed mobility ?   ? ?Skilled Therapeutic Interventions/Progress Updates:  ?Pt received seated in recliner with BLE's elevated. He was agreeable to therapy and was able to practice all therapy goals using the equipment he will go home with. Pt reports some pain in his R shoulder and LLE but has been controlled at a 3/10 with pain medication. Pt is mod I with R PFRW for all transfers, gait, curbs, and w/c mobility. Independent with bed mobility. Performed SL balance activities on the RLE with CGA for safety. Pt received printout of HEP and denies having any further questions for PT. Pt was transported in W/C to and from therapy gyms as a means of managing pain in his RUE. At end of session, pt was left in recliner with BLE's elevated, an ice pack for RUE, and all needs in reach ? ?Therapy Documentation ?Precautions:  ?Precautions ?Precautions: Back, Fall ?Precaution Booklet Issued: No (Verbal re-education proved) ?Required Braces or Orthoses: Spinal Brace (LSO) ?Spinal Brace: Lumbar corset, Applied in sitting position ?Restrictions ?Weight Bearing Restrictions: Yes ?RUE Weight Bearing: Weight bearing as tolerated ?LLE Weight Bearing: Non weight bearing ?Other Position/Activity Restrictions: LE ROM to tolerance ?General: ?PT Amount of Missed Time (min): 15 Minutes ?PT Missed Treatment Reason: Other (Comment) (Pt fatigue and all therapy goals met)  ? ? ? ?Therapy/Group: Individual Therapy ? ?ReLanetta InchPT ? ?06/06/2021, 4:58 PM  ?

## 2021-06-06 NOTE — Progress Notes (Signed)
?                                                       PROGRESS NOTE ? ? ?Subjective/Complaints: ? ?Pt spoke with worker's comp- is allowed to leave as long as therapy said equipment is done/ok that he got ahold of.  ?  ?Likes new LSO.  ?LBM last 2 days- going regularly.  ? ?ROS: ? ? ?Pt denies SOB, abd pain, CP, N/V/C/D, and vision changes ? ? ? ? ?Objective: ?  ?No results found. ?Recent Labs  ?  06/04/21 ?0522 06/06/21 ?0533  ?WBC 10.8* 9.7  ?HGB 12.6* 11.9*  ?HCT 36.4* 35.3*  ?PLT 294 287  ? ? ?Recent Labs  ?  06/04/21 ?0522 06/06/21 ?0533  ?NA 135 134*  ?K 3.9 3.8  ?CL 97* 95*  ?CO2 31 29  ?GLUCOSE 127* 140*  ?BUN 21* 22*  ?CREATININE 1.04 0.92  ?CALCIUM 9.2 8.7*  ? ? ? ?Intake/Output Summary (Last 24 hours) at 06/06/2021 0841 ?Last data filed at 06/05/2021 2120 ?Gross per 24 hour  ?Intake 118 ml  ?Output 400 ml  ?Net -282 ml  ?  ? ?  ? ?Physical Exam: ?Vital Signs ?Blood pressure 118/80, pulse 81, temperature 98.2 ?F (36.8 ?C), temperature source Oral, resp. rate 18, height 5\' 8"  (1.727 m), SpO2 97 %. ? ? ? ? ? ? ? ? ?General: awake, alert, appropriate, family at bedside; sitting up in w/c at bedside; NAD ?HENT: conjugate gaze; oropharynx moist ?CV: regular rate; no JVD ?Pulmonary: CTA B/L; no W/R/R- good air movement ?GI: soft, NT, ND, (+)BS ?Psychiatric: appropriate ?Neurological: Ox3 ? ?Musculoskeletal: wearing LSO- not TLSO today- looks well fitting- - can wiggle toes on LLE with warm toes- looks stable ?   Comments: LLE splint and surgical dressing in place . Strength and sensation intact. RUE strength and sensation intact ?Skin: ?   General: Skin is warm and dry.  ? ?Assessment/Plan: ?1. Functional deficits which require 3+ hours per day of interdisciplinary therapy in a comprehensive inpatient rehab setting. ?Physiatrist is providing close team supervision and 24 hour management of active medical problems listed below. ?Physiatrist and rehab team continue to assess barriers to discharge/monitor patient  progress toward functional and medical goals ? ?Care Tool: ? ?Bathing ?   ?Body parts bathed by patient: Right arm, Left arm, Chest, Abdomen, Front perineal area, Right upper leg, Left upper leg, Face, Buttocks, Right lower leg  ? Body parts bathed by helper: Right lower leg, Buttocks ?Body parts n/a: Left lower leg ?  ?Bathing assist Assist Level: Supervision/Verbal cueing ?  ?  ?Upper Body Dressing/Undressing ?Upper body dressing   ?What is the patient wearing?: Pull over shirt ?   ?Upper body assist Assist Level: Set up assist ?   ?Lower Body Dressing/Undressing ?Lower body dressing ? ? ?   ?What is the patient wearing?: Pants, Underwear/pull up ? ?  ? ?Lower body assist Assist for lower body dressing: Supervision/Verbal cueing ?   ? ?Toileting ?Toileting Toileting Activity did not occur (AcupuncturistClothing management and hygiene only): N/A (no void or bm)  ?Toileting assist Assist for toileting: Supervision/Verbal cueing ?  ?  ?Transfers ?Chair/bed transfer ? ?Transfers assist ?   ? ?Chair/bed transfer assist level: Independent with assistive device ?  ?  ?Locomotion ?Ambulation ? ? ?Ambulation  assist ? ?   ? ?Assist level: Supervision/Verbal cueing ?Assistive device: Walker-rolling ?Max distance: 5-74ft max today, due to pt not wanting to provoke more pain in his RUE  ? ?Walk 10 feet activity ? ? ?Assist ?   ? ?Assist level: Contact Guard/Touching assist ?Assistive device: Walker-platform  ? ?Walk 50 feet activity ? ? ?Assist   ? ?Assist level: Contact Guard/Touching assist ?Assistive device: Walker-platform  ? ? ?Walk 150 feet activity ? ? ?Assist   ? ?Assist level: Total Assistance - Patient < 25% ?Assistive device: Walker-platform ?  ? ?Walk 10 feet on uneven surface  ?activity ? ? ?Assist Walk 10 feet on uneven surfaces activity did not occur: Safety/medical concerns ? ? ?  ?   ? ?Wheelchair ? ? ? ? ?Assist Is the patient using a wheelchair?: Yes ?Type of Wheelchair: Manual ?  ? ?Wheelchair assist level:  Supervision/Verbal cueing ?Max wheelchair distance: 150'  ? ? ?Wheelchair 50 feet with 2 turns activity ? ? ? ?Assist ? ?  ?  ? ? ?Assist Level: Supervision/Verbal cueing  ? ?Wheelchair 150 feet activity  ? ? ? ?Assist ?   ? ? ?Assist Level: Supervision/Verbal cueing  ? ?Blood pressure 118/80, pulse 81, temperature 98.2 ?F (36.8 ?C), temperature source Oral, resp. rate 18, height 5\' 8"  (1.727 m), SpO2 97 %. ? ?Medical Problem List and Plan: ?1. Functional deficits secondary to polytrauma ?            -patient may shower ?            -ELOS/Goals: S 10-14 days ?            -First day of evaluations- PT and OT- NWB LLE- WBAT RUE- sling for comfort; no ROM restrictions ? D/c 3/16- as long as OK with worker's comp ? Con't CIR for family training- NWB LLE ?2.  Antithrombotics: ?-DVT/anticoagulation:  Pharmaceutical: Lovenox ?            -antiplatelet therapy: None (was on aspirin 81 mg daily prior to admission) ?3. Pain Management: Tylenol as needed, Robaxin 3 times daily, oxycodone as needed ?            -ice and elevate LLE ? 3/9- d/w pt- suggest using 5-10 mg in Am before therapy and at lunch- and then at bedtime- can take more, but at least 3x/day for the next few days.  ? 3/14- pai controlled- con't regimen- ?3/15- will get 7 days of meds- and then needs to f/u with Surgeon for more meds-   ?4. Mood: LCSW to evaluate and provide emotional support ?            -antipsychotic agents: n/a ?5. Neuropsych: This patient is capable of making decisions on his own behalf. ?6. Skin/Wound Care: Routine skin care checks ?            -- Monitor LLE incisions: Sutures out approximately 3/20 ? 3/14- will likely be able to switch to boot then, but he will likely be discharged by then- will need ortho f/u asap ?7. Fluids/Electrolytes/Nutrition: Routine I's and O's and follow-up chemistries ?            --continue carb modified diet ?8:  ORIF left pilon fracture and syndesmosis, left proximal tibia fracture ?            --NWB times 8  weeks ?            --splint times 2 weeks, then CAM boot (3/20) ?            --  unrestricted left knee and hip ROM ?9: right scapular fracture: non-operative repair ?            --sling for comfort ?            --early ROM ?            --can use RUE to help mobilize ?            --may weight bear through RUE ?10: L1, L2 vertebral body compression fractures ?            --continue TSLO when out of bed ?11: Hyperlipidemia: Pravachol (and Vascepa) on home med list but statin listed as allergy/intolerant>>wife says not allergic and has been tolerating so will restart ?12: Hypertension: continue chlorthalidone 25 mg daily ?            --on Edarbyclor 40/25 at home ? 3/10- DBP a little elevated, but SBP controlled- con't regimen ?13: Hyperglycemia with A1c = 6.5 ?            --Continue CBGs QID and SSI ? 3/12- Cbgs 112-155- con't regimen ? 3/13- on no DM meds- will try metformin 500 mg BID with meals- to see if can get better BG control ? 3/14- Cbgs look better- 102-139- not up to 150s-160s today- con't regimen ?14: Constipation: continue scheduled Colace and PRNs ordered ?            -3/9- LBM 6 days ago- will give Sorbitol 45cc at lunch per pt request-  ? 3/10- large BM last night- on toilet- con't regimen ? 3/12- LBM 2 days ago- took sorbitol already today.  ? 3/13- LBM yesterday with sorbitol ?15: Insomnia, chronic: takes Ambien 10 mg every night>>will resume ?16. Obesity BMI 33.5: provide dietary education ? 17. Leukocytosis ? 3/10- WBC 13.9- hasn't come down- will recheck Monday- also U/A negative and no Sx's of illness- no fever.  ? 3/13- WBC down to 10.8- will con't to monitor ?18. Anxiety ? 3/12- was given a dose of Klonopin BID prn- don't want pt to have issues esp since also on pain meds- will start Paxil to get a better control of anxiety/prevent Sx's.  ?19. Dispo ? 3/15- won't need f/u with me- just surgeons for pain meds and to deal with post-op issues.  ? ? ? ?LOS: ?7 days ?A FACE TO FACE EVALUATION WAS  PERFORMED ? ?Letizia Hook ?06/06/2021, 8:41 AM  ? ? ? ?

## 2021-06-06 NOTE — Progress Notes (Signed)
Patient states yesterday's RN went over lovenox administration with spouse. Able to verbally describe procedure, but spouse will administer at hime. Will see if spouse can give PM dose or tomorrow's AM dose prior to discharge.  ?

## 2021-06-06 NOTE — Progress Notes (Addendum)
Occupational Therapy Session Note ? ?Patient Details  ?Name: Devin Roberts ?MRN: [id number] ?Date of Birth: Sep 25, 1960 ? ?Today's Date: 06/06/2021 ?OT Individual Time: 8185-6314 ?OT Individual Time Calculation (min): 68 min  ? ? ?Short Term Goals: ?Week 1:  OT Short Term Goal 1 (Week 1): Patient will complete sit to stand transfers with CGA and LRAD. ?OT Short Term Goal 2 (Week 1): Patient will complete toilet transfers with CGA and LRAD. ?OT Short Term Goal 3 (Week 1): Patient will don LB clothing with CGA, AE and LRAD. ? ?Skilled Therapeutic Interventions/Progress Updates:  ?   ?Pt received in recliner with ADL needs met . No pain. ?ADL: ?Pt completes BM toileting at amb level with MOD I even with ramp negotiation into bathroom. ? ?Therapeutic activity ?OT spends time fitting PF to RW from home to use in case workers comp does not deliver equipment today. Pt uses throughout session along with snug, 16x16 walker. OT required to remove padding added to walker handles of  fam friend. Pt completes functional mobility in atrium, around [employer] practicing for community reentry getting to a regular restaurant chair and booth with s/u. Pt able to weave inside gift shop with [place] and laterally hop both directions with set up.  ?Edu re walker tray for energy conservation. Handout provided. ? ?Pt left at end of session in recliner with exit alarm on, call light in reach and all needs met ? ? ?Therapy Documentation ?Precautions:  ?Precautions ?Precautions: Back, Fall ?Precaution Booklet Issued: No (Verbal re-education proved) ?Required Braces or Orthoses: Spinal Brace, Sling ?Spinal Brace: Thoracolumbosacral orthotic, Applied in supine position ?Restrictions ?Weight Bearing Restrictions: Yes ?RUE Weight Bearing: Weight bearing as tolerated ?LLE Weight Bearing: Non weight bearing ?Other Position/Activity Restrictions: LE ROM to tolerance ?General: ?  ? ?Therapy/Group: Individual Therapy ? ?[name] ?06/06/2021, 6:51  AM ?

## 2021-06-07 DIAGNOSIS — T07XXXA Unspecified multiple injuries, initial encounter: Secondary | ICD-10-CM | POA: Diagnosis not present

## 2021-06-07 LAB — GLUCOSE, CAPILLARY: Glucose-Capillary: 119 mg/dL — ABNORMAL HIGH (ref 70–99)

## 2021-06-07 MED ORDER — ACETAMINOPHEN 325 MG PO TABS: 325.0000 mg | ORAL_TABLET | ORAL | Status: DC | PRN

## 2021-06-07 MED ORDER — ASCORBIC ACID 1000 MG PO TABS: 1000.0000 mg | ORAL_TABLET | Freq: Every day | ORAL | 0 refills | Status: DC

## 2021-06-07 MED ORDER — METHOCARBAMOL 750 MG PO TABS: 750.0000 mg | ORAL_TABLET | Freq: Four times a day (QID) | ORAL | 0 refills | Status: DC

## 2021-06-07 MED ORDER — DIPHENHYDRAMINE HCL 25 MG PO CAPS: 25.0000 mg | ORAL_CAPSULE | Freq: Four times a day (QID) | ORAL | 0 refills | Status: DC | PRN

## 2021-06-07 MED ORDER — PAROXETINE HCL 20 MG PO TABS: 20.0000 mg | ORAL_TABLET | Freq: Every day | ORAL | 0 refills | Status: DC

## 2021-06-07 MED ORDER — OXYCODONE HCL 5 MG PO TABS: 5.0000 mg | ORAL_TABLET | ORAL | 0 refills | Status: DC | PRN

## 2021-06-07 MED ORDER — CHOLECALCIFEROL 50 MCG (2000 UT) PO TABS: 2.0000 | ORAL_TABLET | Freq: Every day | ORAL | 1 refills | Status: DC

## 2021-06-07 MED ORDER — DOCUSATE SODIUM 100 MG PO CAPS: 100.0000 mg | ORAL_CAPSULE | Freq: Two times a day (BID) | ORAL | 0 refills | Status: DC

## 2021-06-07 MED ORDER — ENOXAPARIN SODIUM 40 MG/0.4ML IJ SOSY: 40.0000 mg | PREFILLED_SYRINGE | Freq: Two times a day (BID) | INTRAMUSCULAR | 0 refills | Status: DC

## 2021-06-07 MED ORDER — METFORMIN HCL 500 MG PO TABS: 500.0000 mg | ORAL_TABLET | Freq: Two times a day (BID) | ORAL | 0 refills | Status: DC

## 2021-06-07 MED ORDER — CLONAZEPAM 0.125 MG PO TBDP: 0.1250 mg | ORAL_TABLET | Freq: Two times a day (BID) | ORAL | 0 refills | Status: DC | PRN

## 2021-06-07 NOTE — Progress Notes (Signed)
Patient ID: Devin Roberts, male   DOB: 05/26/1960, 61 y.o.   MRN: [id number] ? ?06/06/2020-Per [facility], pt has appt with Dr. Carola Frost for 06/13/21 at 10:00 a.m. ? ?06/07/2021- SW spoke with representative/[employer] ?(p:[phone]/[phone]) to check status of DME. States that DME will be coming for [facility] supply but no answer on when DME will be delivered. SW shared information with [facility]. Clinicals faxed.  ? ?Cecile Sheerer, MSW, LCSWA ?Office: [phone] ?Cell: [phone] ?Fax: [phone]   ?

## 2021-06-07 NOTE — Progress Notes (Signed)
Inpatient Rehabilitation Care Coordinator ?Discharge Note  ? ?Patient Details  ?Name: Devin Roberts ?MRN: [id number] ?Date of Birth: May 16, 1960 ? ? ?Discharge location: D/c to home ? ?Length of Stay: 7 days ? ?Discharge activity level: discharge at  mod I for short distance ambulation, otherwise mod I for wheelchair ? ?Home/community participation: Limited ? ?Patient response QB:VQXIHW Literacy - How often do you need to have someone help you when you read instructions, pamphlets, or other written material from your doctor or pharmacy?: Never ? ?Patient response TU:UEKCMK Isolation - How often do you feel lonely or isolated from those around you?: Never ? ?Services provided included: MD, RD, PT, OT, RN, TR, CM, Pharmacy, SW, Neuropsych ? ?Financial Services:  ?Charity fundraiser Utilized: Worker's Comp ?  ? ?Choices offered to/list presented to: Yes ? ?Follow-up services arranged:  ?Outpatient, DME ?   ?Outpatient Servicies: Outpatient PT/OT- being arranged by Worker's Comp ?DME : w/c with ELRs, R platform RW, HIP kit, 3in1 BSC, grab bars for shower (suction or installed), shower seat with armests- being managed by worker's comp ?  ? ?Patient response to transportation need: ?Is the patient able to respond to transportation needs?: Yes ?In the past 12 months, has lack of transportation kept you from medical appointments or from getting medications?: No ?In the past 12 months, has lack of transportation kept you from meetings, work, or from getting things needed for daily living?: No ? ?Comments (or additional information): ? ?Patient/Family verbalized understanding of follow-up arrangements:  Yes ? ?Individual responsible for coordination of the follow-up plan: contact pt or pt wife Renee ? ?Confirmed correct DME delivered: Rana Snare 06/07/2021   ? ?Rana Snare ?

## 2021-06-07 NOTE — Progress Notes (Signed)
?                                                       PROGRESS NOTE ? ? ?Subjective/Complaints: ? ?Pt ready for d/c- no complaints- has all equipment figured out.  ? ? ? ?ROS: ? ?Pt denies SOB, abd pain, CP, N/V/C/D, and vision changes ? ? ? ? ?Objective: ?  ?No results found. ?Recent Labs  ?  06/06/21 ?0533  ?WBC 9.7  ?HGB 11.9*  ?HCT 35.3*  ?PLT 287  ? ? ?Recent Labs  ?  06/06/21 ?0533  ?NA 134*  ?K 3.8  ?CL 95*  ?CO2 29  ?GLUCOSE 140*  ?BUN 22*  ?CREATININE 0.92  ?CALCIUM 8.7*  ? ? ? ?Intake/Output Summary (Last 24 hours) at 06/07/2021 0817 ?Last data filed at 06/07/2021 0815 ?Gross per 24 hour  ?Intake 840 ml  ?Output --  ?Net 840 ml  ?  ? ?  ? ?Physical Exam: ?Vital Signs ?Blood pressure 117/87, pulse 75, temperature 98.7 ?F (37.1 ?C), resp. rate 18, height 5\' 8"  (1.727 m), SpO2 97 %. ? ? ? ? ? ? ? ? ? ?General: awake, alert, appropriate, sitting up in bed; wife at bedside; NAD ?HENT: conjugate gaze; oropharynx moist ?CV: regular rate; no JVD ?Pulmonary: CTA B/L; no W/R/R- good air movement ?GI: soft, NT, ND, (+)BS ?Psychiatric: appropriate ?Neurological: Ox3 ? ?Musculoskeletal: wearing LSO- not TLSO today- looks well fitting- - can wiggle toes on LLE with warm toes- looks stable ?   Comments: LLE splint and surgical dressing in place . Strength and sensation intact. RUE strength and sensation intact ?Skin: ?   General: Skin is warm and dry.  ? ?Assessment/Plan: ?1. Functional deficits which require 3+ hours per day of interdisciplinary therapy in a comprehensive inpatient rehab setting. ?Physiatrist is providing close team supervision and 24 hour management of active medical problems listed below. ?Physiatrist and rehab team continue to assess barriers to discharge/monitor patient progress toward functional and medical goals ? ?Care Tool: ? ?Bathing ?   ?Body parts bathed by patient: Right arm, Left arm, Chest, Abdomen, Front perineal area, Right upper leg, Left upper leg, Face, Buttocks, Right lower leg  ?  Body parts bathed by helper: Right lower leg, Buttocks ?Body parts n/a: Left lower leg ?  ?Bathing assist Assist Level: Supervision/Verbal cueing ?  ?  ?Upper Body Dressing/Undressing ?Upper body dressing   ?What is the patient wearing?: Pull over shirt ?   ?Upper body assist Assist Level: Set up assist ?   ?Lower Body Dressing/Undressing ?Lower body dressing ? ? ?   ?What is the patient wearing?: Pants, Underwear/pull up ? ?  ? ?Lower body assist Assist for lower body dressing: Supervision/Verbal cueing ?   ? ?Toileting ?Toileting Toileting Activity did not occur (Probation officer and hygiene only): N/A (no void or bm)  ?Toileting assist Assist for toileting: Independent with assistive device ?  ?  ?Transfers ?Chair/bed transfer ? ?Transfers assist ?   ? ?Chair/bed transfer assist level: Independent with assistive device ?  ?  ?Locomotion ?Ambulation ? ? ?Ambulation assist ? ?   ? ?Assist level: Independent with assistive device ?Assistive device: Walker-platform ?Max distance: ~113ft  ? ?Walk 10 feet activity ? ? ?Assist ?   ? ?Assist level: Independent with assistive device ?Assistive device: Walker-platform  ? ?  Walk 50 feet activity ? ? ?Assist   ? ?Assist level: Independent with assistive device ?Assistive device: Walker-platform  ? ? ?Walk 150 feet activity ? ? ?Assist Walk 150 feet activity did not occur: Safety/medical concerns ? ?Assist level: Total Assistance - Patient < 25% ?Assistive device: Walker-platform ?  ? ?Walk 10 feet on uneven surface  ?activity ? ? ?Assist Walk 10 feet on uneven surfaces activity did not occur: Safety/medical concerns ? ? ?Assist level: Supervision/Verbal cueing ?Assistive device: Walker-platform  ? ?Wheelchair ? ? ? ? ?Assist Is the patient using a wheelchair?: Yes ?Type of Wheelchair: Manual ?  ? ?Wheelchair assist level: Independent ?Max wheelchair distance: 164ft  ? ? ?Wheelchair 50 feet with 2 turns activity ? ? ? ?Assist ? ?  ?  ? ? ?Assist Level: Independent   ? ?Wheelchair 150 feet activity  ? ? ? ?Assist ?   ? ? ?Assist Level: Independent  ? ?Blood pressure 117/87, pulse 75, temperature 98.7 ?F (37.1 ?C), resp. rate 18, height 5\' 8"  (1.727 m), SpO2 97 %. ? ?Medical Problem List and Plan: ?1. Functional deficits secondary to polytrauma ?            -patient may shower ?            -ELOS/Goals: S 10-14 days ?            -First day of evaluations- PT and OT- NWB LLE- WBAT RUE- sling for comfort; no ROM restrictions ? D/c 3/16- as long as OK with worker's comp ? Con't CIR for family training- NWB LLE ? D/c today ?2.  Antithrombotics: ?-DVT/anticoagulation:  Pharmaceutical: Lovenox ?            -antiplatelet therapy: None (was on aspirin 81 mg daily prior to admission) ?3. Pain Management: Tylenol as needed, Robaxin 3 times daily, oxycodone as needed ?            -ice and elevate LLE ? 3/9- d/w pt- suggest using 5-10 mg in Am before therapy and at lunch- and then at bedtime- can take more, but at least 3x/day for the next few days.  ? 3/14- pai controlled- con't regimen- ?3/16- will get 7 days of meds- and then needs to f/u with Surgeon for more meds-  d/w PA ?4. Mood: LCSW to evaluate and provide emotional support ?            -antipsychotic agents: n/a ?5. Neuropsych: This patient is capable of making decisions on his own behalf. ?6. Skin/Wound Care: Routine skin care checks ?            -- Monitor LLE incisions: Sutures out approximately 3/20 ? 3/14- will likely be able to switch to boot then, but he will likely be discharged by then- will need ortho f/u asap ? 3/16- has appt with Ortho- needs NSU appt ?7. Fluids/Electrolytes/Nutrition: Routine I's and O's and follow-up chemistries ?            --continue carb modified diet ?8:  ORIF left pilon fracture and syndesmosis, left proximal tibia fracture ?            --NWB times 8 weeks ?            --splint times 2 weeks, then CAM boot (3/20) ?            --unrestricted left knee and hip ROM ?9: right scapular fracture:  non-operative repair ?            --  sling for comfort ?            --early ROM ?            --can use RUE to help mobilize ?            --may weight bear through RUE ?10: L1, L2 vertebral body compression fractures ?            --continue TSLO when out of bed ?11: Hyperlipidemia: Pravachol (and Vascepa) on home med list but statin listed as allergy/intolerant>>wife says not allergic and has been tolerating so will restart ?12: Hypertension: continue chlorthalidone 25 mg daily ?            --on Edarbyclor 40/25 at home ? 3/10- DBP a little elevated, but SBP controlled- con't regimen ?13: Hyperglycemia with A1c = 6.5 ?            --Continue CBGs QID and SSI ? 3/12- Cbgs 112-155- con't regimen ? 3/13- on no DM meds- will try metformin 500 mg BID with meals- to see if can get better BG control ? 3/14- Cbgs look better- 102-139- not up to 150s-160s today- con't regimen ?14: Constipation: continue scheduled Colace and PRNs ordered ?            -3/9- LBM 6 days ago- will give Sorbitol 45cc at lunch per pt request-  ? 3/10- large BM last night- on toilet- con't regimen ? 3/12- LBM 2 days ago- took sorbitol already today.  ? 3/13- LBM yesterday with sorbitol ?15: Insomnia, chronic: takes Ambien 10 mg every night>>will resume ?16. Obesity BMI 33.5: provide dietary education ? 17. Leukocytosis ? 3/10- WBC 13.9- hasn't come down- will recheck Monday- also U/A negative and no Sx's of illness- no fever.  ? 3/13- WBC down to 10.8- will con't to monitor ?18. Anxiety ? 3/12- was given a dose of Klonopin BID prn- don't want pt to have issues esp since also on pain meds- will start Paxil to get a better control of anxiety/prevent Sx's.  ?19. Dispo ? 3/15- won't need f/u with me- just surgeons for pain meds and to deal with post-op issues.  ? ? ? ?LOS: ?8 days ?A FACE TO FACE EVALUATION WAS PERFORMED ? ?Shemicka Cohrs ?06/07/2021, 8:17 AM  ? ? ? ?

## 2021-06-11 ENCOUNTER — Telehealth: Payer: Self-pay

## 2021-06-11 NOTE — Telephone Encounter (Signed)
Key: [id number] ? ? ?

## 2021-06-11 NOTE — Telephone Encounter (Signed)
approved through 06/12/2022.  ?

## 2021-07-04 ENCOUNTER — Other Ambulatory Visit: Payer: Self-pay | Admitting: Internal Medicine

## 2021-07-04 DIAGNOSIS — E118 Type 2 diabetes mellitus with unspecified complications: Secondary | ICD-10-CM

## 2021-07-04 DIAGNOSIS — F411 Generalized anxiety disorder: Secondary | ICD-10-CM

## 2021-07-04 DIAGNOSIS — F5104 Psychophysiologic insomnia: Secondary | ICD-10-CM

## 2021-07-04 DIAGNOSIS — K5901 Slow transit constipation: Secondary | ICD-10-CM

## 2021-07-09 ENCOUNTER — Other Ambulatory Visit: Payer: Self-pay | Admitting: Internal Medicine

## 2021-07-13 ENCOUNTER — Inpatient Hospital Stay: Payer: Self-pay | Admitting: Registered Nurse

## 2021-07-26 ENCOUNTER — Other Ambulatory Visit: Payer: Self-pay | Admitting: Internal Medicine

## 2021-07-26 DIAGNOSIS — I10 Essential (primary) hypertension: Secondary | ICD-10-CM

## 2021-08-06 ENCOUNTER — Other Ambulatory Visit: Payer: Self-pay | Admitting: Internal Medicine

## 2021-08-06 DIAGNOSIS — F5104 Psychophysiologic insomnia: Secondary | ICD-10-CM

## 2021-08-06 DIAGNOSIS — K5901 Slow transit constipation: Secondary | ICD-10-CM

## 2021-08-06 DIAGNOSIS — E118 Type 2 diabetes mellitus with unspecified complications: Secondary | ICD-10-CM

## 2021-08-08 ENCOUNTER — Encounter: Payer: Self-pay | Admitting: Internal Medicine

## 2021-08-08 ENCOUNTER — Ambulatory Visit: Payer: PRIVATE HEALTH INSURANCE | Admitting: Internal Medicine

## 2021-08-08 VITALS — BP 132/86 | HR 76 | Temp 98.0°F | Resp 16 | Ht 68.0 in | Wt 221.0 lb

## 2021-08-08 DIAGNOSIS — E785 Hyperlipidemia, unspecified: Secondary | ICD-10-CM | POA: Diagnosis not present

## 2021-08-08 DIAGNOSIS — I10 Essential (primary) hypertension: Secondary | ICD-10-CM

## 2021-08-08 DIAGNOSIS — E781 Pure hyperglyceridemia: Secondary | ICD-10-CM | POA: Diagnosis not present

## 2021-08-08 DIAGNOSIS — E118 Type 2 diabetes mellitus with unspecified complications: Secondary | ICD-10-CM

## 2021-08-08 DIAGNOSIS — Z0000 Encounter for general adult medical examination without abnormal findings: Secondary | ICD-10-CM

## 2021-08-08 DIAGNOSIS — Z0001 Encounter for general adult medical examination with abnormal findings: Secondary | ICD-10-CM

## 2021-08-08 LAB — BASIC METABOLIC PANEL
BUN: 15 mg/dL (ref 6–23)
CO2: 32 mEq/L (ref 19–32)
Calcium: 9.9 mg/dL (ref 8.4–10.5)
Chloride: 100 mEq/L (ref 96–112)
Creatinine, Ser: 1.16 mg/dL (ref 0.40–1.50)
GFR: 68.98 mL/min (ref >60.00)
Glucose, Bld: 97 mg/dL (ref 70–99)
Potassium: 4.2 mEq/L (ref 3.5–5.1)
Sodium: 141 mEq/L (ref 135–145)

## 2021-08-08 LAB — TSH: TSH: 0.9 u[IU]/mL (ref 0.35–5.50)

## 2021-08-08 LAB — CBC WITH DIFFERENTIAL/PLATELET
Basophils Absolute: 0.1 10*3/uL (ref 0.0–0.1)
Basophils Relative: 1 % (ref 0.0–3.0)
Eosinophils Absolute: 0.2 10*3/uL (ref 0.0–0.7)
Eosinophils Relative: 1.6 % (ref 0.0–5.0)
HCT: 44.5 % (ref 39.0–52.0)
Hemoglobin: 15.3 g/dL (ref 13.0–17.0)
Lymphocytes Relative: 21.7 % (ref 12.0–46.0)
Lymphs Abs: 2.1 10*3/uL (ref 0.7–4.0)
MCHC: 34.3 g/dL (ref 30.0–36.0)
MCV: 94.2 fl (ref 78.0–100.0)
Monocytes Absolute: 0.8 10*3/uL (ref 0.1–1.0)
Monocytes Relative: 8.1 % (ref 3.0–12.0)
Neutro Abs: 6.6 10*3/uL (ref 1.4–7.7)
Neutrophils Relative %: 67.6 % (ref 43.0–77.0)
Platelets: 247 10*3/uL (ref 150.0–400.0)
RBC: 4.73 Mil/uL (ref 4.22–5.81)
RDW: 13.2 % (ref 11.5–15.5)
WBC: 9.8 10*3/uL (ref 4.0–10.5)

## 2021-08-08 LAB — LIPID PANEL
Cholesterol: 118 mg/dL (ref 0–200)
HDL: 39.3 mg/dL (ref >39.00)
LDL Cholesterol: 65 mg/dL (ref 0–99)
NonHDL: 78.56
Total CHOL/HDL Ratio: 3
Triglycerides: 69 mg/dL (ref 0.0–149.0)
VLDL: 13.8 mg/dL (ref 0.0–40.0)

## 2021-08-08 LAB — MICROALBUMIN / CREATININE URINE RATIO
Creatinine,U: 258.8 mg/dL
Microalb Creat Ratio: 0.6 mg/g (ref 0.0–30.0)
Microalb, Ur: 1.5 mg/dL (ref 0.0–1.9)

## 2021-08-08 LAB — PSA: PSA: 1.92 ng/mL (ref 0.10–4.00)

## 2021-08-08 NOTE — Patient Instructions (Signed)
Health Maintenance, Male Adopting a healthy lifestyle and getting preventive care are important in promoting health and wellness. Ask your health care provider about: The right schedule for you to have regular tests and exams. Things you can do on your own to prevent diseases and keep yourself healthy. What should I know about diet, weight, and exercise? Eat a healthy diet  Eat a diet that includes plenty of vegetables, fruits, low-fat dairy products, and lean protein. Do not eat a lot of foods that are high in solid fats, added sugars, or sodium. Maintain a healthy weight Body mass index (BMI) is a measurement that can be used to identify possible weight problems. It estimates body fat based on height and weight. Your health care provider can help determine your BMI and help you achieve or maintain a healthy weight. Get regular exercise Get regular exercise. This is one of the most important things you can do for your health. Most adults should: Exercise for at least 150 minutes each week. The exercise should increase your heart rate and make you sweat (moderate-intensity exercise). Do strengthening exercises at least twice a week. This is in addition to the moderate-intensity exercise. Spend less time sitting. Even light physical activity can be beneficial. Watch cholesterol and blood lipids Have your blood tested for lipids and cholesterol at 60 years of age, then have this test every 5 years. You may need to have your cholesterol levels checked more often if: Your lipid or cholesterol levels are high. You are older than 60 years of age. You are at high risk for heart disease. What should I know about cancer screening? Many types of cancers can be detected early and may often be prevented. Depending on your health history and family history, you may need to have cancer screening at various ages. This may include screening for: Colorectal cancer. Prostate cancer. Skin cancer. Lung  cancer. What should I know about heart disease, diabetes, and high blood pressure? Blood pressure and heart disease High blood pressure causes heart disease and increases the risk of stroke. This is more likely to develop in people who have high blood pressure readings or are overweight. Talk with your health care provider about your target blood pressure readings. Have your blood pressure checked: Every 3-5 years if you are 18-39 years of age. Every year if you are 40 years old or older. If you are between the ages of 65 and 75 and are a current or former smoker, ask your health care provider if you should have a one-time screening for abdominal aortic aneurysm (AAA). Diabetes Have regular diabetes screenings. This checks your fasting blood sugar level. Have the screening done: Once every three years after age 45 if you are at a normal weight and have a low risk for diabetes. More often and at a younger age if you are overweight or have a high risk for diabetes. What should I know about preventing infection? Hepatitis B If you have a higher risk for hepatitis B, you should be screened for this virus. Talk with your health care provider to find out if you are at risk for hepatitis B infection. Hepatitis C Blood testing is recommended for: Everyone born from 1945 through 1965. Anyone with known risk factors for hepatitis C. Sexually transmitted infections (STIs) You should be screened each year for STIs, including gonorrhea and chlamydia, if: You are sexually active and are younger than 60 years of age. You are older than 60 years of age and your   health care provider tells you that you are at risk for this type of infection. Your sexual activity has changed since you were last screened, and you are at increased risk for chlamydia or gonorrhea. Ask your health care provider if you are at risk. Ask your health care provider about whether you are at high risk for HIV. Your health care provider  may recommend a prescription medicine to help prevent HIV infection. If you choose to take medicine to prevent HIV, you should first get tested for HIV. You should then be tested every 3 months for as long as you are taking the medicine. Follow these instructions at home: Alcohol use Do not drink alcohol if your health care provider tells you not to drink. If you drink alcohol: Limit how much you have to 0-2 drinks a day. Know how much alcohol is in your drink. In the [place] one drink equals one 12 oz bottle of beer (355 mL), one 5 oz glass of wine (148 mL), or one 1 oz glass of hard liquor (44 mL). Lifestyle Do not use any products that contain nicotine or tobacco. These products include cigarettes, chewing tobacco, and vaping devices, such as e-cigarettes. If you need help quitting, ask your health care provider. Do not use street drugs. Do not share needles. Ask your health care provider for help if you need support or information about quitting drugs. General instructions Schedule regular health, dental, and eye exams. Stay current with your vaccines. Tell your health care provider if: You often feel depressed. You have ever been abused or do not feel safe at home. Summary Adopting a healthy lifestyle and getting preventive care are important in promoting health and wellness. Follow your health care provider's instructions about healthy diet, exercising, and getting tested or screened for diseases. Follow your health care provider's instructions on monitoring your cholesterol and blood pressure. This information is not intended to replace advice given to you by your health care provider. Make sure you discuss any questions you have with your health care provider. Document Revised: 07/31/2020 Document Reviewed: 07/31/2020 Elsevier Patient Education  2023 Elsevier Inc.  

## 2021-08-08 NOTE — Progress Notes (Signed)
Subjective:  Patient ID: Devin Roberts, male    DOB: 10/13/1960  Age: 61 y.o. MRN: [id number]  CC: Annual Exam, Hypertension, Hyperlipidemia, and Diabetes   HPI Devin Roberts presents for a CPX and f/up -  He is recovering from major trauma. He is active and denies DOE, SOB, CP, or edema.  Outpatient Medications Prior to Visit  Medication Sig Dispense Refill   acetaminophen (TYLENOL) 325 MG tablet Take 1-2 tablets (325-650 mg total) by mouth every 4 (four) hours as needed for mild pain.     Ascorbic Acid (VITAMIN C) 1000 MG tablet Take 1 tablet (1,000 mg total) by mouth daily. 100 tablet 0   aspirin EC 81 MG tablet Take 1 tablet (81 mg total) by mouth daily. 90 tablet 3   Cholecalciferol 50 MCG (2000 UT) TABS Take 2 tablets (4,000 Units total) by mouth daily. 180 tablet 1   clonazepam (KLONOPIN) 0.125 MG disintegrating tablet Take 1 tablet (0.125 mg total) by mouth 2 (two) times daily as needed for seizure. 60 tablet 0   docusate sodium (COLACE) 100 MG capsule Take 1 capsule (100 mg total) by mouth 2 (two) times daily. 60 capsule 0   EDARBYCLOR 40-25 MG TABS Take 1 tablet by mouth daily. 90 tablet 0   icosapent Ethyl (VASCEPA) 1 g capsule TAKE (2) CAPSULES BY MOUTH TWICE DAILY. (Patient taking differently: 1 g at bedtime.) 360 capsule 1   methocarbamol (ROBAXIN) 750 MG tablet Take 1 tablet (750 mg total) by mouth 4 (four) times daily. 120 tablet 0   PARoxetine (PAXIL) 20 MG tablet Take 1 tablet (20 mg total) by mouth at bedtime. 30 tablet 0   pravastatin (PRAVACHOL) 40 MG tablet Take 1 tablet (40 mg total) by mouth at bedtime. 90 tablet 1   zolpidem (AMBIEN) 10 MG tablet Take 1 tablet (10 mg total) by mouth at bedtime. 90 tablet 0   metFORMIN (GLUCOPHAGE) 500 MG tablet Take 1 tablet (500 mg total) by mouth 2 (two) times daily with a meal. 60 tablet 0   diphenhydrAMINE (BENADRYL) 25 mg capsule Take 1 capsule (25 mg total) by mouth every 6 (six) hours as needed for itching. 30 capsule 0    enoxaparin (LOVENOX) 40 MG/0.4ML injection Inject 0.4 mLs (40 mg total) into the skin every 12 (twelve) hours for 11 days. Needs a total of 22 syringes 0.4 mg each 8.8 mL 0   oxyCODONE (OXY IR/ROXICODONE) 5 MG immediate release tablet Take 1-2 tablets (5-10 mg total) by mouth every 4 (four) hours as needed for moderate pain. 30 tablet 0   No facility-administered medications prior to visit.    ROS Review of Systems  Constitutional:  Negative for appetite change, diaphoresis, fatigue and unexpected weight change.  HENT: Negative.    Eyes: Negative.   Respiratory:  Negative for cough, chest tightness, shortness of breath and wheezing.   Cardiovascular:  Negative for chest pain, palpitations and leg swelling.  Gastrointestinal:  Negative for abdominal pain, constipation, diarrhea, nausea and vomiting.  Endocrine: Negative.   Genitourinary: Negative.  Negative for difficulty urinating.  Musculoskeletal:  Positive for back pain. Negative for arthralgias and myalgias.  Skin: Negative.   Neurological:  Negative for dizziness, weakness and light-headedness.  Hematological:  Negative for adenopathy. Does not bruise/bleed easily.  Psychiatric/Behavioral: Negative.     Objective:  BP 132/86 (BP Location: Right Arm, Patient Position: Sitting, Cuff Size: Large)   Pulse 76   Temp 98 F (36.7 C) (Oral)  Resp 16   Ht 5\' 8"  (1.727 m)   Wt 221 lb (100.2 kg)   SpO2 96%   BMI 33.60 kg/m   BP Readings from Last 3 Encounters:  08/08/21 132/86  06/07/21 117/87  05/30/21 139/81    Wt Readings from Last 3 Encounters:  08/08/21 221 lb (100.2 kg)  05/28/21 222 lb (100.7 kg)  10/10/20 218 lb (98.9 kg)    Physical Exam Vitals reviewed.  HENT:     Nose: Nose normal.     Mouth/Throat:     Mouth: Mucous membranes are moist.  Eyes:     General: No scleral icterus.    Conjunctiva/sclera: Conjunctivae normal.  Cardiovascular:     Rate and Rhythm: Normal rate and regular rhythm.     Heart  sounds: No murmur heard. Pulmonary:     Effort: Pulmonary effort is normal.     Breath sounds: No stridor. No wheezing, rhonchi or rales.  Abdominal:     General: Abdomen is flat.     Palpations: There is no mass.     Tenderness: There is no abdominal tenderness. There is no guarding or rebound.     Hernia: No hernia is present.  Musculoskeletal:        General: Normal range of motion.     Cervical back: Neck supple.     Right lower leg: No edema.     Left lower leg: No edema.  Lymphadenopathy:     Cervical: No cervical adenopathy.  Skin:    General: Skin is warm and dry.  Neurological:     General: No focal deficit present.     Mental Status: He is alert. Mental status is at baseline.  Psychiatric:        Mood and Affect: Mood normal.        Behavior: Behavior normal.    Lab Results  Component Value Date   WBC 9.8 08/08/2021   HGB 15.3 08/08/2021   HCT 44.5 08/08/2021   PLT 247.0 08/08/2021   GLUCOSE 97 08/08/2021   CHOL 118 08/08/2021   TRIG 69.0 08/08/2021   HDL 39.30 08/08/2021   LDLDIRECT 66.0 11/16/2018   LDLCALC 65 08/08/2021   ALT 16 05/31/2021   AST 19 05/31/2021   NA 141 08/08/2021   K 4.2 08/08/2021   CL 100 08/08/2021   CREATININE 1.16 08/08/2021   BUN 15 08/08/2021   CO2 32 08/08/2021   TSH 0.90 08/08/2021   PSA 1.92 08/08/2021   HGBA1C 6.5 (H) 05/26/2021   MICROALBUR 1.5 08/08/2021    No results found.  Assessment & Plan:   Devin Roberts was seen today for annual exam, hypertension, hyperlipidemia and diabetes.  Diagnoses and all orders for this visit:  Essential hypertension- His BP is adequately well controlled. -     Basic metabolic panel; Future -     Urinalysis, Routine w reflex microscopic; Future -     TSH; Future -     CBC with Differential/Platelet; Future  Type II diabetes mellitus with manifestations (HCC)- His blood sugar is well controlled. -     Basic metabolic panel; Future -     Microalbumin / creatinine urine ratio; Future -      HM Diabetes Foot Exam  Pure hypertriglyceridemia- Trigs are normal. -     Lipid panel; Future  Hyperlipidemia with target LDL less than 100- LDL goal achieved. Doing well on the statin  -     Lipid panel; Future -  TSH; Future  Encounter for general adult medical examination with abnormal findings- Exam completed, labs reviewed, her refused pneumonia vaccine and shingles vaccines today, cancer screenings are UTD, pt ed material was given. -     PSA; Future   I have discontinued Devin Roberts's diphenhydrAMINE, enoxaparin, oxyCODONE, and metFORMIN. I am also having him maintain his aspirin EC, pravastatin, icosapent Ethyl, acetaminophen, Cholecalciferol, PARoxetine, vitamin C, clonazepam, methocarbamol, Edarbyclor, docusate sodium, and zolpidem.  No orders of the defined types were placed in this encounter.    Follow-up: Return in about 6 months (around 02/08/2022).  Sanda Linger, MD

## 2021-08-09 LAB — URINALYSIS, ROUTINE W REFLEX MICROSCOPIC
Bilirubin Urine: NEGATIVE
Hgb urine dipstick: NEGATIVE
Leukocytes,Ua: NEGATIVE
Nitrite: NEGATIVE
RBC / HPF: NONE SEEN (ref 0–?)
Specific Gravity, Urine: 1.02 (ref 1.000–1.030)
Total Protein, Urine: NEGATIVE
Urine Glucose: NEGATIVE
Urobilinogen, UA: 1 (ref 0.0–1.0)
pH: 6.5 (ref 5.0–8.0)

## 2021-08-10 ENCOUNTER — Other Ambulatory Visit: Payer: Self-pay | Admitting: Neurological Surgery

## 2021-08-10 DIAGNOSIS — S32028D Other fracture of second lumbar vertebra, subsequent encounter for fracture with routine healing: Secondary | ICD-10-CM

## 2021-08-22 ENCOUNTER — Other Ambulatory Visit: Payer: Self-pay | Admitting: Internal Medicine

## 2021-08-22 DIAGNOSIS — E785 Hyperlipidemia, unspecified: Secondary | ICD-10-CM

## 2021-08-23 ENCOUNTER — Telehealth: Payer: Self-pay | Admitting: Internal Medicine

## 2021-08-23 NOTE — Telephone Encounter (Signed)
Patient's wife was diagnosed with pneumonia - she wants to know if husband should be prescribed antibiotics since they are in close contact.  Please advise

## 2021-08-24 NOTE — Telephone Encounter (Signed)
Called pt's wife, Renee, LVM to discuss.

## 2021-08-29 ENCOUNTER — Other Ambulatory Visit: Payer: PRIVATE HEALTH INSURANCE

## 2021-08-30 ENCOUNTER — Ambulatory Visit: Payer: PRIVATE HEALTH INSURANCE | Admitting: Podiatry

## 2021-08-30 ENCOUNTER — Encounter: Payer: Self-pay | Admitting: Podiatry

## 2021-08-30 ENCOUNTER — Ambulatory Visit: Payer: PRIVATE HEALTH INSURANCE

## 2021-08-30 DIAGNOSIS — M7661 Achilles tendinitis, right leg: Secondary | ICD-10-CM

## 2021-08-30 DIAGNOSIS — M7662 Achilles tendinitis, left leg: Secondary | ICD-10-CM

## 2021-08-30 DIAGNOSIS — S82892A Other fracture of left lower leg, initial encounter for closed fracture: Secondary | ICD-10-CM

## 2021-08-30 NOTE — Progress Notes (Signed)
SITUATION Reason for Consult: Evaluation for Bilateral Custom Foot Orthoses Patient / Caregiver Report: Patient is ready for foot orthotics  OBJECTIVE DATA: Patient History / Diagnosis:    ICD-10-CM   1. Achilles tendinitis of left lower extremity  M76.62       Current or Previous Devices:   Current user  Foot Examination: Skin presentation:   Intact Ulcers & Callousing:   None Toe / Foot Deformities:  Ankle fixaction Weight Bearing Presentation:  Intact Sensation:    Rectus  Shoe Size:    10.11M  ORTHOTIC RECOMMENDATION Recommended Device: 1x pair of custom functional foot orthotics  GOALS OF ORTHOSES - Reduce Pain - Prevent Foot Deformity - Prevent Progression of Further Foot Deformity - Relieve Pressure - Improve the Overall Biomechanical Function of the Foot and Lower Extremity.  ACTIONS PERFORMED Potential out of pocket cost was communicated to patient. Patient understood and consent to casting. Patient was casted for Foot Orthoses via crush box. Procedure was explained and patient tolerated procedure well. Casts were shipped to central fabrication. All questions were answered and concerns addressed.  PLAN Patient is to be called for fitting when devices are ready.

## 2021-08-30 NOTE — Progress Notes (Signed)
Subjective:   Patient ID: Devin Roberts, male   DOB: 61 y.o.   MRN: [id number]   HPI Patient presents stating that he is getting occasional discomfort in his Achilles the orthotics do a good job but its been 5 years since he has had a pair and he also fractured his left ankle in March and has had repair done and is out of work with back   ROS      Objective:  Physical Exam  Neurovascular status intact with patient's left ankle showing incisions from what appears to be trimalleolar fracture which was fixed in the beginning of March.  He does get discomfort the posterior heel left over right is current currently on crutches     Assessment:  Chronic Achilles tendinitis kept under control with orthotics and stretching exercises along with fracture left ankle appears to be healing well     Plan:  H&P reviewed condition and satisfied that he is progressing as far as his ankle goes and went ahead today and we casted him for functional orthotics by pedorthist which will be dealt with heel lift bilateral.  Patient to be seen back to recheck when ready

## 2021-09-05 ENCOUNTER — Other Ambulatory Visit: Payer: PRIVATE HEALTH INSURANCE

## 2021-09-06 ENCOUNTER — Ambulatory Visit
Admission: RE | Admit: 2021-09-06 | Discharge: 2021-09-06 | Disposition: A | Discharge status: Home / Self Care | Payer: Self-pay | Source: Ambulatory Visit | Attending: Neurological Surgery | Admitting: Neurological Surgery

## 2021-09-06 DIAGNOSIS — S32028D Other fracture of second lumbar vertebra, subsequent encounter for fracture with routine healing: Secondary | ICD-10-CM

## 2021-09-08 ENCOUNTER — Other Ambulatory Visit: Payer: Self-pay | Admitting: Internal Medicine

## 2021-09-08 DIAGNOSIS — K5901 Slow transit constipation: Secondary | ICD-10-CM

## 2021-09-25 ENCOUNTER — Encounter: Payer: Self-pay | Admitting: Internal Medicine

## 2021-09-27 ENCOUNTER — Telehealth: Payer: Self-pay | Admitting: *Deleted

## 2021-09-27 NOTE — Telephone Encounter (Signed)
Rec'd paper fax pt need PA on Vascepa. Submitted via cover-my-meds w/ ([id number]) Rec'd msd [place] workers [employer] require PA to be completed by pharmacy. Please call [phone]. Fax info to pof../l,mb

## 2021-10-09 ENCOUNTER — Other Ambulatory Visit: Payer: PRIVATE HEALTH INSURANCE

## 2021-10-25 ENCOUNTER — Ambulatory Visit (INDEPENDENT_AMBULATORY_CARE_PROVIDER_SITE_OTHER): Payer: PRIVATE HEALTH INSURANCE

## 2021-10-25 ENCOUNTER — Other Ambulatory Visit: Payer: Self-pay | Admitting: Orthopedic Surgery

## 2021-10-25 DIAGNOSIS — M25579 Pain in unspecified ankle and joints of unspecified foot: Secondary | ICD-10-CM

## 2021-10-25 DIAGNOSIS — M7662 Achilles tendinitis, left leg: Secondary | ICD-10-CM

## 2021-10-25 NOTE — Progress Notes (Signed)
Patient presents today to pick up custom molded foot orthotics, diagnosed with achilles tendinitis by Dr. Charlsie Merles.   Orthotics were dispensed and fit was satisfactory. Reviewed instructions for break-in and wear. Written instructions given to patient.  Patient will follow up as needed.   [facility] - order # [id number]

## 2021-10-29 ENCOUNTER — Other Ambulatory Visit: Payer: Self-pay | Admitting: Internal Medicine

## 2021-10-29 DIAGNOSIS — I10 Essential (primary) hypertension: Secondary | ICD-10-CM

## 2021-10-31 ENCOUNTER — Ambulatory Visit
Admission: RE | Admit: 2021-10-31 | Discharge: 2021-10-31 | Disposition: A | Discharge status: Home / Self Care | Payer: Worker's Compensation | Source: Ambulatory Visit | Attending: Orthopedic Surgery | Admitting: Orthopedic Surgery

## 2021-10-31 DIAGNOSIS — M25579 Pain in unspecified ankle and joints of unspecified foot: Secondary | ICD-10-CM

## 2021-11-12 ENCOUNTER — Other Ambulatory Visit: Payer: Self-pay | Admitting: Internal Medicine

## 2021-11-12 DIAGNOSIS — K5901 Slow transit constipation: Secondary | ICD-10-CM

## 2021-11-19 ENCOUNTER — Encounter (HOSPITAL_COMMUNITY): Payer: Self-pay | Admitting: Orthopedic Surgery

## 2021-11-19 ENCOUNTER — Other Ambulatory Visit: Payer: Self-pay

## 2021-11-19 NOTE — Progress Notes (Signed)
PCP - Dr. Sanda Linger  EKG - DOS  Fasting Blood Sugar - 100-102 Checks Blood Sugar "once in awhile"  ERAS Protcol - Clears until 0500   Anesthesia review: N  Patient verbally denies any shortness of breath, fever, cough and chest pain during phone call   -------------  SDW INSTRUCTIONS given:  Your procedure is scheduled on 11/20/21.  Report to [facility] Main Entrance "A" at 0530 A.M., and check in at the Admitting office.  Call this number if you have problems the morning of surgery:  [phone]   Remember:  Do not eat after midnight the night before your surgery  You may drink clear liquids until 0500 the morning of your surgery.   Clear liquids allowed are: Water, Non-Citrus Juices (without pulp), Carbonated Beverages, Clear Tea, Black Coffee Only, and Gatorade    Take these medicines the morning of surgery with A SIP OF WATER  sodium chloride (OCEAN)-if needed acetaminophen (TYLENOL)-if needed fexofenadine (ALLEGRA)-if needed tiZANidine (ZANAFLEX)-if needed Polyethyl Glycol-Propyl Glycol (SYSTANE ULTRA)-if needed  As of today, STOP taking any Aspirin (unless otherwise instructed by your surgeon) Aleve, Naproxen, Ibuprofen, Motrin, Advil, Goody's, BC's, all herbal medications, fish oil, and all vitamins.                      Do not wear jewelry, make up, or nail polish            Do not wear lotions, powders, perfumes/colognes, or deodorant.            Do not shave 48 hours prior to surgery.  Men may shave face and neck.            Do not bring valuables to the hospital.            [facility] is not responsible for any belongings or valuables.  Do NOT Smoke (Tobacco/Vaping) 24 hours prior to your procedure If you use a CPAP at night, you may bring all equipment for your overnight stay.   Contacts, glasses, dentures or bridgework may not be worn into surgery.      For patients admitted to the hospital, discharge time will be determined by your treatment team.    Patients discharged the day of surgery will not be allowed to drive home, and someone needs to stay with them for 24 hours.    Special instructions:   [place]- Preparing For Surgery  Before surgery, you can play an important role. Because skin is not sterile, your skin needs to be as free of germs as possible. You can reduce the number of germs on your skin by washing with CHG (chlorahexidine gluconate) Soap before surgery.  CHG is an antiseptic cleaner which kills germs and bonds with the skin to continue killing germs even after washing.    Oral Hygiene is also important to reduce your risk of infection.  Remember - BRUSH YOUR TEETH THE MORNING OF SURGERY WITH YOUR REGULAR TOOTHPASTE  Please do not use if you have an allergy to CHG or antibacterial soaps. If your skin becomes reddened/irritated stop using the CHG.  Do not shave (including legs and underarms) for at least 48 hours prior to first CHG shower. It is OK to shave your face.  Please follow these instructions carefully.   Shower the NIGHT BEFORE SURGERY and the MORNING OF SURGERY with DIAL Soap.   Pat yourself dry with a CLEAN TOWEL.  Wear CLEAN PAJAMAS to bed the night before surgery  Place CLEAN SHEETS on your bed the night of your first shower and DO NOT SLEEP WITH PETS.   Day of Surgery: Please shower morning of surgery  Wear Clean/Comfortable clothing the morning of surgery Do not apply any deodorants/lotions.   Remember to brush your teeth WITH YOUR REGULAR TOOTHPASTE.   Questions were answered. Patient verbalized understanding of instructions.

## 2021-11-19 NOTE — Anesthesia Preprocedure Evaluation (Addendum)
Anesthesia Evaluation  Patient identified by MRN, date of birth, ID band Patient awake    Reviewed: Allergy & Precautions, NPO status , Patient's Chart, lab work & pertinent test results  Airway Mallampati: III  TM Distance: >3 FB Neck ROM: Full    Dental  (+) Teeth Intact, Dental Advisory Given   Pulmonary neg pulmonary ROS   Pulmonary exam normal breath sounds clear to auscultation       Cardiovascular hypertension, Normal cardiovascular exam Rhythm:Regular Rate:Normal     Neuro/Psych negative neurological ROS     GI/Hepatic Neg liver ROS,GERD  Controlled,,  Endo/Other  diabetes, Well Controlled, Type 2  BMI 34 a1c 6.5 FS 160  Renal/GU negative Renal ROS     Musculoskeletal  (+) Arthritis , Osteoarthritis,  HPI: Devin Roberts is an 61 y.o. male  s/p repair of left pilon fracture and syndesmosis March 2023.  Patient has been followed closely and has done very well really has not had any complaints and was seemingly healing without any issue.  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   Abdominal  (+) + obese  Peds  Hematology negative hematology ROS (+)   Anesthesia Other Findings   Reproductive/Obstetrics                             Anesthesia Physical Anesthesia Plan  ASA: 3  Anesthesia Plan: General   Post-op Pain Management: Tylenol PO (pre-op)*   Induction: Intravenous  PONV Risk Score and Plan: Ondansetron, Midazolam and Treatment may vary due to age or medical condition  Airway Management Planned: LMA  Additional Equipment: None  Intra-op Plan:   Post-operative Plan: Extubation in OR  Informed Consent: I have reviewed the patients History and Physical, chart, labs and discussed the procedure including the risks, benefits and alternatives  for the proposed anesthesia with the patient or authorized representative who has indicated his/her understanding and acceptance.     Dental advisory given  Plan Discussed with: CRNA  Anesthesia Plan Comments:         Anesthesia Quick Evaluation

## 2021-11-19 NOTE — H&P (Signed)
Orthopaedic Trauma Service (OTS) Consult   Patient ID: Devin Roberts MRN: [id number] DOB/AGE: 60-Jan-1962 60 y.o.    HPI: Devin Roberts is an 61 y.o. male  s/p repair of left pilon fracture and syndesmosis March 2023.  Patient has been followed closely and has done very well really has not had any complaints and was seemingly healing without any issue.  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  Patient presents today for removal of hardware including syndesmotic screws and medial distal tibial plate along with intramedullary nailing of his nonunion.  Risks and benefits have been reviewed with the patient and he wishes to proceed  Past Medical History:  Diagnosis Date   Acute asthmatic bronchitis    Allergic rhinitis    Anxiety    Chronic insomnia    COVID-19    Diabetes mellitus    DJD (degenerative joint disease)    GERD (gastroesophageal reflux disease)    Headache(784.0)    Hypercholesterolemia    Hypertension    Obesity     Past Surgical History:  Procedure Laterality Date   ORIF ANKLE FRACTURE Left 05/28/2021   Procedure: OPEN REDUCTION INTERNAL FIXATION (ORIF) TIBIA/FIBULA FRACTURE;  Surgeon: Myrene Galas, MD;  Location: [facility] OR;  Service: Orthopedics;  Laterality: Left;   SHOULDER SURGERY      Family History  Problem Relation Age of Onset   COPD Father    Lung cancer Father    Hyperlipidemia Mother    Cancer Neg Hx    Diabetes Neg Hx    Heart disease Neg Hx    Hypertension Neg Hx    Kidney disease Neg Hx    Stroke Neg Hx     Social History:  reports that he has never smoked. He has never used smokeless tobacco. He reports that he does not drink alcohol and does not use drugs.  Allergies:  Allergies  Allergen Reactions   Codeine Itching   Lisinopril Cough    REACTION: Allergic to ACE inhibitors w/ cough    Simvastatin     REACTION: pt states ZOCOR caused leg cramps    Medications: I have reviewed the patient's current medications. Current Meds  Medication Sig   acetaminophen (TYLENOL) 500 MG tablet Take 500-1,000 mg by mouth every 6 (six) hours as needed for moderate pain.   docusate sodium (COLACE) 100 MG capsule TAKE ONE CAPSULE BY MOUTH TWICE DAILY   EDARBYCLOR 40-25 MG TABS Take 1 tablet by mouth daily.   fexofenadine (ALLEGRA) 180 MG tablet Take 180 mg by mouth daily as needed for allergies or rhinitis.   icosapent Ethyl (VASCEPA) 1 g capsule TAKE (2) CAPSULES BY MOUTH TWICE DAILY. (Patient taking differently: Take 1 g by mouth every 7 (seven) days.)   Multiple Vitamin (MULTIVITAMIN WITH MINERALS) TABS tablet Take 1 tablet by mouth daily.   Polyethyl Glycol-Propyl Glycol (SYSTANE ULTRA) 0.4-0.3 % SOLN Place 1 drop into both eyes daily as needed (dry eyes).   pravastatin (PRAVACHOL) 40 MG tablet TAKE ONE TABLET BY MOUTH AT BEDTIME   sodium chloride (OCEAN) 0.65 % SOLN nasal spray Place 1 spray into both nostrils as needed for congestion.   tiZANidine (ZANAFLEX) 2 MG tablet Take 2 mg by mouth every 8 (eight) hours as needed for muscle spasms.   zolpidem (AMBIEN) 10 MG tablet Take 1 tablet (10 mg  total) by mouth at bedtime.     No results found for this or any previous visit (from the past 48 hour(s)).  No results found.  Intake/Output    None      Review of Systems  Constitutional:  Negative for chills and fever.  Respiratory:  Negative for shortness of breath and wheezing.   Cardiovascular:  Negative for chest pain and palpitations.  Gastrointestinal:  Negative for abdominal pain, nausea and vomiting.  Neurological:  Negative for tingling and sensory change.   Height 5\' 8"  (1.727 m), weight 101.2 kg. Physical Exam Vitals reviewed.  Constitutional:      General: He is not in acute distress.    Appearance: Normal appearance. He is well-developed and well-groomed.  HENT:      Head: Normocephalic and atraumatic.  Cardiovascular:     Rate and Rhythm: Normal rate and regular rhythm.     Heart sounds: S1 normal and S2 normal.  Pulmonary:     Effort: Pulmonary effort is normal.  Musculoskeletal:     Comments: Left lower extremity Surgical wounds left ankle well-healed Still really does not have any tenderness over his distal tibia No fibular tenderness Ankle range of motion is about 10 degrees of extension and 15 degrees of flexion Full inversion and eversion DPN, SPN, TN sensory functions are intact EHL, FHL, lesser toe motor function intact Into tibialis, posterior tibialis, peroneals and gastrocsoleus complex motor functions intact No knee tenderness + DP pulse Compartments are soft Calf atrophy noted compared to the contralateral side as sequela from his injury No DCT  No asymmetric swelling, no pitting edema   Skin:    General: Skin is warm.     Capillary Refill: Capillary refill takes less than 2 seconds.  Neurological:     General: No focal deficit present.     Mental Status: He is alert and oriented to person, place, and time.     Comments: Slightly antalgic gait  Psychiatric:        Attention and Perception: Attention and perception normal.        Mood and Affect: Mood and affect normal.        Speech: Speech normal.        Behavior: Behavior is cooperative.    Assessment/Plan:  61 year old male work-related injury with left distal tibia fracture with syndesmotic disruption with hypertrophic nonunion left distal tibia  -Left distal tibia nonunion  OR for repair of nonunion which will include removal of hardware from his tibia as well as his syndesmotic fixation.  We anticipate addressing his nonunion with intramedullary nailing  Will likely allow him to be partial weightbearing postoperatively in a boot or some protected fashion  Risks and benefits reviewed the patient wished to proceed  Anticipate outpatient surgery   Fracture was not  open however we will check sed rate and CRP.  Hold antibiotics until instructed to give in the OR  - Pain management:  Multimodal   - DVT/PE prophylaxis:  Will likely do aspirin at discharge - ID:   Hold preoperative antibiotics.  Anticipate cultures and give antibiotics in the OR  - Metabolic Bone Disease:  Check vitamin D labs  - Impediments to fracture healing:  Established nonunion - Dispo:  OR for removal of hardware and intramedullary nailing of left tibia nonunion    47, PA-C [phone] (C) 11/19/2021, 1:19 PM  [facility] [address] [place] [phone] (F)    After  5pm and on the weekends please log on to Amion, go to orthopaedics and the look under the [facility] Call for the provider(s) on call. You can also call our office at [phone] and then follow the prompts to be connected to the call team.

## 2021-11-20 ENCOUNTER — Encounter (HOSPITAL_COMMUNITY)
Admission: RE | Disposition: A | Discharge status: Home / Self Care | Payer: Self-pay | Source: Home / Self Care | Attending: Orthopedic Surgery

## 2021-11-20 ENCOUNTER — Ambulatory Visit (HOSPITAL_COMMUNITY): Payer: Worker's Compensation | Admitting: Anesthesiology

## 2021-11-20 ENCOUNTER — Encounter (HOSPITAL_COMMUNITY): Payer: Self-pay | Admitting: Orthopedic Surgery

## 2021-11-20 ENCOUNTER — Ambulatory Visit (HOSPITAL_BASED_OUTPATIENT_CLINIC_OR_DEPARTMENT_OTHER): Payer: Worker's Compensation | Admitting: Anesthesiology

## 2021-11-20 ENCOUNTER — Other Ambulatory Visit (HOSPITAL_COMMUNITY): Payer: Self-pay

## 2021-11-20 ENCOUNTER — Ambulatory Visit (HOSPITAL_COMMUNITY): Payer: PRIVATE HEALTH INSURANCE

## 2021-11-20 ENCOUNTER — Ambulatory Visit (HOSPITAL_COMMUNITY)
Admission: RE | Admit: 2021-11-20 | Discharge: 2021-11-20 | Disposition: A | Discharge status: Home / Self Care | Payer: Worker's Compensation | Attending: Orthopedic Surgery | Admitting: Orthopedic Surgery

## 2021-11-20 ENCOUNTER — Other Ambulatory Visit: Payer: Self-pay

## 2021-11-20 DIAGNOSIS — X58XXXA Exposure to other specified factors, initial encounter: Secondary | ICD-10-CM | POA: Insufficient documentation

## 2021-11-20 DIAGNOSIS — S82302K Unspecified fracture of lower end of left tibia, subsequent encounter for closed fracture with nonunion: Secondary | ICD-10-CM

## 2021-11-20 DIAGNOSIS — S82872A Displaced pilon fracture of left tibia, initial encounter for closed fracture: Secondary | ICD-10-CM | POA: Insufficient documentation

## 2021-11-20 DIAGNOSIS — E119 Type 2 diabetes mellitus without complications: Secondary | ICD-10-CM

## 2021-11-20 DIAGNOSIS — M1990 Unspecified osteoarthritis, unspecified site: Secondary | ICD-10-CM | POA: Diagnosis not present

## 2021-11-20 DIAGNOSIS — I10 Essential (primary) hypertension: Secondary | ICD-10-CM | POA: Diagnosis not present

## 2021-11-20 DIAGNOSIS — Y990 Civilian activity done for income or pay: Secondary | ICD-10-CM | POA: Diagnosis not present

## 2021-11-20 DIAGNOSIS — S8782XA Crushing injury of left lower leg, initial encounter: Secondary | ICD-10-CM | POA: Diagnosis not present

## 2021-11-20 DIAGNOSIS — S93432A Sprain of tibiofibular ligament of left ankle, initial encounter: Secondary | ICD-10-CM | POA: Diagnosis not present

## 2021-11-20 DIAGNOSIS — S82872K Displaced pilon fracture of left tibia, subsequent encounter for closed fracture with nonunion: Secondary | ICD-10-CM

## 2021-11-20 HISTORY — DX: COVID-19: U07.1

## 2021-11-20 HISTORY — PX: TIBIA IM NAIL INSERTION: SHX2516

## 2021-11-20 HISTORY — PX: HARDWARE REMOVAL: SHX979

## 2021-11-20 LAB — CBC WITH DIFFERENTIAL/PLATELET
Abs Immature Granulocytes: 0.02 10*3/uL (ref 0.00–0.07)
Basophils Absolute: 0.1 10*3/uL (ref 0.0–0.1)
Basophils Relative: 1 %
Eosinophils Absolute: 0.1 10*3/uL (ref 0.0–0.5)
Eosinophils Relative: 2 %
HCT: 45.1 % (ref 39.0–52.0)
Hemoglobin: 15.7 g/dL (ref 13.0–17.0)
Immature Granulocytes: 0 %
Lymphocytes Relative: 32 %
Lymphs Abs: 2.1 10*3/uL (ref 0.7–4.0)
MCH: 32.6 pg (ref 26.0–34.0)
MCHC: 34.8 g/dL (ref 30.0–36.0)
MCV: 93.6 fL (ref 80.0–100.0)
Monocytes Absolute: 0.7 10*3/uL (ref 0.1–1.0)
Monocytes Relative: 11 %
Neutro Abs: 3.5 10*3/uL (ref 1.7–7.7)
Neutrophils Relative %: 54 %
Platelets: 224 10*3/uL (ref 150–400)
RBC: 4.82 MIL/uL (ref 4.22–5.81)
RDW: 12.1 % (ref 11.5–15.5)
WBC: 6.5 10*3/uL (ref 4.0–10.5)
nRBC: 0 % (ref 0.0–0.2)

## 2021-11-20 LAB — COMPREHENSIVE METABOLIC PANEL
ALT: 27 U/L (ref 0–44)
AST: 27 U/L (ref 15–41)
Albumin: 4.3 g/dL (ref 3.5–5.0)
Alkaline Phosphatase: 72 U/L (ref 38–126)
Anion gap: 9 (ref 5–15)
BUN: 17 mg/dL (ref 6–20)
CO2: 26 mmol/L (ref 22–32)
Calcium: 9.1 mg/dL (ref 8.9–10.3)
Chloride: 102 mmol/L (ref 98–111)
Creatinine, Ser: 1.33 mg/dL — ABNORMAL HIGH (ref 0.61–1.24)
GFR, Estimated: >60 mL/min (ref >60)
Glucose, Bld: 137 mg/dL — ABNORMAL HIGH (ref 70–99)
Potassium: 3.6 mmol/L (ref 3.5–5.1)
Sodium: 137 mmol/L (ref 135–145)
Total Bilirubin: 0.5 mg/dL (ref 0.3–1.2)
Total Protein: 7.1 g/dL (ref 6.5–8.1)

## 2021-11-20 LAB — RAPID URINE DRUG SCREEN, HOSP PERFORMED
Amphetamines: NOT DETECTED
Barbiturates: NOT DETECTED
Benzodiazepines: NOT DETECTED
Cocaine: NOT DETECTED
Opiates: NOT DETECTED
Tetrahydrocannabinol: NOT DETECTED

## 2021-11-20 LAB — GLUCOSE, CAPILLARY
Glucose-Capillary: 160 mg/dL — ABNORMAL HIGH (ref 70–99)
Glucose-Capillary: 142 mg/dL — ABNORMAL HIGH (ref 70–99)
Glucose-Capillary: 194 mg/dL — ABNORMAL HIGH (ref 70–99)

## 2021-11-20 LAB — C-REACTIVE PROTEIN: CRP: 1 mg/dL — ABNORMAL HIGH (ref <1.0)

## 2021-11-20 LAB — VITAMIN D 25 HYDROXY (VIT D DEFICIENCY, FRACTURES): Vit D, 25-Hydroxy: 42.96 ng/mL (ref 30–100)

## 2021-11-20 LAB — PROTIME-INR
INR: 0.9 (ref 0.8–1.2)
Prothrombin Time: 12.2 seconds (ref 11.4–15.2)

## 2021-11-20 LAB — SEDIMENTATION RATE: Sed Rate: 5 mm/hr (ref 0–16)

## 2021-11-20 SURGERY — INSERTION, INTRAMEDULLARY ROD, TIBIA
Anesthesia: General | Site: Leg Lower | Laterality: Left

## 2021-11-20 MED ORDER — LACTATED RINGERS IV SOLN: INTRAVENOUS | Status: DC | PRN

## 2021-11-20 MED ORDER — AMISULPRIDE (ANTIEMETIC) 5 MG/2ML IV SOLN: 10.0000 mg | Freq: Once | INTRAVENOUS | Status: DC | PRN

## 2021-11-20 MED ORDER — PROPOFOL 10 MG/ML IV BOLUS
INTRAVENOUS | Status: DC | PRN
  Administered 2021-11-20: 200 mg via INTRAVENOUS

## 2021-11-20 MED ORDER — ONDANSETRON 4 MG PO TBDP: 4.0000 mg | ORAL_TABLET | Freq: Three times a day (TID) | ORAL | 0 refills | Status: DC | PRN

## 2021-11-20 MED ORDER — OXYCODONE HCL 5 MG/5ML PO SOLN: 5.0000 mg | Freq: Once | ORAL | Status: AC | PRN

## 2021-11-20 MED ORDER — OXYCODONE HCL 5 MG PO TABS
5.0000 mg | ORAL_TABLET | Freq: Once | ORAL | Status: AC | PRN
  Administered 2021-11-20: 5 mg via ORAL

## 2021-11-20 MED ORDER — MIDAZOLAM HCL 2 MG/2ML IJ SOLN
INTRAMUSCULAR | Status: AC
  Filled 2021-11-20: qty 2

## 2021-11-20 MED ORDER — MIDAZOLAM HCL 2 MG/2ML IJ SOLN
INTRAMUSCULAR | Status: DC | PRN
  Administered 2021-11-20: 2 mg via INTRAVENOUS

## 2021-11-20 MED ORDER — DEXAMETHASONE SODIUM PHOSPHATE 10 MG/ML IJ SOLN
INTRAMUSCULAR | Status: DC | PRN
  Administered 2021-11-20: 10 mg via INTRAVENOUS

## 2021-11-20 MED ORDER — ORAL CARE MOUTH RINSE: 15.0000 mL | Freq: Once | OROMUCOSAL | Status: AC

## 2021-11-20 MED ORDER — HYDROMORPHONE HCL 1 MG/ML IJ SOLN
0.2500 mg | INTRAMUSCULAR | Status: DC | PRN
  Administered 2021-11-20 (×4): 0.5 mg via INTRAVENOUS

## 2021-11-20 MED ORDER — FENTANYL CITRATE (PF) 250 MCG/5ML IJ SOLN
INTRAMUSCULAR | Status: AC
  Filled 2021-11-20: qty 5

## 2021-11-20 MED ORDER — CEFAZOLIN SODIUM 1 G IJ SOLR
INTRAMUSCULAR | Status: AC
Start: 2021-11-20 — End: ?
  Filled 2021-11-20: qty 20

## 2021-11-20 MED ORDER — ACETAMINOPHEN 325 MG PO TABS: 325.0000 mg | ORAL_TABLET | Freq: Four times a day (QID) | ORAL | 0 refills | Status: DC | PRN

## 2021-11-20 MED ORDER — ONDANSETRON HCL 4 MG/2ML IJ SOLN: 4.0000 mg | Freq: Once | INTRAMUSCULAR | Status: DC | PRN

## 2021-11-20 MED ORDER — HYDROMORPHONE HCL 1 MG/ML IJ SOLN
INTRAMUSCULAR | Status: AC
  Filled 2021-11-20: qty 1

## 2021-11-20 MED ORDER — CHLORHEXIDINE GLUCONATE 0.12 % MT SOLN
15.0000 mL | Freq: Once | OROMUCOSAL | Status: AC
  Administered 2021-11-20: 15 mL via OROMUCOSAL
  Filled 2021-11-20: qty 15

## 2021-11-20 MED ORDER — OXYCODONE-ACETAMINOPHEN 5-325 MG PO TABS: 1.0000 | ORAL_TABLET | Freq: Four times a day (QID) | ORAL | 0 refills | Status: DC | PRN

## 2021-11-20 MED ORDER — OXYCODONE-ACETAMINOPHEN 5-325 MG PO TABS
1.0000 | ORAL_TABLET | Freq: Four times a day (QID) | ORAL | 0 refills | Status: DC | PRN
  Filled 2021-11-20: qty 50, 7d supply, fill #0

## 2021-11-20 MED ORDER — GABAPENTIN 300 MG PO CAPS
300.0000 mg | ORAL_CAPSULE | Freq: Once | ORAL | Status: AC
  Administered 2021-11-20: 300 mg via ORAL
  Filled 2021-11-20: qty 1

## 2021-11-20 MED ORDER — PHENYLEPHRINE 80 MCG/ML (10ML) SYRINGE FOR IV PUSH (FOR BLOOD PRESSURE SUPPORT)
PREFILLED_SYRINGE | INTRAVENOUS | Status: DC | PRN
  Administered 2021-11-20: 160 ug via INTRAVENOUS
  Administered 2021-11-20: 80 ug via INTRAVENOUS
  Administered 2021-11-20: 160 ug via INTRAVENOUS

## 2021-11-20 MED ORDER — ACETAMINOPHEN 325 MG PO TABS
325.0000 mg | ORAL_TABLET | Freq: Four times a day (QID) | ORAL | 0 refills | Status: DC | PRN
  Filled 2021-11-20: qty 30, 4d supply, fill #0

## 2021-11-20 MED ORDER — FENTANYL CITRATE (PF) 250 MCG/5ML IJ SOLN
INTRAMUSCULAR | Status: DC | PRN
  Administered 2021-11-20: 50 ug via INTRAVENOUS
  Administered 2021-11-20: 100 ug via INTRAVENOUS
  Administered 2021-11-20 (×4): 50 ug via INTRAVENOUS

## 2021-11-20 MED ORDER — ACETAMINOPHEN 500 MG PO TABS: 500.0000 mg | ORAL_TABLET | Freq: Four times a day (QID) | ORAL | 0 refills | Status: AC | PRN

## 2021-11-20 MED ORDER — 0.9 % SODIUM CHLORIDE (POUR BTL) OPTIME
TOPICAL | Status: DC | PRN
  Administered 2021-11-20: 1000 mL

## 2021-11-20 MED ORDER — ONDANSETRON 4 MG PO TBDP
4.0000 mg | ORAL_TABLET | Freq: Three times a day (TID) | ORAL | 0 refills | Status: DC | PRN
  Filled 2021-11-20: qty 20, 7d supply, fill #0

## 2021-11-20 MED ORDER — ACETAMINOPHEN 500 MG PO TABS
1000.0000 mg | ORAL_TABLET | Freq: Once | ORAL | Status: DC
  Filled 2021-11-20: qty 2

## 2021-11-20 MED ORDER — INSULIN ASPART 100 UNIT/ML IJ SOLN
0.0000 [IU] | INTRAMUSCULAR | Status: DC | PRN
  Administered 2021-11-20: 2 [IU] via SUBCUTANEOUS
  Filled 2021-11-20: qty 1

## 2021-11-20 MED ORDER — LACTATED RINGERS IV SOLN: INTRAVENOUS | Status: DC

## 2021-11-20 MED ORDER — OXYCODONE HCL 5 MG PO TABS
ORAL_TABLET | ORAL | Status: AC
  Filled 2021-11-20: qty 1

## 2021-11-20 MED ORDER — ONDANSETRON HCL 4 MG/2ML IJ SOLN
INTRAMUSCULAR | Status: DC | PRN
  Administered 2021-11-20: 4 mg via INTRAVENOUS

## 2021-11-20 MED ORDER — LIDOCAINE 2% (20 MG/ML) 5 ML SYRINGE
INTRAMUSCULAR | Status: DC | PRN
  Administered 2021-11-20: 60 mg via INTRAVENOUS

## 2021-11-20 MED ORDER — ACETAMINOPHEN 500 MG PO TABS
1000.0000 mg | ORAL_TABLET | Freq: Once | ORAL | Status: AC
  Administered 2021-11-20: 1000 mg via ORAL

## 2021-11-20 MED ORDER — CEFAZOLIN SODIUM-DEXTROSE 2-3 GM-%(50ML) IV SOLR
INTRAVENOUS | Status: DC | PRN
  Administered 2021-11-20: 2 g via INTRAVENOUS

## 2021-11-20 SURGICAL SUPPLY — 83 items
ABDOMINAL PAD ABD IMPLANT
BAG COUNTER SPONGE SURGICOUNT (BAG) ×2 IMPLANT
BANDAGE ACE 6X5 VEL STRL LF (GAUZE/BANDAGES/DRESSINGS) IMPLANT
BANDAGE ESMARK 6X9 LF (GAUZE/BANDAGES/DRESSINGS) ×2 IMPLANT
BIT DRILL LONG 4.2 (BIT) IMPLANT
BIT DRILL SHORT 4.2 (BIT) IMPLANT
BLADE SURG 10 STRL SS (BLADE) ×2 IMPLANT
BNDG COHESIVE 6X5 TAN STRL LF (GAUZE/BANDAGES/DRESSINGS) ×2 IMPLANT
BNDG ELASTIC 4X5.8 VLCR STR LF (GAUZE/BANDAGES/DRESSINGS) ×2 IMPLANT
BNDG ELASTIC 6X5.8 VLCR STR LF (GAUZE/BANDAGES/DRESSINGS) ×2 IMPLANT
BNDG ESMARK 6X9 LF (GAUZE/BANDAGES/DRESSINGS) ×2
BNDG GAUZE DERMACEA FLUFF 4 (GAUZE/BANDAGES/DRESSINGS) ×4 IMPLANT
BRUSH SCRUB EZ PLAIN DRY (MISCELLANEOUS) ×4 IMPLANT
CAST PADDING 2X4YD NS (MISCELLANEOUS) IMPLANT
COVER SURGICAL LIGHT HANDLE (MISCELLANEOUS) ×4 IMPLANT
CUFF TOURN SGL QUICK 18X4 (TOURNIQUET CUFF) IMPLANT
CUFF TOURN SGL QUICK 24 (TOURNIQUET CUFF)
CUFF TOURN SGL QUICK 34 (TOURNIQUET CUFF)
CUFF TRNQT CYL 24X4X16.5-23 (TOURNIQUET CUFF) IMPLANT
CUFF TRNQT CYL 34X4.125X (TOURNIQUET CUFF) IMPLANT
DRAPE C-ARM 42X72 X-RAY (DRAPES) ×2 IMPLANT
DRAPE C-ARMOR (DRAPES) ×2 IMPLANT
DRAPE HALF SHEET 40X57 (DRAPES) IMPLANT
DRAPE INCISE IOBAN 66X45 STRL (DRAPES) IMPLANT
DRAPE U-SHAPE 47X51 STRL (DRAPES) ×2 IMPLANT
DRILL BIT SHORT 4.2 (BIT) ×2
DRSG ADAPTIC 3X8 NADH LF (GAUZE/BANDAGES/DRESSINGS) ×2 IMPLANT
DRSG MEPITEL 4X7.2 (GAUZE/BANDAGES/DRESSINGS) ×2 IMPLANT
DRSG PAD ABDOMINAL 8X10 ST (GAUZE/BANDAGES/DRESSINGS) ×4 IMPLANT
ELECT REM PT RETURN 9FT ADLT (ELECTROSURGICAL) ×2
ELECTRODE REM PT RTRN 9FT ADLT (ELECTROSURGICAL) ×2 IMPLANT
GAUZE SPONGE 4X4 12PLY STRL (GAUZE/BANDAGES/DRESSINGS) ×2 IMPLANT
GLOVE BIO SURGEON STRL SZ7.5 (GLOVE) ×2 IMPLANT
GLOVE BIO SURGEON STRL SZ8 (GLOVE) ×2 IMPLANT
GLOVE BIO SURGEON STRL SZ8.5 (GLOVE) ×2 IMPLANT
GLOVE BIOGEL PI IND STRL 7.5 (GLOVE) ×2 IMPLANT
GLOVE BIOGEL PI IND STRL 8 (GLOVE) ×2 IMPLANT
GLOVE BIOGEL PI INDICATOR 7.5 (GLOVE) ×2
GLOVE BIOGEL PI INDICATOR 8 (GLOVE) ×2
GLOVE SURG ORTHO LTX SZ7.5 (GLOVE) ×4 IMPLANT
GOWN STRL REUS W/ TWL LRG LVL3 (GOWN DISPOSABLE) ×4 IMPLANT
GOWN STRL REUS W/ TWL XL LVL3 (GOWN DISPOSABLE) ×2 IMPLANT
GOWN STRL REUS W/TWL LRG LVL3 (GOWN DISPOSABLE) ×4
GOWN STRL REUS W/TWL XL LVL3 (GOWN DISPOSABLE) ×2
KIT BASIN OR (CUSTOM PROCEDURE TRAY) ×2 IMPLANT
KIT TURNOVER KIT B (KITS) ×2 IMPLANT
MANIFOLD NEPTUNE II (INSTRUMENTS) ×2 IMPLANT
NAIL TIB TFNA STRL 9X360 (Nail) IMPLANT
NEEDLE 22X1 1/2 (OR ONLY) (NEEDLE) IMPLANT
NS IRRIG 1000ML POUR BTL (IV SOLUTION) ×2 IMPLANT
PACK ORTHO EXTREMITY (CUSTOM PROCEDURE TRAY) ×2 IMPLANT
PAD ARMBOARD 7.5X6 YLW CONV (MISCELLANEOUS) ×4 IMPLANT
PAD CAST 4YDX4 CTTN HI CHSV (CAST SUPPLIES) ×2 IMPLANT
PAD CAST CTTN 3X4 STRL (SOFTGOODS) IMPLANT
PADDING CAST COTTON 3X4 STRL (SOFTGOODS) ×2
PADDING CAST COTTON 4X4 STRL (CAST SUPPLIES) ×2
PADDING CAST COTTON 6X4 STRL (CAST SUPPLIES) ×6 IMPLANT
REAMER ROD 3.8 BALL TIP 3X950 (ORTHOPEDIC DISPOSABLE SUPPLIES) IMPLANT
SCREW LOCK IM NAIL 5X54 (Screw) IMPLANT
SCREW LOCK IM TI 5X42 (Screw) IMPLANT
SCREW LOCK LP 5X56 (Screw) IMPLANT
SCREW LOCK LP MED NAIL 5X50 (Screw) IMPLANT
SPONGE T-LAP 18X18 ~~LOC~~+RFID (SPONGE) ×2 IMPLANT
STAPLER VISISTAT 35W (STAPLE) ×2 IMPLANT
STOCKINETTE IMPERVIOUS LG (DRAPES) ×2 IMPLANT
STRIP CLOSURE SKIN 1/2X4 (GAUZE/BANDAGES/DRESSINGS) IMPLANT
SUCTION FRAZIER HANDLE 10FR (MISCELLANEOUS)
SUCTION TUBE FRAZIER 10FR DISP (MISCELLANEOUS) IMPLANT
SUT ETHILON 2 0 FS 18 (SUTURE) ×4 IMPLANT
SUT PDS AB 2-0 CT1 27 (SUTURE) IMPLANT
SUT PLAIN 3 0 PS2 27 (SUTURE) IMPLANT
SUT VIC AB 0 CT1 27 (SUTURE)
SUT VIC AB 0 CT1 27XBRD ANBCTR (SUTURE) IMPLANT
SUT VIC AB 1 CT1 36 (SUTURE) IMPLANT
SUT VIC AB 2-0 CT1 27 (SUTURE) ×2
SUT VIC AB 2-0 CT1 TAPERPNT 27 (SUTURE) ×2 IMPLANT
SYR CONTROL 10ML LL (SYRINGE) IMPLANT
TOWEL GREEN STERILE (TOWEL DISPOSABLE) ×4 IMPLANT
TOWEL GREEN STERILE FF (TOWEL DISPOSABLE) ×4 IMPLANT
TUBE CONNECTING 12X1/4 (SUCTIONS) ×2 IMPLANT
UNDERPAD 30X36 HEAVY ABSORB (UNDERPADS AND DIAPERS) ×2 IMPLANT
WATER STERILE IRR 1000ML POUR (IV SOLUTION) ×4 IMPLANT
YANKAUER SUCT BULB TIP NO VENT (SUCTIONS) ×2 IMPLANT

## 2021-11-20 NOTE — Discharge Instructions (Addendum)
Orthopaedic Trauma Service Discharge Instructions   General Discharge Instructions  Orthopaedic Injuries:  Left distal tibia nonunion treated with removal of hardware and intramedullary nailing  WEIGHT BEARING STATUS: Weight-bear as tolerated left leg in cam boot  RANGE OF MOTION/ACTIVITY: Unrestricted range of motion left ankle.  Increase activity slowly  Bone health:  Review the following resource for additional information regarding bone health  [email or web address]  Wound Care: Daily wound care starting on 11/22/2021.  Please see below  Discharge Wound Care Instructions  Do NOT apply any ointments, solutions or lotions to pin sites or surgical wounds.  These prevent needed drainage and even though solutions like hydrogen peroxide kill bacteria, they also damage cells lining the pin sites that help fight infection.  Applying lotions or ointments can keep the wounds moist and can cause them to breakdown and open up as well. This can increase the risk for infection. When in doubt call the office.  Surgical incisions should be dressed daily.  If any drainage is noted, use one layer of adaptic or Mepitel, then gauze, Kerlix, and an ace wrap.  [email or web address]  [email or web address].gl  These dressing supplies should be available at local medical supply stores ([facility], [facility], etc). They are not usually carried at places like [facility], [facility], [facility], etc  Once the incision is completely dry and without drainage, it may be left open to air out.  Showering may begin 36-48 hours later.  Cleaning gently with soap and water.  Traumatic wounds should be dressed daily as well.    One layer of adaptic, gauze, Kerlix,  then ace wrap.  The adaptic can be discontinued once the draining has ceased    If you have a wet to dry dressing: wet the gauze with saline the squeeze as much saline out so the gauze is moist (not soaking wet), place moistened gauze over wound, then place a dry gauze over the moist one, followed by Kerlix wrap, then ace wrap.    DVT/PE prophylaxis: Continue your home regimen of aspirin  Diet: as you were eating previously.  Can use over the counter stool softeners and bowel preparations, such as Miralax, to help with bowel movements.  Narcotics can be constipating.  Be sure to drink plenty of fluids  PAIN MEDICATION USE AND EXPECTATIONS  You have likely been given narcotic medications to help control your pain.  After a traumatic event that results in an fracture (broken bone) with or without surgery, it is ok to use narcotic pain medications to help control one's pain.  We understand that everyone responds to pain differently and each individual patient will be evaluated on a regular basis for the continued need for narcotic medications. Ideally, narcotic medication use should last no more than 6-8 weeks (coinciding with fracture healing).   As a patient it is your responsibility as well to monitor narcotic medication use and report the amount and frequency you use these medications when you come to your office visit.   We would also advise that if you are using narcotic medications, you should take a dose prior to therapy to maximize you participation.  IF YOU ARE ON NARCOTIC MEDICATIONS IT IS NOT PERMISSIBLE TO OPERATE A MOTOR VEHICLE (MOTORCYCLE/CAR/TRUCK/MOPED) OR HEAVY MACHINERY DO NOT MIX NARCOTICS WITH OTHER CNS (CENTRAL NERVOUS SYSTEM) DEPRESSANTS SUCH AS ALCOHOL   POST-OPERATIVE OPIOID TAPER INSTRUCTIONS: It is important to wean off of your opioid medication as soon as possible. If you do not need pain medication after your  surgery it is ok to stop day one. Opioids include: Codeine,  Hydrocodone(Norco, Vicodin), Oxycodone(Percocet, oxycontin) and hydromorphone amongst others.  Long term and even short term use of opiods can cause: Increased pain response Dependence Constipation Depression Respiratory depression And more.  Withdrawal symptoms can include Flu like symptoms Nausea, vomiting And more Techniques to manage these symptoms Hydrate well Eat regular healthy meals Stay active Use relaxation techniques(deep breathing, meditating, yoga) Do Not substitute Alcohol to help with tapering If you have been on opioids for less than two weeks and do not have pain than it is ok to stop all together.  Plan to wean off of opioids This plan should start within one week post op of your fracture surgery  Maintain the same interval or time between taking each dose and first decrease the dose.  Cut the total daily intake of opioids by one tablet each day Next start to increase the time between doses. The last dose that should be eliminated is the evening dose.    STOP SMOKING OR USING NICOTINE PRODUCTS!!!!  As discussed nicotine severely impairs your body's ability to heal surgical and traumatic wounds but also impairs bone healing.  Wounds and bone heal by forming microscopic blood vessels (angiogenesis) and nicotine is a vasoconstrictor (essentially, shrinks blood vessels).  Therefore, if vasoconstriction occurs to these microscopic blood vessels they essentially disappear and are unable to deliver necessary nutrients to the healing tissue.  This is one modifiable factor that you can do to dramatically increase your chances of healing your injury.    (This means no smoking, no nicotine gum, patches, etc)  DO NOT USE NONSTEROIDAL ANTI-INFLAMMATORY DRUGS (NSAID'S)  Using products such as Advil (ibuprofen), Aleve (naproxen), Motrin (ibuprofen) for additional pain control during fracture healing can delay and/or prevent the healing response.  If you would like to take over the  counter (OTC) medication, Tylenol (acetaminophen) is ok.  However, some narcotic medications that are given for pain control contain acetaminophen as well. Therefore, you should not exceed more than 4000 mg of tylenol in a day if you do not have liver disease.  Also note that there are may OTC medicines, such as cold medicines and allergy medicines that my contain tylenol as well.  If you have any questions about medications and/or interactions please ask your doctor/PA or your pharmacist.      ICE AND ELEVATE INJURED/OPERATIVE EXTREMITY  Using ice and elevating the injured extremity above your heart can help with swelling and pain control.  Icing in a pulsatile fashion, such as 20 minutes on and 20 minutes off, can be followed.    Do not place ice directly on skin. Make sure there is a barrier between to skin and the ice pack.    Using frozen items such as frozen peas works well as the conform nicely to the are that needs to be iced.  USE AN ACE WRAP OR TED HOSE FOR SWELLING CONTROL  In addition to icing and elevation, Ace wraps or TED hose are used to help limit and resolve swelling.  It is recommended to use Ace wraps or TED hose until you are informed to stop.    When using Ace Wraps start the wrapping distally (farthest away from the body) and wrap proximally (closer to the body)   Example: If you had surgery on your leg or thing and you do not have a splint on, start the ace wrap at the toes and work your way up to the  thigh        If you had surgery on your upper extremity and do not have a splint on, start the ace wrap at your fingers and work your way up to the upper arm  IF YOU ARE IN A SPLINT OR CAST DO NOT REMOVE IT FOR ANY REASON   If your splint gets wet for any reason please contact the office immediately. You may shower in your splint or cast as long as you keep it dry.  This can be done by wrapping in a cast cover or garbage back (or similar)  Do Not stick any thing down your splint  or cast such as pencils, money, or hangers to try and scratch yourself with.  If you feel itchy take benadryl as prescribed on the bottle for itching  IF YOU ARE IN A CAM BOOT (BLACK BOOT)  You may remove boot periodically. Perform daily dressing changes as noted below.  Wash the liner of the boot regularly and wear a sock when wearing the boot. It is recommended that you sleep in the boot until told otherwise    Call office for the following: Temperature greater than 101F Persistent nausea and vomiting Severe uncontrolled pain Redness, tenderness, or signs of infection (pain, swelling, redness, odor or green/yellow discharge around the site) Difficulty breathing, headache or visual disturbances Hives Persistent dizziness or light-headedness Extreme fatigue Any other questions or concerns you may have after discharge  In an emergency, call 911 or go to an Emergency Department at a nearby hospital  HELPFUL INFORMATION  If you had a block, it will wear off between 8-24 hrs postop typically.  This is period when your pain may go from nearly zero to the pain you would have had postop without the block.  This is an abrupt transition but nothing dangerous is happening.  You may take an extra dose of narcotic when this happens.  You should wean off your narcotic medicines as soon as you are able.  Most patients will be off or using minimal narcotics before their first postop appointment.   We suggest you use the pain medication the first night prior to going to bed, in order to ease any pain when the anesthesia wears off. You should avoid taking pain medications on an empty stomach as it will make you nauseous.  Do not drink alcoholic beverages or take illicit drugs when taking pain medications.  In most states it is against the law to drive while you are in a splint or sling.  And certainly against the law to drive while taking narcotics.  You may return to work/school in the next couple of  days when you feel up to it.   Pain medication may make you constipated.  Below are a few solutions to try in this order: Decrease the amount of pain medication if you aren't having pain. Drink lots of decaffeinated fluids. Drink prune juice and/or each dried prunes  If the first 3 don't work start with additional solutions Take Colace - an over-the-counter stool softener Take Senokot - an over-the-counter laxative Take Miralax - a stronger over-the-counter laxative     CALL THE OFFICE WITH ANY QUESTIONS OR CONCERNS: [phone]   VISIT OUR WEBSITE FOR ADDITIONAL INFORMATION: [email or web address]

## 2021-11-20 NOTE — Anesthesia Procedure Notes (Signed)
Procedure Name: LMA Insertion Date/Time: 11/20/2021 8:40 AM  Performed by: Darryl Nestle, CRNAPre-anesthesia Checklist: Patient identified, Emergency Drugs available, Suction available and Patient being monitored Patient Re-evaluated:Patient Re-evaluated prior to induction Oxygen Delivery Method: Circle system utilized Preoxygenation: Pre-oxygenation with 100% oxygen Induction Type: IV induction Ventilation: Mask ventilation without difficulty LMA: LMA inserted LMA Size: 5.0 Tube type: Oral Number of attempts: 1 Placement Confirmation: positive ETCO2 and breath sounds checked- equal and bilateral Tube secured with: Tape Dental Injury: Teeth and Oropharynx as per pre-operative assessment

## 2021-11-20 NOTE — Op Note (Signed)
NAMEEDEL, Devin Roberts MEDICAL RECORD NO: [id number] ACCOUNT NO: [id number] DATE OF BIRTH: [birth year] FACILITY: [facility] LOCATION: [facility] PHYSICIAN: Doralee Albino. Carola Frost, MD  Operative Report   DATE OF PROCEDURE: 11/20/2021  PREOPERATIVE DIAGNOSES: 1.  Left pilon fracture nonunion. 2.  Loose syndesmotic hardware.  POSTOPERATIVE DIAGNOSES: 1.  Left pilon fracture nonunion. 2.  Loose syndesmotic hardware.  PROCEDURE:   1.  Repair of left tibia nonunion with autograft. 2.  Intramedullary nailing of left tibia with Synthes 9 x 360 dynamically locked nail 3.  Removal of deep implant, left tibia. 4.  Removal of deep implant, left fibula. 5.  Manual application of stress under fluoroscopy of the left ankle syndesmosis with confirmed stability.  SURGEON:  Doralee Albino. Carola Frost, MD  ASSISTANT:  Montez Morita, PA-C  ANESTHESIA:  General.  COMPLICATIONS:  None.  TOURNIQUET:  None.  SPECIMENS:  None.  DISPOSITION:  To PACU.  CONDITION:  stable.  BRIEF SUMMARY INDICATION FOR PROCEDURE:  The patient is a 61 year old male who sustained a crush injury to the left leg resulting in a severely displaced pilon fracture treated with ORIF including screw fixation of his disrupted syndesmosis.  The patient  went on to wean from all assistive devices, but continued to have pain and what appeared to be a nonunion in the tibia.  This was confirmed by a CT scan.  I discussed with him the risks and benefits of surgical treatment including the possibility of a  simple open repair of his nonunion autografting alone or intramedullary nail fixation with autografting.  The patient after discussion of the risks and benefits, elected to proceed with the latter, which would include removal of his syndesmotic fixation  and stress evaluation to confirm healing.  Risks discussed included persistent nonunion, need for further surgery, loss of motion, DVT, PE, and multiple others.  He acknowledged these risks and strongly  wished to proceed.  BRIEF SUMMARY OF PROCEDURE:  The patient was given preoperative antibiotics, taken to the operating room where general anesthesia was induced.  His left lower extremity was prepped and draped in the usual sterile fashion.  No tourniquet was used during  the procedure.  We began by bringing in the C-arm and identifying the location of the screw fixation within the plate to minimize our surgical exposure.  A distal medial incision was made and then three smaller stab-like incisions proximally along the  shaft.  The deep implant was cleared of soft tissue with an elevator and the screws withdrawn and then the plate.  A branch of the saphenous which was matted down was sutured with 3-0 Vicryl and divided in order to obtain hemostasis.  Next, attention was  turned to the fibula.  The old incision was remade.  Dissection carried down to the plate. Heads of the screws identified and removed.  Wounds were irrigated and then attention turned to the tibia.  The knee was flexed over a radiolucent triangle.  A 3 cm incision made at the base of the patella.  Medial parapatellar retinacular incision was made. A curved cannulated awl was advanced in the center-center position of the proximal tibia and then the  ball-tipped guidewire down to the nonunion site. I could not pass the ball-tipped guidewire across the nonunion, however, as it was somewhat sclerotic. I then passed the reamer sequentially over the ball-tipped guidewire down to the nonunion site. With  the reamer then adjacent to the ball-tipped guidewire, I used the mallet to advance the wire across the nonunion site  and into the tibial plafond where it was centered within the plafond on 2 views.  The additional control of the wire facilitated this  passage across the nonunion site.  I then went back sequentially and reamed with the smaller reamers across this nonunion site and used the larger reamers to push the autograft within the canal  down to the area that required additional healing.  I then  advanced the ball-tipped still further staying above the subchondral bone and then was able to ream down to this level with a 10 mm reamer.  I did encounter some chatter earlier. The 9 x 360 nail was then passed all the way down to the plafond and two  screws were placed, one medial to lateral and another anteromedial to posterolateral. Proximally a medial to lateral locking bolt was placed securing fixation.  Each screw was checked for position within the nail and length.  The C-arm was then brought in and while my assistant was holding control the proximal tibia, an external rotation stress views performed under live fluoroscopy.  This did not demonstrate any widening of the syndesmosis.  All wounds were irrigated  thoroughly and closed in standard layered fashion.  A sterile, gently compressive dressing was applied from foot to knee.  Montez Morita, PA-C was present and assisting throughout.  Assistant was necessary to control leg and maintain exposure during  hardware removal, during tibial nonunion repair and an intramedullary nailing as well as he assisted with subcutaneous closure.  PROGNOSIS:  The patient will be allowed to begin weightbearing as tolerated with aggressive ice, elevation and range of motion.  We will plan to see him back for removal of sutures in 10-14 days. He will not require any formal DVT prophylaxis given the  immediate mobilization.  He can begin the bone stimulator since his wounds have healed distally.   VAI D: 11/20/2021 11:45:58 am T: 11/20/2021 10:24:00 pm  JOB: [id number]/ [id number]

## 2021-11-20 NOTE — Brief Op Note (Signed)
11/20/2021  11:45 AM  PATIENT:  Devin Roberts  61 y.o. male  [phone]

## 2021-11-20 NOTE — Transfer of Care (Signed)
Immediate Anesthesia Transfer of Care Note  Patient: Devin Roberts  Procedure(s) Performed: INTRAMEDULLARY (IM) NAIL TIBIAL (Left: Leg Lower) HARDWARE REMOVAL (Left: Ankle)  Patient Location: PACU  Anesthesia Type:General  Level of Consciousness: awake, alert  and oriented  Airway & Oxygen Therapy: Patient Spontanous Breathing  Post-op Assessment: Report given to RN and Post -op Vital signs reviewed and stable  Post vital signs: Reviewed and stable  Last Vitals:  Vitals Value Taken Time  BP 161/105 11/20/21 1103  Temp    Pulse 94 11/20/21 1105  Resp 11 11/20/21 1105  SpO2 95 % 11/20/21 1105  Vitals shown include unvalidated device data.  Last Pain:  Vitals:   11/20/21 0607  TempSrc: Oral  PainSc: 0-No pain         Complications: No notable events documented.

## 2021-11-20 NOTE — Progress Notes (Signed)
Orthopedic Tech Progress Note Patient Details:  Devin Roberts Jun 21, 1960 [id number]  PACU RN called requesting a CAM WALKER BOOT   Ortho Devices Type of Ortho Device: CAM walker Ortho Device/Splint Location: LLE Ortho Device/Splint Interventions: Ordered, Application, Adjustment   Post Interventions Patient Tolerated: Well Instructions Provided: Care of device  [name] 11/20/2021, 12:45 PM

## 2021-11-20 NOTE — Anesthesia Postprocedure Evaluation (Signed)
Anesthesia Post Note  Patient: Devin Roberts  Procedure(s) Performed: INTRAMEDULLARY (IM) NAIL TIBIAL (Left: Leg Lower) HARDWARE REMOVAL (Left: Ankle)     Patient location during evaluation: PACU Anesthesia Type: General Level of consciousness: awake and alert, oriented and patient cooperative Pain management: pain level controlled Vital Signs Assessment: post-procedure vital signs reviewed and stable Respiratory status: spontaneous breathing, nonlabored ventilation and respiratory function stable Cardiovascular status: blood pressure returned to baseline and stable Postop Assessment: no apparent nausea or vomiting Anesthetic complications: no   No notable events documented.  Last Vitals:  Vitals:   11/20/21 1115 11/20/21 1130  BP: 127/74 129/75  Pulse: 73 71  Resp: 15 15  Temp:    SpO2: 94% 97%    Last Pain:  Vitals:   11/20/21 1130  TempSrc:   PainSc: Asleep    LLE Motor Response: Purposeful movement;Responds to commands (11/20/21 1130) LLE Sensation: Full sensation (11/20/21 1130)          [name]

## 2021-11-21 ENCOUNTER — Encounter (HOSPITAL_COMMUNITY): Payer: Self-pay | Admitting: Orthopedic Surgery

## 2021-11-23 ENCOUNTER — Other Ambulatory Visit: Payer: Self-pay | Admitting: Internal Medicine

## 2021-11-30 ENCOUNTER — Telehealth: Payer: Self-pay

## 2021-11-30 NOTE — Telephone Encounter (Signed)
Key: [id number]

## 2021-11-30 NOTE — Telephone Encounter (Signed)
VASCEPA CAP 1GM is approved through 12/01/2022.

## 2021-12-04 ENCOUNTER — Other Ambulatory Visit: Payer: Self-pay | Admitting: Internal Medicine

## 2021-12-04 DIAGNOSIS — E118 Type 2 diabetes mellitus with unspecified complications: Secondary | ICD-10-CM

## 2021-12-04 MED ORDER — METFORMIN HCL ER 750 MG PO TB24: 750.0000 mg | ORAL_TABLET | Freq: Every day | ORAL | 0 refills | Status: DC

## 2021-12-05 ENCOUNTER — Telehealth: Payer: Self-pay | Admitting: Gastroenterology

## 2021-12-05 NOTE — Telephone Encounter (Signed)
Received records looks like patient requested Dr. Christella Hartigan. Called patient on 11/15/21 to advise the provider is not available at this time left voicemail. No response called patient again today to follow up left voicemail.

## 2021-12-19 ENCOUNTER — Telehealth: Payer: Self-pay | Admitting: Internal Medicine

## 2021-12-19 NOTE — Telephone Encounter (Signed)
Good Afternoon Dr Hilarie Fredrickson  We have received a request from patient to establish GI care with you. Patient was last seen with [facility] in 2019 for a procedure.  I am sending patient records up for review. Please review and advise on scheduling.  Thank you

## 2021-12-25 ENCOUNTER — Other Ambulatory Visit: Payer: Self-pay | Admitting: Internal Medicine

## 2021-12-25 DIAGNOSIS — I10 Essential (primary) hypertension: Secondary | ICD-10-CM

## 2021-12-31 ENCOUNTER — Other Ambulatory Visit: Payer: Self-pay | Admitting: Internal Medicine

## 2021-12-31 DIAGNOSIS — F5104 Psychophysiologic insomnia: Secondary | ICD-10-CM

## 2022-01-21 ENCOUNTER — Other Ambulatory Visit: Payer: Self-pay | Admitting: Neurological Surgery

## 2022-01-21 DIAGNOSIS — S32028G Other fracture of second lumbar vertebra, subsequent encounter for fracture with delayed healing: Secondary | ICD-10-CM

## 2022-02-07 ENCOUNTER — Other Ambulatory Visit: Payer: Self-pay | Admitting: Internal Medicine

## 2022-02-07 DIAGNOSIS — E785 Hyperlipidemia, unspecified: Secondary | ICD-10-CM

## 2022-02-08 ENCOUNTER — Ambulatory Visit
Admission: RE | Admit: 2022-02-08 | Discharge: 2022-02-08 | Disposition: A | Discharge status: Home / Self Care | Payer: Worker's Compensation | Source: Ambulatory Visit | Attending: Neurological Surgery | Admitting: Neurological Surgery

## 2022-02-08 DIAGNOSIS — S32028G Other fracture of second lumbar vertebra, subsequent encounter for fracture with delayed healing: Secondary | ICD-10-CM

## 2022-02-25 ENCOUNTER — Other Ambulatory Visit: Payer: Self-pay | Admitting: Internal Medicine

## 2022-02-25 DIAGNOSIS — I10 Essential (primary) hypertension: Secondary | ICD-10-CM

## 2022-02-25 DIAGNOSIS — F5104 Psychophysiologic insomnia: Secondary | ICD-10-CM

## 2022-03-14 ENCOUNTER — Other Ambulatory Visit: Payer: Self-pay | Admitting: Internal Medicine

## 2022-03-14 DIAGNOSIS — E118 Type 2 diabetes mellitus with unspecified complications: Secondary | ICD-10-CM

## 2022-03-27 ENCOUNTER — Ambulatory Visit: Payer: Managed Care, Other (non HMO) | Admitting: Internal Medicine

## 2022-03-27 ENCOUNTER — Encounter: Payer: Self-pay | Admitting: Internal Medicine

## 2022-03-27 VITALS — BP 138/86 | HR 68 | Temp 98.1°F | Resp 16 | Ht 68.0 in | Wt 234.0 lb

## 2022-03-27 DIAGNOSIS — I10 Essential (primary) hypertension: Secondary | ICD-10-CM

## 2022-03-27 DIAGNOSIS — E118 Type 2 diabetes mellitus with unspecified complications: Secondary | ICD-10-CM | POA: Diagnosis not present

## 2022-03-27 DIAGNOSIS — Z23 Encounter for immunization: Secondary | ICD-10-CM | POA: Diagnosis not present

## 2022-03-27 LAB — BASIC METABOLIC PANEL
BUN: 15 mg/dL (ref 6–23)
CO2: 30 mEq/L (ref 19–32)
Calcium: 10.1 mg/dL (ref 8.4–10.5)
Chloride: 99 mEq/L (ref 96–112)
Creatinine, Ser: 1.22 mg/dL (ref 0.40–1.50)
GFR: 64.64 mL/min (ref >60.00)
Glucose, Bld: 161 mg/dL — ABNORMAL HIGH (ref 70–99)
Potassium: 4.1 mEq/L (ref 3.5–5.1)
Sodium: 139 mEq/L (ref 135–145)

## 2022-03-27 LAB — HEMOGLOBIN A1C: Hgb A1c MFr Bld: 7.3 % — ABNORMAL HIGH (ref 4.6–6.5)

## 2022-03-27 MED ORDER — TIRZEPATIDE 2.5 MG/0.5ML ~~LOC~~ SOAJ: 2.5000 mg | SUBCUTANEOUS | 0 refills | Status: DC

## 2022-03-27 NOTE — Progress Notes (Signed)
Subjective:  Patient ID: Devin Roberts, male    DOB: Jul 27, 1960  Age: 62 y.o. MRN: [id number]  CC: Hypertension and Diabetes   HPI Devin Roberts presents for f/up -  He walks on a treadmill about 20 minutes a day.  His endurance is good.  He denies chest pain, shortness of breath, diaphoresis, or edema.  Outpatient Medications Prior to Visit  Medication Sig Dispense Refill   acetaminophen (TYLENOL) 500 MG tablet Take 1-2 tablets (500-1,000 mg total) by mouth every 6 (six) hours as needed for moderate pain. 30 tablet 0   calcium carbonate (OSCAL) 1500 (600 Ca) MG TABS tablet Take by mouth 2 (two) times daily with a meal.     docusate sodium (COLACE) 100 MG capsule TAKE ONE CAPSULE BY MOUTH TWICE DAILY 60 capsule 0   EDARBYCLOR 40-25 MG TABS TAKE ONE TABLET BY MOUTH ONCE DAILY. 90 tablet 0   fexofenadine (ALLEGRA) 180 MG tablet Take 180 mg by mouth daily as needed for allergies or rhinitis.     metFORMIN (GLUCOPHAGE-XR) 750 MG 24 hr tablet Take 1 tablet (750 mg total) by mouth daily with breakfast. 90 tablet 0   methocarbamol (ROBAXIN) 750 MG tablet Take 750 mg by mouth 4 (four) times daily.     PARoxetine (PAXIL) 20 MG tablet Take 1 tablet (20 mg total) by mouth at bedtime. 30 tablet 0   pravastatin (PRAVACHOL) 40 MG tablet TAKE ONE TABLET BY MOUTH AT BEDTIME 90 tablet 1   tiZANidine (ZANAFLEX) 2 MG tablet Take 2 mg by mouth every 8 (eight) hours as needed for muscle spasms.     VASCEPA 1 g capsule TAKE TWO CAPSULES BY MOUTH TWICE DAILY 360 capsule 1   zolpidem (AMBIEN) 10 MG tablet Take 1 tablet (10 mg total) by mouth at bedtime. 90 tablet 0   acetaminophen (TYLENOL) 325 MG tablet Take 1-2 tablets (325-650 mg total) by mouth every 6 (six) hours as needed for mild pain or moderate pain. 60 tablet 0   Ascorbic Acid (VITAMIN C) 1000 MG tablet Take 1 tablet (1,000 mg total) by mouth daily. 100 tablet 0   aspirin EC 81 MG tablet Take 1 tablet (81 mg total) by mouth daily. 90 tablet 3    Cholecalciferol 50 MCG (2000 UT) TABS Take 2 tablets (4,000 Units total) by mouth daily. 180 tablet 1   clonazepam (KLONOPIN) 0.125 MG disintegrating tablet Take 1 tablet (0.125 mg total) by mouth 2 (two) times daily as needed for seizure. 60 tablet 0   Multiple Vitamin (MULTIVITAMIN WITH MINERALS) TABS tablet Take 1 tablet by mouth daily.     ondansetron (ZOFRAN-ODT) 4 MG disintegrating tablet Take 1 tablet (4 mg total) by mouth every 8 (eight) hours as needed. 20 tablet 0   oxyCODONE-acetaminophen (PERCOCET/ROXICET) 5-325 MG tablet Take 1-2 tablets by mouth every 6 (six) hours as needed for severe pain or moderate pain. 50 tablet 0   Polyethyl Glycol-Propyl Glycol (SYSTANE ULTRA) 0.4-0.3 % SOLN Place 1 drop into both eyes daily as needed (dry eyes).     sodium chloride (OCEAN) 0.65 % SOLN nasal spray Place 1 spray into both nostrils as needed for congestion.     No facility-administered medications prior to visit.    ROS Review of Systems  Constitutional:  Positive for unexpected weight change (wt gain). Negative for chills, diaphoresis and fatigue.  HENT: Negative.    Eyes: Negative.   Respiratory:  Negative for cough, chest tightness, wheezing and stridor.  Cardiovascular:  Negative for chest pain, palpitations and leg swelling.  Gastrointestinal:  Negative for abdominal pain, constipation, diarrhea, nausea and vomiting.  Endocrine: Negative.   Genitourinary: Negative.  Negative for difficulty urinating and dysuria.  Musculoskeletal:  Positive for arthralgias and back pain. Negative for myalgias.  Skin: Negative.  Negative for color change and pallor.  Neurological: Negative.  Negative for dizziness and weakness.  Hematological:  Negative for adenopathy. Does not bruise/bleed easily.  Psychiatric/Behavioral: Negative.      Objective:  BP 138/86 (BP Location: Right Arm, Patient Position: Sitting, Cuff Size: Large)   Pulse 68   Temp 98.1 F (36.7 C) (Oral)   Resp 16   Ht 5\' 8"   (1.727 m)   Wt 234 lb (106.1 kg)   SpO2 95%   BMI 35.58 kg/m   BP Readings from Last 3 Encounters:  03/27/22 138/86  11/20/21 137/76  08/08/21 132/86    Wt Readings from Last 3 Encounters:  03/27/22 234 lb (106.1 kg)  11/20/21 224 lb (101.6 kg)  08/08/21 221 lb (100.2 kg)    Physical Exam Vitals reviewed.  Constitutional:      Appearance: Normal appearance.  HENT:     Nose: Nose normal.     Mouth/Throat:     Mouth: Mucous membranes are moist.  Eyes:     General: No scleral icterus.    Conjunctiva/sclera: Conjunctivae normal.  Cardiovascular:     Rate and Rhythm: Normal rate and regular rhythm.     Heart sounds: No murmur heard. Pulmonary:     Effort: Pulmonary effort is normal.     Breath sounds: No stridor. No wheezing, rhonchi or rales.  Abdominal:     General: Abdomen is protuberant. Bowel sounds are normal. There is no distension.     Palpations: Abdomen is soft. There is no hepatomegaly, splenomegaly or mass.     Tenderness: There is no abdominal tenderness.  Musculoskeletal:        General: Normal range of motion.     Cervical back: Neck supple.     Right lower leg: No edema.     Left lower leg: No edema.  Lymphadenopathy:     Cervical: No cervical adenopathy.  Skin:    General: Skin is warm and dry.  Neurological:     General: No focal deficit present.     Mental Status: He is alert. Mental status is at baseline.  Psychiatric:        Mood and Affect: Mood normal.        Behavior: Behavior normal.     Lab Results  Component Value Date   WBC 6.5 11/20/2021   HGB 15.7 11/20/2021   HCT 45.1 11/20/2021   PLT 224 11/20/2021   GLUCOSE 161 (H) 03/27/2022   CHOL 118 08/08/2021   TRIG 69.0 08/08/2021   HDL 39.30 08/08/2021   LDLDIRECT 66.0 11/16/2018   LDLCALC 65 08/08/2021   ALT 27 11/20/2021   AST 27 11/20/2021   NA 139 03/27/2022   K 4.1 03/27/2022   CL 99 03/27/2022   CREATININE 1.22 03/27/2022   BUN 15 03/27/2022   CO2 30 03/27/2022   TSH  0.90 08/08/2021   PSA 1.92 08/08/2021   INR 0.9 11/20/2021   HGBA1C 7.3 (H) 03/27/2022   MICROALBUR 1.5 08/08/2021    CT LUMBAR SPINE WO CONTRAST  Result Date: 02/11/2022 CLINICAL DATA:  Fracture follow-up EXAM: CT LUMBAR SPINE WITHOUT CONTRAST TECHNIQUE: Multidetector CT imaging of the lumbar spine was performed without  intravenous contrast administration. Multiplanar CT image reconstructions were also generated. RADIATION DOSE REDUCTION: This exam was performed according to the departmental dose-optimization program which includes automated exposure control, adjustment of the mA and/or kV according to patient size and/or use of iterative reconstruction technique. COMPARISON:  09/06/2021 FINDINGS: Segmentation: 5 lumbar type vertebrae. Alignment: No listhesis Vertebrae: Divided L2 body in the coronal plane by a deep fracture cleft which is healed on the far-lateral edges. No new fracture. No retropulsion Paraspinal and other soft tissues: Negative for perispinal mass or inflammation. Sigmoid diverticulosis and aortic atherosclerosis. Disc levels: L1-2 disc collapse with degenerative and posttraumatic endplate irregularity. Disc height loss causes crowding of the right more than left foramen but the canal and foramina remain patent. Generalized disc space narrowing with endplate spurring greatest at L3-4 and L4-5. IMPRESSION: 1. Healed L2 body fracture with stable alignment since June 2023. 2. L1-2 advanced degenerative and posttraumatic endplate irregularity. Electronically Signed   By: Jorje Guild M.D.   On: 02/11/2022 04:12    Assessment & Plan:   Maximus was seen today for hypertension and diabetes.  Diagnoses and all orders for this visit:  Type II diabetes mellitus with manifestations ([place])- His A1c is up to 7.3%.  Will add a GLP/GIP agonist to metformin. -     Basic metabolic panel; Future -     Hemoglobin A1c; Future -     Hemoglobin A1c -     Basic metabolic panel -      tirzepatide (MOUNJARO) 2.5 MG/0.5ML Pen; Inject 2.5 mg into the skin once a week.  Essential hypertension- His blood pressure is adequately well-controlled.  Electrolytes and renal function are normal. -     Basic metabolic panel; Future -     Basic metabolic panel  Need for vaccination -     Pneumococcal conjugate vaccine 20-valent   I have discontinued Gurshaan L. Frayne's aspirin EC, Cholecalciferol, vitamin C, clonazepam, multivitamin with minerals, Systane Ultra, sodium chloride, oxyCODONE-acetaminophen, and ondansetron. I am also having him start on tirzepatide. Additionally, I am having him maintain his PARoxetine, docusate sodium, fexofenadine, tiZANidine, acetaminophen, Vascepa, pravastatin, zolpidem, Edarbyclor, metFORMIN, calcium carbonate, and methocarbamol.  Meds ordered this encounter  Medications   tirzepatide (MOUNJARO) 2.5 MG/0.5ML Pen    Sig: Inject 2.5 mg into the skin once a week.    Dispense:  2 mL    Refill:  0     Follow-up: Return in about 6 months (around 09/25/2022).  [name], MD

## 2022-03-27 NOTE — Patient Instructions (Signed)

## 2022-04-22 ENCOUNTER — Other Ambulatory Visit: Payer: Self-pay | Admitting: Internal Medicine

## 2022-04-22 DIAGNOSIS — E118 Type 2 diabetes mellitus with unspecified complications: Secondary | ICD-10-CM

## 2022-04-22 MED ORDER — TIRZEPATIDE 5 MG/0.5ML ~~LOC~~ SOAJ: 5.0000 mg | SUBCUTANEOUS | 0 refills | Status: DC

## 2022-05-13 ENCOUNTER — Other Ambulatory Visit: Payer: Self-pay | Admitting: Orthopedic Surgery

## 2022-05-13 ENCOUNTER — Encounter: Payer: Self-pay | Admitting: Orthopedic Surgery

## 2022-05-13 DIAGNOSIS — M25572 Pain in left ankle and joints of left foot: Secondary | ICD-10-CM

## 2022-05-21 ENCOUNTER — Ambulatory Visit
Admission: RE | Admit: 2022-05-21 | Discharge: 2022-05-21 | Disposition: A | Discharge status: Home / Self Care | Payer: Worker's Compensation | Source: Ambulatory Visit | Attending: Orthopedic Surgery | Admitting: Orthopedic Surgery

## 2022-05-21 DIAGNOSIS — M25572 Pain in left ankle and joints of left foot: Secondary | ICD-10-CM

## 2022-06-06 LAB — HM DIABETES EYE EXAM

## 2022-06-11 ENCOUNTER — Encounter: Payer: Self-pay | Admitting: Internal Medicine

## 2022-06-13 ENCOUNTER — Other Ambulatory Visit: Payer: Self-pay | Admitting: Internal Medicine

## 2022-06-13 DIAGNOSIS — E118 Type 2 diabetes mellitus with unspecified complications: Secondary | ICD-10-CM

## 2022-06-13 MED ORDER — TIRZEPATIDE 7.5 MG/0.5ML ~~LOC~~ SOAJ: 7.5000 mg | SUBCUTANEOUS | 0 refills | Status: DC

## 2022-06-17 ENCOUNTER — Other Ambulatory Visit: Payer: Self-pay | Admitting: Internal Medicine

## 2022-06-17 DIAGNOSIS — F5104 Psychophysiologic insomnia: Secondary | ICD-10-CM

## 2022-06-18 ENCOUNTER — Other Ambulatory Visit: Payer: Self-pay | Admitting: Internal Medicine

## 2022-06-18 DIAGNOSIS — E118 Type 2 diabetes mellitus with unspecified complications: Secondary | ICD-10-CM

## 2022-06-20 ENCOUNTER — Other Ambulatory Visit: Payer: Self-pay | Admitting: Internal Medicine

## 2022-06-20 ENCOUNTER — Telehealth: Payer: Self-pay | Admitting: Internal Medicine

## 2022-06-20 DIAGNOSIS — E118 Type 2 diabetes mellitus with unspecified complications: Secondary | ICD-10-CM

## 2022-06-20 MED ORDER — TIRZEPATIDE 5 MG/0.5ML ~~LOC~~ SOAJ: 5.0000 mg | SUBCUTANEOUS | 0 refills | Status: DC

## 2022-06-20 NOTE — Telephone Encounter (Signed)
[facility] called states they need a prescription for the mounjaro 5/0.5 as the 7.5mg /0.58mL is on back order.

## 2022-07-22 ENCOUNTER — Other Ambulatory Visit: Payer: Self-pay | Admitting: Internal Medicine

## 2022-07-22 DIAGNOSIS — I10 Essential (primary) hypertension: Secondary | ICD-10-CM

## 2022-07-30 ENCOUNTER — Other Ambulatory Visit: Payer: Self-pay | Admitting: Internal Medicine

## 2022-07-30 DIAGNOSIS — E785 Hyperlipidemia, unspecified: Secondary | ICD-10-CM

## 2022-08-06 ENCOUNTER — Other Ambulatory Visit: Payer: Self-pay

## 2022-08-06 ENCOUNTER — Encounter (HOSPITAL_COMMUNITY): Payer: Self-pay | Admitting: Orthopedic Surgery

## 2022-08-06 NOTE — Progress Notes (Signed)
S.D.W- Instructions   Your procedure is scheduled on Thurs., Aug 08, 2022 from 8:00AM-9:22AM.  Report to [facility] Main Entrance "A" at 5:30 A.M., then check in with the Admitting office.  Call this number if you have problems the morning of surgery:  [phone]             If you experience any cold or flu symptoms such as cough, fever, chills, shortness of breath, etc. between now and your scheduled surgery, please notify us at the above         number.  Masks are optional throughout our facilities due to the increasing cases of Covid, Flu, and RSV infections.   Remember:  Do not eat after midnight on May 15th  You may drink clear liquids until 3 hours (4:30AM) prior to surgery time, the morning of your surgery.   Clear liquids allowed are: Water, Non-Citrus Juices (without pulp), Carbonated Beverages, Clear Tea, Black Coffee ONLY (NO MILK, CREAM OR POWDERED CREAMER of any kind), and Gatorade    Take these medicines the morning of surgery with A SIP OF WATER:  If Needed: Acetaminophen (TYLENOL)  Fexofenadine (ALLEGRA)  Methocarbamol (ROBAXIN)   As of today, STOP taking any Aspirin (unless otherwise instructed by your surgeon) Aleve, Naproxen, Ibuprofen, Motrin, Advil, Goody's, BC's, all herbal medications, fish oil, and all vitamins.  How to Manage Your Diabetes Before and After Surgery  How do I manage my blood sugar before surgery? Check your blood sugar the morning of your surgery when you wake up and every 2 hours until you get to the Short Stay unit. If your blood sugar is less than 70 mg/dL, you will need to treat for low blood sugar: Do not take insulin. Treat a low blood sugar (less than 70 mg/dL) with  cup of clear juice (cranberry or apple), 4 glucose tablets, OR glucose gel. Recheck blood sugar in 15 minutes after treatment (to make sure it is greater than 70 mg/dL). If your blood sugar is not greater than 70 mg/dL on recheck, call [phone]  for further  instructions. Report your blood sugar to the short stay nurse when you get to Short Stay.  WHAT DO I DO ABOUT MY DIABETES MEDICATION?  Do not take MetFORMIN (GLUCOPHAGE-XR)  the morning of surgery.   Do not take other diabetes injectable Tirzepatide [facility]) for 7 days prior to surgery day.  If your CBG is greater than 220 mg/dL, inform the staff upon arrival to Pre-Op.   Reviewed and Endorsed by [facility] Patient Education Committee, August 2015          Do not wear jewelry. Do not wear lotions, powders, cologne, or deodorant. Do not shave 48 hours prior to surgery.  Men may shave face and neck. Do not bring valuables to the hospital.  [facility] is not responsible for any belongings or valuables.    Do NOT Smoke (Tobacco/Vaping)  24 hours prior to your procedure  If you use a CPAP at night, you may bring your mask for your overnight stay.   Contacts, glasses, hearing aids, dentures or partials may not be worn into surgery, please bring cases for these belongings   For patients admitted to the hospital, discharge time will be determined by your treatment team.   Patients discharged the day of surgery will not be allowed to drive home, and someone needs to stay with them for 24 hours.  Special instructions:    Oral Hygiene is also important to reduce  your risk of infection.  Remember - BRUSH YOUR TEETH THE MORNING OF SURGERY WITH YOUR REGULAR TOOTHPASTE  [place]- Preparing For Surgery  Before surgery, you can play an important role. Because skin is not sterile, your skin needs to be as free of germs as possible. You can reduce the number of germs on your skin by washing with Antibacterial Soap before surgery.     Please follow these instructions carefully.     Shower the NIGHT BEFORE SURGERY and the MORNING OF SURGERY with Antibacterial Soap.   Pat yourself dry with a CLEAN TOWEL.  Wear CLEAN PAJAMAS to bed the night before surgery  Place CLEAN SHEETS on your  bed the night before your surgery  DO NOT SLEEP WITH PETS.  Day of Surgery:  Take a shower with Antibacterial soap. Wear Clean/Comfortable clothing the morning of surgery Do not apply any deodorants/lotions.   Remember to brush your teeth WITH YOUR REGULAR TOOTHPASTE.   If you test positive for Covid, or been in contact with anyone that has tested positive in the last 10 days, please notify your surgeon.  SURGICAL WAITING ROOM VISITATION Patients having surgery or a procedure may have no more than 2 support people in the waiting area - these visitors may rotate.   Children under the age of 14 must have an adult with them who is not the patient. If the patient needs to stay at the hospital during part of their recovery, the visitor guidelines for inpatient rooms apply. Pre-op nurse will coordinate an appropriate time for 1 support person to accompany patient in pre-op.  This support person may not rotate.   Please refer to the [facility] website for the visitor guidelines for Inpatients (after your surgery is over and you are in a regular room).

## 2022-08-06 NOTE — Progress Notes (Signed)
PCP - Dr. Yetta Barre  Cardiologist - Denies  EP- Denies  Endocrine- Denies  Pulm- Denies  Chest x-ray - Denies  EKG - 11/20/21 (E)  Stress Test - Denies  ECHO - Denies  Cardiac Cath - Denies  AICD-na PM-na LOOP-na  Nerve Stimulator- Denies  Dialysis- Denies  Sleep Study - Denies CPAP - Denies  LABS- 08/08/22: CBC, BMP, PCR  ASA- Denies  ERAS- Yes- clears until 0430  HA1C- 03/27/22(E): 7.3 Fasting Blood Sugar - 80-103 Checks Blood Sugar __1-2___ times a week  Anesthesia- No  Pt denies having chest pain, sob, or fever during the pre-op phone call. All instructions explained to the pt, with a verbal understanding of the material. The opportunity to ask questions was provided.

## 2022-08-07 NOTE — Anesthesia Preprocedure Evaluation (Signed)
Anesthesia Evaluation  Patient identified by MRN, date of birth, ID band Patient awake    Reviewed: Allergy & Precautions, H&P , NPO status , Patient's Chart, lab work & pertinent test results  Airway Mallampati: II  TM Distance: >3 FB Neck ROM: Full    Dental no notable dental hx. (+) Teeth Intact, Dental Advisory Given   Pulmonary neg pulmonary ROS   Pulmonary exam normal breath sounds clear to auscultation       Cardiovascular hypertension, Normal cardiovascular exam Rhythm:Regular Rate:Normal     Neuro/Psych negative neurological ROS  negative psych ROS   GI/Hepatic negative GI ROS, Neg liver ROS,,,  Endo/Other  diabetes, Well Controlled, Type 2    Renal/GU negative Renal ROS  negative genitourinary   Musculoskeletal negative musculoskeletal ROS (+) Arthritis ,    Abdominal   Peds negative pediatric ROS (+)  Hematology negative hematology ROS (+)   Anesthesia Other Findings All: codeine, simvastatin, lisinopril  Reproductive/Obstetrics negative OB ROS                              Anesthesia Physical Anesthesia Plan  ASA: 3  Anesthesia Plan: General   Post-op Pain Management: Precedex, Ofirmev IV (intra-op)* and Toradol IV (intra-op)*   Induction: Intravenous  PONV Risk Score and Plan: Midazolam, Ondansetron and Treatment may vary due to age or medical condition  Airway Management Planned: LMA  Additional Equipment: None  Intra-op Plan:   Post-operative Plan:   Informed Consent: I have reviewed the patients History and Physical, chart, labs and discussed the procedure including the risks, benefits and alternatives for the proposed anesthesia with the patient or authorized representative who has indicated his/her understanding and acceptance.     Dental advisory given  Plan Discussed with: CRNA and Anesthesiologist  Anesthesia Plan Comments:           Anesthesia Quick Evaluation

## 2022-08-07 NOTE — H&P (Signed)
Orthopaedic Trauma Service (OTS) Consult   Patient ID: Devin Roberts MRN: [id number] DOB/AGE: 61-Jun-1962 61 y.o.    HPI: Devin Roberts is an 62 y.o. male well-known to the orthopedic trauma service for ORIF of a left pilon and syndesmosis injury on 05/28/2021.  Unfortunately he developed a nonunion and return to the OR on 11/20/2021 for removal of hardware, intramedullary nailing of his left tibia with allografting.  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  He has been exercising diligently and has been compliant with all that we have asked of him.  Patient presents today for removal of hardware of his left tibia.  Risks and benefits of surgery have been reviewed with the patient and he wishes to proceed.  Past Medical History:  Diagnosis Date   Acute asthmatic bronchitis    Allergic rhinitis    Anxiety    Chronic insomnia    COVID-19    Diabetes mellitus    DJD (degenerative joint disease)    GERD (gastroesophageal reflux disease)    Headache(784.0)    Hypercholesterolemia    Hypertension    Obesity     Past Surgical History:  Procedure Laterality Date   HARDWARE REMOVAL Left 11/20/2021   Procedure: HARDWARE REMOVAL;  Surgeon: Myrene Galas, MD;  Location: [facility] OR;  Service: Orthopedics;  Laterality: Left;   ORIF ANKLE FRACTURE Left 05/28/2021   Procedure: OPEN REDUCTION INTERNAL FIXATION (ORIF) TIBIA/FIBULA FRACTURE;  Surgeon: Myrene Galas, MD;  Location: [facility] OR;  Service: Orthopedics;  Laterality: Left;   SHOULDER SURGERY     TIBIA IM NAIL INSERTION Left 11/20/2021   Procedure: INTRAMEDULLARY (IM) NAIL TIBIAL;  Surgeon: Myrene Galas, MD;  Location: [facility] OR;  Service: Orthopedics;  Laterality: Left;    Family History  Problem Relation Age of Onset   COPD Father    Lung cancer Father    Hyperlipidemia Mother    Cancer Neg Hx    Diabetes Neg Hx    Heart disease Neg Hx    Hypertension Neg Hx    Kidney  disease Neg Hx    Stroke Neg Hx     Social History:  reports that he has never smoked. He has never used smokeless tobacco. He reports that he does not drink alcohol and does not use drugs.  Allergies:  Allergies  Allergen Reactions   Codeine Itching    Pt sts he has taken without any problems   Lisinopril Cough   Simvastatin     ZOCOR caused leg cramps    Medications:  Current Outpatient Medications  Medication Instructions   acetaminophen (TYLENOL) 500-1,000 mg, Oral, Every 6 hours PRN   docusate sodium (COLACE) 100 mg, Oral, 2 times daily   EDARBYCLOR 40-25 MG TABS 1 tablet, Oral, Daily   fexofenadine (ALLEGRA) 180 mg, Oral, Daily PRN   guaiFENesin (MUCINEX) 600 mg, Oral, 2 times daily PRN   metFORMIN (GLUCOPHAGE-XR) 750 mg, Oral, Daily with breakfast   methocarbamol (ROBAXIN) 750 mg, Oral, Every 6 hours PRN   Mounjaro 7.5 mg, Subcutaneous, Weekly   Multiple Vitamin (MULTIVITAMIN WITH MINERALS) TABS tablet 1 tablet, Oral, Daily   PARoxetine (PAXIL) 20 mg, Oral, Daily at bedtime   pravastatin (PRAVACHOL) 40 MG tablet TAKE ONE TABLET BY MOUTH AT BEDTIME   tirzepatide (MOUNJARO) 5 mg, Subcutaneous, Weekly   tiZANidine (ZANAFLEX) 2 mg, Oral, Every 8 hours PRN   Vascepa 2 g,  Oral, 2 times daily   zolpidem (AMBIEN) 10 mg, Oral, Daily at bedtime     No results found for this or any previous visit (from the past 48 hour(s)).  No results found.  Intake/Output    None      ROS As above Height 5\' 8"  (1.727 m), weight 99.3 kg. Physical Exam Constitutional:      General: He is not in acute distress.    Appearance: Normal appearance.  HENT:     Head: Normocephalic and atraumatic.  Eyes:     Extraocular Movements: Extraocular movements intact.  Cardiovascular:     Rate and Rhythm: Normal rate and regular rhythm.  Musculoskeletal:     Comments: Left lower extremity Full and symmetric knee range of motion Ankle range of motion about 20 degrees of extension to 20  degrees of flexion.  10 degrees of inversion and eversion Nontender over her previous nonunion site Subtle tenderness over his locking bolts but not too severe Distal motor and sensory functions are intact Extremity is warm + DP pulse No pitting edema No DCT  Skin:    General: Skin is warm.     Capillary Refill: Capillary refill takes less than 2 seconds.  Neurological:     General: No focal deficit present.     Mental Status: He is alert and oriented to person, place, and time.  Psychiatric:        Mood and Affect: Mood normal.        Behavior: Behavior normal.        Thought Content: Thought content normal.        Judgment: Judgment normal.     Assessment/Plan:  62 year old male with united left distal tibia fracture from work-related injury with symptomatic hardware  -Symptomatic hardware left tibia  OR for removal of hardware  No restrictions postoperatively   Weight-bear as tolerated postop, may need to use crutches for a few days but can quickly wean off   Range of motion as tolerated postop  Slowly resume activities  No swimming or pool related activities until his incisions are healed  Risks and benefits reviewed with patient he wished to proceed   Outpatient surgery    Mearl Latin, PA-C [phone] (C) 08/07/2022, 3:22 PM  [facility] [address] [place] [address] [phone] [phone] (F)    After 5pm and on the weekends please log on to Amion, go to orthopaedics and the look under the [facility] Call for the provider(s) on call. You can also call our office at [phone] and then follow the prompts to be connected to the call team.

## 2022-08-08 ENCOUNTER — Ambulatory Visit (HOSPITAL_COMMUNITY)
Admission: RE | Admit: 2022-08-08 | Discharge: 2022-08-08 | Disposition: A | Discharge status: Home / Self Care | Payer: Worker's Compensation | Attending: Orthopedic Surgery | Admitting: Orthopedic Surgery

## 2022-08-08 ENCOUNTER — Ambulatory Visit (HOSPITAL_COMMUNITY): Payer: Self-pay

## 2022-08-08 ENCOUNTER — Other Ambulatory Visit: Payer: Self-pay

## 2022-08-08 ENCOUNTER — Ambulatory Visit (HOSPITAL_BASED_OUTPATIENT_CLINIC_OR_DEPARTMENT_OTHER): Payer: Worker's Compensation | Admitting: Anesthesiology

## 2022-08-08 ENCOUNTER — Other Ambulatory Visit (HOSPITAL_COMMUNITY): Payer: Self-pay

## 2022-08-08 ENCOUNTER — Encounter (HOSPITAL_COMMUNITY)
Admission: RE | Disposition: A | Discharge status: Home / Self Care | Payer: Self-pay | Source: Home / Self Care | Attending: Orthopedic Surgery

## 2022-08-08 ENCOUNTER — Ambulatory Visit (HOSPITAL_COMMUNITY): Payer: Worker's Compensation | Admitting: Anesthesiology

## 2022-08-08 DIAGNOSIS — T8484XA Pain due to internal orthopedic prosthetic devices, implants and grafts, initial encounter: Secondary | ICD-10-CM | POA: Insufficient documentation

## 2022-08-08 DIAGNOSIS — I10 Essential (primary) hypertension: Secondary | ICD-10-CM

## 2022-08-08 DIAGNOSIS — X58XXXA Exposure to other specified factors, initial encounter: Secondary | ICD-10-CM | POA: Diagnosis not present

## 2022-08-08 DIAGNOSIS — M1990 Unspecified osteoarthritis, unspecified site: Secondary | ICD-10-CM | POA: Diagnosis not present

## 2022-08-08 DIAGNOSIS — E119 Type 2 diabetes mellitus without complications: Secondary | ICD-10-CM | POA: Diagnosis not present

## 2022-08-08 DIAGNOSIS — Z7984 Long term (current) use of oral hypoglycemic drugs: Secondary | ICD-10-CM | POA: Insufficient documentation

## 2022-08-08 HISTORY — PX: HARDWARE REMOVAL: SHX979

## 2022-08-08 LAB — CBC
HCT: 43.3 % (ref 39.0–52.0)
Hemoglobin: 14.6 g/dL (ref 13.0–17.0)
MCH: 31.7 pg (ref 26.0–34.0)
MCHC: 33.7 g/dL (ref 30.0–36.0)
MCV: 93.9 fL (ref 80.0–100.0)
Platelets: 196 10*3/uL (ref 150–400)
RBC: 4.61 MIL/uL (ref 4.22–5.81)
RDW: 12.4 % (ref 11.5–15.5)
WBC: 6 10*3/uL (ref 4.0–10.5)
nRBC: 0 % (ref 0.0–0.2)

## 2022-08-08 LAB — BASIC METABOLIC PANEL
Anion gap: 10 (ref 5–15)
BUN: 17 mg/dL (ref 6–20)
CO2: 23 mmol/L (ref 22–32)
Calcium: 9.3 mg/dL (ref 8.9–10.3)
Chloride: 105 mmol/L (ref 98–111)
Creatinine, Ser: 1.35 mg/dL — ABNORMAL HIGH (ref 0.61–1.24)
GFR, Estimated: >60 mL/min (ref >60)
Glucose, Bld: 124 mg/dL — ABNORMAL HIGH (ref 70–99)
Potassium: 3.5 mmol/L (ref 3.5–5.1)
Sodium: 138 mmol/L (ref 135–145)

## 2022-08-08 LAB — GLUCOSE, CAPILLARY
Glucose-Capillary: 118 mg/dL — ABNORMAL HIGH (ref 70–99)
Glucose-Capillary: 144 mg/dL — ABNORMAL HIGH (ref 70–99)

## 2022-08-08 SURGERY — REMOVAL, HARDWARE
Anesthesia: General | Site: Leg Lower | Laterality: Left

## 2022-08-08 MED ORDER — MIDAZOLAM HCL 2 MG/2ML IJ SOLN
INTRAMUSCULAR | Status: DC | PRN
  Administered 2022-08-08: 2 mg via INTRAVENOUS

## 2022-08-08 MED ORDER — SODIUM CHLORIDE (PF) 0.9 % IJ SOLN
INTRAMUSCULAR | Status: DC | PRN
  Administered 2022-08-08: 40 mL

## 2022-08-08 MED ORDER — ACETAMINOPHEN 500 MG PO TABS
ORAL_TABLET | ORAL | Status: AC
  Administered 2022-08-08: 1000 mg
  Filled 2022-08-08: qty 2

## 2022-08-08 MED ORDER — 0.9 % SODIUM CHLORIDE (POUR BTL) OPTIME
TOPICAL | Status: DC | PRN
  Administered 2022-08-08: 1000 mL

## 2022-08-08 MED ORDER — OXYCODONE HCL 5 MG PO TABS
ORAL_TABLET | ORAL | Status: AC
  Filled 2022-08-08: qty 1

## 2022-08-08 MED ORDER — PROPOFOL 10 MG/ML IV BOLUS
INTRAVENOUS | Status: DC | PRN
  Administered 2022-08-08: 170 mg via INTRAVENOUS

## 2022-08-08 MED ORDER — BUPIVACAINE LIPOSOME 1.3 % IJ SUSP
INTRAMUSCULAR | Status: AC
  Filled 2022-08-08: qty 20

## 2022-08-08 MED ORDER — KETOROLAC TROMETHAMINE 30 MG/ML IJ SOLN
INTRAMUSCULAR | Status: AC
  Filled 2022-08-08: qty 1

## 2022-08-08 MED ORDER — INSULIN ASPART 100 UNIT/ML IJ SOLN: 0.0000 [IU] | INTRAMUSCULAR | Status: DC | PRN

## 2022-08-08 MED ORDER — DEXAMETHASONE SODIUM PHOSPHATE 10 MG/ML IJ SOLN
INTRAMUSCULAR | Status: DC | PRN
  Administered 2022-08-08: 5 mg via INTRAVENOUS

## 2022-08-08 MED ORDER — CEFAZOLIN SODIUM-DEXTROSE 2-4 GM/100ML-% IV SOLN
2.0000 g | INTRAVENOUS | Status: AC
  Administered 2022-08-08: 2 g via INTRAVENOUS

## 2022-08-08 MED ORDER — PHENYLEPHRINE 80 MCG/ML (10ML) SYRINGE FOR IV PUSH (FOR BLOOD PRESSURE SUPPORT)
PREFILLED_SYRINGE | INTRAVENOUS | Status: AC
  Filled 2022-08-08: qty 10

## 2022-08-08 MED ORDER — ONDANSETRON 4 MG PO TBDP
4.0000 mg | ORAL_TABLET | Freq: Three times a day (TID) | ORAL | 0 refills | Status: DC | PRN
  Filled 2022-08-08: qty 20, 7d supply, fill #0

## 2022-08-08 MED ORDER — LACTATED RINGERS IV SOLN: INTRAVENOUS | Status: DC

## 2022-08-08 MED ORDER — ONDANSETRON HCL 4 MG/2ML IJ SOLN
INTRAMUSCULAR | Status: AC
  Filled 2022-08-08: qty 2

## 2022-08-08 MED ORDER — ORAL CARE MOUTH RINSE: 15.0000 mL | Freq: Once | OROMUCOSAL | Status: AC

## 2022-08-08 MED ORDER — ONDANSETRON HCL 4 MG/2ML IJ SOLN: 4.0000 mg | Freq: Once | INTRAMUSCULAR | Status: DC | PRN

## 2022-08-08 MED ORDER — OXYCODONE-ACETAMINOPHEN 5-325 MG PO TABS: 1.0000 | ORAL_TABLET | Freq: Three times a day (TID) | ORAL | 0 refills | Status: DC | PRN

## 2022-08-08 MED ORDER — DEXAMETHASONE SODIUM PHOSPHATE 10 MG/ML IJ SOLN
INTRAMUSCULAR | Status: AC
  Filled 2022-08-08: qty 1

## 2022-08-08 MED ORDER — ONDANSETRON 4 MG PO TBDP: 4.0000 mg | ORAL_TABLET | Freq: Three times a day (TID) | ORAL | 0 refills | Status: DC | PRN

## 2022-08-08 MED ORDER — LIDOCAINE 2% (20 MG/ML) 5 ML SYRINGE
INTRAMUSCULAR | Status: AC
  Filled 2022-08-08: qty 5

## 2022-08-08 MED ORDER — KETOROLAC TROMETHAMINE 30 MG/ML IJ SOLN: 30.0000 mg | Freq: Once | INTRAMUSCULAR | Status: DC | PRN

## 2022-08-08 MED ORDER — HYDROMORPHONE HCL 1 MG/ML IJ SOLN: 0.2500 mg | INTRAMUSCULAR | Status: DC | PRN

## 2022-08-08 MED ORDER — FENTANYL CITRATE (PF) 250 MCG/5ML IJ SOLN
INTRAMUSCULAR | Status: AC
  Filled 2022-08-08: qty 5

## 2022-08-08 MED ORDER — KETOROLAC TROMETHAMINE 10 MG PO TABS: 10.0000 mg | ORAL_TABLET | Freq: Four times a day (QID) | ORAL | 0 refills | Status: DC | PRN

## 2022-08-08 MED ORDER — LIDOCAINE 2% (20 MG/ML) 5 ML SYRINGE
INTRAMUSCULAR | Status: DC | PRN
  Administered 2022-08-08: 100 mg via INTRAVENOUS

## 2022-08-08 MED ORDER — KETOROLAC TROMETHAMINE 30 MG/ML IJ SOLN
INTRAMUSCULAR | Status: DC | PRN
  Administered 2022-08-08: 30 mg via INTRAVENOUS

## 2022-08-08 MED ORDER — CHLORHEXIDINE GLUCONATE 0.12 % MT SOLN
15.0000 mL | Freq: Once | OROMUCOSAL | Status: AC
  Administered 2022-08-08: 15 mL via OROMUCOSAL

## 2022-08-08 MED ORDER — PHENYLEPHRINE 80 MCG/ML (10ML) SYRINGE FOR IV PUSH (FOR BLOOD PRESSURE SUPPORT)
PREFILLED_SYRINGE | INTRAVENOUS | Status: DC | PRN
  Administered 2022-08-08: 160 ug via INTRAVENOUS
  Administered 2022-08-08 (×3): 80 ug via INTRAVENOUS
  Administered 2022-08-08 (×2): 160 ug via INTRAVENOUS

## 2022-08-08 MED ORDER — OXYCODONE HCL 5 MG PO TABS
5.0000 mg | ORAL_TABLET | Freq: Once | ORAL | Status: AC | PRN
  Administered 2022-08-08: 5 mg via ORAL

## 2022-08-08 MED ORDER — MIDAZOLAM HCL 2 MG/2ML IJ SOLN
INTRAMUSCULAR | Status: AC
  Filled 2022-08-08: qty 2

## 2022-08-08 MED ORDER — ONDANSETRON HCL 4 MG/2ML IJ SOLN
INTRAMUSCULAR | Status: DC | PRN
  Administered 2022-08-08: 4 mg via INTRAVENOUS

## 2022-08-08 MED ORDER — OXYCODONE HCL 5 MG/5ML PO SOLN: 5.0000 mg | Freq: Once | ORAL | Status: AC | PRN

## 2022-08-08 MED ORDER — PROPOFOL 10 MG/ML IV BOLUS
INTRAVENOUS | Status: AC
  Filled 2022-08-08: qty 20

## 2022-08-08 MED ORDER — DEXMEDETOMIDINE HCL IN NACL 80 MCG/20ML IV SOLN
INTRAVENOUS | Status: DC | PRN
  Administered 2022-08-08 (×2): 6 ug via INTRAVENOUS

## 2022-08-08 MED ORDER — OXYCODONE-ACETAMINOPHEN 5-325 MG PO TABS
1.0000 | ORAL_TABLET | Freq: Three times a day (TID) | ORAL | 0 refills | Status: DC | PRN
  Filled 2022-08-08: qty 30, 5d supply, fill #0

## 2022-08-08 MED ORDER — FENTANYL CITRATE (PF) 250 MCG/5ML IJ SOLN
INTRAMUSCULAR | Status: DC | PRN
  Administered 2022-08-08 (×3): 50 ug via INTRAVENOUS

## 2022-08-08 MED ORDER — KETOROLAC TROMETHAMINE 10 MG PO TABS
10.0000 mg | ORAL_TABLET | Freq: Four times a day (QID) | ORAL | 0 refills | Status: DC | PRN
  Filled 2022-08-08: qty 20, 5d supply, fill #0

## 2022-08-08 SURGICAL SUPPLY — 68 items
BAG COUNTER SPONGE SURGICOUNT (BAG) ×1 IMPLANT
BAG SPNG CNTER NS LX DISP (BAG) ×1
BANDAGE ESMARK 6X9 LF (GAUZE/BANDAGES/DRESSINGS) ×1 IMPLANT
BNDG CMPR 5X6 CHSV STRCH STRL (GAUZE/BANDAGES/DRESSINGS) ×1
BNDG CMPR 5X62 HK CLSR LF (GAUZE/BANDAGES/DRESSINGS) ×1
BNDG CMPR 9X6 STRL LF SNTH (GAUZE/BANDAGES/DRESSINGS)
BNDG COHESIVE 6X5 TAN ST LF (GAUZE/BANDAGES/DRESSINGS) ×1 IMPLANT
BNDG ELASTIC 4X5.8 VLCR STR LF (GAUZE/BANDAGES/DRESSINGS) ×1 IMPLANT
BNDG ELASTIC 6INX 5YD STR LF (GAUZE/BANDAGES/DRESSINGS) IMPLANT
BNDG ELASTIC 6X5.8 VLCR STR LF (GAUZE/BANDAGES/DRESSINGS) ×1 IMPLANT
BNDG ESMARK 6X9 LF (GAUZE/BANDAGES/DRESSINGS)
BNDG GAUZE DERMACEA FLUFF 4 (GAUZE/BANDAGES/DRESSINGS) ×2 IMPLANT
BNDG GZE DERMACEA 4 6PLY (GAUZE/BANDAGES/DRESSINGS) ×1
BRUSH SCRUB EZ PLAIN DRY (MISCELLANEOUS) ×2 IMPLANT
COVER SURGICAL LIGHT HANDLE (MISCELLANEOUS) ×2 IMPLANT
CUFF TOURN SGL QUICK 18X4 (TOURNIQUET CUFF) IMPLANT
CUFF TOURN SGL QUICK 24 (TOURNIQUET CUFF)
CUFF TOURN SGL QUICK 34 (TOURNIQUET CUFF)
CUFF TRNQT CYL 24X4X16.5-23 (TOURNIQUET CUFF) IMPLANT
CUFF TRNQT CYL 34X4.125X (TOURNIQUET CUFF) IMPLANT
DRAPE C-ARM 42X72 X-RAY (DRAPES) IMPLANT
DRAPE C-ARMOR (DRAPES) ×1 IMPLANT
DRAPE U-SHAPE 47X51 STRL (DRAPES) ×1 IMPLANT
DRSG ADAPTIC 3X8 NADH LF (GAUZE/BANDAGES/DRESSINGS) ×1 IMPLANT
ELECT REM PT RETURN 9FT ADLT (ELECTROSURGICAL) ×1
ELECTRODE REM PT RTRN 9FT ADLT (ELECTROSURGICAL) ×1 IMPLANT
GAUZE PAD ABD 8X10 STRL (GAUZE/BANDAGES/DRESSINGS) IMPLANT
GAUZE SPONGE 4X4 12PLY STRL (GAUZE/BANDAGES/DRESSINGS) ×1 IMPLANT
GLOVE BIO SURGEON STRL SZ7.5 (GLOVE) ×1 IMPLANT
GLOVE BIO SURGEON STRL SZ8 (GLOVE) ×1 IMPLANT
GLOVE BIOGEL PI IND STRL 7.5 (GLOVE) ×1 IMPLANT
GLOVE BIOGEL PI IND STRL 8 (GLOVE) ×1 IMPLANT
GLOVE SURG ORTHO LTX SZ7.5 (GLOVE) ×2 IMPLANT
GOWN STRL REUS W/ TWL LRG LVL3 (GOWN DISPOSABLE) ×2 IMPLANT
GOWN STRL REUS W/ TWL XL LVL3 (GOWN DISPOSABLE) ×1 IMPLANT
GOWN STRL REUS W/TWL LRG LVL3 (GOWN DISPOSABLE) ×2
GOWN STRL REUS W/TWL XL LVL3 (GOWN DISPOSABLE) ×1
KIT BASIN OR (CUSTOM PROCEDURE TRAY) ×1 IMPLANT
KIT TURNOVER KIT B (KITS) ×1 IMPLANT
MANIFOLD NEPTUNE II (INSTRUMENTS) ×1 IMPLANT
NDL 22X1.5 STRL (OR ONLY) (MISCELLANEOUS) IMPLANT
NDL HYPO 22X1.5 SAFETY MO (MISCELLANEOUS) IMPLANT
NEEDLE 22X1.5 STRL (OR ONLY) (MISCELLANEOUS) IMPLANT
NEEDLE HYPO 22X1.5 SAFETY MO (MISCELLANEOUS) ×1 IMPLANT
NS IRRIG 1000ML POUR BTL (IV SOLUTION) ×1 IMPLANT
PACK ORTHO EXTREMITY (CUSTOM PROCEDURE TRAY) ×1 IMPLANT
PAD ARMBOARD 7.5X6 YLW CONV (MISCELLANEOUS) ×2 IMPLANT
PADDING CAST COTTON 6X4 STRL (CAST SUPPLIES) ×3 IMPLANT
SLEEVE SURGEON STRL (DRAPES) IMPLANT
SPONGE T-LAP 18X18 ~~LOC~~+RFID (SPONGE) ×1 IMPLANT
STAPLER VISISTAT 35W (STAPLE) IMPLANT
STOCKINETTE IMPERVIOUS LG (DRAPES) ×1 IMPLANT
STRIP CLOSURE SKIN 1/2X4 (GAUZE/BANDAGES/DRESSINGS) IMPLANT
SUCTION FRAZIER HANDLE 10FR (MISCELLANEOUS) ×1
SUCTION TUBE FRAZIER 10FR DISP (MISCELLANEOUS) IMPLANT
SUT ETHILON 2 0 FS 18 (SUTURE) IMPLANT
SUT PDS AB 2-0 CT1 27 (SUTURE) IMPLANT
SUT VIC AB 0 CT1 27 (SUTURE)
SUT VIC AB 0 CT1 27XBRD ANBCTR (SUTURE) IMPLANT
SUT VIC AB 2-0 CT1 27 (SUTURE) ×1
SUT VIC AB 2-0 CT1 TAPERPNT 27 (SUTURE) IMPLANT
SYR CONTROL 10ML LL (SYRINGE) IMPLANT
TOWEL GREEN STERILE (TOWEL DISPOSABLE) ×2 IMPLANT
TOWEL GREEN STERILE FF (TOWEL DISPOSABLE) ×2 IMPLANT
TUBE CONNECTING 12X1/4 (SUCTIONS) ×1 IMPLANT
UNDERPAD 30X36 HEAVY ABSORB (UNDERPADS AND DIAPERS) ×1 IMPLANT
WATER STERILE IRR 1000ML POUR (IV SOLUTION) ×2 IMPLANT
YANKAUER SUCT BULB TIP NO VENT (SUCTIONS) ×1 IMPLANT

## 2022-08-08 NOTE — Op Note (Signed)
08/08/2022  1:13 PM  PATIENT:  Devin Roberts  Dec 23, 1960 male   MEDICAL RECORD NUMBER: [id number]  PRE-OPERATIVE DIAGNOSIS:  SYMPTOMATIC HARDWARE LEFT LEG AND ANKLE  POST-OPERATIVE DIAGNOSIS:  SYMPTOMATIC HARDWARE LEFT LEG AND ANKLE  PROCEDURE:   REMOVAL OF TIBIAL NAIL AND LOCKING SCREWS. MANUAL APPLICATION OF STRESS TO LEFT TIBIA UNDER FLUOROSCOPY.  SURGEON:  Doralee Albino. Carola Frost, M.D.  ASSISTANT:  None.  ANESTHESIA:  General.  COMPLICATIONS:  None.  TOURNIQUET: None.  SPECIMENS: None.  ESTIMATED BLOOD LOSS:  @EBL @.  DISPOSITION:  To PACU.  CONDITION:  Stable.  DELAY START OF DVT PROPHYLAXIS BECAUSE OF BLEEDING RISK: NO  BRIEF SUMMARY OF INDICATION FOR PROCEDURE:  Devin Roberts is a pleasant 62 y.o. who sustained remote tibia fracture complicated by nonunion.  The patient went on to unite the nonunion and now presents for elective removal of the hardware because of continued tenderness about the implants that did not resolve with observation or conservative measures. The patient and I discussed the risks and benefits of surgery including the possibility of failure to alleviate symptoms, need for further surgery, DVT, PE, heart attack, stroke, anesthetic complications, infection, bleeding and others. After acknowledging these risks the patient provided consent to proceed.  BRIEF SUMMARY OF PROCEDURE:  After administration of preoperative antibiotics, the patient was taken to the operating room where general anesthesia was induced.  The operative lower extremity was prepped and draped in usual sterile fashion.  Time-out was held.  C-arm was brought in to confirm the appropriate hardware position. I remade the old incisions used for screw and nail insertion. I cleared the head of each screw with a small clamp tip, then used the screwdriver to withdraw each locking bolt. At the anterior aspect of the knee, I remade the medial parapatellar retinacular incision.  A curette was  initially advanced into the center of the nail and then the extraction bolt inserted, engaging it while placing a curette in a locking bolt site to prevent rotation. The nail was extracted without difficulty, being careful to avoid injury to the surrounding bone and soft tissues.   As there was an antereromedial to posterior screw distally, a more formal approach was required to this area to avoid saphenous vessel injury. My assistant retracted the vein laterally allowing for direct visualization and removal of the screw.    After removal of the implants, I once again applied manual varus and valgus stress to the old tibia fracture site under fluoroscopy. I did not observe any cantilever of bending at the old fracture site to suggest occult nonunion.   The wounds were irrigated thoroughly and closed in standard layered fashion with 0 Vicryl for the retinaculum, 2-0 Vicryl and 2-0 nylon for the skin.  Sterile gently compressive dressing was applied and then Ace wrap from foot to thigh.  The patient was awakened from anesthesia and transported to PACU in stable condition.  Devin Morita, PA- C, did assist me throughout with the implant removal by stabilizing the knee, preventing rotation during engagement of the extraction bolt, and wound closure.  PROGNOSIS:  Devin Roberts will be weightbearing as tolerated.  Ok to shower in 2 days. Oozing from the bone is anticipated. Ice and elevate. We will plan to see back for removal of sutures in 10-14 days. Given the expectation for immediate mobilization additional formal pharmacologic DVT prophylaxis has not been prescribed. He also works in an environment concerning for contamination and so return to work would be predicated on  complete wound sealing/ healing, likely around 3 weeks.     Doralee Albino. Carola Frost, M.D.

## 2022-08-08 NOTE — Discharge Instructions (Addendum)
Orthopaedic Trauma Service Discharge Instructions   General Discharge Instructions   WEIGHT BEARING STATUS: Weightbearing as tolerated Left leg   RANGE OF MOTION/ACTIVITY: unrestricted motion of left knee and ankle. Slowly increase activity. May need to use crutches of a few days     Wound Care: daily wound care starting on 5/182024  Discharge Wound Care Instructions  Do NOT apply any ointments, solutions or lotions to pin sites or surgical wounds.  These prevent needed drainage and even though solutions like hydrogen peroxide kill bacteria, they also damage cells lining the pin sites that help fight infection.  Applying lotions or ointments can keep the wounds moist and can cause them to breakdown and open up as well. This can increase the risk for infection. When in doubt call the office.  Surgical incisions should be dressed daily.  If any drainage is noted, use one layer of adaptic or Mepitel, then gauze, Kerlix, and an ace wrap.  [email or web address] [email or web address]/?pd_rd_[email or web address]&[email or web address]  [email or web address].[email or web address]  These dressing supplies should be available at local medical supply stores ([employer], [employer], etc). They are not usually carried at places like [facility], [facility], [employer], etc  Once the incision is completely dry and without drainage, it may be left open to air out.  Showering may begin 36-48 hours later.  Cleaning gently with soap and water.  Traumatic wounds should be dressed daily as well.    One layer of adaptic, gauze, Kerlix, then ace wrap.  The adaptic can be discontinued once the draining has ceased    If you have a wet to dry dressing: wet the gauze with saline the squeeze as much saline out so the gauze is moist (not soaking wet), place moistened gauze  over wound, then place a dry gauze over the moist one, followed by Kerlix wrap, then ace wrap.  Diet: as you were eating previously.  Can use over the counter stool softeners and bowel preparations, such as Miralax, to help with bowel movements.  Narcotics can be constipating.  Be sure to drink plenty of fluids  PAIN MEDICATION USE AND EXPECTATIONS  You have likely been given narcotic medications to help control your pain.  After a traumatic event that results in an fracture (broken bone) with or without surgery, it is ok to use narcotic pain medications to help control one's pain.  We understand that everyone responds to pain differently and each individual patient will be evaluated on a regular basis for the continued need for narcotic medications. Ideally, narcotic medication use should last no more than 6-8 weeks (coinciding with fracture healing).   As a patient it is your responsibility as well to monitor narcotic medication use and report the amount and frequency you use these medications when you come to your office visit.   We would also advise that if you are using narcotic medications, you should take a dose prior to therapy to maximize you participation.  IF YOU ARE ON NARCOTIC MEDICATIONS IT IS NOT PERMISSIBLE TO OPERATE A MOTOR VEHICLE (MOTORCYCLE/CAR/TRUCK/MOPED) OR HEAVY MACHINERY DO NOT MIX NARCOTICS WITH OTHER CNS (CENTRAL NERVOUS SYSTEM) DEPRESSANTS SUCH AS ALCOHOL   POST-OPERATIVE OPIOID TAPER INSTRUCTIONS: It is important to wean off of your opioid medication as soon as possible. If you do not need pain medication after your surgery it is ok to stop day one. Opioids include: Codeine, Hydrocodone(Norco, Vicodin), Oxycodone(Percocet, oxycontin) and hydromorphone amongst others.  Long term and even short term use of opiods can cause: Increased pain response Dependence Constipation  Depression Respiratory depression And more.  Withdrawal symptoms can include Flu like  symptoms Nausea, vomiting And more Techniques to manage these symptoms Hydrate well Eat regular healthy meals Stay active Use relaxation techniques(deep breathing, meditating, yoga) Do Not substitute Alcohol to help with tapering If you have been on opioids for less than two weeks and do not have pain than it is ok to stop all together.  Plan to wean off of opioids This plan should start within one week post op of your fracture surgery  Maintain the same interval or time between taking each dose and first decrease the dose.  Cut the total daily intake of opioids by one tablet each day Next start to increase the time between doses. The last dose that should be eliminated is the evening dose.    STOP SMOKING OR USING NICOTINE PRODUCTS!!!!  As discussed nicotine severely impairs your body's ability to heal surgical and traumatic wounds but also impairs bone healing.  Wounds and bone heal by forming microscopic blood vessels (angiogenesis) and nicotine is a vasoconstrictor (essentially, shrinks blood vessels).  Therefore, if vasoconstriction occurs to these microscopic blood vessels they essentially disappear and are unable to deliver necessary nutrients to the healing tissue.  This is one modifiable factor that you can do to dramatically increase your chances of healing your injury.    (This means no smoking, no nicotine gum, patches, etc)  DO NOT USE NONSTEROIDAL ANTI-INFLAMMATORY DRUGS (NSAID'S)  Using products such as Advil (ibuprofen), Aleve (naproxen), Motrin (ibuprofen) for additional pain control during fracture healing can delay and/or prevent the healing response.  If you would like to take over the counter (OTC) medication, Tylenol (acetaminophen) is ok.  However, some narcotic medications that are given for pain control contain acetaminophen as well. Therefore, you should not exceed more than 4000 mg of tylenol in a day if you do not have liver disease.  Also note that there are may  OTC medicines, such as cold medicines and allergy medicines that my contain tylenol as well.  If you have any questions about medications and/or interactions please ask your doctor/PA or your pharmacist.      ICE AND ELEVATE INJURED/OPERATIVE EXTREMITY  Using ice and elevating the injured extremity above your heart can help with swelling and pain control.  Icing in a pulsatile fashion, such as 20 minutes on and 20 minutes off, can be followed.    Do not place ice directly on skin. Make sure there is a barrier between to skin and the ice pack.    Using frozen items such as frozen peas works well as the conform nicely to the are that needs to be iced.  USE AN ACE WRAP OR TED HOSE FOR SWELLING CONTROL  In addition to icing and elevation, Ace wraps or TED hose are used to help limit and resolve swelling.  It is recommended to use Ace wraps or TED hose until you are informed to stop.    When using Ace Wraps start the wrapping distally (farthest away from the body) and wrap proximally (closer to the body)   Example: If you had surgery on your leg or thing and you do not have a splint on, start the ace wrap at the toes and work your way up to the thigh        If you had surgery on your upper extremity and do not have a splint on, start the ace wrap at your fingers and work your way up to  the upper arm  IF YOU ARE IN A SPLINT OR CAST DO NOT REMOVE IT FOR ANY REASON   If your splint gets wet for any reason please contact the office immediately. You may shower in your splint or cast as long as you keep it dry.  This can be done by wrapping in a cast cover or garbage back (or similar)  Do Not stick any thing down your splint or cast such as pencils, money, or hangers to try and scratch yourself with.  If you feel itchy take benadryl as prescribed on the bottle for itching  IF YOU ARE IN A CAM BOOT (BLACK BOOT)  You may remove boot periodically. Perform daily dressing changes as noted below.  Wash the liner  of the boot regularly and wear a sock when wearing the boot. It is recommended that you sleep in the boot until told otherwise    Call office for the following: Temperature greater than 101F Persistent nausea and vomiting Severe uncontrolled pain Redness, tenderness, or signs of infection (pain, swelling, redness, odor or green/yellow discharge around the site) Difficulty breathing, headache or visual disturbances Hives Persistent dizziness or light-headedness Extreme fatigue Any other questions or concerns you may have after discharge  In an emergency, call 911 or go to an Emergency Department at a nearby hospital  HELPFUL INFORMATION  If you had a block, it will wear off between 8-24 hrs postop typically.  This is period when your pain may go from nearly zero to the pain you would have had postop without the block.  This is an abrupt transition but nothing dangerous is happening.  You may take an extra dose of narcotic when this happens.  You should wean off your narcotic medicines as soon as you are able.  Most patients will be off or using minimal narcotics before their first postop appointment.   We suggest you use the pain medication the first night prior to going to bed, in order to ease any pain when the anesthesia wears off. You should avoid taking pain medications on an empty stomach as it will make you nauseous.  Do not drink alcoholic beverages or take illicit drugs when taking pain medications.  In most states it is against the law to drive while you are in a splint or sling.  And certainly against the law to drive while taking narcotics.  You may return to work/school in the next couple of days when you feel up to it.   Pain medication may make you constipated.  Below are a few solutions to try in this order: Decrease the amount of pain medication if you aren't having pain. Drink lots of decaffeinated fluids. Drink prune juice and/or each dried prunes  If the first 3  don't work start with additional solutions Take Colace - an over-the-counter stool softener Take Senokot - an over-the-counter laxative Take Miralax - a stronger over-the-counter laxative     CALL THE OFFICE WITH ANY QUESTIONS OR CONCERNS: [phone]   VISIT OUR WEBSITE FOR ADDITIONAL INFORMATION: [email or web address]

## 2022-08-08 NOTE — Anesthesia Procedure Notes (Signed)
Procedure Name: LMA Insertion Date/Time: 08/08/2022 8:25 AM  Performed by: Quentin Ore, CRNAPre-anesthesia Checklist: Patient identified, Emergency Drugs available, Suction available and Patient being monitored Patient Re-evaluated:Patient Re-evaluated prior to induction Oxygen Delivery Method: Circle system utilized Preoxygenation: Pre-oxygenation with 100% oxygen Induction Type: IV induction LMA: LMA inserted LMA Size: 4.0 Number of attempts: 1 Placement Confirmation: positive ETCO2 and breath sounds checked- equal and bilateral Tube secured with: Tape Dental Injury: Teeth and Oropharynx as per pre-operative assessment

## 2022-08-08 NOTE — Anesthesia Postprocedure Evaluation (Signed)
Anesthesia Post Note  Patient: Yehuda Savannah  Procedure(s) Performed: REMOVAL OF HARDWARE LEFT TIBIA (Left: Leg Lower)     Patient location during evaluation: PACU Anesthesia Type: General Level of consciousness: awake and alert Pain management: pain level controlled Vital Signs Assessment: post-procedure vital signs reviewed and stable Respiratory status: spontaneous breathing, nonlabored ventilation, respiratory function stable and patient connected to nasal cannula oxygen Cardiovascular status: blood pressure returned to baseline and stable Postop Assessment: no apparent nausea or vomiting Anesthetic complications: no  No notable events documented.  Last Vitals:  Vitals:   08/08/22 1015 08/08/22 1030  BP: 118/80 112/74  Pulse: 67 64  Resp: 20 20  Temp:  36.7 C  SpO2: 97% 94%    Last Pain:  Vitals:   08/08/22 1030  TempSrc:   PainSc: 4                  Trevor Iha

## 2022-08-08 NOTE — Transfer of Care (Signed)
Immediate Anesthesia Transfer of Care Note  Patient: Devin Roberts  Procedure(s) Performed: REMOVAL OF HARDWARE LEFT TIBIA (Left: Leg Lower)  Patient Location: PACU  Anesthesia Type:General  Level of Consciousness: awake, alert , and oriented  Airway & Oxygen Therapy: Patient Spontanous Breathing  Post-op Assessment: Report given to RN, Post -op Vital signs reviewed and stable, and Patient moving all extremities X 4  Post vital signs: Reviewed and stable  Last Vitals:  Vitals Value Taken Time  BP 126/77 08/08/22 1002  Temp 36.6 C 08/08/22 1000  Pulse 65 08/08/22 1005  Resp 15 08/08/22 1005  SpO2 93 % 08/08/22 1005  Vitals shown include unvalidated device data.  Last Pain:  Vitals:   08/08/22 1000  TempSrc:   PainSc: 0-No pain      Patients Stated Pain Goal: 0 (08/06/22 1627)  Complications: No notable events documented.

## 2022-08-09 ENCOUNTER — Encounter (HOSPITAL_COMMUNITY): Payer: Self-pay | Admitting: Orthopedic Surgery

## 2022-08-14 ENCOUNTER — Other Ambulatory Visit: Payer: Self-pay | Admitting: Internal Medicine

## 2022-08-14 DIAGNOSIS — E118 Type 2 diabetes mellitus with unspecified complications: Secondary | ICD-10-CM

## 2022-08-14 MED ORDER — TIRZEPATIDE 2.5 MG/0.5ML ~~LOC~~ SOAJ
2.5000 mg | SUBCUTANEOUS | 0 refills | Status: DC
Start: 2022-08-14 — End: 2022-10-08

## 2022-08-30 ENCOUNTER — Ambulatory Visit (AMBULATORY_SURGERY_CENTER): Payer: Self-pay | Admitting: *Deleted

## 2022-08-30 ENCOUNTER — Telehealth: Payer: Self-pay | Admitting: *Deleted

## 2022-08-30 VITALS — Ht 68.0 in | Wt 219.0 lb

## 2022-08-30 DIAGNOSIS — Z86010 Personal history of colonic polyps: Secondary | ICD-10-CM

## 2022-08-30 MED ORDER — NA SULFATE-K SULFATE-MG SULF 17.5-3.13-1.6 GM/177ML PO SOLN: 1.0000 | Freq: Once | ORAL | 0 refills | Status: AC

## 2022-08-30 NOTE — Telephone Encounter (Signed)
L 

## 2022-08-30 NOTE — Progress Notes (Signed)
Pt's name and DOB verified at the beginning of the pre-visit.  Pt denies any difficulty with ambulating,sitting, laying down or rolling side to side Gave both [facility] main # and MD on call # prior to instructions.  No egg or soy allergy known to patient  No issues known to pt with past sedation with any surgeries or procedures Pt denies having issues being intubated Pt has no issues moving head neck or swallowing No FH of Malignant Hyperthermia Pt is not on diet pills Pt is not on home 02  Pt is not on blood thinners  Pt denies issues with constipation  Pt is not on dialysis Pt denise any abnormal heart rhythms  Pt denies any upcoming cardiac testing Pt encouraged to use to use Singlecare or Goodrx to reduce cost  Patient's chart reviewed by Cathlyn Parsons CNRA prior to pre-visit and patient appropriate for the [facility].  Pre-visit completed and red dot placed by patient's name on their procedure day (on provider's schedule).  . Visit by phone Pt states weight is 219 lb Instructed pt why it is important to and  to call if they have any changes in health or new medications. Directed them to the # given and on instructions.   Pt states they will.  Instructions reviewed with pt and pt states understanding. Instructed to review again prior to procedure. Pt states they will.  Instructions sent by mail with coupon and by my chart

## 2022-09-11 ENCOUNTER — Encounter: Payer: Self-pay | Admitting: Internal Medicine

## 2022-09-18 ENCOUNTER — Other Ambulatory Visit: Payer: Self-pay | Admitting: Internal Medicine

## 2022-09-18 DIAGNOSIS — F5104 Psychophysiologic insomnia: Secondary | ICD-10-CM

## 2022-09-18 DIAGNOSIS — E118 Type 2 diabetes mellitus with unspecified complications: Secondary | ICD-10-CM

## 2022-09-24 ENCOUNTER — Ambulatory Visit: Payer: PRIVATE HEALTH INSURANCE | Admitting: Internal Medicine

## 2022-09-25 ENCOUNTER — Encounter: Payer: Self-pay | Admitting: Internal Medicine

## 2022-09-25 ENCOUNTER — Ambulatory Visit (AMBULATORY_SURGERY_CENTER): Payer: Managed Care, Other (non HMO) | Admitting: Internal Medicine

## 2022-09-25 VITALS — BP 115/75 | HR 64 | Temp 97.8°F | Resp 17 | Ht 68.0 in | Wt 219.0 lb

## 2022-09-25 DIAGNOSIS — Z09 Encounter for follow-up examination after completed treatment for conditions other than malignant neoplasm: Secondary | ICD-10-CM

## 2022-09-25 DIAGNOSIS — D122 Benign neoplasm of ascending colon: Secondary | ICD-10-CM

## 2022-09-25 DIAGNOSIS — Z86010 Personal history of colonic polyps: Secondary | ICD-10-CM | POA: Diagnosis not present

## 2022-09-25 DIAGNOSIS — K620 Anal polyp: Secondary | ICD-10-CM | POA: Diagnosis not present

## 2022-09-25 DIAGNOSIS — K6289 Other specified diseases of anus and rectum: Secondary | ICD-10-CM | POA: Diagnosis not present

## 2022-09-25 MED ORDER — SODIUM CHLORIDE 0.9 % IV SOLN
500.0000 mL | Freq: Once | INTRAVENOUS | Status: DC
Start: 2022-09-25 — End: 2022-09-25

## 2022-09-25 NOTE — Progress Notes (Signed)
Uneventful anesthetic. Report to pacu rn. Vss. Care resumed by rn. 

## 2022-09-25 NOTE — Progress Notes (Signed)
GASTROENTEROLOGY PROCEDURE H&P NOTE   Primary Care Physician: Etta Grandchild, MD    Reason for Procedure:  Surveillance for history of colonic polyps  Plan:    Colonoscopy  Patient is appropriate for endoscopic procedure(s) in the ambulatory ([facility]) setting.  The nature of the procedure, as well as the risks, benefits, and alternatives were carefully and thoroughly reviewed with the patient. Ample time for discussion and questions allowed. The patient understood, was satisfied, and agreed to proceed.     HPI: Devin Roberts is a 62 y.o. male who presents for surveillance colonoscopy.  Medical history as below.  Tolerated the prep.  No recent chest pain or shortness of breath.  No abdominal pain today.  Past Medical History:  Diagnosis Date   Acute asthmatic bronchitis    Allergic rhinitis    Chronic insomnia    Chronic kidney disease    kidney stone   COVID-19    Diabetes mellitus    DJD (degenerative joint disease)    GERD (gastroesophageal reflux disease)    Headache(784.0)    Hypercholesterolemia    Hypertension    Obesity     Past Surgical History:  Procedure Laterality Date   HARDWARE REMOVAL Left 11/20/2021   Procedure: HARDWARE REMOVAL;  Surgeon: Myrene Galas, MD;  Location: [facility] OR;  Service: Orthopedics;  Laterality: Left;   HARDWARE REMOVAL Left 08/08/2022   Procedure: REMOVAL OF HARDWARE LEFT TIBIA;  Surgeon: Myrene Galas, MD;  Location: [facility] OR;  Service: Orthopedics;  Laterality: Left;   ORIF ANKLE FRACTURE Left 05/28/2021   Procedure: OPEN REDUCTION INTERNAL FIXATION (ORIF) TIBIA/FIBULA FRACTURE;  Surgeon: Myrene Galas, MD;  Location: [facility] OR;  Service: Orthopedics;  Laterality: Left;   SHOULDER SURGERY     TIBIA IM NAIL INSERTION Left 11/20/2021   Procedure: INTRAMEDULLARY (IM) NAIL TIBIAL;  Surgeon: Myrene Galas, MD;  Location: [facility] OR;  Service: Orthopedics;  Laterality: Left;    Prior to Admission medications   Medication Sig Start Date End Date  Taking? Authorizing Provider  acetaminophen (TYLENOL) 500 MG tablet Take 1-2 tablets (500-1,000 mg total) by mouth every 6 (six) hours as needed for moderate pain. 11/20/21  Yes Montez Morita, PA-C  EDARBYCLOR 40-25 MG TABS TAKE ONE TABLET BY MOUTH ONCE DAILY. 07/22/22  Yes Etta Grandchild, MD  metFORMIN (GLUCOPHAGE-XR) 750 MG 24 hr tablet TAKE ONE TABLET BY MOUTH DAILY WITH BREAKFAST 09/18/22  Yes Etta Grandchild, MD  methocarbamol (ROBAXIN) 750 MG tablet Take 750 mg by mouth every 6 (six) hours as needed for muscle spasms.   Yes [provider]  Multiple Vitamin (MULTIVITAMIN WITH MINERALS) TABS tablet Take 1 tablet by mouth daily.   Yes [provider]  pravastatin (PRAVACHOL) 40 MG tablet TAKE ONE TABLET BY MOUTH AT BEDTIME 07/30/22  Yes Etta Grandchild, MD  VASCEPA 1 g capsule TAKE TWO CAPSULES BY MOUTH TWICE DAILY 11/23/21  Yes Etta Grandchild, MD  zolpidem (AMBIEN) 10 MG tablet Take 1 tablet (10 mg total) by mouth at bedtime. 09/18/22  Yes Etta Grandchild, MD  docusate sodium (COLACE) 100 MG capsule TAKE ONE CAPSULE BY MOUTH TWICE DAILY Patient taking differently: Take 100 mg by mouth daily as needed for mild constipation. 11/12/21   Etta Grandchild, MD  fexofenadine (ALLEGRA) 180 MG tablet Take 180 mg by mouth daily as needed for allergies or rhinitis.    [provider]  guaiFENesin (MUCINEX) 600 MG 12 hr tablet Take 600 mg by mouth 2 (  two) times daily as needed for cough or to loosen phlegm. Patient not taking: Reported on 08/30/2022    [provider]  ketorolac (TORADOL) 10 MG tablet Take 1 tablet (10 mg total) by mouth every 6 (six) hours as needed for moderate pain. Patient not taking: Reported on 08/30/2022 08/08/22   Montez Morita, PA-C  ondansetron (ZOFRAN-ODT) 4 MG disintegrating tablet Take 1 tablet (4 mg total) by mouth every 8 (eight) hours as needed. Patient not taking: Reported on 08/30/2022 08/08/22   Montez Morita, PA-C  oxyCODONE-acetaminophen (PERCOCET) 5-325  MG tablet Take 1-2 tablets by mouth every 8 (eight) hours as needed for severe pain. Patient not taking: Reported on 08/30/2022 08/08/22 08/08/23  Montez Morita, PA-C  PARoxetine (PAXIL) 20 MG tablet Take 1 tablet (20 mg total) by mouth at bedtime. Patient not taking: Reported on 08/06/2022 07/04/21   Etta Grandchild, MD  tirzepatide [facility]) 2.5 MG/0.5ML Pen Inject 2.5 mg into the skin once a week. 08/14/22   Etta Grandchild, MD  tiZANidine (ZANAFLEX) 2 MG tablet Take 2 mg by mouth every 8 (eight) hours as needed for muscle spasms. Patient not taking: Reported on 09/25/2022 09/26/21   [provider]    Current Outpatient Medications  Medication Sig Dispense Refill   acetaminophen (TYLENOL) 500 MG tablet Take 1-2 tablets (500-1,000 mg total) by mouth every 6 (six) hours as needed for moderate pain. 30 tablet 0   EDARBYCLOR 40-25 MG TABS TAKE ONE TABLET BY MOUTH ONCE DAILY. 90 tablet 0   metFORMIN (GLUCOPHAGE-XR) 750 MG 24 hr tablet TAKE ONE TABLET BY MOUTH DAILY WITH BREAKFAST 90 tablet 0   methocarbamol (ROBAXIN) 750 MG tablet Take 750 mg by mouth every 6 (six) hours as needed for muscle spasms.     Multiple Vitamin (MULTIVITAMIN WITH MINERALS) TABS tablet Take 1 tablet by mouth daily.     pravastatin (PRAVACHOL) 40 MG tablet TAKE ONE TABLET BY MOUTH AT BEDTIME 90 tablet 0   VASCEPA 1 g capsule TAKE TWO CAPSULES BY MOUTH TWICE DAILY 360 capsule 1   zolpidem (AMBIEN) 10 MG tablet Take 1 tablet (10 mg total) by mouth at bedtime. 90 tablet 0   docusate sodium (COLACE) 100 MG capsule TAKE ONE CAPSULE BY MOUTH TWICE DAILY (Patient taking differently: Take 100 mg by mouth daily as needed for mild constipation.) 60 capsule 0   fexofenadine (ALLEGRA) 180 MG tablet Take 180 mg by mouth daily as needed for allergies or rhinitis.     guaiFENesin (MUCINEX) 600 MG 12 hr tablet Take 600 mg by mouth 2 (two) times daily as needed for cough or to loosen phlegm. (Patient not taking: Reported on 08/30/2022)      ketorolac (TORADOL) 10 MG tablet Take 1 tablet (10 mg total) by mouth every 6 (six) hours as needed for moderate pain. (Patient not taking: Reported on 08/30/2022) 20 tablet 0   ondansetron (ZOFRAN-ODT) 4 MG disintegrating tablet Take 1 tablet (4 mg total) by mouth every 8 (eight) hours as needed. (Patient not taking: Reported on 08/30/2022) 20 tablet 0   oxyCODONE-acetaminophen (PERCOCET) 5-325 MG tablet Take 1-2 tablets by mouth every 8 (eight) hours as needed for severe pain. (Patient not taking: Reported on 08/30/2022) 30 tablet 0   PARoxetine (PAXIL) 20 MG tablet Take 1 tablet (20 mg total) by mouth at bedtime. (Patient not taking: Reported on 08/06/2022) 30 tablet 0   tirzepatide (MOUNJARO) 2.5 MG/0.5ML Pen Inject 2.5 mg into the skin once a week. 2 mL  0   tiZANidine (ZANAFLEX) 2 MG tablet Take 2 mg by mouth every 8 (eight) hours as needed for muscle spasms. (Patient not taking: Reported on 09/25/2022)     Current Facility-Administered Medications  Medication Dose Route Frequency Provider Last Rate Last Admin   0.9 %  sodium chloride infusion  500 mL Intravenous Once Kellie Murrill, Carie Caddy, MD        Allergies as of 09/25/2022 - Review Complete 09/25/2022  Allergen Reaction Noted   Codeine Itching 08/26/2017   Lisinopril Cough    Simvastatin      Family History  Problem Relation Age of Onset   Hyperlipidemia Mother    COPD Father    Lung cancer Father    Cancer Neg Hx    Diabetes Neg Hx    Heart disease Neg Hx    Hypertension Neg Hx    Kidney disease Neg Hx    Stroke Neg Hx    Colon cancer Neg Hx    Colon polyps Neg Hx    Stomach cancer Neg Hx    Rectal cancer Neg Hx     Social History   Socioeconomic History   Marital status: Married    Spouse name: Renee   Number of children: 0   Years of education: Not on file   Highest education level: Not on file  Occupational History   Not on file  Tobacco Use   Smoking status: Never   Smokeless tobacco: Never  Vaping Use   Vaping Use:  Never used  Substance and Sexual Activity   Alcohol use: No   Drug use: No   Sexual activity: Yes  Other Topics Concern   Not on file  Social History Narrative   Not on file   Social Determinants of Health   Financial Resource Strain: Not on file  Food Insecurity: Not on file  Transportation Needs: Not on file  Physical Activity: Not on file  Stress: Not on file  Social Connections: Not on file  Intimate Partner Violence: Not on file    Physical Exam: Vital signs in last 24 hours: @BP  126/80   Pulse 66   Temp 97.8 F (36.6 C)   Ht 5\' 8"  (1.727 m)   Wt 219 lb (99.3 kg)   SpO2 96%   BMI 33.30 kg/m  GEN: NAD EYE: Sclerae anicteric ENT: MMM CV: Non-tachycardic Pulm: CTA b/l GI: Soft, NT/ND NEURO:  Alert & Oriented x 3   Erick Blinks, MD [facility]  09/25/2022 8:03 AM

## 2022-09-25 NOTE — Patient Instructions (Signed)
Handout on polyps and diverticulosis given to patient Await pathology results Resume previous diet and continue present medications Repeat colonoscopy in 5 years with a 2-day prep for surveillance!   YOU HAD AN ENDOSCOPIC PROCEDURE TODAY AT THE [facility]:   Refer to the procedure report that was given to you for any specific questions about what was found during the examination.  If the procedure report does not answer your questions, please call your gastroenterologist to clarify.  If you requested that your care partner not be given the details of your procedure findings, then the procedure report has been included in a sealed envelope for you to review at your convenience later.  YOU SHOULD EXPECT: Some feelings of bloating in the abdomen. Passage of more gas than usual.  Walking can help get rid of the air that was put into your GI tract during the procedure and reduce the bloating. If you had a lower endoscopy (such as a colonoscopy or flexible sigmoidoscopy) you may notice spotting of blood in your stool or on the toilet paper. If you underwent a bowel prep for your procedure, you may not have a normal bowel movement for a few days.  Please Note:  You might notice some irritation and congestion in your nose or some drainage.  This is from the oxygen used during your procedure.  There is no need for concern and it should clear up in a day or so.  SYMPTOMS TO REPORT IMMEDIATELY:  Following lower endoscopy (colonoscopy or flexible sigmoidoscopy):  Excessive amounts of blood in the stool  Significant tenderness or worsening of abdominal pains  Swelling of the abdomen that is new, acute  Fever of 100F or higher  For urgent or emergent issues, a gastroenterologist can be reached at any hour by calling [phone]. Do not use MyChart messaging for urgent concerns.    DIET:  We do recommend a small meal at first, but then you may proceed to your regular diet.  Drink plenty of  fluids but you should avoid alcoholic beverages for 24 hours.  ACTIVITY:  You should plan to take it easy for the rest of today and you should NOT DRIVE or use heavy machinery until tomorrow (because of the sedation medicines used during the test).    FOLLOW UP: Our staff will call the number listed on your records the next business day following your procedure.  We will call around 7:15- 8:00 am to check on you and address any questions or concerns that you may have regarding the information given to you following your procedure. If we do not reach you, we will leave a message.     If any biopsies were taken you will be contacted by phone or by letter within the next 1-3 weeks.  Please call us at [phone] if you have not heard about the biopsies in 3 weeks.    SIGNATURES/CONFIDENTIALITY: You and/or your care partner have signed paperwork which will be entered into your electronic medical record.  These signatures attest to the fact that that the information above on your After Visit Summary has been reviewed and is understood.  Full responsibility of the confidentiality of this discharge information lies with you and/or your care-partner.

## 2022-09-25 NOTE — Progress Notes (Signed)
Pt's states no medical or surgical changes since previsit or office visit. 

## 2022-09-25 NOTE — Op Note (Signed)
[facility] Patient Name: Devin Roberts Procedure Date: 09/25/2022 6:53 AM MRN: [id number] Endoscopist: Beverley Fiedler , MD, [id number] Age: 62 Referring MD:  Date of Birth: Dec 14, 1960 Gender: Male Account #: [id number] Procedure:                Colonoscopy Indications:              High risk colon cancer surveillance: Personal                            history of colonic polyps, Last colonoscopy: 2019 Medicines:                Monitored Anesthesia Care Procedure:                Pre-Anesthesia Assessment:                           - Prior to the procedure, a History and Physical                            was performed, and patient medications and                            allergies were reviewed. The patient's tolerance of                            previous anesthesia was also reviewed. The risks                            and benefits of the procedure and the sedation                            options and risks were discussed with the patient.                            All questions were answered, and informed consent                            was obtained. Prior Anticoagulants: The patient has                            taken no anticoagulant or antiplatelet agents. ASA                            Grade Assessment: II - A patient with mild systemic                            disease. After reviewing the risks and benefits,                            the patient was deemed in satisfactory condition to                            undergo the procedure.  After obtaining informed consent, the colonoscope                            was passed under direct vision. Throughout the                            procedure, the patient's blood pressure, pulse, and                            oxygen saturations were monitored continuously. The                            CF [id number] #[id number] was introduced through the anus                            and advanced to  the cecum, identified by                            appendiceal orifice and ileocecal valve. The                            colonoscopy was performed without difficulty. The                            patient tolerated the procedure well. The quality                            of the bowel preparation was good (after copious                            irrigation and lavage). The ileocecal valve,                            appendiceal orifice, and rectum were photographed. Scope In: 8:08:33 AM Scope Out: 8:28:51 AM Scope Withdrawal Time: 0 hours 15 minutes 29 seconds  Total Procedure Duration: 0 hours 20 minutes 18 seconds  Findings:                 Skin tags were found on perianal exam.                           A 4 mm polyp was found in the ascending colon. The                            polyp was sessile. The polyp was removed with a                            cold snare. Resection was complete, but the polyp                            tissue was not retrieved.                           Multiple small-mouthed diverticula were found  in                            the sigmoid colon.                           A 5 mm polyp was found in the anus. The polyp was                            semi-pedunculated. Biopsies were taken with a cold                            forceps for histology.                           No additional abnormalities were found on                            retroflexion. Complications:            No immediate complications. Estimated Blood Loss:     Estimated blood loss: none. Impression:               - One 4 mm polyp in the ascending colon, removed                            with a cold snare. Complete resection. Polyp tissue                            not retrieved.                           - Mild diverticulosis in the sigmoid colon.                           - One 5 mm polyp at the anus. Biopsied to exclude                            AIN (versus skin  tag). Recommendation:           - Patient has a contact number available for                            emergencies. The signs and symptoms of potential                            delayed complications were discussed with the                            patient. Return to normal activities tomorrow.                            Written discharge instructions were provided to the                            patient.                           -  Resume previous diet.                           - Continue present medications.                           - Await pathology results.                           - Repeat colonoscopy in 5 years for surveillance                            with 2 day prep. Beverley Fiedler, MD 09/25/2022 8:39:17 AM This report has been signed electronically.

## 2022-09-25 NOTE — Progress Notes (Signed)
Called to room to assist during endoscopic procedure.  Patient ID and intended procedure confirmed with present staff. Received instructions for my participation in the procedure from the performing physician.  

## 2022-09-27 ENCOUNTER — Telehealth: Payer: Self-pay

## 2022-09-27 NOTE — Telephone Encounter (Signed)
  Follow up Call-     09/25/2022    7:18 AM  Call back number  Post procedure Call Back phone  # [phone]  Permission to leave phone message Yes     Patient questions:  Do you have a fever, pain , or abdominal swelling? No. Pain Score  0 *  Have you tolerated food without any problems? Yes.    Have you been able to return to your normal activities? Yes.    Do you have any questions about your discharge instructions: Diet   No. Medications  No. Follow up visit  No.  Do you have questions or concerns about your Care? No.  Actions: * If pain score is 4 or above: No action needed, pain <4.

## 2022-10-01 ENCOUNTER — Encounter: Payer: Self-pay | Admitting: Internal Medicine

## 2022-10-03 ENCOUNTER — Ambulatory Visit: Payer: Managed Care, Other (non HMO) | Admitting: Internal Medicine

## 2022-10-03 ENCOUNTER — Encounter: Payer: Self-pay | Admitting: Internal Medicine

## 2022-10-03 VITALS — BP 138/86 | HR 65 | Temp 97.9°F | Ht 68.0 in | Wt 216.0 lb

## 2022-10-03 DIAGNOSIS — E781 Pure hyperglyceridemia: Secondary | ICD-10-CM

## 2022-10-03 DIAGNOSIS — E785 Hyperlipidemia, unspecified: Secondary | ICD-10-CM

## 2022-10-03 DIAGNOSIS — Z125 Encounter for screening for malignant neoplasm of prostate: Secondary | ICD-10-CM | POA: Diagnosis not present

## 2022-10-03 DIAGNOSIS — M545 Low back pain, unspecified: Secondary | ICD-10-CM

## 2022-10-03 DIAGNOSIS — Z0001 Encounter for general adult medical examination with abnormal findings: Secondary | ICD-10-CM

## 2022-10-03 DIAGNOSIS — I10 Essential (primary) hypertension: Secondary | ICD-10-CM

## 2022-10-03 DIAGNOSIS — Z23 Encounter for immunization: Secondary | ICD-10-CM

## 2022-10-03 DIAGNOSIS — E118 Type 2 diabetes mellitus with unspecified complications: Secondary | ICD-10-CM

## 2022-10-03 DIAGNOSIS — K5904 Chronic idiopathic constipation: Secondary | ICD-10-CM

## 2022-10-03 LAB — HEMOGLOBIN A1C: Hgb A1c MFr Bld: 6.2 % (ref 4.6–6.5)

## 2022-10-03 LAB — MICROALBUMIN / CREATININE URINE RATIO
Creatinine,U: 158.5 mg/dL
Microalb Creat Ratio: 0.5 mg/g (ref 0.0–30.0)
Microalb, Ur: 0.7 mg/dL (ref 0.0–1.9)

## 2022-10-03 LAB — PSA: PSA: 2.16 ng/mL (ref 0.10–4.00)

## 2022-10-03 LAB — URINALYSIS, ROUTINE W REFLEX MICROSCOPIC
Bilirubin Urine: NEGATIVE
Hgb urine dipstick: NEGATIVE
Ketones, ur: NEGATIVE
Leukocytes,Ua: NEGATIVE
Nitrite: NEGATIVE
RBC / HPF: NONE SEEN (ref 0–?)
Specific Gravity, Urine: 1.02 (ref 1.000–1.030)
Total Protein, Urine: NEGATIVE
Urine Glucose: NEGATIVE
Urobilinogen, UA: 0.2 (ref 0.0–1.0)
WBC, UA: NONE SEEN (ref 0–?)
pH: 6.5 (ref 5.0–8.0)

## 2022-10-03 LAB — HEPATIC FUNCTION PANEL
ALT: 18 U/L (ref 0–53)
AST: 18 U/L (ref 0–37)
Albumin: 4.5 g/dL (ref 3.5–5.2)
Alkaline Phosphatase: 55 U/L (ref 39–117)
Bilirubin, Direct: 0.1 mg/dL (ref 0.0–0.3)
Total Bilirubin: 0.7 mg/dL (ref 0.2–1.2)
Total Protein: 7 g/dL (ref 6.0–8.3)

## 2022-10-03 LAB — LIPID PANEL
Cholesterol: 173 mg/dL (ref 0–200)
HDL: 44.4 mg/dL (ref >39.00)
LDL Cholesterol: 110 mg/dL — ABNORMAL HIGH (ref 0–99)
NonHDL: 128.65
Total CHOL/HDL Ratio: 4
Triglycerides: 95 mg/dL (ref 0.0–149.0)
VLDL: 19 mg/dL (ref 0.0–40.0)

## 2022-10-03 LAB — TSH: TSH: 0.68 u[IU]/mL (ref 0.35–5.50)

## 2022-10-03 NOTE — Progress Notes (Signed)
Subjective:  Patient ID: Devin Roberts, male    DOB: 30-Mar-1960  Age: 62 y.o. MRN: [id number]  CC: Annual Exam, Hypertension, Diabetes, Hyperlipidemia, and Back Pain   HPI Che L Oberhaus presents for a CPX and f/up ---  Discussed the use of AI scribe software for clinical note transcription with the patient, who gave verbal consent to proceed.  History of Present Illness   The patient, who is known to be physically active with regular gym visits and weight lifting, reports no recent chest pain, shortness of breath, dizziness, or lightheadedness. They deny any symptoms suggestive of diabetes such as excessive thirst or urination, and there have been no drastic changes in weight or appetite.  For pain management, the patient is currently taking Tylenol and occasionally a muscle relaxer for back pain. They deny taking Toradol or Zofran, but confirm the use of Tizanidine for muscle spasms.  The patient recently underwent a colonoscopy and had stitches removed from a recent operation. Post-removal, there was concern for infection due to redness around the site, which was managed with a 10-day course of antibiotics. The patient reports improvement in the appearance of the site since the completion of antibiotics. They deny any history of smoking or drinking.        Outpatient Medications Prior to Visit  Medication Sig Dispense Refill   acetaminophen (TYLENOL) 500 MG tablet Take 1-2 tablets (500-1,000 mg total) by mouth every 6 (six) hours as needed for moderate pain. 30 tablet 0   docusate sodium (COLACE) 100 MG capsule TAKE ONE CAPSULE BY MOUTH TWICE DAILY (Patient taking differently: Take 100 mg by mouth daily as needed for mild constipation.) 60 capsule 0   EDARBYCLOR 40-25 MG TABS TAKE ONE TABLET BY MOUTH ONCE DAILY. 90 tablet 0   fexofenadine (ALLEGRA) 180 MG tablet Take 180 mg by mouth daily as needed for allergies or rhinitis.     guaiFENesin (MUCINEX) 600 MG 12 hr tablet Take 600 mg  by mouth 2 (two) times daily as needed for cough or to loosen phlegm.     Multiple Vitamin (MULTIVITAMIN WITH MINERALS) TABS tablet Take 1 tablet by mouth daily.     PARoxetine (PAXIL) 20 MG tablet Take 1 tablet (20 mg total) by mouth at bedtime. 30 tablet 0   pravastatin (PRAVACHOL) 40 MG tablet TAKE ONE TABLET BY MOUTH AT BEDTIME 90 tablet 0   VASCEPA 1 g capsule TAKE TWO CAPSULES BY MOUTH TWICE DAILY 360 capsule 1   zolpidem (AMBIEN) 10 MG tablet Take 1 tablet (10 mg total) by mouth at bedtime. 90 tablet 0   ketorolac (TORADOL) 10 MG tablet Take 1 tablet (10 mg total) by mouth every 6 (six) hours as needed for moderate pain. 20 tablet 0   metFORMIN (GLUCOPHAGE-XR) 750 MG 24 hr tablet TAKE ONE TABLET BY MOUTH DAILY WITH BREAKFAST 90 tablet 0   methocarbamol (ROBAXIN) 750 MG tablet Take 750 mg by mouth every 6 (six) hours as needed for muscle spasms.     ondansetron (ZOFRAN-ODT) 4 MG disintegrating tablet Take 1 tablet (4 mg total) by mouth every 8 (eight) hours as needed. 20 tablet 0   oxyCODONE-acetaminophen (PERCOCET) 5-325 MG tablet Take 1-2 tablets by mouth every 8 (eight) hours as needed for severe pain. 30 tablet 0   tirzepatide (MOUNJARO) 2.5 MG/0.5ML Pen Inject 2.5 mg into the skin once a week. 2 mL 0   tiZANidine (ZANAFLEX) 2 MG tablet Take 2 mg by mouth every 8 (eight) hours  as needed for muscle spasms.     No facility-administered medications prior to visit.    ROS Review of Systems  Constitutional: Negative.  Negative for diaphoresis, fatigue and unexpected weight change.  HENT: Negative.    Eyes: Negative.   Respiratory:  Negative for cough, chest tightness, shortness of breath and wheezing.   Cardiovascular:  Negative for chest pain, palpitations and leg swelling.  Gastrointestinal:  Positive for constipation. Negative for abdominal pain, diarrhea, nausea and vomiting.  Endocrine: Negative.   Genitourinary: Negative.  Negative for difficulty urinating.  Musculoskeletal:   Positive for arthralgias, back pain and neck stiffness. Negative for joint swelling and myalgias.  Neurological: Negative.  Negative for dizziness and weakness.  Hematological:  Negative for adenopathy. Does not bruise/bleed easily.  Psychiatric/Behavioral: Negative.      Objective:  BP 138/86 (BP Location: Left Arm, Patient Position: Sitting, Cuff Size: Large)   Pulse 65   Temp 97.9 F (36.6 C) (Oral)   Ht 5\' 8"  (1.727 m)   Wt 216 lb (98 kg)   SpO2 92%   BMI 32.84 kg/m   BP Readings from Last 3 Encounters:  10/03/22 138/86  09/25/22 115/75  08/08/22 112/74    Wt Readings from Last 3 Encounters:  10/03/22 216 lb (98 kg)  09/25/22 219 lb (99.3 kg)  08/30/22 219 lb (99.3 kg)    Physical Exam Vitals reviewed.  Constitutional:      Appearance: Normal appearance. He is obese.  HENT:     Mouth/Throat:     Mouth: Mucous membranes are moist.  Eyes:     General: No scleral icterus.    Conjunctiva/sclera: Conjunctivae normal.  Cardiovascular:     Rate and Rhythm: Normal rate and regular rhythm.     Heart sounds: No murmur heard.    No friction rub. No gallop.     Comments: EKG- NSR, 61 bpm No LVH, Q waves, or ST/T wave changes Pulmonary:     Effort: Pulmonary effort is normal.     Breath sounds: No stridor. No wheezing, rhonchi or rales.  Abdominal:     General: Abdomen is protuberant. Bowel sounds are normal. There is no distension.     Palpations: Abdomen is soft. There is no hepatomegaly, splenomegaly or mass.     Tenderness: There is no abdominal tenderness. There is no guarding or rebound.     Hernia: No hernia is present.  Musculoskeletal:        General: Normal range of motion.     Cervical back: Neck supple.     Right lower leg: No edema.     Left lower leg: No edema.  Lymphadenopathy:     Cervical: No cervical adenopathy.  Skin:    General: Skin is warm and dry.  Neurological:     General: No focal deficit present.     Mental Status: He is alert. Mental  status is at baseline.  Psychiatric:        Mood and Affect: Mood normal.        Behavior: Behavior normal.     Lab Results  Component Value Date   WBC 6.0 08/08/2022   HGB 14.6 08/08/2022   HCT 43.3 08/08/2022   PLT 196 08/08/2022   GLUCOSE 124 (H) 08/08/2022   CHOL 173 10/03/2022   TRIG 95.0 10/03/2022   HDL 44.40 10/03/2022   LDLDIRECT 66.0 11/16/2018   LDLCALC 110 (H) 10/03/2022   ALT 18 10/03/2022   AST 18 10/03/2022   NA 138  08/08/2022   K 3.5 08/08/2022   CL 105 08/08/2022   CREATININE 1.35 (H) 08/08/2022   BUN 17 08/08/2022   CO2 23 08/08/2022   TSH 0.68 10/03/2022   PSA 2.16 10/03/2022   INR 0.9 11/20/2021   HGBA1C 6.2 10/03/2022   MICROALBUR 0.7 10/03/2022    DG Tibia/Fibula Left Port  Result Date: 08/08/2022 CLINICAL DATA:  Painful orthopedic hardware status post removal. EXAM: PORTABLE LEFT TIBIA AND FIBULA - 2 VIEW COMPARISON:  Operative fluoroscopy left tibia and fibula 08/08/2022, left ankle radiographs 11/20/2021 and CT left ankle 05/21/2022 FINDINGS: Interval removal of the prior tibial intramedullary nail and proximal and distal interlocking screws. Cortical thickening irregularity from now healed distal tibial and proximal fibular fractures. There are proximal and distal tibial screw tracts. Mild chronic enthesopathic change at the quadriceps insertion on the patella. Intra-articular air is seen within the knee consistent with recent surgery. IMPRESSION: Interval removal of tibial intramedullary nail hardware. Electronically Signed   By: Neita Garnet M.D.   On: 08/08/2022 11:16   DG Tibia/Fibula Left  Result Date: 08/08/2022 CLINICAL DATA:  Removal of hardware left tibia. Intraoperative fluoroscopy. EXAM: LEFT TIBIA AND FIBULA - 2 VIEW COMPARISON:  Left ankle radiographs 11/20/2021 and 05/28/2021; intraoperative fluoroscopy left tibia and fibula 11/20/2021 FINDINGS: Images were performed intraoperatively without the presence of a radiologist. The prior  tibial intramedullary nail and distal and proximal interlocking screws are no longer visualized. Resultant screw tracts are noted. Irregularity from remote fracture deformities within the distal tibia and proximal fibular diaphyses. Total fluoroscopy images: 5 Total fluoroscopy time: 56 seconds Total dose: Radiation Exposure Index (as provided by the fluoroscopic device): 1.41 mGy air Kerma Please see intraoperative findings for further detail. IMPRESSION: Intraoperative fluoroscopy provided for removal of hardware from the left tibia. Electronically Signed   By: Neita Garnet M.D.   On: 08/08/2022 09:52   DG C-Arm 1-60 Min-No Report  Result Date: 08/08/2022 Fluoroscopy was utilized by the requesting physician.  No radiographic interpretation.    Assessment & Plan:   Essential hypertension- EKG is neg for LVH. His blood pressure is adequately well-controlled. -     Urinalysis, Routine w reflex microscopic; Future -     TSH; Future -     EKG 12-Lead  Pure hypertriglyceridemia -     Hepatic function panel; Future  Type II diabetes mellitus with manifestations (HCC)- His blood sugar is well-controlled. -     Hemoglobin A1c; Future -     Microalbumin / creatinine urine ratio; Future -     HM Diabetes Foot Exam -     CT CARDIAC SCORING ([facility] LOCATIONS ONLY); Future  Hyperlipidemia with target LDL less than 100- He has not achieved his LDL goal.  Will add Zetia to the statin. -     Lipid panel; Future -     Lipoprotein A (LPA); Future -     TSH; Future -     Hepatic function panel; Future -     CT CARDIAC SCORING ([facility] LOCATIONS ONLY); Future -     Ezetimibe; Take 1 tablet (10 mg total) by mouth daily.  Dispense: 90 tablet; Refill: 1  Chronic idiopathic constipation -     TSH; Future  Encounter for general adult medical examination with abnormal findings- Exam completed, labs reviewed, vaccines reviewed and updated, cancer screenings are up-to-date, patient education was given. -     PSA;  Future  Low back pain at multiple sites -  tiZANidine HCl; Take 1 tablet (2 mg total) by mouth every 8 (eight) hours as needed for muscle spasms.  Dispense: 270 tablet; Refill: 1  Other orders -     Varicella-zoster vaccine IM     Follow-up: Return in about 6 months (around 04/05/2023).  Sanda Linger, MD

## 2022-10-03 NOTE — Patient Instructions (Signed)
Health Maintenance, Male Adopting a healthy lifestyle and getting preventive care are important in promoting health and wellness. Ask your health care provider about: The right schedule for you to have regular tests and exams. Things you can do on your own to prevent diseases and keep yourself healthy. What should I know about diet, weight, and exercise? Eat a healthy diet  Eat a diet that includes plenty of vegetables, fruits, low-fat dairy products, and lean protein. Do not eat a lot of foods that are high in solid fats, added sugars, or sodium. Maintain a healthy weight Body mass index (BMI) is a measurement that can be used to identify possible weight problems. It estimates body fat based on height and weight. Your health care provider can help determine your BMI and help you achieve or maintain a healthy weight. Get regular exercise Get regular exercise. This is one of the most important things you can do for your health. Most adults should: Exercise for at least 150 minutes each week. The exercise should increase your heart rate and make you sweat (moderate-intensity exercise). Do strengthening exercises at least twice a week. This is in addition to the moderate-intensity exercise. Spend less time sitting. Even light physical activity can be beneficial. Watch cholesterol and blood lipids Have your blood tested for lipids and cholesterol at 61 years of age, then have this test every 5 years. You may need to have your cholesterol levels checked more often if: Your lipid or cholesterol levels are high. You are older than 61 years of age. You are at high risk for heart disease. What should I know about cancer screening? Many types of cancers can be detected early and may often be prevented. Depending on your health history and family history, you may need to have cancer screening at various ages. This may include screening for: Colorectal cancer. Prostate cancer. Skin cancer. Lung  cancer. What should I know about heart disease, diabetes, and high blood pressure? Blood pressure and heart disease High blood pressure causes heart disease and increases the risk of stroke. This is more likely to develop in people who have high blood pressure readings or are overweight. Talk with your health care provider about your target blood pressure readings. Have your blood pressure checked: Every 3-5 years if you are 18-39 years of age. Every year if you are 40 years old or older. If you are between the ages of 65 and 75 and are a current or former smoker, ask your health care provider if you should have a one-time screening for abdominal aortic aneurysm (AAA). Diabetes Have regular diabetes screenings. This checks your fasting blood sugar level. Have the screening done: Once every three years after age 45 if you are at a normal weight and have a low risk for diabetes. More often and at a younger age if you are overweight or have a high risk for diabetes. What should I know about preventing infection? Hepatitis B If you have a higher risk for hepatitis B, you should be screened for this virus. Talk with your health care provider to find out if you are at risk for hepatitis B infection. Hepatitis C Blood testing is recommended for: Everyone born from 1945 through 1965. Anyone with known risk factors for hepatitis C. Sexually transmitted infections (STIs) You should be screened each year for STIs, including gonorrhea and chlamydia, if: You are sexually active and are younger than 61 years of age. You are older than 61 years of age and your   health care provider tells you that you are at risk for this type of infection. Your sexual activity has changed since you were last screened, and you are at increased risk for chlamydia or gonorrhea. Ask your health care provider if you are at risk. Ask your health care provider about whether you are at high risk for HIV. Your health care provider  may recommend a prescription medicine to help prevent HIV infection. If you choose to take medicine to prevent HIV, you should first get tested for HIV. You should then be tested every 3 months for as long as you are taking the medicine. Follow these instructions at home: Alcohol use Do not drink alcohol if your health care provider tells you not to drink. If you drink alcohol: Limit how much you have to 0-2 drinks a day. Know how much alcohol is in your drink. In the [place] one drink equals one 12 oz bottle of beer (355 mL), one 5 oz glass of wine (148 mL), or one 1 oz glass of hard liquor (44 mL). Lifestyle Do not use any products that contain nicotine or tobacco. These products include cigarettes, chewing tobacco, and vaping devices, such as e-cigarettes. If you need help quitting, ask your health care provider. Do not use street drugs. Do not share needles. Ask your health care provider for help if you need support or information about quitting drugs. General instructions Schedule regular health, dental, and eye exams. Stay current with your vaccines. Tell your health care provider if: You often feel depressed. You have ever been abused or do not feel safe at home. Summary Adopting a healthy lifestyle and getting preventive care are important in promoting health and wellness. Follow your health care provider's instructions about healthy diet, exercising, and getting tested or screened for diseases. Follow your health care provider's instructions on monitoring your cholesterol and blood pressure. This information is not intended to replace advice given to you by your health care provider. Make sure you discuss any questions you have with your health care provider. Document Revised: 07/31/2020 Document Reviewed: 07/31/2020 Elsevier Patient Education  2024 Elsevier Inc.  

## 2022-10-04 ENCOUNTER — Encounter: Payer: Self-pay | Admitting: Internal Medicine

## 2022-10-07 LAB — LIPOPROTEIN A (LPA): Lipoprotein (a): 38 nmol/L (ref <75)

## 2022-10-08 ENCOUNTER — Other Ambulatory Visit: Payer: Self-pay | Admitting: Internal Medicine

## 2022-10-08 MED ORDER — TIZANIDINE HCL 2 MG PO TABS
2.0000 mg | ORAL_TABLET | Freq: Three times a day (TID) | ORAL | 1 refills | Status: DC | PRN
Start: 2022-10-08 — End: 2023-05-06

## 2022-10-09 ENCOUNTER — Encounter: Payer: Self-pay | Admitting: Internal Medicine

## 2022-10-10 MED ORDER — EZETIMIBE 10 MG PO TABS
10.0000 mg | ORAL_TABLET | Freq: Every day | ORAL | 1 refills | Status: DC
Start: 2022-10-10 — End: 2023-04-03

## 2022-10-17 ENCOUNTER — Other Ambulatory Visit: Payer: Self-pay | Admitting: Internal Medicine

## 2022-10-17 ENCOUNTER — Telehealth: Payer: Self-pay | Admitting: Internal Medicine

## 2022-10-17 DIAGNOSIS — E785 Hyperlipidemia, unspecified: Secondary | ICD-10-CM

## 2022-10-17 NOTE — Telephone Encounter (Signed)
Verlon Au from [facility] called and said they need the order for the cardiac scoring CT to be re-faxed to them at [phone]. She is unsure what happened to the original order. Best callback is [phone].

## 2022-10-21 ENCOUNTER — Other Ambulatory Visit: Payer: Self-pay | Admitting: Internal Medicine

## 2022-10-21 DIAGNOSIS — I10 Essential (primary) hypertension: Secondary | ICD-10-CM

## 2022-10-25 ENCOUNTER — Ambulatory Visit
Admission: RE | Admit: 2022-10-25 | Discharge: 2022-10-25 | Disposition: A | Discharge status: Home / Self Care | Payer: No Typology Code available for payment source | Source: Ambulatory Visit | Attending: Internal Medicine | Admitting: Internal Medicine

## 2022-10-25 DIAGNOSIS — E785 Hyperlipidemia, unspecified: Secondary | ICD-10-CM

## 2022-10-25 DIAGNOSIS — E118 Type 2 diabetes mellitus with unspecified complications: Secondary | ICD-10-CM

## 2022-11-06 ENCOUNTER — Telehealth: Payer: Self-pay

## 2022-11-06 NOTE — Telephone Encounter (Signed)
Pharmacy Patient Advocate Encounter   Received notification from CoverMyMeds that prior authorization for Vascepa 1GM capsules is required/requested.   Insurance verification completed.   The patient is insured through [facility] .   Per test claim: PA required; PA submitted to [facility] via CoverMyMeds Key/confirmation #/EOC [id number] Status is pending

## 2022-11-07 NOTE — Telephone Encounter (Signed)
Pharmacy Patient Advocate Encounter  Received notification from [facility] that Prior Authorization for Vascepa 1GM capsules has been APPROVED from 11/06/22 to 11/06/23   PA #/Case ID/Reference #: [id number]    Sponsored Renewals

## 2022-11-13 ENCOUNTER — Other Ambulatory Visit: Payer: Self-pay | Admitting: Internal Medicine

## 2022-11-13 DIAGNOSIS — E785 Hyperlipidemia, unspecified: Secondary | ICD-10-CM

## 2022-12-17 ENCOUNTER — Other Ambulatory Visit: Payer: Self-pay | Admitting: Internal Medicine

## 2022-12-17 DIAGNOSIS — F5104 Psychophysiologic insomnia: Secondary | ICD-10-CM

## 2023-01-24 ENCOUNTER — Other Ambulatory Visit: Payer: Self-pay | Admitting: Internal Medicine

## 2023-01-24 DIAGNOSIS — E118 Type 2 diabetes mellitus with unspecified complications: Secondary | ICD-10-CM

## 2023-01-24 DIAGNOSIS — I10 Essential (primary) hypertension: Secondary | ICD-10-CM

## 2023-01-24 MED ORDER — EDARBYCLOR 40-25 MG PO TABS: 1.0000 | ORAL_TABLET | Freq: Every day | ORAL | 0 refills | Status: DC

## 2023-02-01 ENCOUNTER — Other Ambulatory Visit: Payer: Self-pay | Admitting: Internal Medicine

## 2023-02-01 DIAGNOSIS — E785 Hyperlipidemia, unspecified: Secondary | ICD-10-CM

## 2023-03-24 ENCOUNTER — Other Ambulatory Visit: Payer: Self-pay | Admitting: Internal Medicine

## 2023-03-24 DIAGNOSIS — F5104 Psychophysiologic insomnia: Secondary | ICD-10-CM

## 2023-04-03 ENCOUNTER — Other Ambulatory Visit: Payer: Self-pay | Admitting: Internal Medicine

## 2023-04-03 DIAGNOSIS — E785 Hyperlipidemia, unspecified: Secondary | ICD-10-CM

## 2023-04-07 ENCOUNTER — Other Ambulatory Visit: Payer: Self-pay | Admitting: Internal Medicine

## 2023-04-11 ENCOUNTER — Other Ambulatory Visit: Payer: Self-pay | Admitting: Internal Medicine

## 2023-04-11 DIAGNOSIS — E118 Type 2 diabetes mellitus with unspecified complications: Secondary | ICD-10-CM

## 2023-04-14 ENCOUNTER — Other Ambulatory Visit: Payer: Self-pay | Admitting: Internal Medicine

## 2023-04-14 DIAGNOSIS — E118 Type 2 diabetes mellitus with unspecified complications: Secondary | ICD-10-CM

## 2023-04-21 ENCOUNTER — Other Ambulatory Visit: Payer: Self-pay | Admitting: Internal Medicine

## 2023-04-21 DIAGNOSIS — I10 Essential (primary) hypertension: Secondary | ICD-10-CM

## 2023-04-23 ENCOUNTER — Other Ambulatory Visit: Payer: Self-pay | Admitting: Internal Medicine

## 2023-04-23 DIAGNOSIS — F5104 Psychophysiologic insomnia: Secondary | ICD-10-CM

## 2023-05-06 ENCOUNTER — Encounter: Payer: Self-pay | Admitting: Internal Medicine

## 2023-05-06 ENCOUNTER — Ambulatory Visit: Payer: Managed Care, Other (non HMO) | Admitting: Internal Medicine

## 2023-05-06 ENCOUNTER — Ambulatory Visit (INDEPENDENT_AMBULATORY_CARE_PROVIDER_SITE_OTHER): Payer: Managed Care, Other (non HMO)

## 2023-05-06 VITALS — BP 138/88 | HR 66 | Temp 98.0°F | Resp 16 | Ht 68.0 in | Wt 221.6 lb

## 2023-05-06 DIAGNOSIS — Z7722 Contact with and (suspected) exposure to environmental tobacco smoke (acute) (chronic): Secondary | ICD-10-CM

## 2023-05-06 DIAGNOSIS — E785 Hyperlipidemia, unspecified: Secondary | ICD-10-CM | POA: Diagnosis not present

## 2023-05-06 DIAGNOSIS — E118 Type 2 diabetes mellitus with unspecified complications: Secondary | ICD-10-CM

## 2023-05-06 DIAGNOSIS — Z23 Encounter for immunization: Secondary | ICD-10-CM | POA: Diagnosis not present

## 2023-05-06 DIAGNOSIS — I10 Essential (primary) hypertension: Secondary | ICD-10-CM | POA: Diagnosis not present

## 2023-05-06 DIAGNOSIS — Z7984 Long term (current) use of oral hypoglycemic drugs: Secondary | ICD-10-CM

## 2023-05-06 DIAGNOSIS — K219 Gastro-esophageal reflux disease without esophagitis: Secondary | ICD-10-CM

## 2023-05-06 LAB — BASIC METABOLIC PANEL
BUN: 21 mg/dL (ref 6–23)
CO2: 31 meq/L (ref 19–32)
Calcium: 9.7 mg/dL (ref 8.4–10.5)
Chloride: 101 meq/L (ref 96–112)
Creatinine, Ser: 1.27 mg/dL (ref 0.40–1.50)
GFR: 61.12 mL/min (ref >60.00)
Glucose, Bld: 126 mg/dL — ABNORMAL HIGH (ref 70–99)
Potassium: 4.2 meq/L (ref 3.5–5.1)
Sodium: 139 meq/L (ref 135–145)

## 2023-05-06 LAB — LIPID PANEL
Cholesterol: 110 mg/dL (ref 0–200)
HDL: 46.3 mg/dL (ref >39.00)
LDL Cholesterol: 47 mg/dL (ref 0–99)
NonHDL: 64.15
Total CHOL/HDL Ratio: 2
Triglycerides: 88 mg/dL (ref 0.0–149.0)
VLDL: 17.6 mg/dL (ref 0.0–40.0)

## 2023-05-06 LAB — HEPATIC FUNCTION PANEL
ALT: 25 U/L (ref 0–53)
AST: 29 U/L (ref 0–37)
Albumin: 4.7 g/dL (ref 3.5–5.2)
Alkaline Phosphatase: 42 U/L (ref 39–117)
Bilirubin, Direct: 0.2 mg/dL (ref 0.0–0.3)
Total Bilirubin: 0.7 mg/dL (ref 0.2–1.2)
Total Protein: 7.5 g/dL (ref 6.0–8.3)

## 2023-05-06 LAB — URINALYSIS, ROUTINE W REFLEX MICROSCOPIC
Bilirubin Urine: NEGATIVE
Hgb urine dipstick: NEGATIVE
Ketones, ur: NEGATIVE
Leukocytes,Ua: NEGATIVE
Nitrite: NEGATIVE
RBC / HPF: NONE SEEN (ref 0–?)
Specific Gravity, Urine: 1.015 (ref 1.000–1.030)
Total Protein, Urine: NEGATIVE
Urine Glucose: NEGATIVE
Urobilinogen, UA: 0.2 (ref 0.0–1.0)
WBC, UA: NONE SEEN (ref 0–?)
pH: 6.5 (ref 5.0–8.0)

## 2023-05-06 LAB — CBC WITH DIFFERENTIAL/PLATELET
Basophils Absolute: 0.1 10*3/uL (ref 0.0–0.1)
Basophils Relative: 1.5 % (ref 0.0–3.0)
Eosinophils Absolute: 0.1 10*3/uL (ref 0.0–0.7)
Eosinophils Relative: 1.8 % (ref 0.0–5.0)
HCT: 44.5 % (ref 39.0–52.0)
Hemoglobin: 15.4 g/dL (ref 13.0–17.0)
Lymphocytes Relative: 25.6 % (ref 12.0–46.0)
Lymphs Abs: 1.6 10*3/uL (ref 0.7–4.0)
MCHC: 34.5 g/dL (ref 30.0–36.0)
MCV: 95.7 fL (ref 78.0–100.0)
Monocytes Absolute: 0.5 10*3/uL (ref 0.1–1.0)
Monocytes Relative: 7.8 % (ref 3.0–12.0)
Neutro Abs: 3.9 10*3/uL (ref 1.4–7.7)
Neutrophils Relative %: 63.3 % (ref 43.0–77.0)
Platelets: 212 10*3/uL (ref 150.0–400.0)
RBC: 4.66 Mil/uL (ref 4.22–5.81)
RDW: 12.7 % (ref 11.5–15.5)
WBC: 6.2 10*3/uL (ref 4.0–10.5)

## 2023-05-06 LAB — HEMOGLOBIN A1C: Hgb A1c MFr Bld: 6.8 % — ABNORMAL HIGH (ref 4.6–6.5)

## 2023-05-06 NOTE — Progress Notes (Signed)
Subjective:  Patient ID: Devin Roberts, male    DOB: 05-30-1960  Age: 63 y.o. MRN: [id number]  CC: Hypertension, Hyperlipidemia, and Diabetes   HPI Yehuda Savannah presents for f/up ----  Discussed the use of AI scribe software for clinical note transcription with the patient, who gave verbal consent to proceed.  History of Present Illness   Devin Roberts is a 63 year old male who presents for a follow-up regarding blood sugar management and general health maintenance. He is accompanied by Bradly Chris, who is concerned about his health.  He has experienced fluctuations in blood sugar levels, particularly noting higher readings in the mornings during December when he was less active and not following an optimal diet. Morning blood sugar levels were around 140-150 mg/dL, prompting the initiation of metformin. Blood sugar levels have been dropping steadily, with a recent reading of 104 mg/dL. No symptoms of hyperglycemia such as excessive thirst, excessive urination, or drastic changes in weight or appetite. No symptoms of hypotension such as dizziness or lightheadedness.  He occasionally experiences soreness below his rib, similar to muscle soreness from exercise, which comes and goes without a consistent pattern.  No chest pain, shortness of breath, dizziness, lightheadedness, excessive thirst, excessive urination, drastic changes in weight or appetite, headache, blurred vision, and cough.       Outpatient Medications Prior to Visit  Medication Sig Dispense Refill   acetaminophen (TYLENOL) 500 MG tablet Take 1-2 tablets (500-1,000 mg total) by mouth every 6 (six) hours as needed for moderate pain. 30 tablet 0   docusate sodium (COLACE) 100 MG capsule TAKE ONE CAPSULE BY MOUTH TWICE DAILY (Patient taking differently: Take 100 mg by mouth daily as needed for mild constipation.) 60 capsule 0   EDARBYCLOR 40-25 MG TABS TAKE ONE TABLET BY MOUTH DAILY 90 tablet 0   ezetimibe (ZETIA) 10 MG tablet  Take 1 tablet (10 mg total) by mouth daily. 90 tablet 0   fexofenadine (ALLEGRA) 180 MG tablet Take 180 mg by mouth daily as needed for allergies or rhinitis.     guaiFENesin (MUCINEX) 600 MG 12 hr tablet Take 600 mg by mouth 2 (two) times daily as needed for cough or to loosen phlegm.     icosapent Ethyl (VASCEPA) 1 g capsule TAKE TWO CAPSULES BY MOUTH TWICE DAILY 360 capsule 0   meloxicam (MOBIC) 15 MG tablet Take 15 mg by mouth as needed for pain.     metFORMIN (GLUCOPHAGE-XR) 750 MG 24 hr tablet Take 750 mg by mouth daily with breakfast.     methocarbamol (ROBAXIN) 750 MG tablet Take 750 mg by mouth as needed for muscle spasms.     Multiple Vitamin (MULTIVITAMIN WITH MINERALS) TABS tablet Take 1 tablet by mouth daily.     OVER THE COUNTER MEDICATION Take by mouth daily in the afternoon. Balance of nature fruits and veggies. ( 2 tablets daily )     OVER THE COUNTER MEDICATION daily. Total Beets     pravastatin (PRAVACHOL) 40 MG tablet TAKE ONE TABLET BY MOUTH AT BEDTIME 90 tablet 0   zolpidem (AMBIEN) 10 MG tablet Take 1 tablet (10 mg total) by mouth at bedtime. 90 tablet 0   PARoxetine (PAXIL) 20 MG tablet Take 1 tablet (20 mg total) by mouth at bedtime. 30 tablet 0   tiZANidine (ZANAFLEX) 2 MG tablet Take 1 tablet (2 mg total) by mouth every 8 (eight) hours as needed for muscle spasms. 270 tablet 1   No facility-administered  medications prior to visit.    ROS Review of Systems  Gastrointestinal:  Positive for abdominal pain.  Genitourinary:  Positive for flank pain.    Objective:  BP 138/88 (BP Location: Left Arm, Patient Position: Sitting, Cuff Size: Normal)   Pulse 66   Temp 98 F (36.7 C) (Oral)   Resp 16   Ht 5\' 8"  (1.727 m)   Wt 221 lb 9.6 oz (100.5 kg)   SpO2 92%   BMI 33.69 kg/m   BP Readings from Last 3 Encounters:  05/06/23 138/88  10/03/22 138/86  09/25/22 115/75    Wt Readings from Last 3 Encounters:  05/06/23 221 lb 9.6 oz (100.5 kg)  10/03/22 216 lb (98  kg)  09/25/22 219 lb (99.3 kg)    Physical Exam Cardiovascular:     Rate and Rhythm: Normal rate and regular rhythm.     Heart sounds: Normal heart sounds, S1 normal and S2 normal.     Comments: EKG- NSR, 64 bpm Upsloping ST elevation - unchanged No LVH or Q waves Musculoskeletal:     Right lower leg: No edema.     Left lower leg: No edema.     Lab Results  Component Value Date   WBC 6.2 05/06/2023   HGB 15.4 05/06/2023   HCT 44.5 05/06/2023   PLT 212.0 05/06/2023   GLUCOSE 126 (H) 05/06/2023   CHOL 110 05/06/2023   TRIG 88.0 05/06/2023   HDL 46.30 05/06/2023   LDLDIRECT 66.0 11/16/2018   LDLCALC 47 05/06/2023   ALT 25 05/06/2023   AST 29 05/06/2023   NA 139 05/06/2023   K 4.2 05/06/2023   CL 101 05/06/2023   CREATININE 1.27 05/06/2023   BUN 21 05/06/2023   CO2 31 05/06/2023   TSH 0.68 10/03/2022   PSA 2.16 10/03/2022   INR 0.9 11/20/2021   HGBA1C 6.8 (H) 05/06/2023   MICROALBUR 0.7 10/03/2022    CT CARDIAC SCORING ([facility] LOCATIONS ONLY) Result Date: 10/29/2022 CLINICAL DATA:  63 year old white male * Tracking Code: FCC * EXAM: CT CARDIAC CORONARY ARTERY CALCIUM SCORE TECHNIQUE: Non-contrast imaging through the heart was performed using prospective ECG gating. Image post processing was performed on an independent workstation, allowing for quantitative analysis of the heart and coronary arteries. Note that this exam targets the heart and the chest was not imaged in its entirety. COMPARISON:  Chest CT dated May 25, 2021 FINDINGS: CORONARY CALCIUM SCORES: Left Main: 0 LAD: 11.9 LCx: 0 RCA: 0 Total Agatston Score: 11.9 MESA database percentile: 41 AORTA MEASUREMENTS: Ascending Aorta: 3.2 cm Descending Aorta:2.4 cm OTHER FINDINGS: Vascular: Normal heart size. No pericardial effusion. Normal caliber thoracic aorta with mild atherosclerotic disease. Mediastinum/Nodes: Esophagus is unremarkable. No pathologically enlarged lymph nodes seen in the chest. Lungs/Pleura: Central  airways are patent. No consolidation, pleural effusion or pneumothorax. Stable small solid pulmonary nodule of the right upper lobe measuring 3 mm on series 9, image 16, no follow-up imaging is necessary given greater than 1 year stability. Upper Abdomen: No acute abnormality. Musculoskeletal: No chest wall mass or suspicious bone lesions identified. IMPRESSION: 1. Total calcium score of 11.9 is at percentile 41 for subjects of the same age, gender, and race/ethnicity. 2. Mild aortic Atherosclerosis (ICD10-I70.0). Electronically Signed   By: Allegra Lai M.D.   On: 10/29/2022 20:41   No results found.   Assessment & Plan:   Hyperlipidemia with target LDL less than 100- LDL goal achieved. Doing well on the statin  -  Lipid panel; Future -     Hepatic function panel; Future  Essential hypertension- BP is well controlled. -     Basic metabolic panel; Future -     CBC with Differential/Platelet; Future -     EKG 12-Lead -     Urinalysis, Routine w reflex microscopic; Future  Type II diabetes mellitus with manifestations (HCC)- Blood sugar is well controlled. -     Basic metabolic panel; Future -     Hemoglobin A1c; Future -     Urinalysis, Routine w reflex microscopic; Future  Gastroesophageal reflux disease without esophagitis -     CBC with Differential/Platelet; Future -     DG Chest 2 View; Future  Immunization due -     Varicella-zoster vaccine IM  Exposure to second hand smoke -     DG Chest 2 View; Future     Follow-up: Return in about 6 months (around 11/03/2023).  Sanda Linger, MD

## 2023-05-06 NOTE — Patient Instructions (Signed)
 Hypertension, Adult High blood pressure (hypertension) is when the force of blood pumping through the arteries is too strong. The arteries are the blood vessels that carry blood from the heart throughout the body. Hypertension forces the heart to work harder to pump blood and may cause arteries to become narrow or stiff. Untreated or uncontrolled hypertension can lead to a heart attack, heart failure, a stroke, kidney disease, and other problems. A blood pressure reading consists of a higher number over a lower number. Ideally, your blood pressure should be below 120/80. The first ("top") number is called the systolic pressure. It is a measure of the pressure in your arteries as your heart beats. The second ("bottom") number is called the diastolic pressure. It is a measure of the pressure in your arteries as the heart relaxes. What are the causes? The exact cause of this condition is not known. There are some conditions that result in high blood pressure. What increases the risk? Certain factors may make you more likely to develop high blood pressure. Some of these risk factors are under your control, including: Smoking. Not getting enough exercise or physical activity. Being overweight. Having too much fat, sugar, calories, or salt (sodium) in your diet. Drinking too much alcohol. Other risk factors include: Having a personal history of heart disease, diabetes, high cholesterol, or kidney disease. Stress. Having a family history of high blood pressure and high cholesterol. Having obstructive sleep apnea. Age. The risk increases with age. What are the signs or symptoms? High blood pressure may not cause symptoms. Very high blood pressure (hypertensive crisis) may cause: Headache. Fast or irregular heartbeats (palpitations). Shortness of breath. Nosebleed. Nausea and vomiting. Vision changes. Severe chest pain, dizziness, and seizures. How is this diagnosed? This condition is diagnosed by  measuring your blood pressure while you are seated, with your arm resting on a flat surface, your legs uncrossed, and your feet flat on the floor. The cuff of the blood pressure monitor will be placed directly against the skin of your upper arm at the level of your heart. Blood pressure should be measured at least twice using the same arm. Certain conditions can cause a difference in blood pressure between your right and left arms. If you have a high blood pressure reading during one visit or you have normal blood pressure with other risk factors, you may be asked to: Return on a different day to have your blood pressure checked again. Monitor your blood pressure at home for 1 week or longer. If you are diagnosed with hypertension, you may have other blood or imaging tests to help your health care provider understand your overall risk for other conditions. How is this treated? This condition is treated by making healthy lifestyle changes, such as eating healthy foods, exercising more, and reducing your alcohol intake. You may be referred for counseling on a healthy diet and physical activity. Your health care provider may prescribe medicine if lifestyle changes are not enough to get your blood pressure under control and if: Your systolic blood pressure is above 130. Your diastolic blood pressure is above 80. Your personal target blood pressure may vary depending on your medical conditions, your age, and other factors. Follow these instructions at home: Eating and drinking  Eat a diet that is high in fiber and potassium, and low in sodium, added sugar, and fat. An example of this eating plan is called the DASH diet. DASH stands for Dietary Approaches to Stop Hypertension. To eat this way: Eat  plenty of fresh fruits and vegetables. Try to fill one half of your plate at each meal with fruits and vegetables. Eat whole grains, such as whole-wheat pasta, brown rice, or whole-grain bread. Fill about one  fourth of your plate with whole grains. Eat or drink low-fat dairy products, such as skim milk or low-fat yogurt. Avoid fatty cuts of meat, processed or cured meats, and poultry with skin. Fill about one fourth of your plate with lean proteins, such as fish, chicken without skin, beans, eggs, or tofu. Avoid pre-made and processed foods. These tend to be higher in sodium, added sugar, and fat. Reduce your daily sodium intake. Many people with hypertension should eat less than 1,500 mg of sodium a day. Do not drink alcohol if: Your health care provider tells you not to drink. You are pregnant, may be pregnant, or are planning to become pregnant. If you drink alcohol: Limit how much you have to: 0-1 drink a day for women. 0-2 drinks a day for men. Know how much alcohol is in your drink. In the [place] one drink equals one 12 oz bottle of beer (355 mL), one 5 oz glass of wine (148 mL), or one 1 oz glass of hard liquor (44 mL). Lifestyle  Work with your health care provider to maintain a healthy body weight or to lose weight. Ask what an ideal weight is for you. Get at least 30 minutes of exercise that causes your heart to beat faster (aerobic exercise) most days of the week. Activities may include walking, swimming, or biking. Include exercise to strengthen your muscles (resistance exercise), such as Pilates or lifting weights, as part of your weekly exercise routine. Try to do these types of exercises for 30 minutes at least 3 days a week. Do not use any products that contain nicotine or tobacco. These products include cigarettes, chewing tobacco, and vaping devices, such as e-cigarettes. If you need help quitting, ask your health care provider. Monitor your blood pressure at home as told by your health care provider. Keep all follow-up visits. This is important. Medicines Take over-the-counter and prescription medicines only as told by your health care provider. Follow directions carefully. Blood  pressure medicines must be taken as prescribed. Do not skip doses of blood pressure medicine. Doing this puts you at risk for problems and can make the medicine less effective. Ask your health care provider about side effects or reactions to medicines that you should watch for. Contact a health care provider if you: Think you are having a reaction to a medicine you are taking. Have headaches that keep coming back (recurring). Feel dizzy. Have swelling in your ankles. Have trouble with your vision. Get help right away if you: Develop a severe headache or confusion. Have unusual weakness or numbness. Feel faint. Have severe pain in your chest or abdomen. Vomit repeatedly. Have trouble breathing. These symptoms may be an emergency. Get help right away. Call 911. Do not wait to see if the symptoms will go away. Do not drive yourself to the hospital. Summary Hypertension is when the force of blood pumping through your arteries is too strong. If this condition is not controlled, it may put you at risk for serious complications. Your personal target blood pressure may vary depending on your medical conditions, your age, and other factors. For most people, a normal blood pressure is less than 120/80. Hypertension is treated with lifestyle changes, medicines, or a combination of both. Lifestyle changes include losing weight, eating a healthy,  low-sodium diet, exercising more, and limiting alcohol. This information is not intended to replace advice given to you by your health care provider. Make sure you discuss any questions you have with your health care provider. Document Revised: 01/16/2021 Document Reviewed: 01/16/2021 Elsevier Patient Education  2024 [employer].

## 2023-05-11 ENCOUNTER — Encounter: Payer: Self-pay | Admitting: Internal Medicine

## 2023-05-12 ENCOUNTER — Other Ambulatory Visit: Payer: Self-pay | Admitting: Internal Medicine

## 2023-05-12 DIAGNOSIS — E785 Hyperlipidemia, unspecified: Secondary | ICD-10-CM

## 2023-05-12 MED ORDER — PRAVASTATIN SODIUM 40 MG PO TABS: 40.0000 mg | ORAL_TABLET | Freq: Every day | ORAL | 1 refills | Status: DC

## 2023-05-13 ENCOUNTER — Encounter: Payer: Self-pay | Admitting: Internal Medicine

## 2023-05-18 ENCOUNTER — Encounter: Payer: Self-pay | Admitting: Internal Medicine

## 2023-06-16 ENCOUNTER — Other Ambulatory Visit: Payer: Self-pay | Admitting: Internal Medicine

## 2023-06-16 DIAGNOSIS — E118 Type 2 diabetes mellitus with unspecified complications: Secondary | ICD-10-CM

## 2023-07-08 ENCOUNTER — Other Ambulatory Visit: Payer: Self-pay | Admitting: Internal Medicine

## 2023-07-08 DIAGNOSIS — E785 Hyperlipidemia, unspecified: Secondary | ICD-10-CM

## 2023-07-10 ENCOUNTER — Other Ambulatory Visit: Payer: Self-pay | Admitting: Internal Medicine

## 2023-07-10 DIAGNOSIS — E785 Hyperlipidemia, unspecified: Secondary | ICD-10-CM

## 2023-07-10 NOTE — Telephone Encounter (Signed)
 Copied from CRM [id number]. Topic: Clinical - Medication Refill >> Jul 10, 2023 12:03 PM Marlan Silva wrote: Most Recent Primary Care Visit:  Provider: Arcadio Knuckles  Department: [facility]  Visit Type: OFFICE VISIT  Date: 05/06/2023  Medication: ezetimibe (ZETIA) 10 MG tablet  Has the patient contacted their pharmacy? Yes (Agent: If no, request that the patient contact the pharmacy for the refill. If patient does not wish to contact the pharmacy document the reason why and proceed with request.) (Agent: If yes, when and what did the pharmacy advise?)  Is this the correct pharmacy for this prescription? Yes If no, delete pharmacy and type the correct one.  This is the patient's preferred pharmacy:  [facility] [place], [place] - [address] [place] [address] Phone: [phone] Fax: [phone]  [facility] [address] [place] [address] Phone: [phone] Fax: [phone]   Has the prescription been filled recently? No  Is the patient out of the medication? Yes  Has the patient been seen for an appointment in the last year OR does the patient have an upcoming appointment? Yes  Can we respond through MyChart? Yes  Agent: Please be advised that Rx refills may take up to 3 business days. We ask that you follow-up with your pharmacy.

## 2023-07-15 ENCOUNTER — Other Ambulatory Visit: Payer: Self-pay | Admitting: Internal Medicine

## 2023-07-15 DIAGNOSIS — E785 Hyperlipidemia, unspecified: Secondary | ICD-10-CM

## 2023-07-16 MED ORDER — EZETIMIBE 10 MG PO TABS: 10.0000 mg | ORAL_TABLET | Freq: Every day | ORAL | 0 refills | Status: DC

## 2023-07-18 ENCOUNTER — Other Ambulatory Visit: Payer: Self-pay | Admitting: Family

## 2023-07-18 DIAGNOSIS — F5104 Psychophysiologic insomnia: Secondary | ICD-10-CM

## 2023-07-25 ENCOUNTER — Other Ambulatory Visit: Payer: Self-pay | Admitting: Family

## 2023-07-25 DIAGNOSIS — I10 Essential (primary) hypertension: Secondary | ICD-10-CM

## 2023-07-28 ENCOUNTER — Other Ambulatory Visit: Payer: Self-pay | Admitting: Internal Medicine

## 2023-07-28 DIAGNOSIS — F5104 Psychophysiologic insomnia: Secondary | ICD-10-CM

## 2023-07-28 NOTE — Telephone Encounter (Signed)
 Copied from CRM [id number]. Topic: Clinical - Medication Refill >> Jul 28, 2023  5:54 PM Alyse July wrote: Most Recent Primary Care Visit:  Provider: Arcadio Knuckles  Department: [facility] [place]  Visit Type: OFFICE VISIT  Date: 05/06/2023  Medication: zolpidem  (AMBIEN ) 10 MG tablet  Has the patient contacted their pharmacy? Yes (Agent: If no, request that the patient contact the pharmacy for the refill. If patient does not wish to contact the pharmacy document the reason why and proceed with request.) (Agent: If yes, when and what did the pharmacy advise?)  Is this the correct pharmacy for this prescription? Yes If no, delete pharmacy and type the correct one.  This is the patient's preferred pharmacy:  [facility] [place], [place] - [address] [place] [address] Phone: [phone] Fax: [phone]  [facility] [address] [place] [address] Phone: [phone] Fax: [phone]   Has the prescription been filled recently? Yes  Is the patient out of the medication? No  Has the patient been seen for an appointment in the last year OR does the patient have an upcoming appointment? Yes  Can we respond through MyChart? No  Agent: Please be advised that Rx refills may take up to 3 business days. We ask that you follow-up with your pharmacy.

## 2023-07-29 MED ORDER — ZOLPIDEM TARTRATE 10 MG PO TABS: 10.0000 mg | ORAL_TABLET | Freq: Every day | ORAL | 0 refills | Status: DC

## 2023-07-30 ENCOUNTER — Other Ambulatory Visit: Payer: Self-pay | Admitting: Internal Medicine

## 2023-07-30 DIAGNOSIS — I10 Essential (primary) hypertension: Secondary | ICD-10-CM

## 2023-07-30 NOTE — Telephone Encounter (Unsigned)
 Copied from CRM [id number]. Topic: Clinical - Medication Refill >> Jul 30, 2023  9:16 AM Deaijah H wrote: Medication: EDARBYCLOR  40-25 MG TABS  Has the patient contacted their pharmacy? Yes (Agent: If no, request that the patient contact the pharmacy for the refill. If patient does not wish to contact the pharmacy document the reason why and proceed with request.) (Agent: If yes, when and what did the pharmacy advise?) That they sent the request over multiple times  This is the patient's preferred pharmacy:  [facility] [place], [place] - [address] [place] [address] Phone: [phone] Fax: [phone]   Is this the correct pharmacy for this prescription? Yes If no, delete pharmacy and type the correct one.   Has the prescription been filled recently? Yes  Is the patient out of the medication? Yes  Has the patient been seen for an appointment in the last year OR does the patient have an upcoming appointment? Yes  Can we respond through MyChart? Yes  Agent: Please be advised that Rx refills may take up to 3 business days. We ask that you follow-up with your pharmacy.

## 2023-08-01 ENCOUNTER — Telehealth: Payer: Self-pay | Admitting: Internal Medicine

## 2023-08-01 ENCOUNTER — Other Ambulatory Visit: Payer: Self-pay

## 2023-08-01 DIAGNOSIS — I10 Essential (primary) hypertension: Secondary | ICD-10-CM

## 2023-08-01 MED ORDER — EDARBYCLOR 40-25 MG PO TABS: 1.0000 | ORAL_TABLET | Freq: Every day | ORAL | 1 refills | Status: DC

## 2023-08-01 NOTE — Telephone Encounter (Signed)
 Caller & Relationship to patient: Wife  Call back number: [phone]   Date of last office visit: 2.11.25  Date of next office visit: 8.14.25  Medication(s) to be refilled:  EDARBYCLOR  40-25 MG TABS   Preferred Pharmacy:   [facility]   Phone: [phone]  Fax: [phone]   Pt's spouse states they have been calling to get this rx refilled all week, but it appears no messages were sent to let us  know.   Forwarding to DOD

## 2023-08-01 NOTE — Telephone Encounter (Signed)
 Medication has been refilled.

## 2023-08-06 ENCOUNTER — Other Ambulatory Visit: Payer: Self-pay | Admitting: Internal Medicine

## 2023-08-06 DIAGNOSIS — E785 Hyperlipidemia, unspecified: Secondary | ICD-10-CM

## 2023-09-08 ENCOUNTER — Other Ambulatory Visit: Payer: Self-pay | Admitting: Internal Medicine

## 2023-10-07 ENCOUNTER — Other Ambulatory Visit: Payer: Self-pay | Admitting: Internal Medicine

## 2023-10-07 DIAGNOSIS — E118 Type 2 diabetes mellitus with unspecified complications: Secondary | ICD-10-CM

## 2023-10-08 ENCOUNTER — Other Ambulatory Visit: Payer: Self-pay | Admitting: Internal Medicine

## 2023-10-08 DIAGNOSIS — E785 Hyperlipidemia, unspecified: Secondary | ICD-10-CM

## 2023-10-10 ENCOUNTER — Telehealth: Payer: Self-pay

## 2023-10-10 ENCOUNTER — Other Ambulatory Visit (HOSPITAL_COMMUNITY): Payer: Self-pay

## 2023-10-10 NOTE — Telephone Encounter (Signed)
 Pharmacy Patient Advocate Encounter   Received notification from CoverMyMeds that prior authorization for Vascepa  1GM capsules is required/requested.   Insurance verification completed.   The patient is insured through [facility] .   Per test claim: PA required; PA submitted to above mentioned insurance via CoverMyMeds Key/confirmation #/[facility] Status is pending

## 2023-10-13 ENCOUNTER — Telehealth: Payer: Self-pay

## 2023-10-13 NOTE — Telephone Encounter (Unsigned)
 Copied from CRM [id number]. Topic: Clinical - Prescription Issue >> Oct 13, 2023  3:35 PM Antonio H wrote: Reason for CRM: Patient's wife is calling in regards to refill for ezetimibe  (ZETIA ) 10 MG tablet, there's a pending refill request from 7/16 but medication has not been filled yet. Pharmacy is the [facility]- [address] [place] [address]

## 2023-10-14 ENCOUNTER — Other Ambulatory Visit (HOSPITAL_COMMUNITY): Payer: Self-pay

## 2023-10-17 NOTE — Telephone Encounter (Signed)
 Medication has been refilled.

## 2023-10-20 NOTE — Telephone Encounter (Signed)
 Prior auth canceled because medication was discontinued.

## 2023-11-04 ENCOUNTER — Ambulatory Visit: Payer: Managed Care, Other (non HMO) | Admitting: Internal Medicine

## 2023-11-06 ENCOUNTER — Ambulatory Visit: Payer: Managed Care, Other (non HMO) | Admitting: Internal Medicine

## 2023-11-12 ENCOUNTER — Encounter: Payer: Self-pay | Admitting: Internal Medicine

## 2023-11-12 ENCOUNTER — Ambulatory Visit: Admitting: Internal Medicine

## 2023-11-12 VITALS — BP 132/88 | HR 69 | Temp 98.0°F | Ht 68.0 in | Wt 225.0 lb

## 2023-11-12 DIAGNOSIS — Z7984 Long term (current) use of oral hypoglycemic drugs: Secondary | ICD-10-CM | POA: Diagnosis not present

## 2023-11-12 DIAGNOSIS — I10 Essential (primary) hypertension: Secondary | ICD-10-CM

## 2023-11-12 DIAGNOSIS — E118 Type 2 diabetes mellitus with unspecified complications: Secondary | ICD-10-CM | POA: Diagnosis not present

## 2023-11-12 DIAGNOSIS — Z0000 Encounter for general adult medical examination without abnormal findings: Secondary | ICD-10-CM | POA: Diagnosis not present

## 2023-11-12 DIAGNOSIS — Z0001 Encounter for general adult medical examination with abnormal findings: Secondary | ICD-10-CM | POA: Insufficient documentation

## 2023-11-12 LAB — URINALYSIS, ROUTINE W REFLEX MICROSCOPIC
Bilirubin Urine: NEGATIVE
Hgb urine dipstick: NEGATIVE
Ketones, ur: NEGATIVE
Leukocytes,Ua: NEGATIVE
Nitrite: NEGATIVE
RBC / HPF: NONE SEEN (ref 0–?)
Specific Gravity, Urine: 1.01 (ref 1.000–1.030)
Total Protein, Urine: NEGATIVE
Urine Glucose: NEGATIVE
Urobilinogen, UA: 0.2 (ref 0.0–1.0)
pH: 7 (ref 5.0–8.0)

## 2023-11-12 LAB — CBC WITH DIFFERENTIAL/PLATELET
Basophils Absolute: 0 K/uL (ref 0.0–0.1)
Basophils Relative: 0.8 % (ref 0.0–3.0)
Eosinophils Absolute: 0.1 K/uL (ref 0.0–0.7)
Eosinophils Relative: 2 % (ref 0.0–5.0)
HCT: 42.7 % (ref 39.0–52.0)
Hemoglobin: 14.7 g/dL (ref 13.0–17.0)
Lymphocytes Relative: 23.9 % (ref 12.0–46.0)
Lymphs Abs: 1.4 K/uL (ref 0.7–4.0)
MCHC: 34.5 g/dL (ref 30.0–36.0)
MCV: 94.6 fl (ref 78.0–100.0)
Monocytes Absolute: 0.5 K/uL (ref 0.1–1.0)
Monocytes Relative: 8.9 % (ref 3.0–12.0)
Neutro Abs: 3.7 K/uL (ref 1.4–7.7)
Neutrophils Relative %: 64.4 % (ref 43.0–77.0)
Platelets: 184 K/uL (ref 150.0–400.0)
RBC: 4.51 Mil/uL (ref 4.22–5.81)
RDW: 13.3 % (ref 11.5–15.5)
WBC: 5.7 K/uL (ref 4.0–10.5)

## 2023-11-12 LAB — MICROALBUMIN / CREATININE URINE RATIO
Creatinine,U: 127.8 mg/dL
Microalb Creat Ratio: 5.5 mg/g (ref 0.0–30.0)
Microalb, Ur: 0.7 mg/dL (ref 0.0–1.9)

## 2023-11-12 LAB — BASIC METABOLIC PANEL WITH GFR
BUN: 22 mg/dL (ref 6–23)
CO2: 30 meq/L (ref 19–32)
Calcium: 9.1 mg/dL (ref 8.4–10.5)
Chloride: 98 meq/L (ref 96–112)
Creatinine, Ser: 1.19 mg/dL (ref 0.40–1.50)
GFR: 65.84 mL/min (ref >60.00)
Glucose, Bld: 143 mg/dL — ABNORMAL HIGH (ref 70–99)
Potassium: 3.9 meq/L (ref 3.5–5.1)
Sodium: 137 meq/L (ref 135–145)

## 2023-11-12 LAB — HEMOGLOBIN A1C: Hgb A1c MFr Bld: 6.8 % — ABNORMAL HIGH (ref 4.6–6.5)

## 2023-11-12 LAB — PSA: PSA: 2.11 ng/mL (ref 0.10–4.00)

## 2023-11-12 MED ORDER — METFORMIN HCL ER 750 MG PO TB24: 750.0000 mg | ORAL_TABLET | Freq: Every day | ORAL | 1 refills | Status: AC

## 2023-11-12 NOTE — Progress Notes (Signed)
 Subjective:  Patient ID: Devin Roberts, male    DOB: 1960-07-02  Age: 63 y.o. MRN: [id number]  CC: Annual Exam, Hypertension, Diabetes, and Hyperlipidemia   HPI Amair L Chirico presents for a CPX and f/up -----  Discussed the use of AI scribe software for clinical note transcription with the patient, who gave verbal consent to proceed.  History of Present Illness Devin Roberts is a 63 year old male who presents with chronic back pain related to a previous accident.  He experiences chronic back pain primarily in the area where his back was previously broken, described as being in the 'tubula' area. He does not take specific medication for pain but uses a muscle relaxer, meloxicam , particularly when engaging in activities in confined spaces at work.  He maintains an active lifestyle, exercising four days a week with a focus on repetitions rather than heavy weights. He feels better when active compared to when he was less active and in pain. No chest pain or shortness of breath during physical activity, although he does breathe hard after some repetitions.  He sleeps well without issues of snoring or sleep apnea. No heartburn, indigestion, chronic cough, blood pressure or blood sugar symptoms, excessive thirst, excessive urination, dizziness, or lightheadedness.    Outpatient Medications Prior to Visit  Medication Sig Dispense Refill   acetaminophen  (TYLENOL ) 500 MG tablet Take 1-2 tablets (500-1,000 mg total) by mouth every 6 (six) hours as needed for moderate pain. 30 tablet 0   Azilsartan-Chlorthalidone  (EDARBYCLOR ) 40-25 MG TABS Take 1 tablet by mouth daily. 90 tablet 1   docusate sodium  (COLACE) 100 MG capsule TAKE ONE CAPSULE BY MOUTH TWICE DAILY 60 capsule 0   ezetimibe  (ZETIA ) 10 MG tablet Take 1 tablet (10 mg total) by mouth daily. 90 tablet 0   fexofenadine (ALLEGRA) 180 MG tablet Take 180 mg by mouth daily as needed for allergies or rhinitis.     meloxicam  (MOBIC ) 15 MG  tablet Take 15 mg by mouth as needed for pain.     Multiple Vitamin (MULTIVITAMIN WITH MINERALS) TABS tablet Take 1 tablet by mouth daily.     OVER THE COUNTER MEDICATION Take by mouth daily in the afternoon. Balance of nature fruits and veggies. ( 2 tablets daily )     OVER THE COUNTER MEDICATION daily. Total Beets     pravastatin  (PRAVACHOL ) 40 MG tablet Take 1 tablet (40 mg total) by mouth at bedtime. 90 tablet 1   VASCEPA  1 g capsule TAKE TWO CAPSULES BY MOUTH TWICE DAILY 360 capsule 0   zolpidem  (AMBIEN ) 10 MG tablet Take 1 tablet (10 mg total) by mouth at bedtime. 90 tablet 0   metFORMIN  (GLUCOPHAGE -XR) 750 MG 24 hr tablet TAKE ONE TABLET BY MOUTH DAILY WITH BREAKFAST 90 tablet 0   methocarbamol  (ROBAXIN ) 750 MG tablet Take 750 mg by mouth as needed for muscle spasms.     guaiFENesin  (MUCINEX ) 600 MG 12 hr tablet Take 600 mg by mouth 2 (two) times daily as needed for cough or to loosen phlegm. (Patient not taking: Reported on 11/12/2023)     No facility-administered medications prior to visit.    ROS Review of Systems  Constitutional: Negative.  Negative for appetite change, chills, diaphoresis, fatigue and fever.  HENT: Negative.    Eyes: Negative.   Respiratory:  Negative for apnea, cough, chest tightness, shortness of breath and wheezing.   Cardiovascular:  Negative for chest pain, palpitations and leg swelling.  Gastrointestinal: Negative.  Negative  for abdominal pain, blood in stool, constipation, diarrhea, nausea and vomiting.  Endocrine: Negative.   Genitourinary:  Negative for difficulty urinating, scrotal swelling and testicular pain.  Musculoskeletal: Negative.   Skin: Negative.   Neurological: Negative.  Negative for dizziness and weakness.  Hematological:  Negative for adenopathy. Does not bruise/bleed easily.  Psychiatric/Behavioral: Negative.      Objective:  BP 132/88 (BP Location: Left Arm, Patient Position: Sitting, Cuff Size: Normal)   Pulse 69   Temp 98 F  (36.7 C) (Oral)   Ht 5' 8 (1.727 m)   Wt 225 lb (102.1 kg)   SpO2 96%   BMI 34.21 kg/m   BP Readings from Last 3 Encounters:  11/12/23 132/88  05/06/23 138/88  10/03/22 138/86    Wt Readings from Last 3 Encounters:  11/12/23 225 lb (102.1 kg)  05/06/23 221 lb 9.6 oz (100.5 kg)  10/03/22 216 lb (98 kg)    Physical Exam Vitals reviewed.  Constitutional:      Appearance: Normal appearance.  HENT:     Nose: Nose normal.     Mouth/Throat:     Mouth: Mucous membranes are moist.  Eyes:     General: No scleral icterus.    Conjunctiva/sclera: Conjunctivae normal.  Cardiovascular:     Rate and Rhythm: Normal rate and regular rhythm.     Heart sounds: No murmur heard.    No friction rub. No gallop.  Pulmonary:     Effort: Pulmonary effort is normal.     Breath sounds: No stridor. No wheezing, rhonchi or rales.  Abdominal:     General: Abdomen is flat.     Palpations: There is no mass.     Tenderness: There is no abdominal tenderness. There is no guarding.     Hernia: No hernia is present. There is no hernia in the left inguinal area or right inguinal area.  Genitourinary:    Pubic Area: No rash.      Penis: Normal and uncircumcised.      Testes:        Right: Mass, tenderness, swelling, testicular hydrocele or varicocele not present. Right testis is descended. Cremasteric reflex is present.         Left: Varicocele present. Mass, tenderness, swelling or testicular hydrocele not present. Left testis is descended.     Epididymis:     Right: Normal.     Left: Normal.     Prostate: Normal. Not enlarged, not tender and no nodules present.     Rectum: Guaiac result negative. No mass, tenderness, anal fissure, external hemorrhoid or internal hemorrhoid. Abnormal anal tone.  Musculoskeletal:        General: Normal range of motion.     Cervical back: Neck supple.     Right lower leg: No edema.     Left lower leg: No edema.  Lymphadenopathy:     Cervical: No cervical  adenopathy.     Lower Body: No right inguinal adenopathy. No left inguinal adenopathy.  Skin:    General: Skin is warm and dry.  Neurological:     General: No focal deficit present.     Mental Status: He is alert. Mental status is at baseline.  Psychiatric:        Mood and Affect: Mood normal.        Behavior: Behavior normal.     Lab Results  Component Value Date   WBC 5.7 11/12/2023   HGB 14.7 11/12/2023   HCT 42.7 11/12/2023  PLT 184.0 11/12/2023   GLUCOSE 143 (H) 11/12/2023   CHOL 110 05/06/2023   TRIG 88.0 05/06/2023   HDL 46.30 05/06/2023   LDLDIRECT 66.0 11/16/2018   LDLCALC 47 05/06/2023   ALT 25 05/06/2023   AST 29 05/06/2023   NA 137 11/12/2023   K 3.9 11/12/2023   CL 98 11/12/2023   CREATININE 1.19 11/12/2023   BUN 22 11/12/2023   CO2 30 11/12/2023   TSH 0.68 10/03/2022   PSA 2.11 11/12/2023   INR 0.9 11/20/2021   HGBA1C 6.8 (H) 11/12/2023   MICROALBUR 0.7 11/12/2023    CT CARDIAC SCORING ([facility] LOCATIONS ONLY) Result Date: 10/29/2022 CLINICAL DATA:  63 year old white male * Tracking Code: FCC * EXAM: CT CARDIAC CORONARY ARTERY CALCIUM SCORE TECHNIQUE: Non-contrast imaging through the heart was performed using prospective ECG gating. Image post processing was performed on an independent workstation, allowing for quantitative analysis of the heart and coronary arteries. Note that this exam targets the heart and the chest was not imaged in its entirety. COMPARISON:  Chest CT dated May 25, 2021 FINDINGS: CORONARY CALCIUM SCORES: Left Main: 0 LAD: 11.9 LCx: 0 RCA: 0 Total Agatston Score: 11.9 MESA database percentile: 41 AORTA MEASUREMENTS: Ascending Aorta: 3.2 cm Descending Aorta:2.4 cm OTHER FINDINGS: Vascular: Normal heart size. No pericardial effusion. Normal caliber thoracic aorta with mild atherosclerotic disease. Mediastinum/Nodes: Esophagus is unremarkable. No pathologically enlarged lymph nodes seen in the chest. Lungs/Pleura: Central airways are patent. No  consolidation, pleural effusion or pneumothorax. Stable small solid pulmonary nodule of the right upper lobe measuring 3 mm on series 9, image 16, no follow-up imaging is necessary given greater than 1 year stability. Upper Abdomen: No acute abnormality. Musculoskeletal: No chest wall mass or suspicious bone lesions identified. IMPRESSION: 1. Total calcium score of 11.9 is at percentile 41 for subjects of the same age, gender, and race/ethnicity. 2. Mild aortic Atherosclerosis (ICD10-I70.0). Electronically Signed   By: Rea Marc M.D.   On: 10/29/2022 20:41    Assessment & Plan:  Type II diabetes mellitus with manifestations (HCC)- Blood sugar is well controlled. -     Microalbumin / creatinine urine ratio; Future -     Urinalysis, Routine w reflex microscopic; Future -     Hemoglobin A1c; Future -     Basic metabolic panel with GFR; Future -     HM DIABETES FOOT EXAM -     metFORMIN  HCl ER; Take 1 tablet (750 mg total) by mouth daily with breakfast.  Dispense: 90 tablet; Refill: 1  Encounter for general adult medical examination with abnormal findings- Exam completed, labs reviewed, vaccines reviewed, cancer screenings addressed, pt ed material was given.  -     PSA; Future  Essential hypertension- BP is well controlled. -     Urinalysis, Routine w reflex microscopic; Future -     CBC with Differential/Platelet; Future -     Basic metabolic panel with GFR; Future  Type 2 diabetes mellitus with unspecified complications (HCC)     Follow-up: Return in about 6 months (around 05/14/2024).  Debby Molt, MD

## 2023-11-12 NOTE — Patient Instructions (Signed)
 Health Maintenance, Male  Adopting a healthy lifestyle and getting preventive care are important in promoting health and wellness. Ask your health care provider about:  The right schedule for you to have regular tests and exams.  Things you can do on your own to prevent diseases and keep yourself healthy.  What should I know about diet, weight, and exercise?  Eat a healthy diet    Eat a diet that includes plenty of vegetables, fruits, low-fat dairy products, and lean protein.  Do not eat a lot of foods that are high in solid fats, added sugars, or sodium.  Maintain a healthy weight  Body mass index (BMI) is a measurement that can be used to identify possible weight problems. It estimates body fat based on height and weight. Your health care provider can help determine your BMI and help you achieve or maintain a healthy weight.  Get regular exercise  Get regular exercise. This is one of the most important things you can do for your health. Most adults should:  Exercise for at least 150 minutes each week. The exercise should increase your heart rate and make you sweat (moderate-intensity exercise).  Do strengthening exercises at least twice a week. This is in addition to the moderate-intensity exercise.  Spend less time sitting. Even light physical activity can be beneficial.  Watch cholesterol and blood lipids  Have your blood tested for lipids and cholesterol at 62 years of age, then have this test every 5 years.  You may need to have your cholesterol levels checked more often if:  Your lipid or cholesterol levels are high.  You are older than 62 years of age.  You are at high risk for heart disease.  What should I know about cancer screening?  Many types of cancers can be detected early and may often be prevented. Depending on your health history and family history, you may need to have cancer screening at various ages. This may include screening for:  Colorectal cancer.  Prostate cancer.  Skin cancer.  Lung  cancer.  What should I know about heart disease, diabetes, and high blood pressure?  Blood pressure and heart disease  High blood pressure causes heart disease and increases the risk of stroke. This is more likely to develop in people who have high blood pressure readings or are overweight.  Talk with your health care provider about your target blood pressure readings.  Have your blood pressure checked:  Every 3-5 years if you are 24-52 years of age.  Every year if you are 3 years old or older.  If you are between the ages of 60 and 72 and are a current or former smoker, ask your health care provider if you should have a one-time screening for abdominal aortic aneurysm (AAA).  Diabetes  Have regular diabetes screenings. This checks your fasting blood sugar level. Have the screening done:  Once every three years after age 66 if you are at a normal weight and have a low risk for diabetes.  More often and at a younger age if you are overweight or have a high risk for diabetes.  What should I know about preventing infection?  Hepatitis B  If you have a higher risk for hepatitis B, you should be screened for this virus. Talk with your health care provider to find out if you are at risk for hepatitis B infection.  Hepatitis C  Blood testing is recommended for:  Everyone born from [birth year] through 1964.  Anyone  with known risk factors for hepatitis C.  Sexually transmitted infections (STIs)  You should be screened each year for STIs, including gonorrhea and chlamydia, if:  You are sexually active and are younger than 62 years of age.  You are older than 62 years of age and your health care provider tells you that you are at risk for this type of infection.  Your sexual activity has changed since you were last screened, and you are at increased risk for chlamydia or gonorrhea. Ask your health care provider if you are at risk.  Ask your health care provider about whether you are at high risk for HIV. Your health care provider  may recommend a prescription medicine to help prevent HIV infection. If you choose to take medicine to prevent HIV, you should first get tested for HIV. You should then be tested every 3 months for as long as you are taking the medicine.  Follow these instructions at home:  Alcohol use  Do not drink alcohol if your health care provider tells you not to drink.  If you drink alcohol:  Limit how much you have to 0-2 drinks a day.  Know how much alcohol is in your drink. In the [place] one drink equals one 12 oz bottle of beer (355 mL), one 5 oz glass of wine (148 mL), or one 1 oz glass of hard liquor (44 mL).  Lifestyle  Do not use any products that contain nicotine or tobacco. These products include cigarettes, chewing tobacco, and vaping devices, such as e-cigarettes. If you need help quitting, ask your health care provider.  Do not use street drugs.  Do not share needles.  Ask your health care provider for help if you need support or information about quitting drugs.  General instructions  Schedule regular health, dental, and eye exams.  Stay current with your vaccines.  Tell your health care provider if:  You often feel depressed.  You have ever been abused or do not feel safe at home.  Summary  Adopting a healthy lifestyle and getting preventive care are important in promoting health and wellness.  Follow your health care provider's instructions about healthy diet, exercising, and getting tested or screened for diseases.  Follow your health care provider's instructions on monitoring your cholesterol and blood pressure.  This information is not intended to replace advice given to you by your health care provider. Make sure you discuss any questions you have with your health care provider.  Document Revised: 07/31/2020 Document Reviewed: 07/31/2020  Elsevier Patient Education  2024 [employer].

## 2023-11-13 ENCOUNTER — Ambulatory Visit: Payer: Self-pay | Admitting: Internal Medicine

## 2023-12-09 ENCOUNTER — Telehealth: Payer: Self-pay

## 2023-12-09 ENCOUNTER — Other Ambulatory Visit: Payer: Self-pay | Admitting: Internal Medicine

## 2023-12-09 ENCOUNTER — Other Ambulatory Visit (HOSPITAL_COMMUNITY): Payer: Self-pay

## 2023-12-09 DIAGNOSIS — F5104 Psychophysiologic insomnia: Secondary | ICD-10-CM

## 2023-12-09 NOTE — Telephone Encounter (Signed)
 Pharmacy Patient Advocate Encounter  Received notification from [employer] that Prior Authorization for  Icosapent  Ethyl 1GM capsules  has been APPROVED from 12/09/23 to 03/24/38. Ran test claim, Copay is $8. This test claim was processed through [facility]- copay amounts may vary at other pharmacies due to pharmacy/plan contracts, or as the patient moves through the different stages of their insurance plan.   PA #/Case ID/Reference #: [id number]

## 2023-12-09 NOTE — Telephone Encounter (Signed)
 Pharmacy Patient Advocate Encounter   Received notification from [employer] that prior authorization for Icosapent  Ethyl 1GM capsules  is required/requested.   Insurance verification completed.   The patient is insured through [facility] .   Per test claim: PA required; PA submitted to above mentioned insurance via Latent Key/confirmation #/EOC [id number] Status is pending

## 2023-12-26 LAB — OPHTHALMOLOGY REPORT-SCANNED

## 2024-01-08 ENCOUNTER — Other Ambulatory Visit: Payer: Self-pay | Admitting: Internal Medicine

## 2024-01-08 DIAGNOSIS — E785 Hyperlipidemia, unspecified: Secondary | ICD-10-CM

## 2024-01-29 ENCOUNTER — Other Ambulatory Visit: Payer: Self-pay | Admitting: Internal Medicine

## 2024-01-29 DIAGNOSIS — I10 Essential (primary) hypertension: Secondary | ICD-10-CM

## 2024-01-30 ENCOUNTER — Other Ambulatory Visit: Payer: Self-pay | Admitting: Internal Medicine

## 2024-01-30 DIAGNOSIS — I10 Essential (primary) hypertension: Secondary | ICD-10-CM

## 2024-01-30 NOTE — Telephone Encounter (Unsigned)
 Copied from CRM #[id number]. Topic: Clinical - Medication Refill >> Jan 30, 2024  2:28 PM Paige D wrote: Medication: Azilsartan-Chlorthalidone  (EDARBYCLOR ) 40-25 MG TABS  Has the patient contacted their pharmacy? Yes (Agent: If no, request that the patient contact the pharmacy for the refill. If patient does not wish to contact the pharmacy document the reason why and proceed with request.) (Agent: If yes, when and what did the pharmacy advise?)  This is the patient's preferred pharmacy:  [facility] [place], [place] - [address] [address] [place] [address] Phone: [phone] Fax: [phone]  Is this the correct pharmacy for this prescription? Yes If no, delete pharmacy and type the correct one.   Has the prescription been filled recently? Yes  Is the patient out of the medication? Yes  Has the patient been seen for an appointment in the last year OR does the patient have an upcoming appointment? Yes  Can we respond through MyChart? No phone call   Agent: Please be advised that Rx refills may take up to 3 business days. We ask that you follow-up with your pharmacy.

## 2024-02-03 ENCOUNTER — Other Ambulatory Visit: Payer: Self-pay

## 2024-02-03 ENCOUNTER — Ambulatory Visit: Payer: Self-pay

## 2024-02-03 DIAGNOSIS — I10 Essential (primary) hypertension: Secondary | ICD-10-CM

## 2024-02-03 MED ORDER — EDARBYCLOR 40-25 MG PO TABS: 1.0000 | ORAL_TABLET | Freq: Every day | ORAL | 1 refills | Status: AC

## 2024-02-03 NOTE — Telephone Encounter (Signed)
 Medication has been refilled and patient has been made aware.

## 2024-02-03 NOTE — Telephone Encounter (Signed)
 This RN made first attempt to reach patient's wife, LVM with call back number.   Copied from CRM [id number]. Topic: Clinical - Medication Question >> Feb 03, 2024  8:49 AM Ahlexyia S wrote: Reason for CRM: Pt wife Charlies called in wanting to check the status of medication Azilsartan-Chlorthalidone  (EDARBYCLOR ) 40-25 MG TABS. Per pt chart the med refill request was only routed and there hasn't been any updated information. Charlies is wanting to be contacted when this is done due to pt currently being out of medication and having to wait days to receive through mail order once pharmacy receives medication to fill.

## 2024-02-03 NOTE — Telephone Encounter (Signed)
 FYI Only or Action Required?: Action required by provider: medication refill request.  Patient was last seen in primary care on 11/12/2023 by Joshua Debby CROME, MD.  Called Nurse Triage reporting Medication Problem.  Triage Disposition: Call PCP Now  Patient/caregiver understands and will follow disposition?: Yes  **See note below**          Copied from CRM #[id number]. Topic: Clinical - Medication Question >> Feb 03, 2024  8:49 AM Ahlexyia S wrote: Reason for CRM: Pt wife Charlies called in wanting to check the status of medication Azilsartan-Chlorthalidone  (EDARBYCLOR ) 40-25 MG TABS. Per pt chart the med refill request was only routed and there hasn't been any updated information. Charlies is wanting to be contacted when this is done due to pt currently being out of medication and having to wait days to receive through mail order once pharmacy receives medication to fill. Reason for Disposition  [1] Prescription refill request for ESSENTIAL medicine (i.e., likelihood of harm to patient if not taken) AND [2] triager unable to refill per department policy  Answer Assessment - Initial Assessment Questions 1. DRUG NAME: What medicine do you need to have refilled?   Patients wife is wanting an update on the refill status of the Azilsartan-Chlorthalidone  (EDARBYCLOR ) 40-25 MG TABS; patient is currently out of the medication, and since they use a mail order pharmacy she is concerned about the length of time patient will be out of the medication. A refill request has been sent previously. Please advise.  Protocols used: Medication Refill and Renewal Call-A-AH

## 2024-03-05 ENCOUNTER — Other Ambulatory Visit: Payer: Self-pay | Admitting: Internal Medicine

## 2024-03-05 DIAGNOSIS — F5104 Psychophysiologic insomnia: Secondary | ICD-10-CM

## 2024-03-10 ENCOUNTER — Ambulatory Visit (HOSPITAL_BASED_OUTPATIENT_CLINIC_OR_DEPARTMENT_OTHER): Admit: 2024-03-10 | Discharge: 2024-03-10 | Disposition: A | Discharge status: Home / Self Care | Admitting: Radiology

## 2024-03-10 ENCOUNTER — Ambulatory Visit (HOSPITAL_BASED_OUTPATIENT_CLINIC_OR_DEPARTMENT_OTHER): Payer: Self-pay | Admitting: Family Medicine

## 2024-03-10 ENCOUNTER — Other Ambulatory Visit (HOSPITAL_BASED_OUTPATIENT_CLINIC_OR_DEPARTMENT_OTHER): Payer: Self-pay

## 2024-03-10 ENCOUNTER — Encounter (HOSPITAL_BASED_OUTPATIENT_CLINIC_OR_DEPARTMENT_OTHER): Payer: Self-pay

## 2024-03-10 ENCOUNTER — Ambulatory Visit (HOSPITAL_BASED_OUTPATIENT_CLINIC_OR_DEPARTMENT_OTHER)
Admission: EM | Admit: 2024-03-10 | Discharge: 2024-03-10 | Disposition: A | Discharge status: Home / Self Care | Attending: Family Medicine | Admitting: Family Medicine

## 2024-03-10 DIAGNOSIS — R051 Acute cough: Secondary | ICD-10-CM

## 2024-03-10 DIAGNOSIS — R509 Fever, unspecified: Secondary | ICD-10-CM

## 2024-03-10 DIAGNOSIS — J208 Acute bronchitis due to other specified organisms: Secondary | ICD-10-CM | POA: Diagnosis not present

## 2024-03-10 MED ORDER — ALBUTEROL SULFATE HFA 108 (90 BASE) MCG/ACT IN AERS
2.0000 | INHALATION_SPRAY | RESPIRATORY_TRACT | 0 refills | Status: DC | PRN
  Filled 2024-03-10: qty 6.7, 16d supply, fill #0

## 2024-03-10 MED ORDER — PREDNISONE 20 MG PO TABS
20.0000 mg | ORAL_TABLET | Freq: Every day | ORAL | 0 refills | Status: AC
  Filled 2024-03-10: qty 5, 5d supply, fill #0

## 2024-03-10 MED ORDER — PROMETHAZINE-DM 6.25-15 MG/5ML PO SYRP
5.0000 mL | ORAL_SOLUTION | Freq: Four times a day (QID) | ORAL | 0 refills | Status: DC | PRN
  Filled 2024-03-10: qty 118, 6d supply, fill #0

## 2024-03-10 MED FILL — Spacer/Aerosol-Holding Chambers - Device: 30 days supply | Qty: 1 | Fill #0 | Status: AC

## 2024-03-10 NOTE — Discharge Instructions (Addendum)
 Acute viral bronchitis with fever and cough: Chest x-ray appeared negative for pneumonia.  Patient has had symptoms for approximately 30 to 36 hours.  Encouraged to retest for influenza type A and B tonight or tomorrow morning.  If he is positive for influenza A or B, I we will call in Tamiflu if he lets me know.  Get plenty of fluids and rest.  Albuterol  inhaler with spacer, 2 puffs, every 4 hours if needed for wheezing.  Promethazine  DM, 5 mL, every 6 hours if needed for cough.  See below for signs and symptoms of worsening condition and reasons to go to an emergency room.  Declined work excuse.  Follow-up as needed.

## 2024-03-10 NOTE — ED Triage Notes (Signed)
 Pt states he started to have a cough yesterday. Denies nasal congestion, fever, sore throat, and body aches. He has taken mucinex  last with with slight relief. He did an at home covid and flu test this morning- both neg.

## 2024-03-10 NOTE — Progress Notes (Signed)
 Chest x-ray is negative.  Patient was given this same report during the visit.  No change in the plan of care

## 2024-03-10 NOTE — ED Provider Notes (Signed)
 [facility]    CSN: [id number] Arrival date & time: 03/10/24  0807      History   Chief Complaint Chief Complaint  Patient presents with   Cough    HPI Devin Roberts is a 63 y.o. male.   64 year old male who reported cough, nasal congestion that started on the night of 03/08/2024.  The cough got worse on 03/09/2024 and he developed some, wet sounding to his cough.  His wife is concerned about possible pneumonia.  He did a home flu COVID test morning and it was negative.  He denies fever, sore throat, body aches.   Cough Associated symptoms: fever (Low-grade fever here in the office) and rhinorrhea   Associated symptoms: no chest pain, no chills, no ear pain, no rash and no sore throat     Past Medical History:  Diagnosis Date   Acute asthmatic bronchitis    Allergic rhinitis    Chronic insomnia    Chronic kidney disease    kidney stone   COVID-19    Diabetes mellitus    DJD (degenerative joint disease)    GERD (gastroesophageal reflux disease)    Headache(784.0)    Hypercholesterolemia    Hypertension    Obesity     Patient Active Problem List   Diagnosis Date Noted   Encounter for general adult medical examination with abnormal findings 11/12/2023   Immunization due 05/06/2023   Exposure to second hand smoke 05/06/2023   Need for prophylactic vaccination and inoculation against varicella 05/06/2023   Chronic idiopathic constipation 12/07/2018   Pure hypertriglyceridemia 11/17/2018   Vitamin D  deficiency 11/13/2017   Low back pain at multiple sites 04/03/2017   Snoring 01/06/2014   Type II diabetes mellitus with manifestations (HCC) 03/10/2012   Insomnia 03/10/2012   Hyperlipidemia with target LDL less than 100 01/31/2007   Obesity 01/31/2007   Essential hypertension 01/31/2007   Allergic rhinitis 01/31/2007   GERD 01/31/2007    Past Surgical History:  Procedure Laterality Date   HARDWARE REMOVAL Left 11/20/2021   Procedure: HARDWARE  REMOVAL;  Surgeon: Celena Sharper, MD;  Location: [facility] OR;  Service: Orthopedics;  Laterality: Left;   HARDWARE REMOVAL Left 08/08/2022   Procedure: REMOVAL OF HARDWARE LEFT TIBIA;  Surgeon: Celena Sharper, MD;  Location: [facility] OR;  Service: Orthopedics;  Laterality: Left;   ORIF ANKLE FRACTURE Left 05/28/2021   Procedure: OPEN REDUCTION INTERNAL FIXATION (ORIF) TIBIA/FIBULA FRACTURE;  Surgeon: Celena Sharper, MD;  Location: [facility] OR;  Service: Orthopedics;  Laterality: Left;   SHOULDER SURGERY     TIBIA IM NAIL INSERTION Left 11/20/2021   Procedure: INTRAMEDULLARY (IM) NAIL TIBIAL;  Surgeon: Celena Sharper, MD;  Location: [facility] OR;  Service: Orthopedics;  Laterality: Left;       Home Medications    Prior to Admission medications  Medication Sig Start Date End Date Taking? Authorizing Provider  albuterol  (VENTOLIN  HFA) 108 (90 Base) MCG/ACT inhaler Inhale 2 puffs into the lungs every 4 (four) hours as needed for wheezing or shortness of breath. 03/10/24  Yes Ival Domino, FNP  predniSONE  (DELTASONE ) 20 MG tablet Take 1 tablet (20 mg total) by mouth daily with breakfast for 5 days. 03/10/24 03/15/24 Yes Ival Domino, FNP  promethazine -dextromethorphan  (PROMETHAZINE -DM) 6.25-15 MG/5ML syrup Take 5 mLs by mouth 4 (four) times daily as needed for cough. Do not use and drive - May make drowsy. 03/10/24  Yes Ival Domino, FNP  Spacer/Aero-Holding Chambers (COMPACT SPACE CHAMBER) DEVI Use with the albuterol  inhaler 03/10/24  Yes Ival Domino, FNP  acetaminophen  (TYLENOL ) 500 MG tablet Take 1-2 tablets (500-1,000 mg total) by mouth every 6 (six) hours as needed for moderate pain. 11/20/21   Deward Eck, PA-C  Azilsartan-Chlorthalidone  (EDARBYCLOR ) 40-25 MG TABS Take 1 tablet by mouth daily. 02/03/24   Joshua Debby CROME, MD  docusate sodium  (COLACE) 100 MG capsule TAKE ONE CAPSULE BY MOUTH TWICE DAILY 11/12/21   Joshua Debby CROME, MD  ezetimibe  (ZETIA ) 10 MG tablet TAKE ONE TABLET BY MOUTH DAILY 01/09/24   Joshua Debby CROME, MD  fexofenadine (ALLEGRA) 180 MG tablet Take 180 mg by mouth daily as needed for allergies or rhinitis.    [provider]  meloxicam  (MOBIC ) 15 MG tablet Take 15 mg by mouth as needed for pain.    [provider]  metFORMIN  (GLUCOPHAGE -XR) 750 MG 24 hr tablet Take 1 tablet (750 mg total) by mouth daily with breakfast. 11/12/23   Joshua Debby CROME, MD  Multiple Vitamin (MULTIVITAMIN WITH MINERALS) TABS tablet Take 1 tablet by mouth daily.    [provider]  OVER THE COUNTER MEDICATION Take by mouth daily in the afternoon. Balance of nature fruits and veggies. ( 2 tablets daily )    [provider]  OVER THE COUNTER MEDICATION daily. Total Beets    [provider]  pravastatin  (PRAVACHOL ) 40 MG tablet Take 1 tablet (40 mg total) by mouth at bedtime. 08/06/23   Joshua Debby CROME, MD  VASCEPA  1 g capsule TAKE TWO CAPSULES BY MOUTH TWICE DAILY 09/09/23   Joshua Debby CROME, MD  zolpidem  (AMBIEN ) 10 MG tablet TAKE ONE TABLET BY MOUTH AT BEDTIME 03/06/24   Joshua Debby CROME, MD    Family History Family History  Problem Relation Age of Onset   Hyperlipidemia Mother    COPD Father    Lung cancer Father    Cancer Neg Hx    Diabetes Neg Hx    Heart disease Neg Hx    Hypertension Neg Hx    Kidney disease Neg Hx    Stroke Neg Hx    Colon cancer Neg Hx    Colon polyps Neg Hx    Stomach cancer Neg Hx    Rectal cancer Neg Hx     Social History Social History[1]   Allergies   Codeine, Lisinopril, and Simvastatin   Review of Systems Review of Systems  Constitutional:  Positive for fever (Low-grade fever here in the office). Negative for chills.  HENT:  Positive for congestion, postnasal drip and rhinorrhea. Negative for ear pain and sore throat.   Eyes:  Negative for pain and visual disturbance.  Respiratory:  Positive for cough.   Cardiovascular:  Negative for chest pain and palpitations.  Gastrointestinal:  Negative for abdominal pain,  constipation, diarrhea, nausea and vomiting.  Genitourinary:  Negative for dysuria and hematuria.  Musculoskeletal:  Negative for arthralgias and back pain.  Skin:  Negative for color change and rash.  Neurological:  Negative for seizures and syncope.  All other systems reviewed and are negative.    Physical Exam Triage Vital Signs ED Triage Vitals  Encounter Vitals Group     BP 03/10/24 0819 129/86     Girls Systolic BP Percentile --      Girls Diastolic BP Percentile --      Boys Systolic BP Percentile --      Boys Diastolic BP Percentile --      Pulse Rate 03/10/24 0819 99     Resp 03/10/24 0819  20     Temp 03/10/24 0819 99.6 F (37.6 C)     Temp Source 03/10/24 0819 Oral     SpO2 03/10/24 0819 93 %     Weight --      Height --      Head Circumference --      Peak Flow --      Pain Score 03/10/24 0817 0     Pain Loc --      Pain Education --      Exclude from Growth Chart --    No data found.  Updated Vital Signs BP 129/86 (BP Location: Left Arm)   Pulse 99   Temp 99.6 F (37.6 C) (Oral)   Resp 20   SpO2 93%   Visual Acuity Right Eye Distance:   Left Eye Distance:   Bilateral Distance:    Right Eye Near:   Left Eye Near:    Bilateral Near:     Physical Exam Vitals and nursing note reviewed.  Constitutional:      General: He is not in acute distress.    Appearance: He is well-developed. He is not ill-appearing or toxic-appearing.  HENT:     Head: Normocephalic and atraumatic.     Right Ear: Hearing, tympanic membrane, ear canal and external ear normal.     Left Ear: Hearing, tympanic membrane, ear canal and external ear normal.     Nose: No congestion or rhinorrhea.     Right Sinus: No maxillary sinus tenderness or frontal sinus tenderness.     Left Sinus: No maxillary sinus tenderness or frontal sinus tenderness.     Mouth/Throat:     Lips: Pink.     Mouth: Mucous membranes are moist.     Pharynx: Uvula midline. No oropharyngeal exudate or  posterior oropharyngeal erythema.     Tonsils: No tonsillar exudate.  Eyes:     Conjunctiva/sclera: Conjunctivae normal.     Pupils: Pupils are equal, round, and reactive to light.  Cardiovascular:     Rate and Rhythm: Normal rate and regular rhythm.     Heart sounds: S1 normal and S2 normal. No murmur heard. Pulmonary:     Effort: Pulmonary effort is normal. No respiratory distress.     Breath sounds: Normal breath sounds. No decreased breath sounds, wheezing, rhonchi or rales.     Comments: Lungs are clear throughout with no wheezing.  Oxygen saturation is 91-93% on room air. Abdominal:     General: Bowel sounds are normal.     Palpations: Abdomen is soft.     Tenderness: There is no abdominal tenderness.  Musculoskeletal:        General: No swelling.     Cervical back: Neck supple.  Lymphadenopathy:     Head:     Right side of head: No submental, submandibular, tonsillar, preauricular or posterior auricular adenopathy.     Left side of head: No submental, submandibular, tonsillar, preauricular or posterior auricular adenopathy.     Cervical: No cervical adenopathy.     Right cervical: No superficial cervical adenopathy.    Left cervical: No superficial cervical adenopathy.  Skin:    General: Skin is warm and dry.     Capillary Refill: Capillary refill takes less than 2 seconds.     Findings: No rash.  Neurological:     Mental Status: He is alert and oriented to person, place, and time.  Psychiatric:        Mood and Affect: Mood normal.  UC Treatments / Results  Labs (all labs ordered are listed, but only abnormal results are displayed) Labs Reviewed - No data to display  EKG   Radiology No results found.  Procedures Procedures (including critical care time)  Medications Ordered in UC Medications - No data to display  Initial Impression / Assessment and Plan / UC Course  I have reviewed the triage vital signs and the nursing notes.  Pertinent labs &  imaging results that were available during my care of the patient were reviewed by me and considered in my medical decision making (see chart for details).  Plan of Care (see discharge instructions for additional patient precautions and education): Acute viral bronchitis with fever and cough:  Chest x-ray appeared negative for pneumonia.   Patient has had symptoms for approximately 30 to 36 hours.  Encouraged to retest for influenza type A and B tonight or tomorrow morning.  If he is positive for influenza A or B, I we will call in Tamiflu if he lets me know.   Get plenty of fluids and rest.   Albuterol  inhaler with spacer, 2 puffs, every 4 hours if needed for wheezing.   Promethazine  DM, 5 mL, every 6 hours if needed for cough. See discharge instructions for signs and symptoms of worsening condition and reasons to go to an emergency room.   Declined work excuse.   Follow-up as needed.  I reviewed the plan of care with the patient and/or the patient's guardian.  The patient and/or guardian had time to ask questions and acknowledged that the questions were answered.  Final Clinical Impressions(s) / UC Diagnoses   Final diagnoses:  Acute cough  Fever, unspecified  Acute viral bronchitis     Discharge Instructions      Acute viral bronchitis with fever and cough: Chest x-ray appeared negative for pneumonia.  Patient has had symptoms for approximately 30 to 36 hours.  Encouraged to retest for influenza type A and B tonight or tomorrow morning.  If he is positive for influenza A or B, I we will call in Tamiflu if he lets me know.  Get plenty of fluids and rest.  Albuterol  inhaler with spacer, 2 puffs, every 4 hours if needed for wheezing.  Promethazine  DM, 5 mL, every 6 hours if needed for cough.  See below for signs and symptoms of worsening condition and reasons to go to an emergency room.  Declined work excuse.  Follow-up as needed.     ED Prescriptions     Medication Sig Dispense  Auth. Provider   promethazine -dextromethorphan  (PROMETHAZINE -DM) 6.25-15 MG/5ML syrup Take 5 mLs by mouth 4 (four) times daily as needed for cough. Do not use and drive - May make drowsy. 118 mL Ival Domino, FNP   predniSONE  (DELTASONE ) 20 MG tablet Take 1 tablet (20 mg total) by mouth daily with breakfast for 5 days. 5 tablet Cyler Kappes, FNP   albuterol  (VENTOLIN  HFA) 108 (90 Base) MCG/ACT inhaler Inhale 2 puffs into the lungs every 4 (four) hours as needed for wheezing or shortness of breath. 1 each Ival Domino, FNP   Spacer/Aero-Holding Chambers (COMPACT SPACE CHAMBER) DEVI Use with the albuterol  inhaler 1 each Ival Domino, FNP      PDMP not reviewed this encounter.    [1]  Social History Tobacco Use   Smoking status: Never    Passive exposure: Past   Smokeless tobacco: Never  Vaping Use   Vaping status: Never Used  Substance Use Topics   Alcohol  use: No   Drug use: No     Ival Domino, FNP 03/10/24 1000

## 2024-03-19 ENCOUNTER — Encounter (HOSPITAL_BASED_OUTPATIENT_CLINIC_OR_DEPARTMENT_OTHER): Payer: Self-pay

## 2024-03-19 ENCOUNTER — Ambulatory Visit (HOSPITAL_BASED_OUTPATIENT_CLINIC_OR_DEPARTMENT_OTHER): Payer: Self-pay | Admitting: Family Medicine

## 2024-03-19 ENCOUNTER — Other Ambulatory Visit (HOSPITAL_BASED_OUTPATIENT_CLINIC_OR_DEPARTMENT_OTHER): Payer: Self-pay

## 2024-03-19 ENCOUNTER — Other Ambulatory Visit: Payer: Self-pay | Admitting: Internal Medicine

## 2024-03-19 ENCOUNTER — Ambulatory Visit (HOSPITAL_BASED_OUTPATIENT_CLINIC_OR_DEPARTMENT_OTHER): Admitting: Radiology

## 2024-03-19 ENCOUNTER — Ambulatory Visit
Admission: RE | Admit: 2024-03-19 | Discharge: 2024-03-19 | Disposition: A | Discharge status: Home / Self Care | Payer: Self-pay | Source: Ambulatory Visit | Attending: Family Medicine | Admitting: Family Medicine

## 2024-03-19 VITALS — BP 173/98 | HR 93 | Temp 98.5°F | Resp 16

## 2024-03-19 DIAGNOSIS — K1121 Acute sialoadenitis: Secondary | ICD-10-CM | POA: Diagnosis not present

## 2024-03-19 DIAGNOSIS — R059 Cough, unspecified: Secondary | ICD-10-CM | POA: Diagnosis not present

## 2024-03-19 DIAGNOSIS — R051 Acute cough: Secondary | ICD-10-CM

## 2024-03-19 MED ORDER — AMOXICILLIN-POT CLAVULANATE 875-125 MG PO TABS
1.0000 | ORAL_TABLET | Freq: Two times a day (BID) | ORAL | 0 refills | Status: AC
  Filled 2024-03-19: qty 14, 7d supply, fill #0

## 2024-03-19 NOTE — ED Triage Notes (Signed)
 Patient here today with c/o right side gland swelling in neck and coughing since Sunday. Patient has been taking medication for cough and congestion over the past week. Patient was seen 9 days ago. Denies ST. Patient finished taking Prednisone . Patient has taken Delsym  with some relief.

## 2024-03-19 NOTE — ED Provider Notes (Addendum)
 " [facility]    CSN: [id number] Arrival date & time: 03/19/24  0816      History   Chief Complaint Chief Complaint  Patient presents with   Cough    Still coughing after 2 weeks--swollen neck - Entered by patient    HPI Devin Roberts is a 63 y.o. male.   63 year old male who was seen on 03/10/2024.  At that time he reported cough, nasal congestion that started on the night of 03/08/2024.  The cough got worse on 03/09/2024 and he developed a wet sounding cough.  His wife is concerned about possible pneumonia.  He was negative for COVID and flu.  Due to the duration of his symptoms he was encouraged to test again.  He was treated with an albuterol  inhaler, prednisone  20 mg daily for 5 days and Promethazine  DM, 5 mL every 6 hours if needed for cough.  He is here today reporting that his cough has persisted and not gone away.  He has some swollen glands in his right neck.  He did complete the prednisone .  He is no longer using the albuterol  inhaler.  He did have a chest x-ray on 03/10/2024.  He denies fever, nausea, vomiting, constipation, diarrhea.  Delsym  has helped his cough some.   Cough Associated symptoms: no chest pain, no chills, no ear pain, no fever, no rash and no sore throat     Past Medical History:  Diagnosis Date   Acute asthmatic bronchitis    Allergic rhinitis    Chronic insomnia    Chronic kidney disease    kidney stone   COVID-19    Diabetes mellitus    DJD (degenerative joint disease)    GERD (gastroesophageal reflux disease)    Headache(784.0)    Hypercholesterolemia    Hypertension    Obesity     Patient Active Problem List   Diagnosis Date Noted   Encounter for general adult medical examination with abnormal findings 11/12/2023   Immunization due 05/06/2023   Exposure to second hand smoke 05/06/2023   Need for prophylactic vaccination and inoculation against varicella 05/06/2023   Chronic idiopathic constipation 12/07/2018   Pure  hypertriglyceridemia 11/17/2018   Vitamin D  deficiency 11/13/2017   Low back pain at multiple sites 04/03/2017   Snoring 01/06/2014   Type II diabetes mellitus with manifestations (HCC) 03/10/2012   Insomnia 03/10/2012   Hyperlipidemia with target LDL less than 100 01/31/2007   Obesity 01/31/2007   Essential hypertension 01/31/2007   Allergic rhinitis 01/31/2007   GERD 01/31/2007    Past Surgical History:  Procedure Laterality Date   HARDWARE REMOVAL Left 11/20/2021   Procedure: HARDWARE REMOVAL;  Surgeon: Celena Sharper, MD;  Location: [facility] OR;  Service: Orthopedics;  Laterality: Left;   HARDWARE REMOVAL Left 08/08/2022   Procedure: REMOVAL OF HARDWARE LEFT TIBIA;  Surgeon: Celena Sharper, MD;  Location: [facility] OR;  Service: Orthopedics;  Laterality: Left;   ORIF ANKLE FRACTURE Left 05/28/2021   Procedure: OPEN REDUCTION INTERNAL FIXATION (ORIF) TIBIA/FIBULA FRACTURE;  Surgeon: Celena Sharper, MD;  Location: [facility] OR;  Service: Orthopedics;  Laterality: Left;   SHOULDER SURGERY     TIBIA IM NAIL INSERTION Left 11/20/2021   Procedure: INTRAMEDULLARY (IM) NAIL TIBIAL;  Surgeon: Celena Sharper, MD;  Location: [facility] OR;  Service: Orthopedics;  Laterality: Left;       Home Medications    Prior to Admission medications  Medication Sig Start Date End Date Taking? Authorizing Provider  amoxicillin -clavulanate (AUGMENTIN ) 875-125 MG  tablet Take 1 tablet by mouth 2 (two) times daily after a meal for 7 days. 03/19/24 03/26/24 Yes Ival Domino, FNP  acetaminophen  (TYLENOL ) 500 MG tablet Take 1-2 tablets (500-1,000 mg total) by mouth every 6 (six) hours as needed for moderate pain. 11/20/21   Deward Eck, PA-C  Azilsartan-Chlorthalidone  (EDARBYCLOR ) 40-25 MG TABS Take 1 tablet by mouth daily. 02/03/24   Joshua Debby CROME, MD  ezetimibe  (ZETIA ) 10 MG tablet TAKE ONE TABLET BY MOUTH DAILY 01/09/24   Joshua Debby CROME, MD  fexofenadine (ALLEGRA) 180 MG tablet Take 180 mg by mouth daily as needed for allergies or  rhinitis.    [provider]  meloxicam  (MOBIC ) 15 MG tablet Take 15 mg by mouth as needed for pain.    [provider]  metFORMIN  (GLUCOPHAGE -XR) 750 MG 24 hr tablet Take 1 tablet (750 mg total) by mouth daily with breakfast. 11/12/23   Joshua Debby CROME, MD  Multiple Vitamin (MULTIVITAMIN WITH MINERALS) TABS tablet Take 1 tablet by mouth daily.    [provider]  OVER THE COUNTER MEDICATION daily. Total Beets    [provider]  pravastatin  (PRAVACHOL ) 40 MG tablet Take 1 tablet (40 mg total) by mouth at bedtime. 08/06/23   Joshua Debby CROME, MD  Spacer/Aero-Holding Chambers (COMPACT SPACE CHAMBER) DEVI Use with the albuterol  inhaler 03/10/24   Ival Domino, FNP  VASCEPA  1 g capsule TAKE TWO CAPSULES BY MOUTH TWICE DAILY 09/09/23   Joshua Debby CROME, MD  zolpidem  (AMBIEN ) 10 MG tablet TAKE ONE TABLET BY MOUTH AT BEDTIME 03/06/24   Joshua Debby CROME, MD    Family History Family History  Problem Relation Age of Onset   Hyperlipidemia Mother    COPD Father    Lung cancer Father    Cancer Neg Hx    Diabetes Neg Hx    Heart disease Neg Hx    Hypertension Neg Hx    Kidney disease Neg Hx    Stroke Neg Hx    Colon cancer Neg Hx    Colon polyps Neg Hx    Stomach cancer Neg Hx    Rectal cancer Neg Hx     Social History Social History[1]   Allergies   Codeine, Lisinopril, and Simvastatin   Review of Systems Review of Systems  Constitutional:  Negative for chills and fever.  HENT:  Negative for ear pain and sore throat.   Eyes:  Negative for pain and visual disturbance.  Respiratory:  Positive for cough.   Cardiovascular:  Negative for chest pain and palpitations.  Gastrointestinal:  Negative for abdominal pain, constipation, diarrhea, nausea and vomiting.  Genitourinary:  Negative for dysuria and hematuria.  Musculoskeletal:  Negative for arthralgias and back pain.  Skin:  Negative for color change and rash.  Neurological:  Negative for seizures and  syncope.  Hematological:  Positive for adenopathy.  All other systems reviewed and are negative.    Physical Exam Triage Vital Signs ED Triage Vitals [03/19/24 0823]  Encounter Vitals Group     BP      Girls Systolic BP Percentile      Girls Diastolic BP Percentile      Boys Systolic BP Percentile      Boys Diastolic BP Percentile      Pulse      Resp      Temp      Temp src      SpO2      Weight      Height  Head Circumference      Peak Flow      Pain Score 0     Pain Loc      Pain Education      Exclude from Growth Chart    No data found.  Updated Vital Signs BP (!) 173/98 (BP Location: Right Arm)   Pulse 93   Temp 98.5 F (36.9 C) (Oral)   Resp 16   SpO2 93%   Visual Acuity Right Eye Distance:   Left Eye Distance:   Bilateral Distance:    Right Eye Near:   Left Eye Near:    Bilateral Near:     Physical Exam Vitals and nursing note reviewed.  Constitutional:      General: He is not in acute distress.    Appearance: He is well-developed. He is not ill-appearing, toxic-appearing or diaphoretic.  HENT:     Head: Normocephalic and atraumatic.     Salivary Glands: Right salivary gland is diffusely enlarged. Right salivary gland is not tender. Left salivary gland is not diffusely enlarged or tender.      Right Ear: Hearing, tympanic membrane, ear canal and external ear normal.     Left Ear: Hearing, tympanic membrane, ear canal and external ear normal.     Nose: Congestion and rhinorrhea present. Rhinorrhea is clear.     Right Sinus: No maxillary sinus tenderness or frontal sinus tenderness.     Left Sinus: No maxillary sinus tenderness or frontal sinus tenderness.     Mouth/Throat:     Lips: Pink.     Mouth: Mucous membranes are moist.     Dentition: Normal dentition.     Pharynx: Uvula midline. No oropharyngeal exudate or posterior oropharyngeal erythema.     Tonsils: No tonsillar exudate.  Eyes:     Conjunctiva/sclera: Conjunctivae normal.      Pupils: Pupils are equal, round, and reactive to light.  Cardiovascular:     Rate and Rhythm: Normal rate and regular rhythm.     Heart sounds: S1 normal and S2 normal. No murmur heard. Pulmonary:     Effort: Pulmonary effort is normal. No respiratory distress.     Breath sounds: Normal breath sounds. No decreased breath sounds, wheezing, rhonchi or rales.     Comments: Lungs are clear on exam but he has a persistent cough and an O2 sat of 93%. Abdominal:     General: Bowel sounds are normal.     Palpations: Abdomen is soft.     Tenderness: There is no abdominal tenderness.  Musculoskeletal:        General: No swelling.     Cervical back: Neck supple.  Lymphadenopathy:     Head:     Right side of head: No submental, submandibular, tonsillar, preauricular or posterior auricular adenopathy.     Left side of head: No submental, submandibular, tonsillar, preauricular or posterior auricular adenopathy.     Cervical: Cervical adenopathy present.     Right cervical: Superficial cervical adenopathy present.     Left cervical: Superficial cervical adenopathy present.  Skin:    General: Skin is warm and dry.     Capillary Refill: Capillary refill takes less than 2 seconds.     Findings: No rash.  Neurological:     Mental Status: He is alert and oriented to person, place, and time.  Psychiatric:        Mood and Affect: Mood normal.      UC Treatments / Results  Labs (all labs  ordered are listed, but only abnormal results are displayed) Labs Reviewed - No data to display  EKG   Radiology DG Chest 2 View Result Date: 03/19/2024 EXAM: 2 VIEW(S) XRAY OF THE CHEST 03/19/2024 09:08:48 AM COMPARISON: 03/10/2024 CLINICAL HISTORY: cough FINDINGS: LUNGS AND PLEURA: No focal pulmonary opacity. No pleural effusion. No pneumothorax. HEART AND MEDIASTINUM: No acute abnormality of the cardiac and mediastinal silhouettes. BONES AND SOFT TISSUES: Thoracic degenerative changes. IMPRESSION: 1. No  acute process. Electronically signed by: Ryan Chess MD 03/19/2024 09:31 AM EST RP Workstation: [id number]    Procedures Procedures (including critical care time)  Medications Ordered in UC Medications - No data to display  Initial Impression / Assessment and Plan / UC Course  I have reviewed the triage vital signs and the nursing notes.  Pertinent labs & imaging results that were available during my care of the patient were reviewed by me and considered in my medical decision making (see chart for details).  Plan of Care (see discharge instructions for additional patient precautions and education): Parotitis on the right versus enlarged lymph node: Encouraged salt water gargles 4-6 times daily.  Encouraged gentle massage of the parotid gland.  Augmentin  875-125 mg, 1 pill twice daily for 7 days.  Give plenty of fluids and rest.  If this gland or lymph node does not resolve within 3 to 5 days of completion of the antibiotic, patient needs to see primary care and get a referral to ENT for further workup.  Cough: Lungs sound clear and no wheezing.  Oxygen saturation was 93% on room air.  Chest x-ray appears negative.  Patient has an albuterol  inhaler that he has stopped using.  If any persistent coughing or wheezing, restart the albuterol  inhaler at 2 puffs every 4 hours as needed.  If cough persists and does not resolve needs to see primary care and may need to see pulmonology.  Follow-up with primary care as needed return here as needed.  I reviewed the plan of care with the patient and/or the patient's guardian.  The patient and/or guardian had time to ask questions and acknowledged that the questions were answered.  Final Clinical Impressions(s) / UC Diagnoses   Final diagnoses:  Acute cough  Acute parotitis     Discharge Instructions      Parotitis on the right versus enlarged lymph node: Encouraged salt water gargles 4-6 times daily.  Encouraged gentle massage of the parotid  gland.  Augmentin  875-125 mg, 1 pill twice daily for 7 days.  Give plenty of fluids and rest.  If this gland or lymph node does not resolve within 3 to 5 days of completion of the antibiotic, patient needs to see primary care and get a referral to ENT for further workup.  Cough: Lungs sound clear and no wheezing.  Oxygen saturation was 93% on room air.  Chest x-ray appears negative.  Patient has an albuterol  inhaler that he has stopped using.  If any persistent coughing or wheezing, restart the albuterol  inhaler at 2 puffs every 4 hours as needed.  If cough persists and does not resolve needs to see primary care and may need to see pulmonology.  Follow-up with primary care as needed return here as needed.     ED Prescriptions     Medication Sig Dispense Auth. Provider   amoxicillin -clavulanate (AUGMENTIN ) 875-125 MG tablet Take 1 tablet by mouth 2 (two) times daily after a meal for 7 days. 14 tablet Jazlen Ogarro, FNP  PDMP not reviewed this encounter.    Ival Domino, FNP 03/19/24 0919     [1]  Social History Tobacco Use   Smoking status: Never    Passive exposure: Past   Smokeless tobacco: Never  Vaping Use   Vaping status: Never Used  Substance Use Topics   Alcohol use: No   Drug use: No     Ival Domino, FNP 03/19/24 1056  "

## 2024-03-19 NOTE — Discharge Instructions (Addendum)
 Parotitis on the right versus enlarged lymph node: Encouraged salt water gargles 4-6 times daily.  Encouraged gentle massage of the parotid gland.  Augmentin  875-125 mg, 1 pill twice daily for 7 days.  Give plenty of fluids and rest.  If this gland or lymph node does not resolve within 3 to 5 days of completion of the antibiotic, patient needs to see primary care and get a referral to ENT for further workup.  Cough: Lungs sound clear and no wheezing.  Oxygen saturation was 93% on room air.  Chest x-ray appears negative.  Patient has an albuterol  inhaler that he has stopped using.  If any persistent coughing or wheezing, restart the albuterol  inhaler at 2 puffs every 4 hours as needed.  If cough persists and does not resolve needs to see primary care and may need to see pulmonology.  Follow-up with primary care as needed return here as needed.

## 2024-03-19 NOTE — Progress Notes (Signed)
 Negative chest x-ray.  Patient was updated during the visit.

## 2024-04-06 ENCOUNTER — Other Ambulatory Visit: Payer: Self-pay | Admitting: Internal Medicine

## 2024-05-20 ENCOUNTER — Ambulatory Visit: Admitting: Internal Medicine
